# Patient Record
Sex: Male | Born: 1950
Health system: Southern US, Community
[De-identification: ages and names within clinical notes are randomized; demographics above are authoritative.]

## PROBLEM LIST (undated history)

## (undated) DIAGNOSIS — R7303 Prediabetes: Secondary | ICD-10-CM

## (undated) DIAGNOSIS — F411 Generalized anxiety disorder: Secondary | ICD-10-CM

## (undated) DIAGNOSIS — J449 Chronic obstructive pulmonary disease, unspecified: Secondary | ICD-10-CM

## (undated) DIAGNOSIS — I10 Essential (primary) hypertension: Secondary | ICD-10-CM

## (undated) DIAGNOSIS — K219 Gastro-esophageal reflux disease without esophagitis: Secondary | ICD-10-CM

## (undated) DIAGNOSIS — R7301 Impaired fasting glucose: Secondary | ICD-10-CM

## (undated) DIAGNOSIS — Z720 Tobacco use: Secondary | ICD-10-CM

## (undated) DIAGNOSIS — Z9981 Dependence on supplemental oxygen: Secondary | ICD-10-CM

## (undated) DIAGNOSIS — E785 Hyperlipidemia, unspecified: Secondary | ICD-10-CM

## (undated) HISTORY — PX: APPENDECTOMY: SHX54

## (undated) HISTORY — DX: Impaired fasting glucose: R73.01

## (undated) HISTORY — DX: Tobacco use: Z72.0

---

## 2003-04-27 ENCOUNTER — Emergency Department (HOSPITAL_COMMUNITY): Admission: EM | Admit: 2003-04-27 | Discharge: 2003-04-27 | Payer: Self-pay | Admitting: Emergency Medicine

## 2007-05-30 ENCOUNTER — Ambulatory Visit: Payer: Self-pay | Admitting: Cardiology

## 2011-04-02 DIAGNOSIS — R079 Chest pain, unspecified: Secondary | ICD-10-CM | POA: Insufficient documentation

## 2011-04-06 ENCOUNTER — Encounter: Payer: Self-pay | Admitting: *Deleted

## 2011-04-06 ENCOUNTER — Emergency Department (HOSPITAL_COMMUNITY)
Admission: EM | Admit: 2011-04-06 | Discharge: 2011-04-06 | Disposition: A | Discharge status: Home / Self Care | Payer: Self-pay | Attending: Emergency Medicine | Admitting: Emergency Medicine

## 2011-04-06 ENCOUNTER — Emergency Department (HOSPITAL_COMMUNITY): Payer: Self-pay

## 2011-04-06 DIAGNOSIS — J4489 Other specified chronic obstructive pulmonary disease: Secondary | ICD-10-CM | POA: Insufficient documentation

## 2011-04-06 DIAGNOSIS — J449 Chronic obstructive pulmonary disease, unspecified: Secondary | ICD-10-CM

## 2011-04-06 DIAGNOSIS — IMO0001 Reserved for inherently not codable concepts without codable children: Secondary | ICD-10-CM | POA: Insufficient documentation

## 2011-04-06 DIAGNOSIS — F17200 Nicotine dependence, unspecified, uncomplicated: Secondary | ICD-10-CM | POA: Insufficient documentation

## 2011-04-06 DIAGNOSIS — R6889 Other general symptoms and signs: Secondary | ICD-10-CM | POA: Insufficient documentation

## 2011-04-06 DIAGNOSIS — R05 Cough: Secondary | ICD-10-CM

## 2011-04-06 DIAGNOSIS — R509 Fever, unspecified: Secondary | ICD-10-CM | POA: Insufficient documentation

## 2011-04-06 DIAGNOSIS — R059 Cough, unspecified: Secondary | ICD-10-CM | POA: Insufficient documentation

## 2011-04-06 DIAGNOSIS — J3489 Other specified disorders of nose and nasal sinuses: Secondary | ICD-10-CM | POA: Insufficient documentation

## 2011-04-06 DIAGNOSIS — R070 Pain in throat: Secondary | ICD-10-CM | POA: Insufficient documentation

## 2011-04-06 MED ORDER — GUAIFENESIN-CODEINE 100-10 MG/5ML PO SYRP: ORAL_SOLUTION | ORAL | Status: DC

## 2011-04-06 MED ORDER — HYDROCOD POLST-CHLORPHEN POLST 10-8 MG/5ML PO LQCR
5.0000 mL | Freq: Once | ORAL | Status: AC
  Administered 2011-04-06: 5 mL via ORAL
  Filled 2011-04-06: qty 5

## 2011-04-06 NOTE — ED Notes (Signed)
Per wife, pt c/o cough and nasal congestion x 2 weeks; has been using OTC cold medication for this with no relief; c/o onset of chills and body aches yesterday.

## 2011-04-06 NOTE — ED Notes (Signed)
Pt states cold like symptoms started yesterday.  Denies N/V, fever and chills. Pt has a dry croupy type cough present. Reports having yellow sputum infrequently. Pt's wife states pt has had congestion and cold like symptoms x 2 weeks and taking OTC products without success.  Pt states has generalized body aches, denies pain.

## 2011-04-06 NOTE — ED Provider Notes (Signed)
History     CSN: [id number] Arrival date & time: 04/06/2011  4:07 PM   First MD Initiated Contact with Patient 04/06/11 1608      Chief Complaint  Patient presents with  . Generalized Body Aches  . Chills  . Cough    (Consider location/radiation/quality/duration/timing/severity/associated sxs/prior treatment) HPI Comments: Pt states he's had URI sxs x 3 weeks but developed a NP cough yest..  Also, mild sore throat from "coughing so much and diffuse body aches".  Tmax 99.9.  Patient is a 60 y.o. male presenting with cough. The history is provided by the patient. No language interpreter was used.  Cough This is a new problem. The current episode started yesterday. The problem occurs every few minutes. The problem has not changed since onset.The cough is non-productive. There has been no fever. Associated symptoms include rhinorrhea, sore throat and myalgias. Pertinent negatives include no weight loss, no shortness of breath and no wheezing. He has tried nothing for the symptoms. He is a smoker. His past medical history is significant for COPD and emphysema. His past medical history does not include bronchitis, pneumonia, bronchiectasis or asthma.    History reviewed. No pertinent past medical history.  Past Surgical History  Procedure Date  . Appendectomy     No family history on file.  History  Substance Use Topics  . Smoking status: Current Everyday Smoker -- 0.5 packs/day    Types: Cigarettes  . Smokeless tobacco: Not on file  . Alcohol Use: No      Review of Systems  Constitutional: Positive for fever. Negative for weight loss.  HENT: Positive for sore throat, rhinorrhea and sneezing.   Respiratory: Positive for cough. Negative for shortness of breath and wheezing.   Musculoskeletal: Positive for myalgias.  All other systems reviewed and are negative.    Allergies  Review of patient's allergies indicates no known allergies.  Home Medications   Current  Outpatient Rx  Name Route Sig Dispense Refill  . ONE-A-DAY MENS PO TABS Oral Take 1 tablet by mouth daily.      Marland Kitchen PHENYLEPH-DOXYL-DM-ASPIRIN 7.8-6.25-10-500 MG PO TBEF Oral Take 1 tablet by mouth daily as needed. For cold symptoms     . NYQUIL PO Oral Take 15 mLs by mouth at bedtime as needed. Four cold and cough       BP 151/96  Pulse 93  Temp(Src) 98.9 F (37.2 C) (Oral)  Resp 20  Ht 5' 6.5" (1.689 m)  Wt 120 lb (54.432 kg)  BMI 19.08 kg/m2  SpO2 98%  Physical Exam  Nursing note and vitals reviewed. Constitutional: He is oriented to person, place, and time. He appears well-developed and well-nourished.  HENT:  Head: Normocephalic and atraumatic.  Right Ear: Hearing and tympanic membrane normal.  Left Ear: Hearing and tympanic membrane normal.  Mouth/Throat: Oropharynx is clear and moist and mucous membranes are normal. Abnormal dentition. No dental abscesses or uvula swelling. No oropharyngeal exudate, posterior oropharyngeal edema, posterior oropharyngeal erythema or tonsillar abscesses.  Eyes: EOM are normal.  Neck: Normal range of motion.  Cardiovascular: Normal rate, regular rhythm, normal heart sounds and intact distal pulses.   Pulmonary/Chest: Effort normal and breath sounds normal. No accessory muscle usage. Not tachypneic. No respiratory distress. He has no decreased breath sounds. He has no wheezes. He has no rhonchi. He has no rales. He exhibits no tenderness.  Abdominal: Soft. He exhibits no distension. There is no tenderness.  Musculoskeletal: Normal range of motion.  Neurological: He is alert  and oriented to person, place, and time.  Skin: Skin is warm and dry.  Psychiatric: He has a normal mood and affect. Judgment normal.    ED Course  Procedures (including critical care time)  Labs Reviewed - No data to display Dg Chest 2 View  04/06/2011  *RADIOLOGY REPORT*  Clinical Data: Cough, smoker.  CHEST - 2 VIEW  Comparison: [place] study 03/27/2010  Findings:  There is hyperinflation of the lungs compatible with COPD.  Heart and mediastinal contours are within normal limits.  No focal opacities or effusions.  No acute bony abnormality.  IMPRESSION: COPD.  No active disease.  Original Report Authenticated By: Cyndie Chime, M.D.     No diagnosis found.    MDM          Worthy Rancher, PA 04/06/11 [phone]

## 2011-04-07 NOTE — ED Provider Notes (Signed)
Medical screening examination/treatment/procedure(s) were performed by non-physician practitioner and as supervising physician I was immediately available for consultation/collaboration.   Renne Platts L Marquiz Sotelo, MD 04/07/11 0002 

## 2011-04-11 ENCOUNTER — Encounter (HOSPITAL_COMMUNITY): Payer: Self-pay | Admitting: *Deleted

## 2011-04-11 ENCOUNTER — Other Ambulatory Visit: Payer: Self-pay

## 2011-04-11 ENCOUNTER — Emergency Department (HOSPITAL_COMMUNITY)
Admission: EM | Admit: 2011-04-11 | Discharge: 2011-04-12 | Disposition: A | Discharge status: Home / Self Care | Payer: Self-pay | Attending: Emergency Medicine | Admitting: Emergency Medicine

## 2011-04-11 DIAGNOSIS — R531 Weakness: Secondary | ICD-10-CM

## 2011-04-11 DIAGNOSIS — I10 Essential (primary) hypertension: Secondary | ICD-10-CM | POA: Insufficient documentation

## 2011-04-11 DIAGNOSIS — R5381 Other malaise: Secondary | ICD-10-CM | POA: Insufficient documentation

## 2011-04-11 DIAGNOSIS — J4489 Other specified chronic obstructive pulmonary disease: Secondary | ICD-10-CM | POA: Insufficient documentation

## 2011-04-11 DIAGNOSIS — M549 Dorsalgia, unspecified: Secondary | ICD-10-CM | POA: Insufficient documentation

## 2011-04-11 DIAGNOSIS — R109 Unspecified abdominal pain: Secondary | ICD-10-CM | POA: Insufficient documentation

## 2011-04-11 DIAGNOSIS — J449 Chronic obstructive pulmonary disease, unspecified: Secondary | ICD-10-CM | POA: Insufficient documentation

## 2011-04-11 DIAGNOSIS — R05 Cough: Secondary | ICD-10-CM | POA: Insufficient documentation

## 2011-04-11 DIAGNOSIS — R059 Cough, unspecified: Secondary | ICD-10-CM | POA: Insufficient documentation

## 2011-04-11 DIAGNOSIS — F17200 Nicotine dependence, unspecified, uncomplicated: Secondary | ICD-10-CM | POA: Insufficient documentation

## 2011-04-11 LAB — DIFFERENTIAL
Basophils Absolute: 0.1 10*3/uL (ref 0.0–0.1)
Basophils Relative: 1 % (ref 0–1)
Eosinophils Absolute: 0.1 10*3/uL (ref 0.0–0.7)
Eosinophils Relative: 2 % (ref 0–5)
Lymphocytes Relative: 32 % (ref 12–46)
Lymphs Abs: 1.8 10*3/uL (ref 0.7–4.0)
Monocytes Absolute: 0.7 10*3/uL (ref 0.1–1.0)
Monocytes Relative: 12 % (ref 3–12)
Neutro Abs: 2.9 10*3/uL (ref 1.7–7.7)
Neutrophils Relative %: 53 % (ref 43–77)

## 2011-04-11 LAB — BASIC METABOLIC PANEL
BUN: 13 mg/dL (ref 6–23)
CO2: 28 mEq/L (ref 19–32)
Calcium: 9.9 mg/dL (ref 8.4–10.5)
Chloride: 100 mEq/L (ref 96–112)
Creatinine, Ser: 0.69 mg/dL (ref 0.50–1.35)
GFR calc Af Amer: >90 mL/min (ref >90)
GFR calc non Af Amer: >90 mL/min (ref >90)
Glucose, Bld: 119 mg/dL — ABNORMAL HIGH (ref 70–99)
Potassium: 4.1 mEq/L (ref 3.5–5.1)
Sodium: 137 mEq/L (ref 135–145)

## 2011-04-11 LAB — POCT I-STAT TROPONIN I: Troponin i, poc: 0 ng/mL (ref 0.00–0.08)

## 2011-04-11 LAB — CBC
HCT: 42.1 % (ref 39.0–52.0)
Hemoglobin: 14.2 g/dL (ref 13.0–17.0)
MCH: 28.9 pg (ref 26.0–34.0)
MCHC: 33.7 g/dL (ref 30.0–36.0)
MCV: 85.6 fL (ref 78.0–100.0)
Platelets: 210 10*3/uL (ref 150–400)
RBC: 4.92 MIL/uL (ref 4.22–5.81)
RDW: 14.1 % (ref 11.5–15.5)
WBC: 5.6 10*3/uL (ref 4.0–10.5)

## 2011-04-11 MED ORDER — ALBUTEROL SULFATE HFA 108 (90 BASE) MCG/ACT IN AERS
2.0000 | INHALATION_SPRAY | RESPIRATORY_TRACT | Status: DC | PRN
  Administered 2011-04-12: 2 via RESPIRATORY_TRACT
  Filled 2011-04-11: qty 6.7

## 2011-04-11 NOTE — ED Notes (Signed)
Nausea, dizzy, onset today.

## 2011-04-11 NOTE — ED Provider Notes (Signed)
History   This chart was scribed for Joya Gaskins, MD scribed by Magnus Sinning. The patient was seen in room APA19/APA19    CSN: [id number] Arrival date & time: 04/11/2011  9:10 PM   First MD Initiated Contact with Patient 04/11/11 2130      Chief Complaint  Patient presents with  . Nausea    Patient is a 60 y.o. male presenting with abdominal pain. The history is provided by the patient and the spouse. No language interpreter was used.  Abdominal Pain The primary symptoms of the illness include abdominal pain. The current episode started more than 2 days ago. The onset of the illness was gradual. The problem has been gradually worsening.  Additional symptoms associated with the illness include back pain (Experienced w/ coughing).  Jeffery [name] is a 60 y.o. male who presents to the Emergency Department complaining of intermittitent abdominal pain associated w/ cough, loss of appetite, subjective fever,and light-headed dizziness, but reports that he still been able to drive and maintain activity. Pt reports that onset was five days ago after being seen in ED for a dry croupy cough. Pt was dx'd at last visit with COPD exacerbation and was prescribed codeine cough syrup.  No active CP reported  History reviewed. No pertinent past medical history. COPD  Past Surgical History  Procedure Date  . Appendectomy     History reviewed. No pertinent family history. History  Substance Use Topics  . Smoking status: Current Everyday Smoker -- 0.5 packs/day    Types: Cigarettes  . Smokeless tobacco: Not on file  . Alcohol Use: No     Review of Systems  Gastrointestinal: Positive for abdominal pain.  Musculoskeletal: Positive for back pain (Experienced w/ coughing).  Neurological: Negative for headaches.  All other systems reviewed and are negative.    Allergies  Review of patient's allergies indicates no known allergies.  Home Medications   Current Outpatient Rx  Name  Route Sig Dispense Refill  . GUAIFENESIN-CODEINE 100-10 MG/5ML PO SYRP  5-10 ml po q 4-6 hrs prn cough 240 mL 0  . ONE-A-DAY MENS PO TABS Oral Take 1 tablet by mouth daily.        BP 136/102  Pulse 73  Temp(Src) 98.2 F (36.8 C) (Oral)  Resp 16  Ht 5' 6.5" (1.689 m)  Wt 120 lb (54.432 kg)  BMI 19.08 kg/m2  SpO2 99%  Physical Exam CONSTITUTIONAL: Well developed/well nourished HEAD AND FACE: Normocephalic/atraumatic EYES: EOMI/PERRL ENMT: Mucous membranes moist NECK: supple no meningeal signs CV: S1/S2 noted, no murmurs/rubs/gallops noted LUNGS: Lungs are clear to auscultation bilaterally, no apparent distress ABDOMEN: soft, nontender, no rebound or guarding GU:no cva tenderness NEURO: Pt is awake/alert, moves all extremitiesx4 No focal motor deficit is noted EXTREMITIES: pulses normal, full ROM SKIN: warm, color normal PSYCH: no abnormalities of mood noted ED Course  Procedures  Oxygen Saturation is 99% on room air, normal by my interpretation.    COORDINATION OF CARE:  12:00 AM Pt improved He denied CP/diaphoresis He reports he felt nausea after taking the cough syrup with codeine, and he continued to cough He did have abnormal EKG, though no old to compare.  I offered him admission for his generalized weakness and abnormal EKG but he would rather go home.  He would like an albuterol mdi.  I advised him to quit smoking and f/u for his HTN.  I discussed strict return precautions He is agreeable with this plan   MDM  Nursing notes reviewed  and considered in documentation All labs/vitals reviewed and considered Previous records reviewed and considered   Date: 04/11/2011  Rate: 71  Rhythm: normal sinus rhythm  QRS Axis: normal  Intervals: normal  ST/T Wave abnormalities: inverted T waves  Conduction Disutrbances:none  Narrative Interpretation:   Old EKG Reviewed: none available   I personally performed the services described in this documentation, which was  scribed in my presence. The recorded information has been reviewed and considered.           Joya Gaskins, MD 04/12/11 [phone]

## 2011-04-12 ENCOUNTER — Other Ambulatory Visit: Payer: Self-pay

## 2011-04-12 ENCOUNTER — Encounter (HOSPITAL_COMMUNITY): Payer: Self-pay

## 2011-04-12 ENCOUNTER — Inpatient Hospital Stay (HOSPITAL_COMMUNITY)
Admission: EM | Admit: 2011-04-12 | Discharge: 2011-04-13 | DRG: 313 | Disposition: A | Discharge status: Home / Self Care | Payer: 59 | Source: Ambulatory Visit | Attending: Cardiology | Admitting: Cardiology

## 2011-04-12 DIAGNOSIS — E876 Hypokalemia: Secondary | ICD-10-CM | POA: Diagnosis present

## 2011-04-12 DIAGNOSIS — R5383 Other fatigue: Secondary | ICD-10-CM

## 2011-04-12 DIAGNOSIS — I10 Essential (primary) hypertension: Secondary | ICD-10-CM | POA: Diagnosis present

## 2011-04-12 DIAGNOSIS — R0789 Other chest pain: Principal | ICD-10-CM | POA: Diagnosis present

## 2011-04-12 DIAGNOSIS — R7309 Other abnormal glucose: Secondary | ICD-10-CM | POA: Diagnosis present

## 2011-04-12 DIAGNOSIS — R110 Nausea: Secondary | ICD-10-CM | POA: Diagnosis present

## 2011-04-12 DIAGNOSIS — R05 Cough: Secondary | ICD-10-CM | POA: Diagnosis present

## 2011-04-12 DIAGNOSIS — R109 Unspecified abdominal pain: Secondary | ICD-10-CM | POA: Diagnosis present

## 2011-04-12 DIAGNOSIS — R059 Cough, unspecified: Secondary | ICD-10-CM | POA: Diagnosis present

## 2011-04-12 DIAGNOSIS — F17200 Nicotine dependence, unspecified, uncomplicated: Secondary | ICD-10-CM | POA: Diagnosis present

## 2011-04-12 DIAGNOSIS — J069 Acute upper respiratory infection, unspecified: Secondary | ICD-10-CM | POA: Diagnosis present

## 2011-04-12 DIAGNOSIS — R9431 Abnormal electrocardiogram [ECG] [EKG]: Secondary | ICD-10-CM

## 2011-04-12 HISTORY — DX: Essential (primary) hypertension: I10

## 2011-04-12 LAB — COMPREHENSIVE METABOLIC PANEL
ALT: 11 U/L (ref 0–53)
AST: 15 U/L (ref 0–37)
Albumin: 3.9 g/dL (ref 3.5–5.2)
Alkaline Phosphatase: 104 U/L (ref 39–117)
BUN: 14 mg/dL (ref 6–23)
CO2: 27 mEq/L (ref 19–32)
Calcium: 9.8 mg/dL (ref 8.4–10.5)
Chloride: 101 mEq/L (ref 96–112)
Creatinine, Ser: 0.79 mg/dL (ref 0.50–1.35)
GFR calc Af Amer: >90 mL/min (ref >90)
GFR calc non Af Amer: >90 mL/min (ref >90)
Glucose, Bld: 102 mg/dL — ABNORMAL HIGH (ref 70–99)
Potassium: 3.9 mEq/L (ref 3.5–5.1)
Sodium: 138 mEq/L (ref 135–145)
Total Bilirubin: 0.6 mg/dL (ref 0.3–1.2)
Total Protein: 7.4 g/dL (ref 6.0–8.3)

## 2011-04-12 LAB — URINALYSIS, ROUTINE W REFLEX MICROSCOPIC
Glucose, UA: NEGATIVE mg/dL
Hgb urine dipstick: NEGATIVE
Ketones, ur: 15 mg/dL — AB
Leukocytes, UA: NEGATIVE
Nitrite: NEGATIVE
Protein, ur: NEGATIVE mg/dL
Specific Gravity, Urine: >1.03 — ABNORMAL HIGH (ref 1.005–1.030)
Urobilinogen, UA: 0.2 mg/dL (ref 0.0–1.0)
pH: 6 (ref 5.0–8.0)

## 2011-04-12 LAB — CBC
HCT: 41 % (ref 39.0–52.0)
Hemoglobin: 13.4 g/dL (ref 13.0–17.0)
MCH: 28.1 pg (ref 26.0–34.0)
MCHC: 32.7 g/dL (ref 30.0–36.0)
MCV: 86 fL (ref 78.0–100.0)
Platelets: 197 10*3/uL (ref 150–400)
RBC: 4.77 MIL/uL (ref 4.22–5.81)
RDW: 13.9 % (ref 11.5–15.5)
WBC: 5.7 10*3/uL (ref 4.0–10.5)

## 2011-04-12 LAB — D-DIMER, QUANTITATIVE (NOT AT ARMC): D-Dimer, Quant: 0.44 ug/mL-FEU (ref 0.00–0.48)

## 2011-04-12 LAB — LIPASE, BLOOD: Lipase: 6 U/L — ABNORMAL LOW (ref 11–59)

## 2011-04-12 LAB — TROPONIN I
Troponin I: <0.3 ng/mL (ref <0.30)
Troponin I: <0.3 ng/mL (ref <0.30)

## 2011-04-12 MED ORDER — ASPIRIN 81 MG PO CHEW
324.0000 mg | CHEWABLE_TABLET | Freq: Once | ORAL | Status: AC
  Administered 2011-04-12: 324 mg via ORAL
  Filled 2011-04-12: qty 4

## 2011-04-12 NOTE — ED Notes (Signed)
Pt presents with abdominal pain and hypertension. Pt was seen here last night and d/c but is not improving.

## 2011-04-12 NOTE — ED Notes (Signed)
Called [facility]. Was advised by charge nurse he was going to speak w/ dr Preston Fleeting stating pt could not stay in [facility]. Awaiting a return call at this time.

## 2011-04-12 NOTE — ED Notes (Signed)
Dr Juleen China back in to speak with patient and family about plan of care.

## 2011-04-12 NOTE — ED Notes (Signed)
Patient c/o left rib pain exacerbated by coughing. Patient reports hypertension since starting codeine cough syrup on 12/5. Patient discharged home last night around midnight and told to return if not any better. Patient states he is not better today. NAD noted at present. Respirations even and unlabored, patient is able to ambulate with steady gait. No cough noted upon assessment.

## 2011-04-12 NOTE — ED Notes (Signed)
Patient's wife contact information: Jeffery [name]; phone number: [phone]

## 2011-04-12 NOTE — ED Notes (Signed)
Dr Juleen China at bedside for pt evaluation.

## 2011-04-12 NOTE — ED Provider Notes (Signed)
History   This chart was scribed for Raeford Razor, MD by Clarita Crane. The patient was seen in room APA07/APA07 and the patient's care was started at 3:22PM.   CSN: [id number] Arrival date & time: 04/12/2011  2:45 PM   First MD Initiated Contact with Patient 04/12/11 1517      Chief Complaint  Patient presents with  .   Marland Kitchen Abdominal Pain    (Consider location/radiation/quality/duration/timing/severity/associated sxs/prior treatment) HPI Jeffery [name] is a 60 y.o. male who presents to the Emergency Department complaining of constant moderate dysphagia onset 2 weeks ago and persistent since with associated decreased appetite, cough and chest soreness with coughing. Patient states he will attempt to swallow food but is unable to completely swallow the food bolus and, as a result, has been unable to consume a normal meal for the past several days. Additionally, patient notes experiencing mild to moderate epigastric abdominal pain for the past several days and a singular episode of a warm tingling sensation to left lower leg today which lasted several seconds before resolving on its own. Reports he has been evaluated in ED 2x within the past 2 weeks for similar abdominal pain, most recently last night and was d/c home and advised to return to ED should the symptoms persist. Patient denies pain with swallowing, SOB, nausea, vomiting. Patient is a former smoker and reports h/o appendectomy.  No PCP currently  History reviewed. No pertinent past medical history.  Past Surgical History  Procedure Date  . Appendectomy     No family history on file.  History  Substance Use Topics  . Smoking status: Current Everyday Smoker -- 0.5 packs/day    Types: Cigarettes  . Smokeless tobacco: Not on file  . Alcohol Use: No      Review of Systems 10 Systems reviewed and are negative for acute change except as noted in the HPI.  Allergies  Review of patient's allergies indicates no known  allergies.  Home Medications   Current Outpatient Rx  Name Route Sig Dispense Refill  . ALBUTEROL SULFATE HFA 108 (90 BASE) MCG/ACT IN AERS Inhalation Inhale 2 puffs into the lungs every 4 (four) hours as needed. For shortness of breath     . ONE-A-DAY MENS PO TABS Oral Take 1 tablet by mouth daily.        BP 145/106  Pulse 82  Temp(Src) 97.7 F (36.5 C) (Oral)  Resp 20  Ht 5' 6.5" (1.689 m)  Wt 120 lb (54.432 kg)  BMI 19.08 kg/m2  SpO2 100%  Physical Exam  Nursing note and vitals reviewed. Constitutional: He is oriented to person, place, and time. He appears well-developed and well-nourished. No distress.  HENT:  Head: Normocephalic and atraumatic.  Mouth/Throat: Oropharynx is clear and moist. No oropharyngeal exudate.       Poor dentition.   Eyes: EOM are normal. Pupils are equal, round, and reactive to light.  Neck: Neck supple. No tracheal deviation present. No thyromegaly present.  Cardiovascular: Normal rate and regular rhythm.  Exam reveals no gallop and no friction rub.   No murmur heard.      DP pulses intact.   Pulmonary/Chest: Effort normal. No respiratory distress. He has no wheezes.  Abdominal: Soft. He exhibits no distension. There is no tenderness. There is no rebound and no guarding.  Musculoskeletal: Normal range of motion. He exhibits no edema.  Lymphadenopathy:    He has no cervical adenopathy.  Neurological: He is alert and oriented to person, place, and time.  No cranial nerve deficit or sensory deficit.       Bilateral upper and lower extremity strength normal and equal. Distal sensation intact.   Skin: Skin is warm and dry.  Psychiatric: He has a normal mood and affect. His behavior is normal.    ED Course  Procedures (including critical care time)  DIAGNOSTIC STUDIES: Oxygen Saturation is 100% on room air, normal by my interpretation.    COORDINATION OF CARE: 5:27PM- Patient informed of changes observed between EKG taken today and yesterday and  recommendation for admission to hospital. Patient agrees with plan set forth at this time.  5:49PM- Patient's spouse informed of changes observed in EKG taken today as compared to that taken yesterday and recommend admission. 6:05PM- Consult complete with hospitalist. Patient case explained and discussed. Hospitalist recommends consult with [facility] at [facility] for [facility]. 6:16PM- Consult complete with cardiology at [facility]. Patient case explained and discussed. Cardiologist agrees to accept patient for further evaluation and treatment.  Results for orders placed during the hospital encounter of 04/12/11  COMPREHENSIVE METABOLIC PANEL      Component Value Range   Sodium 138  135 - 145 (mEq/L)   Potassium 3.9  3.5 - 5.1 (mEq/L)   Chloride 101  96 - 112 (mEq/L)   CO2 27  19 - 32 (mEq/L)   Glucose, Bld 102 (*) 70 - 99 (mg/dL)   BUN 14  6 - 23 (mg/dL)   Creatinine, Ser 4.09  0.50 - 1.35 (mg/dL)   Calcium 9.8  8.4 - 81.1 (mg/dL)   Total Protein 7.4  6.0 - 8.3 (g/dL)   Albumin 3.9  3.5 - 5.2 (g/dL)   AST 15  0 - 37 (U/L)   ALT 11  0 - 53 (U/L)   Alkaline Phosphatase 104  39 - 117 (U/L)   Total Bilirubin 0.6  0.3 - 1.2 (mg/dL)   GFR calc non Af Amer >90  >90 (mL/min)   GFR calc Af Amer >90  >90 (mL/min)  CBC      Component Value Range   WBC 5.7  4.0 - 10.5 (K/uL)   RBC 4.77  4.22 - 5.81 (MIL/uL)   Hemoglobin 13.4  13.0 - 17.0 (g/dL)   HCT 91.4  78.2 - 95.6 (%)   MCV 86.0  78.0 - 100.0 (fL)   MCH 28.1  26.0 - 34.0 (pg)   MCHC 32.7  30.0 - 36.0 (g/dL)   RDW 21.3  08.6 - 57.8 (%)   Platelets 197  150 - 400 (K/uL)  LIPASE, BLOOD      Component Value Range   Lipase 6 (*) 11 - 59 (U/L)  URINALYSIS, ROUTINE W REFLEX MICROSCOPIC      Component Value Range   Color, Urine YELLOW  YELLOW    APPearance CLEAR  CLEAR    Specific Gravity, Urine >1.030 (*) 1.005 - 1.030    pH 6.0  5.0 - 8.0    Glucose, UA NEGATIVE  NEGATIVE (mg/dL)   Hgb urine dipstick NEGATIVE  NEGATIVE      Bilirubin Urine SMALL (*) NEGATIVE    Ketones, ur 15 (*) NEGATIVE (mg/dL)   Protein, ur NEGATIVE  NEGATIVE (mg/dL)   Urobilinogen, UA 0.2  0.0 - 1.0 (mg/dL)   Nitrite NEGATIVE  NEGATIVE    Leukocytes, UA NEGATIVE  NEGATIVE   TROPONIN I      Component Value Range   Troponin I <0.30  <0.30 (ng/mL)    No results found.  Date: 04/12/2011  Rate:67  Rhythm: normal sinus rhythm  QRS Axis: normal  Intervals: normal  ST/T Wave abnormalities: t-wave invsersions v3-v6 and inferiorly. equivocal biphasic t-waves in v2. some of these changes are new. small, narrow qwaves inferiorly which were not present on EKG yesterday   Conduction Disutrbances:none  Narrative Interpretation:   Old EKG Reviewed: changes noted  6:22 PM Discussed with Dr Riley Kill cardiology. Will transfer to [facility] for further management and care.  8:40 PM Apparently no open beds at [facility]. [facility] until bed available. Discussed briefly with Dr Preston Fleeting, ED physician at [facility] about impending transfer.  1. Fatigue   2. Chest pain   3. EKG abnormalities       MDM  60yM with multiple vague complaints. CP does seem pleuritic in nature, but EKG changes are concerning. More pronounced t wave inversions laterally and also now in v3. qwaves inferiorly. These are small in amplitude and narrow, but still concerning given that they were not present yesterday. Altough these symptoms are atypical for ACS, they are concerning. Discussed with cardiology who accepted in transfer. D-dimer ordered at their request. I think this is reasonable given pleuritic nature of pain and brief pain pt had in [facility] earlier today.       I personally preformed the services scribed in my presence. The recorded information has been reviewed and considered. Raeford Razor, MD.    Raeford Razor, MD 04/12/11 [phone]

## 2011-04-13 ENCOUNTER — Encounter (HOSPITAL_COMMUNITY): Payer: Self-pay | Admitting: Cardiology

## 2011-04-13 ENCOUNTER — Other Ambulatory Visit: Payer: Self-pay

## 2011-04-13 DIAGNOSIS — R079 Chest pain, unspecified: Secondary | ICD-10-CM

## 2011-04-13 DIAGNOSIS — R109 Unspecified abdominal pain: Secondary | ICD-10-CM | POA: Diagnosis present

## 2011-04-13 LAB — DIFFERENTIAL
Basophils Relative: 1 % (ref 0–1)
Eosinophils Relative: 3 % (ref 0–5)
Lymphocytes Relative: 34 % (ref 12–46)
Monocytes Absolute: 0.8 10*3/uL (ref 0.1–1.0)
Monocytes Relative: 13 % — ABNORMAL HIGH (ref 3–12)
Neutrophils Relative %: 49 % (ref 43–77)

## 2011-04-13 LAB — COMPREHENSIVE METABOLIC PANEL
ALT: 10 U/L (ref 0–53)
AST: 16 U/L (ref 0–37)
Albumin: 3.6 g/dL (ref 3.5–5.2)
Alkaline Phosphatase: 97 U/L (ref 39–117)
BUN: 11 mg/dL (ref 6–23)
Chloride: 101 mEq/L (ref 96–112)
Potassium: 3.4 mEq/L — ABNORMAL LOW (ref 3.5–5.1)
Sodium: 138 mEq/L (ref 135–145)
Total Bilirubin: 0.8 mg/dL (ref 0.3–1.2)
Total Protein: 7.3 g/dL (ref 6.0–8.3)

## 2011-04-13 LAB — CARDIAC PANEL(CRET KIN+CKTOT+MB+TROPI)
CK, MB: 2.7 ng/mL (ref 0.3–4.0)
CK, MB: 2.3 ng/mL (ref 0.3–4.0)
Troponin I: <0.3 ng/mL (ref <0.30)
Troponin I: <0.3 ng/mL (ref <0.30)

## 2011-04-13 LAB — GLUCOSE, CAPILLARY
Glucose-Capillary: 122 mg/dL — ABNORMAL HIGH (ref 70–99)
Glucose-Capillary: 105 mg/dL — ABNORMAL HIGH (ref 70–99)
Glucose-Capillary: 116 mg/dL — ABNORMAL HIGH (ref 70–99)

## 2011-04-13 LAB — CBC
Hemoglobin: 13.4 g/dL (ref 13.0–17.0)
MCH: 28.6 pg (ref 26.0–34.0)
Platelets: 197 10*3/uL (ref 150–400)
RBC: 4.68 MIL/uL (ref 4.22–5.81)
WBC: 6.1 10*3/uL (ref 4.0–10.5)

## 2011-04-13 LAB — APTT: aPTT: 34 seconds (ref 24–37)

## 2011-04-13 LAB — PROTIME-INR
INR: 1.14 (ref 0.00–1.49)
Prothrombin Time: 14.8 seconds (ref 11.6–15.2)

## 2011-04-13 MED ORDER — ALBUTEROL SULFATE HFA 108 (90 BASE) MCG/ACT IN AERS
2.0000 | INHALATION_SPRAY | RESPIRATORY_TRACT | Status: DC | PRN
  Filled 2011-04-13: qty 6.7

## 2011-04-13 MED ORDER — ASPIRIN 81 MG PO CHEW: 324.0000 mg | CHEWABLE_TABLET | ORAL | Status: DC

## 2011-04-13 MED ORDER — ASPIRIN 300 MG RE SUPP
300.0000 mg | RECTAL | Status: DC
  Filled 2011-04-13: qty 1

## 2011-04-13 MED ORDER — METOPROLOL TARTRATE 12.5 MG HALF TABLET
12.5000 mg | ORAL_TABLET | Freq: Two times a day (BID) | ORAL | Status: DC
  Filled 2011-04-13 (×2): qty 1

## 2011-04-13 MED ORDER — SODIUM CHLORIDE 0.9 % IV SOLN
INTRAVENOUS | Status: AC
  Administered 2011-04-13: 10 mL via INTRAVENOUS

## 2011-04-13 MED ORDER — POTASSIUM CHLORIDE CRYS ER 20 MEQ PO TBCR
20.0000 meq | EXTENDED_RELEASE_TABLET | Freq: Once | ORAL | Status: AC
  Administered 2011-04-13: 20 meq via ORAL
  Filled 2011-04-13: qty 1

## 2011-04-13 MED ORDER — HEPARIN SODIUM (PORCINE) 5000 UNIT/ML IJ SOLN
5000.0000 [IU] | Freq: Three times a day (TID) | INTRAMUSCULAR | Status: DC
  Administered 2011-04-13 (×2): 5000 [IU] via SUBCUTANEOUS
  Filled 2011-04-13 (×4): qty 1

## 2011-04-13 MED ORDER — NITROGLYCERIN 0.4 MG SL SUBL: 0.4000 mg | SUBLINGUAL_TABLET | SUBLINGUAL | Status: DC | PRN

## 2011-04-13 MED ORDER — ACETAMINOPHEN 325 MG PO TABS: 650.0000 mg | ORAL_TABLET | ORAL | Status: DC | PRN

## 2011-04-13 MED ORDER — METOPROLOL SUCCINATE ER 25 MG PO TB24: 25.0000 mg | ORAL_TABLET | Freq: Every day | ORAL | Status: DC

## 2011-04-13 MED ORDER — ONDANSETRON HCL 4 MG/2ML IJ SOLN: 4.0000 mg | Freq: Four times a day (QID) | INTRAMUSCULAR | Status: DC | PRN

## 2011-04-13 MED ORDER — ONDANSETRON HCL 4 MG/2ML IJ SOLN: 4.0000 mg | Freq: Three times a day (TID) | INTRAMUSCULAR | Status: DC | PRN

## 2011-04-13 MED ORDER — ENSURE CLINICAL ST REVIGOR PO LIQD: 237.0000 mL | Freq: Two times a day (BID) | ORAL | Status: DC

## 2011-04-13 MED ORDER — METOPROLOL TARTRATE 12.5 MG HALF TABLET
12.5000 mg | ORAL_TABLET | Freq: Two times a day (BID) | ORAL | Status: DC
  Administered 2011-04-13: 12.5 mg via ORAL
  Filled 2011-04-13 (×2): qty 1

## 2011-04-13 MED ORDER — ASPIRIN EC 81 MG PO TBEC: 81.0000 mg | DELAYED_RELEASE_TABLET | Freq: Every day | ORAL | Status: DC

## 2011-04-13 MED ORDER — INFLUENZA VIRUS VACC SPLIT PF IM SUSP
0.5000 mL | INTRAMUSCULAR | Status: AC
  Administered 2011-04-13: 0.5 mL via INTRAMUSCULAR
  Filled 2011-04-13 (×2): qty 0.5

## 2011-04-13 MED ORDER — PNEUMOCOCCAL VAC POLYVALENT 25 MCG/0.5ML IJ INJ: 0.5000 mL | INJECTION | INTRAMUSCULAR | Status: DC

## 2011-04-13 NOTE — Discharge Summary (Signed)
   CARDIOLOGY DISCHARGE SUMMARY   Patient ID: Ismar Yabut MRN: [id number] DOB/AGE: 1950-08-14 60 y.o.  Admit date: 04/12/2011 Discharge date: 04/13/2011  Primary Discharge Diagnosis: chest pain Secondary Discharge Diagnosis:  Patient Active Problem List  Diagnoses   Tobacco use   Hyperglycemia (HgbA1C 6.0)   hypokalemia  . Abdominal  pain, other specified site    Significant Diagnostic Studies: 2D echocardiogram, 2-view CXR  Hospital Course: Mr Hehl is a 60 yo male with no Hx CAD. He had chest pain and came to the hospital where he was admitted for further evaluation and treatment.  His enzymes were negative for MI. An echocardiogram is pending. His CP resolved. It was atypical and medical therapy with PRN Rx was recommended. His potassium was supplemented.  On 04/13/2011, Mr Fults was evaluated by Dr Jens Som and considered stable for D/C if his echo shows a normal EF, to f/u in [place] with a stress-echo.  Labs:  Lab Results  Component Value Date   WBC 6.1 04/13/2011   HGB 13.4 04/13/2011   HCT 39.3 04/13/2011   MCV 84.0 04/13/2011   PLT 197 04/13/2011    Lab 04/13/11 0540  NA 138  K 3.4*  CL 101  CO2 25  BUN 11  CREATININE 0.64  CALCIUM 9.3  PROT 7.3  BILITOT 0.8  ALKPHOS 97  ALT 10  AST 16  GLUCOSE 95   Lab Results  Component Value Date   CKTOTAL 91 04/13/2011   CKMB 2.3 04/13/2011   TROPONINI <0.30 04/13/2011    No results found for this basename: CHOL   No results found for this basename: HDL   No results found for this basename: LDLCALC   No results found for this basename: TRIG   No results found for this basename: CHOLHDL   No results found for this basename: LDLDIRECT      Radiology: IMPRESSION: COPD. No active disease.  EKG:  Normal sinus rhythm Possible Left atrial enlargement T wave abnormality, consider anterior ischemia Abnormal ECG Vent. rate 70 BPM PR interval 160 ms QRS duration 92 ms QT/QTc 406/438 ms P-R-T  axes 78 70 69   FOLLOW UP PLANS AND APPOINTMENTS Discharge Orders    Future Appointments: Provider: Department: Dept Phone: Center:   04/19/2011 10:30 AM Ap-Cardiopul Echo Lab [facility]-Cardiopulmonary Svc  None     Current Discharge Medication List    START taking these medications   Details  metoprolol succinate (TOPROL XL) 25 MG 24 hr tablet Take 1 tablet (25 mg total) by mouth daily. Qty: 30 tablet, Refills: 11      CONTINUE these medications which have NOT CHANGED   Details  albuterol (VENTOLIN HFA) 108 (90 BASE) MCG/ACT inhaler Inhale 2 puffs into the lungs every 4 (four) hours as needed. For shortness of breath     multivitamin (ONE-A-DAY MEN'S) TABS Take 1 tablet by mouth daily.         BRING ALL MEDICATIONS WITH YOU TO FOLLOW UP APPOINTMENTS  Time spent with patient to include physician time:  Signed: Theodore Demark 04/13/2011, 1:53 PM Co-Sign MD  Addendum: Echo read and OK. D/C today and f/u in [place] as arranged.

## 2011-04-13 NOTE — ED Notes (Signed)
Spoke with [facility] stated that they are in route to transfer the patient.

## 2011-04-13 NOTE — Progress Notes (Signed)
INITIAL ADULT NUTRITION ASSESSMENT Date: 04/13/2011   Time: 8:23 AM  Reason for Assessment: MD consult  ASSESSMENT: Male 60 y.o.  Dx: abdominal pain with chest cough  Hx:  Past Medical History  Diagnosis Date  . Shortness of breath   . Hypertension    Related Meds:  Scheduled Meds:   . sodium chloride   Intravenous STAT  . aspirin  324 mg Oral Once  . aspirin  324 mg Oral NOW   Or  . aspirin  300 mg Rectal NOW  . aspirin EC  81 mg Oral Daily  . heparin  5,000 Units Subcutaneous Q8H  . influenza  inactive virus vaccine  0.5 mL Intramuscular Tomorrow-1000  . metoprolol tartrate  12.5 mg Oral BID  . pneumococcal 23 valent vaccine  0.5 mL Intramuscular Tomorrow-1000  . potassium chloride  20 mEq Oral Once  . DISCONTD: metoprolol tartrate  12.5 mg Oral BID   Continuous Infusions:  PRN Meds:.acetaminophen, albuterol, nitroGLYCERIN, ondansetron (ZOFRAN) IV, DISCONTD: ondansetron (ZOFRAN) IV  Ht: 5\' 6"  (167.6 cm)  Wt: 107 lb (48.535 kg)  Ideal Wt: 64.5 kg % Ideal Wt: 75%  Usual Wt: 125 lb (56.8 kg) % Usual Wt: 85%  Body mass index is 17.27 kg/(m^2). Pt is underweight.  Food/Nutrition Related Hx: poor PO intake when sick  Labs:  CMP     Component Value Date/Time   NA 138 04/13/2011 0540   K 3.4* 04/13/2011 0540   CL 101 04/13/2011 0540   CO2 25 04/13/2011 0540   GLUCOSE 95 04/13/2011 0540   BUN 11 04/13/2011 0540   CREATININE 0.64 04/13/2011 0540   CALCIUM 9.3 04/13/2011 0540   PROT 7.3 04/13/2011 0540   ALBUMIN 3.6 04/13/2011 0540   AST 16 04/13/2011 0540   ALT 10 04/13/2011 0540   ALKPHOS 97 04/13/2011 0540   BILITOT 0.8 04/13/2011 0540   GFRNONAA >90 04/13/2011 0540   GFRAA >90 04/13/2011 0540   Intake/Output: I/O last 3 completed shifts: In: -  Out: 50 [Urine:50]    Diet Order: CHO Modified Medium  Supplements/Tube Feeding: none  IVF:    Estimated Nutritional Needs:   Kcal:  1600 - 1800 Protein:  70 - 90 Fluid:  1.6 - 1.8 L/d  Pt  reports usual weight is 56.8kg, but unable to state when he last weighed this amount. This is a 15% wt loss. Meets criteria for moderate malnutrition in the context of chronic illness given wt loss, current underweight status, and report of poor PO intake when "sick." Pt agreeable to oral supplements. Appetite is good at this time. Ate >75% of breakfast.  NUTRITION DIAGNOSIS: -Predicted suboptimal energy intake (NI-1.6).  Status: Ongoing  RELATED TO: poor PO intake PTA when "sick"  AS EVIDENCE BY: pt report of declining weight, current underweight BMI.  MONITORING/EVALUATION(Goals): Goal: Pt to consume > 75% of meals and supplements. Monitor: PO intake, weights, labs  EDUCATION NEEDS: -Education needs addressed  INTERVENTION: 1. Discussed importance in maximizing protein and kcal intake to promote wellness and weight maintenance. 2. Ensure Clinical Strength PO BID 3. RD to follow nutrition care plan  Dietitian #: [phone]  DOCUMENTATION CODES Per approved criteria  -Underweight -Moderate malnutrition in the context of acute illness    Adair Laundry 04/13/2011, 8:23 AM

## 2011-04-13 NOTE — ED Notes (Signed)
Call made to bed control, was advised we are still awaiting bed assignment at thit time. Having to move pt & clean room before transfer.

## 2011-04-13 NOTE — Progress Notes (Signed)
Patient being discharged home with discharge and medication instructions.

## 2011-04-13 NOTE — H&P (Signed)
Admit date: 04/12/2011 Referring Physician Dr. Juleen China Primary Cardiologist  Dr. Patty Sermons Chief complaint/reason for admission:  Abdominal pain and chest pain with cough  HPI: This is a 60yo AA male with no prior cardiac history who developed cold symptoms with myalgias, fever and chills and then developed a cough.  He was treating himself with over the counter cold medications.  About 1 week into the cold he started having chest pain when he would cough on the right side of his chest.  He presented to the ER on 12/5 because of the cough and was given codeine cough syrup which made him nauseated.  He went home but then returned to the ER with complaints of dizziness and nausea and went back to the ER on 12/10.  He was given an inhaler and sent home.  Yesterday am he came back to the ER with tingling and numbness in his feet and also felt nauseated along with abdominal pain only with coughing.  He denied any SOB, palpitations, vomiting or diarrhea.  He thinks the codeine made him nauseated.     PMH:   History reviewed. No pertinent past medical history.  PSH:    Past Surgical History  Procedure Date  . Appendectomy   .      ALLERGIES:   Codeine  Prior to Admit Meds:   Prescriptions prior to admission  Medication Sig Dispense Refill  . albuterol (VENTOLIN HFA) 108 (90 BASE) MCG/ACT inhaler Inhale 2 puffs into the lungs every 4 (four) hours as needed. For shortness of breath       . multivitamin (ONE-A-DAY MEN'S) TABS Take 1 tablet by mouth daily.         Family HX:   Father deceased of unknown cause.  Mother died of emphysema.  No family history of CAD Social HX:    History   Social History  . Marital Status: Married    Spouse Name: N/A    Number of Children: N/A  . Years of Education: N/A   Occupational History  . Not on file.   Social History Main Topics  . Smoking status: Current Everyday Smoker -- 0.5 packs/day    Types: Cigarettes  . Smokeless tobacco: Not on file  . Alcohol  Use: No  . Drug Use: No  . Sexually Active:    Other Topics Concern  . Not on file   Social History Narrative  . No narrative on file     ROS:  All 11 ROS were addressed and are negative except what is stated in the HPI  PHYSICAL EXAM Filed Vitals:   04/13/11 0400  BP: 169/94  Pulse: 63  Temp:   Resp: 14   General: Well developed, well nourished, in no acute distress Head: Eyes PERRLA, No xanthomas.   Normal cephalic and atramatic  Lungs:   Clear bilaterally to auscultation and percussion. Heart:   HRRR S1 S2 Pulses are 2+ & equal.            No carotid bruit. No JVD.  No abdominal bruits. No femoral bruits. Abdomen: Bowel sounds are positive, abdomen firm but soft with no palpable masses Extremities:   No clubbing, cyanosis or edema.  DP +1 Neuro: Alert and oriented X 3. Psych:  Good affect, responds appropriately   Labs:   Lab Results  Component Value Date   WBC 5.7 04/12/2011   HGB 13.4 04/12/2011   HCT 41.0 04/12/2011   MCV 86.0 04/12/2011   PLT 197 04/12/2011  Lab 04/12/11 1553  NA 138  K 3.9  CL 101  CO2 27  BUN 14  CREATININE 0.79  CALCIUM 9.8  PROT 7.4  BILITOT 0.6  ALKPHOS 104  ALT 11  AST 15  GLUCOSE 102*   Lab Results  Component Value Date   TROPONINI <0.30 04/12/2011     Radiology:  *RADIOLOGY REPORT*  Clinical Data: Cough, smoker.  CHEST - 2 VIEW  Comparison: [place] study 03/27/2010  Findings: There is hyperinflation of the lungs compatible with  COPD. Heart and mediastinal contours are within normal limits. No  focal opacities or effusions. No acute bony abnormality.  IMPRESSION:  COPD. No active disease.  Original Report Authenticated By: Cyndie Chime, M.D.   EKG:  NST with nonspecific T wave inversions in inferior and lateral precordial leads  ASSESSMENT:  1.  Recent URI with residual cough 2.  Right sided musculoskeletal pain from coughing 3.  Abdominal pain with coughing 4.  Nausea due to codeine 5.  Nonspecific  diffuse T wave changes  PLAN:   1.  Admit to tele bed 2.  Cycle cardiac enzymes 3.  2D echo in am to assess LVF given T wave changes 4.  Symptoms of abdominal pain and right sided CP most c/w musculoskeletal etiology from coughing.  With normal enzymes will not anticoagulate at this time unless further enzymes become abnormal or he develops symptoms of USAP 5.  NPO with further w/u per  cards  Quintella Reichert, MD  04/13/2011  4:11 AM     Admit date: 04/12/2011

## 2011-04-13 NOTE — Progress Notes (Signed)
Addendum: Pt admitted overnight with CP. Initial ez neg, D-dimer neg. Spoke with pt, he is pain-free. Sx were atypical so will resume diet, cont to cycle ez, tx telemetry. Consider OP MV if ez remain neg.  Exam: Slender AAM, NAD. Lungs: few dry rales, otherwise clear. CV: RRR, no sig m/r/g Abd: soft, NT, +BS Extrem: no edema  Labs: Trop I neg x2, d-dimer neg. Other labs OK except glucose 122 and K+3.4 - will supp, Olga Millers See progress notes.

## 2011-04-13 NOTE — Progress Notes (Signed)
@   Subjective:  Denies CP or dyspnea   Objective:  Filed Vitals:   04/13/11 0400 04/13/11 0500 04/13/11 0600 04/13/11 0700  BP: 169/94 162/94 149/90 146/93  Pulse: 63 64 66 65  Temp: 98.4 F (36.9 C)     TempSrc: Oral     Resp: 14 15 16 13   Height: 5\' 6"  (1.676 m)     Weight: 107 lb (48.535 kg)     SpO2: 98% 96% 97% 96%    Intake/Output from previous day:  Intake/Output Summary (Last 24 hours) at 04/13/11 0725 Last data filed at 04/13/11 0600  Gross per 24 hour  Intake      0 ml  Output     50 ml  Net    -50 ml    Physical Exam: Physical exam: Well-developed well-nourished in no acute distress.  Skin is warm and dry.  HEENT is normal.  Neck is supple. No thyromegaly.  Chest is clear to auscultation with normal expansion.  Cardiovascular exam is regular rate and rhythm.  Abdominal exam nontender or distended. No masses palpated. Extremities show no edema. neuro grossly intact    Lab Results: Basic Metabolic Panel:  Basename 04/13/11 0540 04/12/11 1553  NA 138 138  K 3.4* 3.9  CL 101 101  CO2 25 27  GLUCOSE 95 102*  BUN 11 14  CREATININE 0.64 0.79  CALCIUM 9.3 9.8  MG -- --  PHOS -- --   Liver Function Tests:  Basename 04/13/11 0540 04/12/11 1553  AST 16 15  ALT 10 11  ALKPHOS 97 104  BILITOT 0.8 0.6  PROT 7.3 7.4  ALBUMIN 3.6 3.9    Basename 04/12/11 1553  LIPASE 6*  AMYLASE --   CBC:  Basename 04/13/11 0540 04/12/11 1553 04/11/11 2204  WBC 6.1 5.7 --  NEUTROABS PENDING -- 2.9  HGB 13.4 13.4 --  HCT 39.3 41.0 --  MCV 84.0 86.0 --  PLT 197 197 --   Cardiac Enzymes:  Basename 04/12/11 2053 04/12/11 1553  CKTOTAL -- --  CKMB -- --  CKMBINDEX -- --  TROPONINI <0.30 <0.30   D-Dimer:  Basename 04/12/11 1553  DDIMER 0.44      Assessment/Plan:  1) Chest pain - recent URI and only with cough. Most likely muskuloskeletal. If FU enzymes neg and echo with normal LV function, DC today and schedule outpatient stress echo. ECG with  anterior Twave changes. 2) tobacco abuse - counseled on dcing 3) Hypertension- treat with toprol 25 mg daily after dc and fu primary care. Olga Millers 04/13/2011, 7:25 AM

## 2011-04-13 NOTE — Discharge Summary (Signed)
See progress notes Jeffery [name]  

## 2011-04-13 NOTE — Progress Notes (Signed)
  Echocardiogram 2D Echocardiogram has been performed.  Cassidie Veiga Nira Retort 04/13/2011, 11:28 AM

## 2011-04-13 NOTE — ED Notes (Signed)
Pt transferred to [facility] via [facility] at this time.

## 2011-04-19 ENCOUNTER — Ambulatory Visit (HOSPITAL_COMMUNITY)
Admission: RE | Admit: 2011-04-19 | Discharge: 2011-04-19 | Disposition: A | Discharge status: Home / Self Care | Payer: Self-pay | Source: Ambulatory Visit | Attending: Internal Medicine | Admitting: Internal Medicine

## 2011-04-19 ENCOUNTER — Encounter (HOSPITAL_COMMUNITY): Payer: Self-pay | Admitting: Cardiology

## 2011-04-19 DIAGNOSIS — F17200 Nicotine dependence, unspecified, uncomplicated: Secondary | ICD-10-CM | POA: Insufficient documentation

## 2011-04-19 DIAGNOSIS — R079 Chest pain, unspecified: Secondary | ICD-10-CM | POA: Insufficient documentation

## 2011-04-19 NOTE — Progress Notes (Signed)
*  PRELIMINARY RESULTS* Echocardiogram Echocardiogram Stress Test has been performed.  Conrad Long [place] 04/19/2011, 10:33 AM

## 2011-04-19 NOTE — Progress Notes (Signed)
Stress Lab Nurses Notes - Adrien Shankar 04/19/2011  Reason for doing test: Chest Pain Type of test: Stress Echo Nurse performing test: Parke Poisson, RN Nuclear Medicine Tech: Not Applicable Echo Tech: Karrie Doffing MD performing test: R. Dietrich Pates Family MD: NPCP Test explained and consent signed: yes IV started: No IV started Symptoms: SOB & fatigue Treatment/Intervention: None Reason test stopped: fatigue After recovery IV was: NA Patient to return to Nuc. Med at :NA Patient discharged: Home Patient's Condition upon discharge was: stable Comments: During test peak BP 152/60 & HR 137. Recovery BP 140/70 & HR 82.  Symptoms resolved in recovery. Erskine Speed T

## 2011-05-16 ENCOUNTER — Encounter: Payer: Self-pay | Admitting: Cardiology

## 2011-05-16 ENCOUNTER — Ambulatory Visit (INDEPENDENT_AMBULATORY_CARE_PROVIDER_SITE_OTHER): Payer: Self-pay | Admitting: Cardiology

## 2011-05-16 ENCOUNTER — Encounter: Payer: Self-pay | Admitting: *Deleted

## 2011-05-16 VITALS — BP 137/91 | HR 86 | Ht 66.0 in | Wt 120.1 lb

## 2011-05-16 DIAGNOSIS — I2510 Atherosclerotic heart disease of native coronary artery without angina pectoris: Secondary | ICD-10-CM

## 2011-05-16 DIAGNOSIS — I10 Essential (primary) hypertension: Secondary | ICD-10-CM

## 2011-05-16 DIAGNOSIS — R079 Chest pain, unspecified: Secondary | ICD-10-CM

## 2011-05-16 DIAGNOSIS — Z720 Tobacco use: Secondary | ICD-10-CM | POA: Insufficient documentation

## 2011-05-16 DIAGNOSIS — R7301 Impaired fasting glucose: Secondary | ICD-10-CM | POA: Insufficient documentation

## 2011-05-16 DIAGNOSIS — F17200 Nicotine dependence, unspecified, uncomplicated: Secondary | ICD-10-CM

## 2011-05-16 LAB — LIPID PANEL
Cholesterol: 174 mg/dL (ref 0–200)
Total CHOL/HDL Ratio: 4 Ratio
Triglycerides: 155 mg/dL — ABNORMAL HIGH (ref <150)
VLDL: 31 mg/dL (ref 0–40)

## 2011-05-16 LAB — CBC
HCT: 40.9 % (ref 39.0–52.0)
Hemoglobin: 13.8 g/dL (ref 13.0–17.0)
MCH: 29.7 pg (ref 26.0–34.0)
MCHC: 33.7 g/dL (ref 30.0–36.0)
MCV: 88.1 fL (ref 78.0–100.0)

## 2011-05-16 LAB — COMPREHENSIVE METABOLIC PANEL
Alkaline Phosphatase: 109 U/L (ref 39–117)
BUN: 11 mg/dL (ref 6–23)
Glucose, Bld: 93 mg/dL (ref 70–99)
Total Bilirubin: 0.4 mg/dL (ref 0.3–1.2)

## 2011-05-16 MED ORDER — METOPROLOL SUCCINATE ER 50 MG PO TB24: 50.0000 mg | ORAL_TABLET | Freq: Every day | ORAL | Status: DC

## 2011-05-16 NOTE — Patient Instructions (Addendum)
Your physician recommends that you schedule a follow-up appointment in: 3 weeks  Your physician has requested that you have a cardiac catheterization. Cardiac catheterization is used to diagnose and/or treat various heart conditions. Doctors may recommend this procedure for a number of different reasons. The most common reason is to evaluate chest pain. Chest pain can be a symptom of coronary artery disease (CAD), and cardiac catheterization can show whether plaque is narrowing or blocking your heart's arteries. This procedure is also used to evaluate the valves, as well as measure the blood flow and oxygen levels in different parts of your heart. For further information please visit [email or web address] Please follow instruction sheet, as given.  Your physician recommends that you return for lab work in: NOW  Your physician has recommended you make the following change in your medication: INCREASE Metoprolol to 50 mg daily START Aspirin 81 mg daily

## 2011-05-16 NOTE — Assessment & Plan Note (Signed)
Symptoms were atypical at the time of hospital admission, but EKG abnormalities were impressive.  Stress echocardiogram suggests the presence of coronary disease with either infarction or severe ischemia in the inferolateral myocardium.  The pros and cons as well as the risks and benefits of cardiac catheterization were discussed.  Patient and wife agreed to proceed.  An outpatient test will be arranged.  Lipid profile will be obtained with a return office visit shortly after the cardiac catheterization has been performed.

## 2011-05-16 NOTE — Assessment & Plan Note (Signed)
Blood pressure control is suboptimal.  Metoprolol dosage will initially be increased to 50 mg per day, but a 2nd agent may well be required.  This will be addressed at the patient's next office visit.

## 2011-05-16 NOTE — Assessment & Plan Note (Signed)
Importance of discontinuing tobacco use discussed with patient and his wife.  His only previous period of abstinence occurred when he suffered a fractured mandible and required wiring of his teeth.  We will 1st attempt to stop without pharmacologic assistance.  The options for nicotine replacement and other drugs were discussed with him.

## 2011-05-16 NOTE — Progress Notes (Signed)
Patient ID: Jeffery [name], male   DOB: Jul 22, 1950, 61 y.o.   MRN: [id number] HPI: Initial office visit following initial evaluation in the [facility] emergency department and subsequently transferred to [facility] for assessment of chest discomfort.  The patient initially presented with a upper respiratory infection for which she was prescribed a codeine-containing cough preparation.  He subsequently developed abdominal/chest discomfort, thought possibly to be an adverse reaction to codeine; however, EKG was markedly abnormal prompting hospital to hospital transfer.  Patient subsequently did well, had negative cardiac markers and was scheduled for an outpatient stress echocardiogram, which revealed a persistent wall motion abnormality inferolaterally with preserved overall left ventricular systolic function.  Current Outpatient Prescriptions on File Prior to Visit  Medication Sig Dispense Refill  . albuterol (VENTOLIN HFA) 108 (90 BASE) MCG/ACT inhaler Inhale 2 puffs into the lungs every 4 (four) hours as needed. For shortness of breath       . metoprolol succinate (TOPROL XL) 25 MG 24 hr tablet Take 1 tablet (25 mg total) by mouth daily.  30 tablet  11  . multivitamin (ONE-A-DAY MEN'S) TABS Take 1 tablet by mouth daily.          Allergies  Allergen Reactions  . Codeine Nausea Only      Past Medical History  Diagnosis Date  . Fasting hyperglycemia     Normal hemoglobin A1c of 6.0  . Hypertension   . Tobacco abuse   . Chest pain 04/2011    Normal echocardiogram     Past Surgical History  Procedure Date  . Appendectomy   . Appendectomy      Family History  Problem Relation Age of Onset  . Emphysema Father      History   Social History  . Marital Status: Married    Spouse Name: N/A    Number of Children: N/A  . Years of Education: N/A   Occupational History  . Not on file.   Social History Main Topics  . Smoking status: Current Everyday Smoker -- 1.0  packs/day for 35 years    Types: Cigarettes  . Smokeless tobacco: Never Used  . Alcohol Use: No  . Drug Use: No  . Sexually Active: Not on file   Other Topics Concern  . Not on file   Social History Narrative  . No narrative on file     ROS:      All other systems reviewed and are negative.  PHYSICAL EXAM: BP 137/91  Pulse 86  Ht 5\' 6"  (1.676 m)  Wt 54.468 kg (120 lb 1.3 oz)  BMI 19.38 kg/m2   General-Well-developed; no acute distress Body Habitus-proportionate weight and height HEENT-[place]/AT; PERRL; EOM intact; conjunctiva and lids nl Neck-No JVD; no carotid bruits Endocrine-No thyromegaly Lungs-Clear lung fields; resonant percussion; normal I-to-E ratio Cardiovascular- normal PMI; normal S1 and S2 Abdomen-BS normal; soft and non-tender without masses or organomegaly Musculoskeletal-No deformities, cyanosis or clubbing Neurologic-Nl cranial nerves; symmetric strength and tone Skin- Warm, no significant lesions Extremities-Nl distal pulses; no edema  EKG:  Tracing performed 04/11/11 reviewed-normal sinus rhythm, left atrial abnormality, nondiagnostic inferior Q waves, T wave abnormality consistent with anterolateral ischemia.  No previous tracing for comparison.  ASSESSMENT AND PLAN:  Furman Bing, MD 05/16/2011 1:54 PM

## 2011-05-17 ENCOUNTER — Encounter: Payer: Self-pay | Admitting: Cardiology

## 2011-05-17 ENCOUNTER — Encounter (HOSPITAL_COMMUNITY)
Admission: RE | Disposition: A | Discharge status: Home / Self Care | Payer: Self-pay | Source: Ambulatory Visit | Attending: Cardiovascular Disease

## 2011-05-17 ENCOUNTER — Ambulatory Visit (HOSPITAL_COMMUNITY)
Admission: RE | Admit: 2011-05-17 | Discharge: 2011-05-17 | Disposition: A | Discharge status: Home / Self Care | Payer: Self-pay | Source: Ambulatory Visit | Attending: Cardiovascular Disease | Admitting: Cardiovascular Disease

## 2011-05-17 DIAGNOSIS — I10 Essential (primary) hypertension: Secondary | ICD-10-CM

## 2011-05-17 DIAGNOSIS — I2510 Atherosclerotic heart disease of native coronary artery without angina pectoris: Secondary | ICD-10-CM | POA: Insufficient documentation

## 2011-05-17 DIAGNOSIS — Z720 Tobacco use: Secondary | ICD-10-CM

## 2011-05-17 DIAGNOSIS — R079 Chest pain, unspecified: Secondary | ICD-10-CM

## 2011-05-17 HISTORY — PX: LEFT HEART CATHETERIZATION WITH CORONARY ANGIOGRAM: SHX5451

## 2011-05-17 LAB — PROTIME-INR: INR: 1.11 (ref 0.00–1.49)

## 2011-05-17 SURGERY — LEFT HEART CATHETERIZATION WITH CORONARY ANGIOGRAM
Anesthesia: LOCAL

## 2011-05-17 MED ORDER — SODIUM CHLORIDE 0.45 % IV SOLN
INTRAVENOUS | Status: DC
  Administered 2011-05-17: 13:00:00 via INTRAVENOUS

## 2011-05-17 MED ORDER — LIDOCAINE HCL (PF) 1 % IJ SOLN
INTRAMUSCULAR | Status: AC
  Filled 2011-05-17: qty 30

## 2011-05-17 MED ORDER — HEPARIN (PORCINE) IN NACL 2-0.9 UNIT/ML-% IJ SOLN
INTRAMUSCULAR | Status: AC
  Filled 2011-05-17: qty 2000

## 2011-05-17 MED ORDER — FENTANYL CITRATE 0.05 MG/ML IJ SOLN
INTRAMUSCULAR | Status: AC
  Filled 2011-05-17: qty 2

## 2011-05-17 MED ORDER — ACETAMINOPHEN 325 MG PO TABS: 650.0000 mg | ORAL_TABLET | ORAL | Status: DC | PRN

## 2011-05-17 MED ORDER — ASPIRIN 81 MG PO CHEW
324.0000 mg | CHEWABLE_TABLET | ORAL | Status: AC
  Administered 2011-05-17: 324 mg via ORAL

## 2011-05-17 MED ORDER — ASPIRIN 81 MG PO CHEW
CHEWABLE_TABLET | ORAL | Status: AC
  Filled 2011-05-17: qty 4

## 2011-05-17 MED ORDER — HEPARIN SODIUM (PORCINE) 1000 UNIT/ML IJ SOLN
INTRAMUSCULAR | Status: AC
  Filled 2011-05-17: qty 1

## 2011-05-17 MED ORDER — VERAPAMIL HCL 2.5 MG/ML IV SOLN
INTRAVENOUS | Status: AC
  Filled 2011-05-17: qty 2

## 2011-05-17 MED ORDER — ONDANSETRON HCL 4 MG/2ML IJ SOLN: 4.0000 mg | Freq: Four times a day (QID) | INTRAMUSCULAR | Status: DC | PRN

## 2011-05-17 MED ORDER — NITROGLYCERIN 0.2 MG/ML ON CALL CATH LAB
INTRAVENOUS | Status: AC
  Filled 2011-05-17: qty 1

## 2011-05-17 MED ORDER — MIDAZOLAM HCL 2 MG/2ML IJ SOLN
INTRAMUSCULAR | Status: AC
  Filled 2011-05-17: qty 2

## 2011-05-17 NOTE — H&P (Signed)
The patient is a 61 year old gentleman presenting for cardiac catheterization. I have independently reviewed the risks, indication, and alternatives to cardiac catheterization and possible PCI. I have reviewed Dr. Marvel Plan full H&P dated 05/16/2011. I agree with note further additions to make. The patient has a markedly abnormal EKG and inferolateral wall motion abnormality on echocardiogram persistent with stress echo. He is referred for cardiac cath to further evaluate.

## 2011-05-17 NOTE — Op Note (Signed)
Cardiac Catheterization Procedure Note  Name: Jeffery [name] MRN: [id number] DOB: 11/06/1950  Procedure: Left Heart Cath, Selective Coronary Angiography, LV angiography  Indication: Chest pain, abnormal EKG, abnormal stress echocardiogram   Procedural Details: The right wrist was prepped, draped, and anesthetized with 1% lidocaine. Using the modified Seldinger technique, a 5 French sheath was introduced into the right radial artery. 3 mg of verapamil was administered through the sheath, weight-based unfractionated heparin was administered intravenously. Standard Judkins catheters were used for selective coronary angiography and left ventriculography. Catheter exchanges were performed over an exchange length guidewire. There were no immediate procedural complications. A TR band was used for radial hemostasis at the completion of the procedure.  The patient was transferred to the post catheterization recovery area for further monitoring.  Procedural Findings: Hemodynamics: AO 141/82 LV 145/9  Coronary angiography: Coronary dominance: right  Left mainstem: Widely patent with no obstructive disease.  Left anterior descending (LAD): Widely patent to the left ventricular apex. There are minor irregularities but no significant stenoses throughout. The LAD wraps the LV apex.  Left circumflex (LCx): There is a moderate caliber intermediate branch without significant stenosis. The AV groove circumflex has minor irregularity in the mid vessel with no more than 30% stenosis present. The vessel gives off an obtuse marginal and a posterolateral branch. There is no high-grade stenosis throughout.  Right coronary artery (RCA): The RCA is widely patent throughout. The vessel is tortuous. There are no significant stenoses present. The vessel supplies a PDA branch and is thus a dominant RCA.  Left ventriculography: Left ventricular systolic function is normal, LVEF is estimated at 55-65%, there is no  significant mitral regurgitation   Final Conclusions:   1. Minor nonobstructive CAD as outlined above 2. Normal LV systolic function  Recommendations: Tobacco cessation, primary prevention measures. The patient does not appear to have significant coronary obstructive disease.  Maurizio Geno 05/17/2011, 4:13 PM

## 2011-05-18 ENCOUNTER — Telehealth: Payer: Self-pay | Admitting: Cardiovascular Disease

## 2011-05-18 NOTE — Telephone Encounter (Signed)
I spoke with the pt and his wife and made them aware that note was written and faxed.  Copy of letter mailed to the pt's home. The pt can return to work on 05/24/11 with no restrictions.

## 2011-05-18 NOTE — Telephone Encounter (Signed)
New msg Pt's wife called She said that he had cardiac cath yesterday and he didn't get a note to go to his job. He needs one faxed to [facility] at [phone] saying he will be out till next tues and showing his restrictions. Please call her back when done

## 2011-06-06 ENCOUNTER — Encounter: Payer: Self-pay | Admitting: Cardiology

## 2011-06-06 ENCOUNTER — Encounter: Payer: Self-pay | Admitting: *Deleted

## 2011-06-06 ENCOUNTER — Ambulatory Visit (INDEPENDENT_AMBULATORY_CARE_PROVIDER_SITE_OTHER): Payer: Self-pay | Admitting: Cardiology

## 2011-06-06 VITALS — BP 166/98 | HR 66 | Resp 18 | Ht 66.0 in | Wt 116.0 lb

## 2011-06-06 DIAGNOSIS — I10 Essential (primary) hypertension: Secondary | ICD-10-CM

## 2011-06-06 DIAGNOSIS — E785 Hyperlipidemia, unspecified: Secondary | ICD-10-CM

## 2011-06-06 DIAGNOSIS — F17200 Nicotine dependence, unspecified, uncomplicated: Secondary | ICD-10-CM

## 2011-06-06 DIAGNOSIS — Z720 Tobacco use: Secondary | ICD-10-CM

## 2011-06-06 DIAGNOSIS — R079 Chest pain, unspecified: Secondary | ICD-10-CM

## 2011-06-06 MED ORDER — CHLORTHALIDONE 25 MG PO TABS: ORAL_TABLET | ORAL | Status: DC

## 2011-06-06 NOTE — Patient Instructions (Signed)
Your physician recommends that you schedule a follow-up appointment in: As needed  Your physician has requested that you regularly monitor and record your blood pressure readings at home. Please use the same machine at the same time of day to check your readings and record them to take to your Primary Care Physician.  Your physician recommends that you return for lab work in: 1 Month - You will receive a letter  Stop Smoking (see instructions)

## 2011-06-06 NOTE — Progress Notes (Signed)
Patient ID: Jeffery [name], male   DOB: 1950/06/27, 61 y.o.   MRN: [id number] HPI: Return visit following cardiac catheterization.  Procedure was performed from the right radial artery and demonstrated normal left ventricular systolic function and insignificant coronary disease, the worst lesion being a 30% stenosis of the circumflex.  Patient has suffered no recurrence of chest discomfort.  Prior to Admission medications   Medication Sig Start Date End Date Taking? Authorizing Provider  albuterol (VENTOLIN HFA) 108 (90 BASE) MCG/ACT inhaler Inhale 2 puffs into the lungs every 4 (four) hours as needed. For shortness of breath    Yes Historical Provider, MD  aspirin 81 MG tablet Take 160 mg by mouth daily.   Yes Historical Provider, MD  metoprolol succinate (TOPROL-XL) 50 MG 24 hr tablet Take 1 tablet (50 mg total) by mouth daily. Take with or immediately following a meal. 05/16/11 05/15/12 Yes Gerrit Friends. Jemimah Cressy, MD  multivitamin (ONE-A-DAY MEN'S) TABS Take 1 tablet by mouth daily.     Yes Historical Provider, MD  chlorthalidone (HYGROTON) 25 MG tablet 1/2 tablet daily = 12.5 mg 06/06/11   Gerrit Friends. Dietrich Pates, MD    Allergies  Allergen Reactions  . Codeine Nausea Only  Past medical history, social history, and family history reviewed and updated.  ROS: See history of present illness  PHYSICAL EXAM: BP 166/98  Pulse 66  Resp 18  Ht 5\' 6"  (1.676 m)  Wt 52.617 kg (116 lb)  BMI 18.72 kg/m2 ; weight decreased 4 pounds over the past 2 weeks. General-Well developed; no acute distress; chief and suboptimal repair Body habitus-thin Neck-No JVD; no carotid bruits Lungs-clear lung fields; resonant to percussion Cardiovascular-normal PMI; normal S1 and S2; grade 2/6 systolic ejection murmur at the upper left sternal border Abdomen-normal bowel sounds; soft and non-tender without masses or organomegaly Musculoskeletal-No deformities, no cyanosis or clubbing Neurologic-Normal cranial nerves; symmetric  strength and tone Skin-Warm, no significant lesions Extremities-distal pulses intact; no edema; normal right radial pulse with healed puncture at the cath site and slight bruising along the length of the artery towards the antecubital fossa.  ASSESSMENT AND PLAN:  [place] Bing, MD 06/06/2011 4:30 PM

## 2011-06-06 NOTE — Assessment & Plan Note (Addendum)
With a near-normal cardiac catheterization, chest discomfort unlikely to reflect cardiac disease.  Unless symptoms recur, no further testing or treatment is warranted.  Due to the presence of minor coronary disease, aspirin 81 mg per day will be continued and lipids assessed with a low threshold for adding a statin to his medical regime.

## 2011-06-06 NOTE — Assessment & Plan Note (Addendum)
Patient provided teaching regarding the approach to discontinuing tobacco use.  A quit attempt without pharmacologic assistance was recommended.  If this fails, he will seek further treatment from his PCP.

## 2011-06-06 NOTE — Assessment & Plan Note (Signed)
Blood pressure control is inadequate.  Metoprolol will be increased to 50 mg twice a day and chlorthalidone 12.5 mg per day added to his medical regime.  He will monitor blood pressures at home and return that list to Dr. Olena Leatherwood, with whom he has an appointment to establish as a new patient.

## 2011-06-22 ENCOUNTER — Other Ambulatory Visit: Payer: Self-pay | Admitting: *Deleted

## 2011-06-22 DIAGNOSIS — E782 Mixed hyperlipidemia: Secondary | ICD-10-CM

## 2011-06-22 DIAGNOSIS — I10 Essential (primary) hypertension: Secondary | ICD-10-CM

## 2011-06-30 ENCOUNTER — Telehealth: Payer: Self-pay | Admitting: *Deleted

## 2011-06-30 DIAGNOSIS — I10 Essential (primary) hypertension: Secondary | ICD-10-CM

## 2011-06-30 DIAGNOSIS — E782 Mixed hyperlipidemia: Secondary | ICD-10-CM

## 2011-07-08 ENCOUNTER — Encounter: Payer: Self-pay | Admitting: *Deleted

## 2011-07-18 ENCOUNTER — Other Ambulatory Visit: Payer: Self-pay | Admitting: Cardiology

## 2011-07-19 LAB — COMPREHENSIVE METABOLIC PANEL
ALT: 18 U/L (ref 0–53)
AST: 19 U/L (ref 0–37)
Albumin: 4.3 g/dL (ref 3.5–5.2)
BUN: 13 mg/dL (ref 6–23)
CO2: 31 mEq/L (ref 19–32)
Calcium: 9.9 mg/dL (ref 8.4–10.5)
Chloride: 99 mEq/L (ref 96–112)
Creat: 0.89 mg/dL (ref 0.50–1.35)
Potassium: 3.6 mEq/L (ref 3.5–5.3)

## 2011-07-19 LAB — LIPID PANEL
Cholesterol: 177 mg/dL (ref 0–200)
HDL: 38 mg/dL — ABNORMAL LOW (ref >39)

## 2011-07-20 ENCOUNTER — Encounter: Payer: Self-pay | Admitting: *Deleted

## 2011-07-20 ENCOUNTER — Encounter: Payer: Self-pay | Admitting: Cardiology

## 2011-07-20 DIAGNOSIS — E785 Hyperlipidemia, unspecified: Secondary | ICD-10-CM | POA: Insufficient documentation

## 2012-01-25 ENCOUNTER — Other Ambulatory Visit: Payer: Self-pay | Admitting: Cardiology

## 2012-02-25 ENCOUNTER — Other Ambulatory Visit: Payer: Self-pay | Admitting: Cardiology

## 2012-05-13 ENCOUNTER — Emergency Department (HOSPITAL_COMMUNITY)
Admission: EM | Admit: 2012-05-13 | Discharge: 2012-05-13 | Disposition: A | Discharge status: Home / Self Care | Payer: BC Managed Care – PPO | Attending: Emergency Medicine | Admitting: Emergency Medicine

## 2012-05-13 ENCOUNTER — Encounter (HOSPITAL_COMMUNITY): Payer: Self-pay | Admitting: *Deleted

## 2012-05-13 DIAGNOSIS — Z79899 Other long term (current) drug therapy: Secondary | ICD-10-CM | POA: Insufficient documentation

## 2012-05-13 DIAGNOSIS — Z8679 Personal history of other diseases of the circulatory system: Secondary | ICD-10-CM | POA: Insufficient documentation

## 2012-05-13 DIAGNOSIS — R21 Rash and other nonspecific skin eruption: Secondary | ICD-10-CM | POA: Insufficient documentation

## 2012-05-13 DIAGNOSIS — Z7982 Long term (current) use of aspirin: Secondary | ICD-10-CM | POA: Insufficient documentation

## 2012-05-13 DIAGNOSIS — T7840XA Allergy, unspecified, initial encounter: Secondary | ICD-10-CM

## 2012-05-13 DIAGNOSIS — R7309 Other abnormal glucose: Secondary | ICD-10-CM | POA: Insufficient documentation

## 2012-05-13 DIAGNOSIS — F17200 Nicotine dependence, unspecified, uncomplicated: Secondary | ICD-10-CM | POA: Insufficient documentation

## 2012-05-13 DIAGNOSIS — I10 Essential (primary) hypertension: Secondary | ICD-10-CM | POA: Insufficient documentation

## 2012-05-13 MED ORDER — PREDNISONE 10 MG PO TABS: ORAL_TABLET | ORAL | Status: DC

## 2012-05-13 MED ORDER — PREDNISONE 50 MG PO TABS
60.0000 mg | ORAL_TABLET | Freq: Once | ORAL | Status: AC
  Administered 2012-05-13: 60 mg via ORAL
  Filled 2012-05-13: qty 1

## 2012-05-13 MED ORDER — DIPHENHYDRAMINE HCL 25 MG PO CAPS: 25.0000 mg | ORAL_CAPSULE | ORAL | Status: DC | PRN

## 2012-05-13 MED ORDER — DIPHENHYDRAMINE HCL 25 MG PO CAPS
25.0000 mg | ORAL_CAPSULE | Freq: Once | ORAL | Status: AC
  Administered 2012-05-13: 25 mg via ORAL
  Filled 2012-05-13: qty 1

## 2012-05-13 MED ORDER — FAMOTIDINE 20 MG PO TABS
20.0000 mg | ORAL_TABLET | Freq: Once | ORAL | Status: AC
  Administered 2012-05-13: 20 mg via ORAL
  Filled 2012-05-13: qty 1

## 2012-05-13 NOTE — ED Notes (Signed)
Pt c/o rash to groin, back, bilateral arms and legs for a week

## 2012-05-13 NOTE — ED Provider Notes (Signed)
History     CSN: [id number]  Arrival date & time 05/13/12  1446   First MD Initiated Contact with Patient 05/13/12 1622      Chief Complaint  Patient presents with  . Rash    (Consider location/radiation/quality/duration/timing/severity/associated sxs/prior treatment) HPI Comments: Patient complains a red itchy rash for one week. States the rash began on his lower back and spread to both arms and groin area. He denies pain, vesicles, swelling, or difficulty swallowing or breathing. He denies change in medication, new foods or exposure to chemicals.  He has not taken any medication or applied any over-the-counter creams  Patient is a 62 y.o. male presenting with rash. The history is provided by the patient.  Rash  This is a new problem. Episode onset: One week. The problem has not changed since onset.The problem is associated with an unknown factor. There has been no fever. The rash is present on the torso, groin, left arm and right arm. The patient is experiencing no pain. The pain has been constant since onset. Associated symptoms include itching. Pertinent negatives include no blisters, no pain and no weeping. He has tried nothing for the symptoms. The treatment provided no relief.    Past Medical History  Diagnosis Date  . Fasting hyperglycemia     Normal hemoglobin A1c of 6.0  . Hypertension   . Tobacco abuse   . Chest pain 04/2011    Normal echocardiogram    Past Surgical History  Procedure Date  . Appendectomy   . Appendectomy     Family History  Problem Relation Age of Onset  . Emphysema Father     History  Substance Use Topics  . Smoking status: Current Every Day Smoker -- 1.0 packs/day for 35 years    Types: Cigarettes  . Smokeless tobacco: Never Used  . Alcohol Use: No      Review of Systems  Constitutional: Negative for fever, chills, activity change and appetite change.  HENT: Negative for sore throat, facial swelling, trouble swallowing, neck pain  and neck stiffness.   Respiratory: Negative for chest tightness, shortness of breath and wheezing.   Gastrointestinal: Negative for nausea and vomiting.  Musculoskeletal: Negative for myalgias.  Skin: Positive for itching and rash. Negative for wound.  Neurological: Negative for dizziness, weakness, numbness and headaches.  All other systems reviewed and are negative.    Allergies  Codeine  Home Medications   Current Outpatient Rx  Name  Route  Sig  Dispense  Refill  . ASPIRIN 81 MG PO TABS   Oral   Take 81 mg by mouth daily.          . CHLORTHALIDONE 25 MG PO TABS      1/2 tablet daily = 12.5 mg   30 tablet   6   . METOPROLOL SUCCINATE ER 50 MG PO TB24   Oral   Take 50 mg by mouth daily. Take with or immediately following a meal.         . ONE-A-DAY MENS PO TABS   Oral   Take 1 tablet by mouth daily.             BP 131/86  Pulse 78  Temp 97.8 F (36.6 C)  Resp 16  SpO2 100%  Physical Exam  Nursing note and vitals reviewed. Constitutional: He is oriented to person, place, and time. He appears well-developed and well-nourished. No distress.  HENT:  Head: Normocephalic and atraumatic.  Mouth/Throat: Oropharynx is clear and moist.  Neck: Normal range of motion. Neck supple.  Cardiovascular: Normal rate, regular rhythm, normal heart sounds and intact distal pulses.   No murmur heard. Pulmonary/Chest: Effort normal and breath sounds normal. No respiratory distress. He exhibits no tenderness.  Musculoskeletal: Normal range of motion. He exhibits no edema and no tenderness.  Lymphadenopathy:    He has no cervical adenopathy.  Neurological: He is alert and oriented to person, place, and time. He exhibits normal muscle tone. Coordination normal.  Skin: Rash noted. There is erythema.       Erythematous maculopapular rash to the groin, lower back, and bilateral arms.  No edema, vesicles, petechiae, or or pustules. Few scattered areas of excoriation are also  present.    ED Course  Procedures (including critical care time)  Labs Reviewed - No data to display No results found.     MDM    Scattered maculopapular rash to the trunk, groin and bilateral arms.  No edema, airway patent.  Sx's appear c/w allergic rxn vs contact dermatitis.  No rash to the hands or with spaces of the fingers. Doubt scabies   I will treat with diphenhydramine and prednisone taper. Patient agrees to close followup with his primary care physician.  Lamekia Nolden L. [place], [place] 05/13/12 1809

## 2012-05-18 NOTE — ED Provider Notes (Signed)
Medical screening examination/treatment/procedure(s) were performed by non-physician practitioner and as supervising physician I was immediately available for consultation/collaboration.   Starsky Nanna M Aylah Yeary, MD 05/18/12 2156 

## 2012-11-11 ENCOUNTER — Emergency Department (HOSPITAL_COMMUNITY)
Admission: EM | Admit: 2012-11-11 | Discharge: 2012-11-11 | Disposition: A | Discharge status: Home / Self Care | Payer: BC Managed Care – PPO | Attending: Emergency Medicine | Admitting: Emergency Medicine

## 2012-11-11 ENCOUNTER — Encounter (HOSPITAL_COMMUNITY): Payer: Self-pay

## 2012-11-11 DIAGNOSIS — I10 Essential (primary) hypertension: Secondary | ICD-10-CM | POA: Insufficient documentation

## 2012-11-11 DIAGNOSIS — R232 Flushing: Secondary | ICD-10-CM

## 2012-11-11 DIAGNOSIS — Z7982 Long term (current) use of aspirin: Secondary | ICD-10-CM | POA: Insufficient documentation

## 2012-11-11 DIAGNOSIS — J3489 Other specified disorders of nose and nasal sinuses: Secondary | ICD-10-CM | POA: Insufficient documentation

## 2012-11-11 DIAGNOSIS — Z79899 Other long term (current) drug therapy: Secondary | ICD-10-CM | POA: Insufficient documentation

## 2012-11-11 DIAGNOSIS — J4489 Other specified chronic obstructive pulmonary disease: Secondary | ICD-10-CM | POA: Insufficient documentation

## 2012-11-11 DIAGNOSIS — F17200 Nicotine dependence, unspecified, uncomplicated: Secondary | ICD-10-CM | POA: Insufficient documentation

## 2012-11-11 DIAGNOSIS — R0981 Nasal congestion: Secondary | ICD-10-CM

## 2012-11-11 DIAGNOSIS — J449 Chronic obstructive pulmonary disease, unspecified: Secondary | ICD-10-CM | POA: Insufficient documentation

## 2012-11-11 DIAGNOSIS — E1169 Type 2 diabetes mellitus with other specified complication: Secondary | ICD-10-CM | POA: Insufficient documentation

## 2012-11-11 LAB — GLUCOSE, CAPILLARY: Glucose-Capillary: 100 mg/dL — ABNORMAL HIGH (ref 70–99)

## 2012-11-11 MED ORDER — OXYMETAZOLINE HCL 0.05 % NA SOLN
1.0000 | Freq: Once | NASAL | Status: AC
  Administered 2012-11-11: 1 via NASAL
  Filled 2012-11-11: qty 15

## 2012-11-11 NOTE — ED Notes (Signed)
I woke up several times with my legs and feet were hot. States that he can't hardly breathe because his nose is stopped up.

## 2012-11-11 NOTE — ED Provider Notes (Signed)
History    CSN: [id number] Arrival date & time 11/11/12  0545  First MD Initiated Contact with Patient 11/11/12 [phone]     Chief Complaint  Patient presents with  . Hot Flashes  . Nasal Congestion   (Consider location/radiation/quality/duration/timing/severity/associated sxs/prior Treatment) HPI HPI Comments: Jeffery [name] is a 62 y.o. male with a h/o COPD who continues to smoke, HTN, DM who presents to the Emergency Department complaining of having a hot flash that started in his feet and traveled up his body. He woke his wife to tell her about it. He then noticed his nose was stuffed up and he insisted he be brought to the ER.    PCP Dr. Olena Leatherwood Past Medical History  Diagnosis Date  . Fasting hyperglycemia     Normal hemoglobin A1c of 6.0  . Hypertension   . Tobacco abuse   . Chest pain 04/2011    Normal echocardiogram   Past Surgical History  Procedure Laterality Date  . Appendectomy    . Appendectomy     Family History  Problem Relation Age of Onset  . Emphysema Father    History  Substance Use Topics  . Smoking status: Current Every Day Smoker -- 1.00 packs/day for 35 years    Types: Cigarettes  . Smokeless tobacco: Never Used  . Alcohol Use: No    Review of Systems  Constitutional: Negative for fever.       10 Systems reviewed and are negative for acute change except as noted in the HPI.  HENT: Negative for congestion.        Nasal congestion  Eyes: Negative for discharge and redness.  Respiratory: Negative for cough and shortness of breath.   Cardiovascular: Negative for chest pain.  Gastrointestinal: Negative for vomiting and abdominal pain.  Musculoskeletal: Negative for back pain.  Skin: Negative for rash.  Neurological: Negative for syncope, numbness and headaches.  Psychiatric/Behavioral:       No behavior change.    Allergies  Codeine  Home Medications   Current Outpatient Rx  Name  Route  Sig  Dispense  Refill  . aspirin 81 MG  tablet   Oral   Take 81 mg by mouth daily.          . fexofenadine (ALLEGRA) 30 MG tablet   Oral   Take 30 mg by mouth 2 (two) times daily.         . metoprolol succinate (TOPROL-XL) 50 MG 24 hr tablet   Oral   Take 50 mg by mouth daily. Take with or immediately following a meal.         . multivitamin (ONE-A-DAY MEN'S) TABS   Oral   Take 1 tablet by mouth daily.           . chlorthalidone (HYGROTON) 25 MG tablet      1/2 tablet daily = 12.5 mg   30 tablet   6   . diphenhydrAMINE (BENADRYL) 25 mg capsule   Oral   Take 1 capsule (25 mg total) by mouth every 4 (four) hours as needed for itching.   20 capsule   0   . predniSONE (DELTASONE) 10 MG tablet      Take 6 tablets day one, 5 tablets day two, 4 tablets day three, 3 tablets day four, 2 tablets day five, then 1 tablet day six   21 tablet   0    BP 164/95  Pulse 74  Temp(Src) 97.9 F (36.6 C) (Oral)  Resp 16  Ht 5\' 6"  (1.676 m)  Wt 117 lb (53.071 kg)  BMI 18.89 kg/m2  SpO2 100% Physical Exam  Nursing note and vitals reviewed. Constitutional: He appears well-developed and well-nourished.  Awake, alert, nontoxic appearance.  HENT:  Head: Normocephalic and atraumatic.  Nasal congestion  Eyes: Pupils are equal, round, and reactive to light.  Neck: Neck supple.  Cardiovascular: Normal rate and intact distal pulses.   Pulmonary/Chest: Effort normal and breath sounds normal. He exhibits no tenderness.  Abdominal: Soft. Bowel sounds are normal. There is no tenderness. There is no rebound.  Musculoskeletal: He exhibits no tenderness.  Baseline ROM, no obvious new focal weakness.  Neurological:  Mental status and motor strength appears baseline for patient and situation.  Skin: No rash noted.  Psychiatric: He has a normal mood and affect.    ED Course  Procedures (including critical care time)   MDM  Patient presents having had a hot flash. Now with nasal congestion. Given afrin with relief. CBG  100. Pt stable in ED with no significant deterioration in condition.The patient appears reasonably screened and/or stabilized for discharge and I doubt any other medical condition or other [facility] requiring further screening, evaluation, or treatment in the ED at this time prior to discharge.  MDM Reviewed: nursing note and vitals Interpretation: labs     Nicoletta Dress. Colon Branch, MD 11/11/12 [phone]

## 2012-11-11 NOTE — ED Notes (Signed)
Patient with no complaints at this time. Respirations even and unlabored. Skin warm/dry. Discharge instructions reviewed with patient at this time. Patient given opportunity to voice concerns/ask questions. Patient discharged at this time and left Emergency Department with steady gait.   

## 2012-12-20 ENCOUNTER — Other Ambulatory Visit: Payer: Self-pay | Admitting: Cardiology

## 2012-12-25 ENCOUNTER — Encounter (HOSPITAL_COMMUNITY): Payer: Self-pay | Admitting: Emergency Medicine

## 2012-12-25 ENCOUNTER — Emergency Department (HOSPITAL_COMMUNITY)
Admission: EM | Admit: 2012-12-25 | Discharge: 2012-12-25 | Disposition: A | Discharge status: Home / Self Care | Payer: BC Managed Care – PPO | Attending: Emergency Medicine | Admitting: Emergency Medicine

## 2012-12-25 DIAGNOSIS — R55 Syncope and collapse: Secondary | ICD-10-CM | POA: Insufficient documentation

## 2012-12-25 DIAGNOSIS — Z7982 Long term (current) use of aspirin: Secondary | ICD-10-CM | POA: Insufficient documentation

## 2012-12-25 DIAGNOSIS — F17200 Nicotine dependence, unspecified, uncomplicated: Secondary | ICD-10-CM | POA: Insufficient documentation

## 2012-12-25 DIAGNOSIS — Z79899 Other long term (current) drug therapy: Secondary | ICD-10-CM | POA: Insufficient documentation

## 2012-12-25 DIAGNOSIS — IMO0002 Reserved for concepts with insufficient information to code with codable children: Secondary | ICD-10-CM | POA: Insufficient documentation

## 2012-12-25 DIAGNOSIS — I10 Essential (primary) hypertension: Secondary | ICD-10-CM | POA: Insufficient documentation

## 2012-12-25 LAB — CBC WITH DIFFERENTIAL/PLATELET
Eosinophils Relative: 10 % — ABNORMAL HIGH (ref 0–5)
HCT: 38.6 % — ABNORMAL LOW (ref 39.0–52.0)
Hemoglobin: 13.1 g/dL (ref 13.0–17.0)
Lymphocytes Relative: 31 % (ref 12–46)
Lymphs Abs: 2.8 10*3/uL (ref 0.7–4.0)
MCV: 86.2 fL (ref 78.0–100.0)
Monocytes Absolute: 0.7 10*3/uL (ref 0.1–1.0)
Monocytes Relative: 8 % (ref 3–12)
Neutro Abs: 4.7 10*3/uL (ref 1.7–7.7)
WBC: 9.2 10*3/uL (ref 4.0–10.5)

## 2012-12-25 LAB — COMPREHENSIVE METABOLIC PANEL
AST: 27 U/L (ref 0–37)
BUN: 12 mg/dL (ref 6–23)
CO2: 33 mEq/L — ABNORMAL HIGH (ref 19–32)
Chloride: 94 mEq/L — ABNORMAL LOW (ref 96–112)
Creatinine, Ser: 0.8 mg/dL (ref 0.50–1.35)
GFR calc Af Amer: >90 mL/min (ref >90)
GFR calc non Af Amer: >90 mL/min (ref >90)
Glucose, Bld: 128 mg/dL — ABNORMAL HIGH (ref 70–99)
Total Bilirubin: 0.2 mg/dL — ABNORMAL LOW (ref 0.3–1.2)

## 2012-12-25 LAB — TROPONIN I: Troponin I: <0.3 ng/mL (ref <0.30)

## 2012-12-25 LAB — LIPASE, BLOOD: Lipase: 9 U/L — ABNORMAL LOW (ref 11–59)

## 2012-12-25 NOTE — ED Provider Notes (Signed)
CSN: [id number]     Arrival date & time 12/25/12  0430 History   First MD Initiated Contact with Patient 12/25/12 0441     Chief Complaint  Patient presents with  . Excessive Sweating   (Consider location/radiation/quality/duration/timing/severity/associated sxs/prior Treatment) HPI Comments: Patient is a 62 year old male with past medical history significant for hypertension and anxiety. He presents to the emergency department with complaints of feeling hot. States that he was asleep and woke up feeling warm. He then went to the front porch to try to catch some air when he developed sweating and diaphoresis. He stated that he felt as though "someone poured a bucket of water on me". He denies any chest pain or shortness of breath. He denies any abdominal pain. His symptoms lasted for approximately 30 minutes and has since resolved. His wife called 911 during the episode and paramedics confirm he was quite sweaty when they arrived. He remained stable for transport. He did have a similar episode last month which he was seen here and no definite cause was found. Of note is that he was recently started on Celexa. He tells me his first dose was yesterday.  The history is provided by the patient.    Past Medical History  Diagnosis Date  . Fasting hyperglycemia     Normal hemoglobin A1c of 6.0  . Hypertension   . Tobacco abuse   . Chest pain 04/2011    Normal echocardiogram   Past Surgical History  Procedure Laterality Date  . Appendectomy    . Appendectomy     Family History  Problem Relation Age of Onset  . Emphysema Father    History  Substance Use Topics  . Smoking status: Current Every Day Smoker -- 1.00 packs/day for 35 years    Types: Cigarettes  . Smokeless tobacco: Never Used  . Alcohol Use: No    Review of Systems  All other systems reviewed and are negative.    Allergies  Codeine  Home Medications   Current Outpatient Rx  Name  Route  Sig  Dispense  Refill  .  citalopram (CELEXA) 20 MG tablet   Oral   Take 20 mg by mouth daily.         Marland Kitchen aspirin 81 MG tablet   Oral   Take 81 mg by mouth daily.          . chlorthalidone (HYGROTON) 25 MG tablet      1/2 tablet daily = 12.5 mg   30 tablet   6   . diphenhydrAMINE (BENADRYL) 25 mg capsule   Oral   Take 1 capsule (25 mg total) by mouth every 4 (four) hours as needed for itching.   20 capsule   0   . fexofenadine (ALLEGRA) 30 MG tablet   Oral   Take 30 mg by mouth 2 (two) times daily.         . metoprolol succinate (TOPROL-XL) 50 MG 24 hr tablet      TAKE ONE TABLET DAILY. TAKE WITH OR IMMEDIATELY FOLLOWING A MEAL.   30 tablet   3     .Marland KitchenMarland KitchenPatient needs to contact office to schedule  Ap ...   . multivitamin (ONE-A-DAY MEN'S) TABS   Oral   Take 1 tablet by mouth daily.           . predniSONE (DELTASONE) 10 MG tablet      Take 6 tablets day one, 5 tablets day two, 4 tablets day three, 3 tablets day  four, 2 tablets day five, then 1 tablet day six   21 tablet   0    BP 138/97  Pulse 64  Temp(Src) 97.5 F (36.4 C) (Oral)  Resp 18  Ht 5\' 6"  (1.676 m)  Wt 117 lb (53.071 kg)  BMI 18.89 kg/m2  SpO2 99% Physical Exam  Nursing note and vitals reviewed. Constitutional: He is oriented to person, place, and time. He appears well-developed and well-nourished. No distress.  HENT:  Head: Normocephalic and atraumatic.  Eyes: EOM are normal. Pupils are equal, round, and reactive to light.  Neck: Normal range of motion. Neck supple.  Cardiovascular: Normal rate, regular rhythm and normal heart sounds.   No murmur heard. Pulmonary/Chest: Effort normal and breath sounds normal. No respiratory distress. He has no wheezes.  Abdominal: Soft. Bowel sounds are normal. He exhibits no distension. There is no tenderness.  Musculoskeletal: Normal range of motion. He exhibits no edema.  Neurological: He is alert and oriented to person, place, and time.  Skin: Skin is warm and dry. He is not  diaphoretic.    ED Course  Procedures (including critical care time) Labs Review Labs Reviewed  CBC WITH DIFFERENTIAL  COMPREHENSIVE METABOLIC PANEL  LIPASE, BLOOD  TROPONIN I   Imaging Review No results found.   Date: 12/25/2012  Rate: 62  Rhythm: normal sinus rhythm  QRS Axis: normal  Intervals: normal  ST/T Wave abnormalities: nonspecific T wave changes  Conduction Disutrbances:none  Narrative Interpretation:   Old EKG Reviewed: unchanged    MDM  No diagnosis found. Patient presents here with complaints of a diaphoretic episode that occurred after he woke from sleep. It seems to resolve by the time he got here and workup urine is essentially unremarkable. His physical examination is normal, EKG is identical to previous tracings, and laboratory studies did not reveal any acute abnormalities. He appears well and is experienced no further symptoms. He was recently started on Celexa this seems to coincide with the onset of his symptoms. I will advise him to discuss this medication with his provider before taking any more as this may be the cause of his episode. He appears stable for discharge to home and has been advised to return should he experience worsening symptoms or new or concerning symptoms    Geoffery Lyons, MD 12/25/12 [phone]

## 2012-12-25 NOTE — ED Notes (Signed)
Patient states "I woke up hot tonight and went to the bathroom and while I was using the bathroom I started sweating really bad." Patient states he had diarrhea x 2 times before arrival to ED. Denies chest pain.

## 2012-12-28 ENCOUNTER — Encounter (HOSPITAL_COMMUNITY): Payer: Self-pay | Admitting: *Deleted

## 2012-12-28 ENCOUNTER — Emergency Department (HOSPITAL_COMMUNITY)
Admission: EM | Admit: 2012-12-28 | Discharge: 2012-12-28 | Disposition: A | Discharge status: Home / Self Care | Payer: BC Managed Care – PPO | Attending: Emergency Medicine | Admitting: Emergency Medicine

## 2012-12-28 DIAGNOSIS — Z7982 Long term (current) use of aspirin: Secondary | ICD-10-CM | POA: Insufficient documentation

## 2012-12-28 DIAGNOSIS — R238 Other skin changes: Secondary | ICD-10-CM | POA: Insufficient documentation

## 2012-12-28 DIAGNOSIS — R6883 Chills (without fever): Secondary | ICD-10-CM | POA: Insufficient documentation

## 2012-12-28 DIAGNOSIS — Z79899 Other long term (current) drug therapy: Secondary | ICD-10-CM | POA: Insufficient documentation

## 2012-12-28 DIAGNOSIS — R209 Unspecified disturbances of skin sensation: Secondary | ICD-10-CM | POA: Insufficient documentation

## 2012-12-28 DIAGNOSIS — R0602 Shortness of breath: Secondary | ICD-10-CM | POA: Insufficient documentation

## 2012-12-28 DIAGNOSIS — R002 Palpitations: Secondary | ICD-10-CM | POA: Insufficient documentation

## 2012-12-28 DIAGNOSIS — I10 Essential (primary) hypertension: Secondary | ICD-10-CM | POA: Insufficient documentation

## 2012-12-28 DIAGNOSIS — F17200 Nicotine dependence, unspecified, uncomplicated: Secondary | ICD-10-CM | POA: Insufficient documentation

## 2012-12-28 DIAGNOSIS — M79609 Pain in unspecified limb: Secondary | ICD-10-CM

## 2012-12-28 NOTE — ED Provider Notes (Signed)
CSN: [id number]     Arrival date & time 12/28/12  0755 History   First MD Initiated Contact with Patient 12/28/12 [phone]     Chief Complaint  Patient presents with  . Foot Pain  . burning to right side of body/    (Consider location/radiation/quality/duration/timing/severity/associated sxs/prior Treatment) Patient is a 62 y.o. male presenting with lower extremity pain. The history is provided by the patient.  Foot Pain Associated symptoms include chills. Pertinent negatives include no abdominal pain, chest pain, coughing, fever, headaches, nausea, neck pain, rash, sore throat or vomiting.  Jeffery [name] is a 62 y.o. male who presents to the ED with bilateral foot pain/burning. The symptoms have been off and on for about 2 months. He has been to the ED on two occasions for similar problems. Has had EKG and worked up and everything has been normal. Last night at work had a sudden "hot" spell that started in the feet and went up the left side and to chest and back. Did not have headache or feel hot on the head. Patient smokes and sometimes feel short of breath because of that but nothing different. Patient states that now he just feels a little warm on left side of chest but only minimal pain.   Past Medical History  Diagnosis Date  . Fasting hyperglycemia     Normal hemoglobin A1c of 6.0  . Hypertension   . Tobacco abuse   . Chest pain 04/2011    Normal echocardiogram   Past Surgical History  Procedure Laterality Date  . Appendectomy    . Appendectomy     Family History  Problem Relation Age of Onset  . Emphysema Father    History  Substance Use Topics  . Smoking status: Current Every Day Smoker -- 1.00 packs/day for 35 years    Types: Cigarettes  . Smokeless tobacco: Never Used  . Alcohol Use: No    Review of Systems  Constitutional: Positive for chills. Negative for fever.  HENT: Negative for ear pain, sore throat and neck pain.   Eyes: Negative for visual disturbance.   Respiratory: Positive for shortness of breath (with smoking). Negative for cough.   Cardiovascular: Positive for palpitations. Negative for chest pain and leg swelling.  Gastrointestinal: Negative for nausea, vomiting and abdominal pain.  Skin: Negative for rash.  Neurological: Negative for dizziness, syncope and headaches.  Psychiatric/Behavioral: Negative for confusion. The patient is not nervous/anxious.     Allergies  Codeine  Home Medications   Current Outpatient Rx  Name  Route  Sig  Dispense  Refill  . aspirin 81 MG tablet   Oral   Take 81 mg by mouth daily.          . chlorthalidone (HYGROTON) 25 MG tablet      1/2 tablet daily = 12.5 mg   30 tablet   6   . citalopram (CELEXA) 20 MG tablet   Oral   Take 20 mg by mouth daily.         . diphenhydrAMINE (BENADRYL) 25 mg capsule   Oral   Take 1 capsule (25 mg total) by mouth every 4 (four) hours as needed for itching.   20 capsule   0   . fexofenadine (ALLEGRA) 30 MG tablet   Oral   Take 30 mg by mouth 2 (two) times daily.         . metoprolol succinate (TOPROL-XL) 50 MG 24 hr tablet      TAKE ONE  TABLET DAILY. TAKE WITH OR IMMEDIATELY FOLLOWING A MEAL.   30 tablet   3     .Marland KitchenMarland KitchenPatient needs to contact office to schedule  Ap ...   . multivitamin (ONE-A-DAY MEN'S) TABS   Oral   Take 1 tablet by mouth daily.           . predniSONE (DELTASONE) 10 MG tablet      Take 6 tablets day one, 5 tablets day two, 4 tablets day three, 3 tablets day four, 2 tablets day five, then 1 tablet day six   21 tablet   0    BP 162/90  Pulse 72  Temp(Src) 98.1 F (36.7 C) (Oral)  Resp 17  Ht 5\' 6"  (1.676 m)  Wt 117 lb (53.071 kg)  BMI 18.89 kg/m2  SpO2 99% Physical Exam  Nursing note and vitals reviewed. Constitutional: He is oriented to person, place, and time. He appears well-developed and well-nourished. No distress.  HENT:  Head: Normocephalic and atraumatic.  Eyes: Conjunctivae and EOM are normal.  Pupils are equal, round, and reactive to light.  Neck: Normal range of motion. Neck supple.  Cardiovascular: Normal rate, regular rhythm and normal heart sounds.   Pulmonary/Chest: He has no wheezes. He has no rales.  Abdominal: Soft. Bowel sounds are normal. There is no tenderness.  Musculoskeletal: Normal range of motion. He exhibits no edema and no tenderness.  Pedal pulses present, adequate circulation, good touch sensation. Ambulatory without pain or difficulty.  Neurological: He is alert and oriented to person, place, and time. He has normal strength and normal reflexes. No cranial nerve deficit or sensory deficit. Gait normal.  Skin: Skin is warm and dry.  Psychiatric: He has a normal mood and affect. His behavior is normal.    ED Course  Procedures  Discussed with Dr. Freida Busman and then call to patient's PCP, they will see him in the office today at 11:30 am.  EKG: Normal sinus rhythm, unchanged from previous EKG MDM  62 y.o. male with burning sensation to feet and up left side of body that has been off and on x 2 months. Two previous ED visits for same. Paraesthesia Consider neuropathy. No history of Diabetes.  Patient's PCP will evaluate patient today in the office at 11:30 am.     Janne Napoleon, NP 12/28/12 [phone]

## 2012-12-28 NOTE — ED Notes (Signed)
Discharge instructions reviewed with pt, questions answered. Pt verbalized understanding.  

## 2012-12-28 NOTE — ED Notes (Signed)
Pt states ongoing burning to right side of body since last visit here on Tuesday.  Pain from right foot/leg and radiating into left shoulder, described as a burning sensation. States bilateral burning to feet since he began work at [employer] last night. Pt came here straight from work this morning.

## 2012-12-31 NOTE — ED Provider Notes (Signed)
Medical screening examination/treatment/procedure(s) were performed by non-physician practitioner and as supervising physician I was immediately available for consultation/collaboration.  Toy Baker, MD 12/31/12 2101

## 2013-04-24 ENCOUNTER — Emergency Department (HOSPITAL_COMMUNITY)
Admission: EM | Admit: 2013-04-24 | Discharge: 2013-04-24 | Disposition: A | Discharge status: Home / Self Care | Payer: BC Managed Care – PPO | Attending: Emergency Medicine | Admitting: Emergency Medicine

## 2013-04-24 ENCOUNTER — Emergency Department (HOSPITAL_COMMUNITY): Payer: BC Managed Care – PPO

## 2013-04-24 ENCOUNTER — Encounter (HOSPITAL_COMMUNITY): Payer: Self-pay | Admitting: Emergency Medicine

## 2013-04-24 DIAGNOSIS — Z7982 Long term (current) use of aspirin: Secondary | ICD-10-CM | POA: Insufficient documentation

## 2013-04-24 DIAGNOSIS — J40 Bronchitis, not specified as acute or chronic: Secondary | ICD-10-CM

## 2013-04-24 DIAGNOSIS — IMO0002 Reserved for concepts with insufficient information to code with codable children: Secondary | ICD-10-CM | POA: Insufficient documentation

## 2013-04-24 DIAGNOSIS — F17200 Nicotine dependence, unspecified, uncomplicated: Secondary | ICD-10-CM | POA: Insufficient documentation

## 2013-04-24 DIAGNOSIS — I10 Essential (primary) hypertension: Secondary | ICD-10-CM | POA: Insufficient documentation

## 2013-04-24 DIAGNOSIS — J209 Acute bronchitis, unspecified: Secondary | ICD-10-CM | POA: Insufficient documentation

## 2013-04-24 DIAGNOSIS — Z79899 Other long term (current) drug therapy: Secondary | ICD-10-CM | POA: Insufficient documentation

## 2013-04-24 MED ORDER — AZITHROMYCIN 250 MG PO TABS
500.0000 mg | ORAL_TABLET | Freq: Once | ORAL | Status: AC
  Administered 2013-04-24: 500 mg via ORAL
  Filled 2013-04-24: qty 2

## 2013-04-24 MED ORDER — ALBUTEROL SULFATE (5 MG/ML) 0.5% IN NEBU
5.0000 mg | INHALATION_SOLUTION | Freq: Once | RESPIRATORY_TRACT | Status: AC
  Administered 2013-04-24: 5 mg via RESPIRATORY_TRACT
  Filled 2013-04-24: qty 1

## 2013-04-24 MED ORDER — PREDNISONE 20 MG PO TABS: ORAL_TABLET | ORAL | Status: DC

## 2013-04-24 MED ORDER — AZITHROMYCIN 250 MG PO TABS: ORAL_TABLET | ORAL | Status: DC

## 2013-04-24 MED ORDER — IPRATROPIUM BROMIDE 0.02 % IN SOLN
0.5000 mg | Freq: Once | RESPIRATORY_TRACT | Status: AC
  Administered 2013-04-24: 0.5 mg via RESPIRATORY_TRACT
  Filled 2013-04-24: qty 2.5

## 2013-04-24 MED ORDER — PREDNISONE 50 MG PO TABS
60.0000 mg | ORAL_TABLET | Freq: Once | ORAL | Status: AC
  Administered 2013-04-24: 20:00:00 60 mg via ORAL
  Filled 2013-04-24 (×2): qty 1

## 2013-04-24 NOTE — ED Notes (Signed)
Pt with c/o SOB with wheezing today, states hx of COPD and uses inhaler for it, denies pain

## 2013-04-24 NOTE — ED Provider Notes (Signed)
CSN: [id number]     Arrival date & time 04/24/13  1849 History   First MD Initiated Contact with Patient 04/24/13 1859     Chief Complaint  Patient presents with  . Shortness of Breath   (Consider location/radiation/quality/duration/timing/severity/associated sxs/prior Treatment) HPI.... Cough for one week with associated wheezing. Patient is smoker and has COPD. No fever, sweats, chills, rusty sputum. Severity is mild to moderate. Nothing makes symptoms better or worse.  Past Medical History  Diagnosis Date  . Fasting hyperglycemia     Normal hemoglobin A1c of 6.0  . Hypertension   . Tobacco abuse   . Chest pain 04/2011    Normal echocardiogram   Past Surgical History  Procedure Laterality Date  . Appendectomy    . Appendectomy     Family History  Problem Relation Age of Onset  . Emphysema Father    History  Substance Use Topics  . Smoking status: Current Every Day Smoker -- 1.00 packs/day for 35 years    Types: Cigarettes  . Smokeless tobacco: Never Used  . Alcohol Use: No    Review of Systems  All other systems reviewed and are negative.    Allergies  Codeine  Home Medications   Current Outpatient Rx  Name  Route  Sig  Dispense  Refill  . aspirin 81 MG tablet   Oral   Take 81 mg by mouth daily.          . chlorthalidone (HYGROTON) 25 MG tablet   Oral   Take 12.5 mg by mouth daily.         . fluticasone (FLONASE) 50 MCG/ACT nasal spray   Each Nare   Place 1 spray into both nostrils daily as needed. For allergies/rhinitis         . gabapentin (NEURONTIN) 300 MG capsule   Oral   Take 300 mg by mouth 3 (three) times daily.         . metoprolol succinate (TOPROL-XL) 50 MG 24 hr tablet   Oral   Take 50 mg by mouth every morning. Take with or immediately following a meal.         . multivitamin (ONE-A-DAY MEN'S) TABS   Oral   Take 1 tablet by mouth daily.           Marland Kitchen PROAIR HFA 108 (90 BASE) MCG/ACT inhaler   Inhalation   Inhale 1  puff into the lungs every 6 (six) hours as needed. For shortness of breath//wheezing         . azithromycin (ZITHROMAX) 250 MG tablet      One po daily starting tomorrow evening   4 each   0   . predniSONE (DELTASONE) 20 MG tablet      3 tabs po day one, then 2 po daily x 4 days   11 tablet   0    BP 147/101  Pulse 67  Temp(Src) 97.3 F (36.3 C) (Oral)  Resp 18  Ht 5\' 6"  (1.676 m)  Wt 119 lb (53.978 kg)  BMI 19.22 kg/m2  SpO2 100% Physical Exam  Nursing note and vitals reviewed. Constitutional: He is oriented to person, place, and time. He appears well-developed and well-nourished.  HENT:  Head: Normocephalic and atraumatic.  Eyes: Conjunctivae and EOM are normal. Pupils are equal, round, and reactive to light.  Neck: Normal range of motion. Neck supple.  Cardiovascular: Normal rate, regular rhythm and normal heart sounds.   Pulmonary/Chest: Effort normal.  Bilateral expiratory wheeze  Abdominal: Soft. Bowel sounds are normal.  Musculoskeletal: Normal range of motion.  Neurological: He is alert and oriented to person, place, and time.  Skin: Skin is warm and dry.  Psychiatric: He has a normal mood and affect. His behavior is normal.    ED Course  Procedures (including critical care time) Labs Review Labs Reviewed - No data to display Imaging Review Dg Chest 2 View  04/24/2013   CLINICAL DATA:  Shortness of breath.  EXAM: CHEST  2 VIEW  COMPARISON:  PA and lateral chest 04/06/2011.  FINDINGS: The lungs are emphysematous but are clear. Heart size is normal. No pneumothorax or pleural fluid. No focal bony abnormality.  IMPRESSION: Emphysema without acute disease.   Electronically Signed   By: Drusilla Kanner M.D.   On: 04/24/2013 19:48    EKG Interpretation   None       MDM   1. Bronchitis    Patient is in no acute distress. He feels much better after breathing treatment. Chest x-ray negative. Rx Zithromax and prednisone.    Donnetta Hutching, MD 04/24/13  2056

## 2013-04-24 NOTE — ED Notes (Addendum)
Pt reporting SOB "about all week".  States that he does have occasional cough.  Denies cough being productive.  No distress noted.

## 2014-04-10 ENCOUNTER — Encounter (HOSPITAL_COMMUNITY): Payer: Self-pay | Admitting: Cardiovascular Disease

## 2014-04-19 ENCOUNTER — Emergency Department (HOSPITAL_COMMUNITY)
Admission: EM | Admit: 2014-04-19 | Discharge: 2014-04-20 | Disposition: A | Discharge status: Home / Self Care | Payer: BC Managed Care – PPO | Attending: Emergency Medicine | Admitting: Emergency Medicine

## 2014-04-19 ENCOUNTER — Emergency Department (HOSPITAL_COMMUNITY): Payer: BC Managed Care – PPO

## 2014-04-19 ENCOUNTER — Encounter (HOSPITAL_COMMUNITY): Payer: Self-pay | Admitting: *Deleted

## 2014-04-19 DIAGNOSIS — J441 Chronic obstructive pulmonary disease with (acute) exacerbation: Secondary | ICD-10-CM

## 2014-04-19 DIAGNOSIS — Z7952 Long term (current) use of systemic steroids: Secondary | ICD-10-CM | POA: Diagnosis not present

## 2014-04-19 DIAGNOSIS — Z7982 Long term (current) use of aspirin: Secondary | ICD-10-CM | POA: Insufficient documentation

## 2014-04-19 DIAGNOSIS — Z792 Long term (current) use of antibiotics: Secondary | ICD-10-CM | POA: Diagnosis not present

## 2014-04-19 DIAGNOSIS — Z79899 Other long term (current) drug therapy: Secondary | ICD-10-CM | POA: Insufficient documentation

## 2014-04-19 DIAGNOSIS — R0602 Shortness of breath: Secondary | ICD-10-CM | POA: Diagnosis present

## 2014-04-19 DIAGNOSIS — I10 Essential (primary) hypertension: Secondary | ICD-10-CM | POA: Insufficient documentation

## 2014-04-19 DIAGNOSIS — Z87891 Personal history of nicotine dependence: Secondary | ICD-10-CM | POA: Diagnosis not present

## 2014-04-19 DIAGNOSIS — J449 Chronic obstructive pulmonary disease, unspecified: Secondary | ICD-10-CM

## 2014-04-19 DIAGNOSIS — R0600 Dyspnea, unspecified: Secondary | ICD-10-CM

## 2014-04-19 HISTORY — DX: Chronic obstructive pulmonary disease, unspecified: J44.9

## 2014-04-19 LAB — CBC WITH DIFFERENTIAL/PLATELET
BASOS ABS: 0 10*3/uL (ref 0.0–0.1)
Basophils Relative: 0 % (ref 0–1)
EOS PCT: 10 % — AB (ref 0–5)
Eosinophils Absolute: 1 10*3/uL — ABNORMAL HIGH (ref 0.0–0.7)
HEMATOCRIT: 38.2 % — AB (ref 39.0–52.0)
Hemoglobin: 12.8 g/dL — ABNORMAL LOW (ref 13.0–17.0)
LYMPHS ABS: 2 10*3/uL (ref 0.7–4.0)
LYMPHS PCT: 20 % (ref 12–46)
MCH: 28.7 pg (ref 26.0–34.0)
MCHC: 33.5 g/dL (ref 30.0–36.0)
MCV: 85.7 fL (ref 78.0–100.0)
MONO ABS: 0.8 10*3/uL (ref 0.1–1.0)
Monocytes Relative: 9 % (ref 3–12)
NEUTROS ABS: 6.1 10*3/uL (ref 1.7–7.7)
Neutrophils Relative %: 61 % (ref 43–77)
Platelets: 350 10*3/uL (ref 150–400)
RBC: 4.46 MIL/uL (ref 4.22–5.81)
RDW: 14.4 % (ref 11.5–15.5)
WBC: 9.9 10*3/uL (ref 4.0–10.5)

## 2014-04-19 LAB — BASIC METABOLIC PANEL
ANION GAP: 13 (ref 5–15)
BUN: 15 mg/dL (ref 6–23)
CALCIUM: 9.7 mg/dL (ref 8.4–10.5)
CHLORIDE: 95 meq/L — AB (ref 96–112)
CO2: 29 meq/L (ref 19–32)
Creatinine, Ser: 1.16 mg/dL (ref 0.50–1.35)
GFR calc Af Amer: 77 mL/min — ABNORMAL LOW (ref >90)
GFR calc non Af Amer: 66 mL/min — ABNORMAL LOW (ref >90)
GLUCOSE: 116 mg/dL — AB (ref 70–99)
Potassium: 3.9 mEq/L (ref 3.7–5.3)
Sodium: 137 mEq/L (ref 137–147)

## 2014-04-19 LAB — TROPONIN I: Troponin I: <0.3 ng/mL (ref <0.30)

## 2014-04-19 MED ORDER — PREDNISONE 50 MG PO TABS
60.0000 mg | ORAL_TABLET | Freq: Once | ORAL | Status: AC
  Administered 2014-04-19: 60 mg via ORAL
  Filled 2014-04-19 (×2): qty 1

## 2014-04-19 MED ORDER — IPRATROPIUM-ALBUTEROL 0.5-2.5 (3) MG/3ML IN SOLN
3.0000 mL | Freq: Once | RESPIRATORY_TRACT | Status: AC
  Administered 2014-04-19: 3 mL via RESPIRATORY_TRACT
  Filled 2014-04-19: qty 3

## 2014-04-19 NOTE — ED Notes (Signed)
COPD exacerbation began today.  Had same last weekend, [facility]  Given nebs + steroids.

## 2014-04-19 NOTE — ED Notes (Signed)
Pt here for SOB, denies at present

## 2014-04-19 NOTE — ED Provider Notes (Signed)
TIME SEEN: 10:48 PM   CHIEF COMPLAINT: SOB  HPI: Patient is a 63 y.o. male with a history of COPD, hypertension who presents to the ED complaining of moderate SOB. Patient states that he had a COPD exacerbation episode that began earlier today. He states he had the same symptoms 1 week ago and [facility]. He states he finished prednisone yesterday. No fevers or cough. No lower extremity swelling or pain. Denies chest pain.Marland Kitchen. He tried using a breathing treatment at home prior to arrival with significant relief. States he is alert he feeling much better.   ROS: See HPI Constitutional: no fever  Eyes: no drainage  ENT: no runny nose   Cardiovascular:  no chest pain  Resp: SOB  GI: no vomiting GU: no dysuria Integumentary: no rash  Allergy: no hives  Musculoskeletal: no leg swelling  Neurological: no slurred speech ROS otherwise negative  PAST MEDICAL HISTORY/PAST SURGICAL HISTORY:  Past Medical History  Diagnosis Date  . Fasting hyperglycemia     Normal hemoglobin A1c of 6.0  . Hypertension   . Tobacco abuse   . Chest pain 04/2011    Normal echocardiogram  . COPD (chronic obstructive pulmonary disease)     MEDICATIONS:  Prior to Admission medications   Medication Sig Start Date End Date Taking? Authorizing Provider  albuterol (PROVENTIL HFA;VENTOLIN HFA) 108 (90 BASE) MCG/ACT inhaler Inhale 2 puffs into the lungs every 6 (six) hours as needed for wheezing or shortness of breath.   Yes Historical Provider, MD  albuterol (PROVENTIL) (2.5 MG/3ML) 0.083% nebulizer solution Take 2.5 mg by nebulization 2 (two) times daily.   Yes Historical Provider, MD  aspirin 81 MG tablet Take 81 mg by mouth daily.    Yes Historical Provider, MD  chlorthalidone (HYGROTON) 25 MG tablet Take 12.5 mg by mouth daily.   Yes Historical Provider, MD  EFFERVESCENT POT CHLORIDE, 25, 25 MEQ TBEF Take 25 mEq by mouth daily. 04/15/14  Yes Historical Provider, MD  gabapentin (NEURONTIN) 300 MG capsule  Take 300 mg by mouth 2 (two) times daily.  03/22/13  Yes Historical Provider, MD  isosorbide mononitrate (IMDUR) 30 MG 24 hr tablet Take 30 mg by mouth daily. 03/22/14  Yes Historical Provider, MD  metoprolol succinate (TOPROL-XL) 50 MG 24 hr tablet Take 50 mg by mouth every morning. Take with or immediately following a meal.   Yes Historical Provider, MD  multivitamin (ONE-A-DAY MEN'S) TABS Take 1 tablet by mouth daily.     Yes Historical Provider, MD  simvastatin (ZOCOR) 20 MG tablet Take 20 mg by mouth daily. 03/22/14  Yes Historical Provider, MD  azithromycin (ZITHROMAX) 250 MG tablet One po daily starting tomorrow evening Patient not taking: Reported on 04/19/2014 04/24/13   Donnetta HutchingBrian Cook, MD  predniSONE (DELTASONE) 20 MG tablet 3 tabs po day one, then 2 po daily x 4 days Patient not taking: Reported on 04/19/2014 04/24/13   Donnetta HutchingBrian Cook, MD    ALLERGIES:  Allergies  Allergen Reactions  . Codeine Nausea Only    SOCIAL HISTORY:  History  Substance Use Topics  . Smoking status: Former Smoker -- 1.00 packs/day for 35 years    Types: Cigarettes    Quit date: 05/09/2013  . Smokeless tobacco: Never Used  . Alcohol Use: No    FAMILY HISTORY: Family History  Problem Relation Age of Onset  . Emphysema Father     EXAM:  Triage Vitals: BP 163/96 mmHg  Pulse 83  Temp(Src) 97.9 F (36.6  C) (Oral)  Resp 20  Ht 5\' 6"  (1.676 m)  Wt 123 lb (55.792 kg)  BMI 19.86 kg/m2  SpO2 98%  CONSTITUTIONAL: Alert and oriented and responds appropriately to questions. Well-appearing; well-nourished HEAD: Normocephalic EYES: Conjunctivae clear, PERRL ENT: normal nose; no rhinorrhea; moist mucous membranes; pharynx without lesions noted NECK: Supple, no meningismus, no LAD  CARD: RRR; S1 and S2 appreciated; no murmurs, no clicks, no rubs, no gallops RESP: Normal chest excursion without splinting or tachypnea; breath sounds clear and equal bilaterally; he is slightly diminished breath sounds at his  bases bilaterally but no extra wheezing, rhonchi or rales, no hypoxia or respiratory distress, no increased work of breathing ABD/GI: Normal bowel sounds; non-distended; soft, non-tender, no rebound, no guarding BACK:  The back appears normal and is non-tender to palpation, there is no CVA tenderness EXT: Normal ROM in all joints; non-tender to palpation; no edema; normal capillary refill; no cyanosis; no calf tenderness or swelling  SKIN: Normal color for age and race; warm NEURO: Moves all extremities equally PSYCH: The patient's mood and manner are appropriate. Grooming and personal hygiene are appropriate.  MEDICAL DECISION MAKING: Patient here with what he feels is a mild COPD exacerbation. He states he used a nebulizer treatment home prior to arrival 130 feeling better. His EKG is nonischemic and shows no significant change compared to prior. No hypoxia or respiratory distress. No increased work of breathing. We'll give DuoNeb, prednisone. Will obtain chest x-ray, troponin. Anticipate if workup is negative that he will be discharged home. No prior history of PE or DVT.  ED PROGRESS: Patient's labs are unremarkable. Troponin negative. Chest x-ray clear. Patient reports feeling much better. Better aeration after DuoNeb. He has an albuterol inhaler, albuterol nebulizer treatments. We'll restart steroids for a longer more prolonged course. Have advised him to follow-up with his PCP. Discussed return precautions. He verbalizes understanding and is comfortable with plan.      EKG Interpretation  Date/Time:  Saturday April 19 2014 23:10:41 EST Ventricular Rate:  76 PR Interval:  199 QRS Duration: 96 QT Interval:  409 QTC Calculation: 460 R Axis:   76 Text Interpretation:  Sinus rhythm Nonspecific ST abnormality No significant change since last tracing Confirmed by Zyan Mirkin,  DO, Shalla Bulluck ([id number]) on 04/19/2014 11:15:03 PM        I personally performed the services described in this  documentation, which was scribed in my presence. The recorded information has been reviewed and is accurate.   Layla MawKristen N Flavius Repsher, DO 04/20/14 0002

## 2014-04-19 NOTE — ED Notes (Signed)
MD at bedside. 

## 2014-04-20 MED ORDER — PREDNISONE 20 MG PO TABS: 20.0000 mg | ORAL_TABLET | Freq: Every day | ORAL | Status: DC

## 2014-04-20 NOTE — Discharge Instructions (Signed)

## 2014-05-05 ENCOUNTER — Emergency Department (HOSPITAL_COMMUNITY)
Admission: EM | Admit: 2014-05-05 | Discharge: 2014-05-05 | Disposition: A | Discharge status: Home / Self Care | Payer: BLUE CROSS/BLUE SHIELD | Attending: Emergency Medicine | Admitting: Emergency Medicine

## 2014-05-05 ENCOUNTER — Encounter (HOSPITAL_COMMUNITY): Payer: Self-pay | Admitting: Emergency Medicine

## 2014-05-05 ENCOUNTER — Emergency Department (HOSPITAL_COMMUNITY): Payer: BLUE CROSS/BLUE SHIELD

## 2014-05-05 DIAGNOSIS — J449 Chronic obstructive pulmonary disease, unspecified: Secondary | ICD-10-CM | POA: Insufficient documentation

## 2014-05-05 DIAGNOSIS — R079 Chest pain, unspecified: Secondary | ICD-10-CM | POA: Diagnosis present

## 2014-05-05 DIAGNOSIS — I10 Essential (primary) hypertension: Secondary | ICD-10-CM | POA: Insufficient documentation

## 2014-05-05 DIAGNOSIS — Z7982 Long term (current) use of aspirin: Secondary | ICD-10-CM | POA: Diagnosis not present

## 2014-05-05 DIAGNOSIS — E785 Hyperlipidemia, unspecified: Secondary | ICD-10-CM | POA: Insufficient documentation

## 2014-05-05 DIAGNOSIS — Z792 Long term (current) use of antibiotics: Secondary | ICD-10-CM | POA: Insufficient documentation

## 2014-05-05 DIAGNOSIS — K219 Gastro-esophageal reflux disease without esophagitis: Secondary | ICD-10-CM | POA: Diagnosis not present

## 2014-05-05 DIAGNOSIS — Z87891 Personal history of nicotine dependence: Secondary | ICD-10-CM | POA: Insufficient documentation

## 2014-05-05 DIAGNOSIS — Z79899 Other long term (current) drug therapy: Secondary | ICD-10-CM | POA: Insufficient documentation

## 2014-05-05 HISTORY — DX: Hyperlipidemia, unspecified: E78.5

## 2014-05-05 LAB — TROPONIN I: Troponin I: <0.03 ng/mL (ref <0.031)

## 2014-05-05 LAB — COMPREHENSIVE METABOLIC PANEL
ALBUMIN: 3.8 g/dL (ref 3.5–5.2)
ALK PHOS: 71 U/L (ref 39–117)
ALT: 31 U/L (ref 0–53)
AST: 23 U/L (ref 0–37)
Anion gap: 5 (ref 5–15)
BUN: 12 mg/dL (ref 6–23)
CO2: 32 mmol/L (ref 19–32)
Calcium: 9.2 mg/dL (ref 8.4–10.5)
Chloride: 99 mEq/L (ref 96–112)
Creatinine, Ser: 0.81 mg/dL (ref 0.50–1.35)
GFR calc Af Amer: >90 mL/min (ref >90)
GFR calc non Af Amer: >90 mL/min (ref >90)
Glucose, Bld: 101 mg/dL — ABNORMAL HIGH (ref 70–99)
POTASSIUM: 4.5 mmol/L (ref 3.5–5.1)
SODIUM: 136 mmol/L (ref 135–145)
TOTAL PROTEIN: 6.5 g/dL (ref 6.0–8.3)
Total Bilirubin: 0.5 mg/dL (ref 0.3–1.2)

## 2014-05-05 LAB — CBC WITH DIFFERENTIAL/PLATELET
BASOS ABS: 0 10*3/uL (ref 0.0–0.1)
Basophils Relative: 0 % (ref 0–1)
EOS ABS: 0.5 10*3/uL (ref 0.0–0.7)
Eosinophils Relative: 6 % — ABNORMAL HIGH (ref 0–5)
HCT: 39.9 % (ref 39.0–52.0)
Hemoglobin: 13.1 g/dL (ref 13.0–17.0)
Lymphocytes Relative: 22 % (ref 12–46)
Lymphs Abs: 1.8 10*3/uL (ref 0.7–4.0)
MCH: 28.9 pg (ref 26.0–34.0)
MCHC: 32.8 g/dL (ref 30.0–36.0)
MCV: 87.9 fL (ref 78.0–100.0)
Monocytes Absolute: 1 10*3/uL (ref 0.1–1.0)
Monocytes Relative: 12 % (ref 3–12)
NEUTROS PCT: 60 % (ref 43–77)
Neutro Abs: 5.1 10*3/uL (ref 1.7–7.7)
PLATELETS: 264 10*3/uL (ref 150–400)
RBC: 4.54 MIL/uL (ref 4.22–5.81)
RDW: 15.9 % — AB (ref 11.5–15.5)
WBC: 8.4 10*3/uL (ref 4.0–10.5)

## 2014-05-05 MED ORDER — PANTOPRAZOLE SODIUM 20 MG PO TBEC: 20.0000 mg | DELAYED_RELEASE_TABLET | Freq: Every day | ORAL | Status: DC

## 2014-05-05 NOTE — ED Notes (Signed)
Pt alert & oriented x4, stable gait. Patient given discharge instructions, paperwork & prescription(s). Patient  instructed to stop at the registration desk to finish any additional paperwork. Patient verbalized understanding. Pt left department w/ no further questions. 

## 2014-05-05 NOTE — ED Provider Notes (Signed)
CSN: [id number]     Arrival date & time 05/05/14  1151 History  This chart was scribed for Benny Lennert, MD by Ronney Lion, ED Scribe. This patient was seen in room APA08/AP[facility]08 and the patient's care was started at 1:35 PM.    Chief Complaint  Patient presents with  . Chest Pain   Patient is a 64 y.o. male presenting with chest pain. The history is provided by the patient. No language interpreter was used.  Chest Pain Pain location:  Substernal area Pain quality: burning and pressure   Pain radiates to:  Does not radiate Pain radiates to the back: no   Timing:  Intermittent Relieved by: baking soda. Worsened by:  Nothing tried Ineffective treatments:  None tried Associated symptoms: no abdominal pain, no back pain, no cough, no fatigue and no headache     HPI Comments: Jeffery [name] is a 64 y.o. male who presents to the Emergency Department complaining of center chest pain that began when patient woke up this morning. He states his pain is intermittent, and denies pain presently. Patient licked baking soda, which alleviated his symptoms, but it soon returned. Dr. Olena Leatherwood is his PCP.    Past Medical History  Diagnosis Date  . Fasting hyperglycemia     Normal hemoglobin A1c of 6.0  . Hypertension   . Tobacco abuse   . Chest pain 04/2011    Normal echocardiogram  . COPD (chronic obstructive pulmonary disease)   . Hyperlipemia    Past Surgical History  Procedure Laterality Date  . Appendectomy    . Appendectomy    . Left heart catheterization with coronary angiogram N/A 05/17/2011    Procedure: LEFT HEART CATHETERIZATION WITH CORONARY ANGIOGRAM;  Surgeon: Tonny Bollman, MD;  Location: [facility] CATH LAB;  Service: Cardiovascular;  Laterality: N/A;   Family History  Problem Relation Age of Onset  . Emphysema Father    History  Substance Use Topics  . Smoking status: Former Smoker -- 1.00 packs/day for 35 years    Types: Cigarettes    Quit date: 05/09/2013  . Smokeless  tobacco: Never Used  . Alcohol Use: No    Review of Systems  Constitutional: Negative for appetite change and fatigue.  HENT: Negative for congestion, ear discharge and sinus pressure.   Eyes: Negative for discharge.  Respiratory: Negative for cough.   Cardiovascular: Negative for chest pain.  Gastrointestinal: Negative for abdominal pain and diarrhea.  Genitourinary: Negative for frequency and hematuria.  Musculoskeletal: Negative for back pain.  Skin: Negative for rash.  Neurological: Negative for seizures and headaches.  Psychiatric/Behavioral: Negative for hallucinations.      Allergies  Codeine  Home Medications   Prior to Admission medications   Medication Sig Start Date End Date Taking? Authorizing Provider  albuterol (PROVENTIL HFA;VENTOLIN HFA) 108 (90 BASE) MCG/ACT inhaler Inhale 2 puffs into the lungs every 6 (six) hours as needed for wheezing or shortness of breath.    Historical Provider, MD  albuterol (PROVENTIL) (2.5 MG/3ML) 0.083% nebulizer solution Take 2.5 mg by nebulization 2 (two) times daily.    Historical Provider, MD  aspirin 81 MG tablet Take 81 mg by mouth daily.     Historical Provider, MD  azithromycin (ZITHROMAX) 250 MG tablet One po daily starting tomorrow evening Patient not taking: Reported on 04/19/2014 04/24/13   Donnetta Hutching, MD  chlorthalidone (HYGROTON) 25 MG tablet Take 12.5 mg by mouth daily.    Historical Provider, MD  EFFERVESCENT POT CHLORIDE, 25, 25  MEQ TBEF Take 25 mEq by mouth daily. 04/15/14   Historical Provider, MD  gabapentin (NEURONTIN) 300 MG capsule Take 300 mg by mouth 2 (two) times daily.  03/22/13   Historical Provider, MD  isosorbide mononitrate (IMDUR) 30 MG 24 hr tablet Take 30 mg by mouth daily. 03/22/14   Historical Provider, MD  metoprolol succinate (TOPROL-XL) 50 MG 24 hr tablet Take 50 mg by mouth every morning. Take with or immediately following a meal.    Historical Provider, MD  multivitamin (ONE-A-DAY MEN'S) TABS  Take 1 tablet by mouth daily.      Historical Provider, MD  predniSONE (DELTASONE) 20 MG tablet Take 1 tablet (20 mg total) by mouth daily with breakfast. Take 3 tablets a day for four days then 2 tablets a day for three days then 1 tablet a day for three days then 1/2 tablet for four days then stop 04/20/14   Kristen N Ward, DO  simvastatin (ZOCOR) 20 MG tablet Take 20 mg by mouth daily. 03/22/14   Historical Provider, MD   BP 130/95 mmHg  Pulse 68  Temp(Src) 97.9 F (36.6 C) (Oral)  Resp 18  Ht  (1.676 m)  Wt 123 lb (55.792 kg)  BMI 19.86 kg/m2  SpO2 100% Physical Exam  Constitutional: He is oriented to person, place, and time. He appears well-developed.  HENT:  Head: Normocephalic.  Eyes: Conjunctivae and EOM are normal. No scleral icterus.  Neck: Neck supple. No thyromegaly present.  Cardiovascular: Normal rate and regular rhythm.  Exam reveals no gallop and no friction rub.   No murmur heard. Pulmonary/Chest: No stridor. He has no wheezes. He has no rales. He exhibits no tenderness.  Abdominal: He exhibits no distension. There is no tenderness. There is no rebound.  Musculoskeletal: Normal range of motion. He exhibits no edema.  Lymphadenopathy:    He has no cervical adenopathy.  Neurological: He is oriented to person, place, and time. He exhibits normal muscle tone. Coordination normal.  Skin: No rash noted. No erythema.  Psychiatric: He has a normal mood and affect. His behavior is normal.  Nursing note and vitals reviewed.   ED Course  Procedures (including critical care time) Labs Review Labs Reviewed  CBC WITH DIFFERENTIAL - Abnormal; Notable for the following:    RDW 15.9 (*)    Eosinophils Relative 6 (*)    All other components within normal limits  TROPONIN I  COMPREHENSIVE METABOLIC PANEL    Imaging Review Dg Chest 2 View  05/05/2014   CLINICAL DATA:  64 year old male with history of high blood pressure, tobacco use and hyperlipidemia presenting with  chest pain. Initial encounter.  EXAM: CHEST  2 VIEW  COMPARISON:  04/19/2014 and 04/20/2013  FINDINGS: Chronic lung changes without segmental infiltrate, pulmonary edema or pneumothorax.  Mildly tortuous aorta most notable involving ascending aspect without change.  Heart size within normal limits.  No plain film evidence of pulmonary malignancy.  Mild degenerative changes thoracic spine.  IMPRESSION: Chronic lung changes without segmental infiltrate, pulmonary edema or pneumothorax.  Mildly tortuous aorta most notable involving ascending aspect without change.   Electronically Signed   By: Bridgett Larsson M.D.   On: 05/05/2014 12:50     EKG Interpretation   Date/Time:  Monday May 05 2014 12:08:26 EST Ventricular Rate:  65 PR Interval:  158 QRS Duration: 86 QT Interval:  388 QTC Calculation: 403 R Axis:   79 Text Interpretation:  Normal sinus rhythm Possible Left atrial enlargement  Borderline  ECG Confirmed by Chattie Greeson  MD, Jomarie Longs [phone]) on 05/05/2014 1:25:07  PM      MDM   Final diagnoses:  Chest pain    Gerd.   protonix and follow up   The chart was scribed for me under my direct supervision.  I personally performed the history, physical, and medical decision making and all procedures in the evaluation of this patient.Benny Lennert, MD 05/05/14 [phone]

## 2014-05-05 NOTE — ED Notes (Signed)
PT c/o pressure and burning to the center of his chest this morning. PT states pain is intermittent and denies any pain at this time. PT states hx of acid reflux.

## 2014-05-05 NOTE — Discharge Instructions (Signed)
Follow up with your md in one week 

## 2014-05-28 ENCOUNTER — Encounter (HOSPITAL_COMMUNITY): Payer: Self-pay | Admitting: Cardiology

## 2014-05-28 ENCOUNTER — Emergency Department (HOSPITAL_COMMUNITY): Payer: BLUE CROSS/BLUE SHIELD

## 2014-05-28 ENCOUNTER — Emergency Department (HOSPITAL_COMMUNITY)
Admission: EM | Admit: 2014-05-28 | Discharge: 2014-05-28 | Disposition: A | Discharge status: Home / Self Care | Payer: BLUE CROSS/BLUE SHIELD | Attending: Emergency Medicine | Admitting: Emergency Medicine

## 2014-05-28 DIAGNOSIS — Z9861 Coronary angioplasty status: Secondary | ICD-10-CM | POA: Insufficient documentation

## 2014-05-28 DIAGNOSIS — Z87891 Personal history of nicotine dependence: Secondary | ICD-10-CM | POA: Diagnosis not present

## 2014-05-28 DIAGNOSIS — E785 Hyperlipidemia, unspecified: Secondary | ICD-10-CM | POA: Insufficient documentation

## 2014-05-28 DIAGNOSIS — J441 Chronic obstructive pulmonary disease with (acute) exacerbation: Secondary | ICD-10-CM | POA: Diagnosis not present

## 2014-05-28 DIAGNOSIS — Z7982 Long term (current) use of aspirin: Secondary | ICD-10-CM | POA: Insufficient documentation

## 2014-05-28 DIAGNOSIS — I10 Essential (primary) hypertension: Secondary | ICD-10-CM | POA: Insufficient documentation

## 2014-05-28 DIAGNOSIS — R0602 Shortness of breath: Secondary | ICD-10-CM | POA: Diagnosis present

## 2014-05-28 DIAGNOSIS — Z79899 Other long term (current) drug therapy: Secondary | ICD-10-CM | POA: Diagnosis not present

## 2014-05-28 MED ORDER — PREDNISONE 20 MG PO TABS: 60.0000 mg | ORAL_TABLET | Freq: Every day | ORAL | Status: DC

## 2014-05-28 MED ORDER — ALBUTEROL SULFATE (2.5 MG/3ML) 0.083% IN NEBU
2.5000 mg | INHALATION_SOLUTION | Freq: Once | RESPIRATORY_TRACT | Status: AC
  Administered 2014-05-28: 2.5 mg via RESPIRATORY_TRACT
  Filled 2014-05-28: qty 3

## 2014-05-28 MED ORDER — DOXYCYCLINE HYCLATE 100 MG PO CAPS: 100.0000 mg | ORAL_CAPSULE | Freq: Two times a day (BID) | ORAL | Status: DC

## 2014-05-28 MED ORDER — IPRATROPIUM-ALBUTEROL 0.5-2.5 (3) MG/3ML IN SOLN
3.0000 mL | Freq: Once | RESPIRATORY_TRACT | Status: AC
  Administered 2014-05-28: 3 mL via RESPIRATORY_TRACT
  Filled 2014-05-28: qty 3

## 2014-05-28 NOTE — ED Provider Notes (Signed)
CSN: [id number]     Arrival date & time 05/28/14  1153 History  This chart was scribed for Gilda Crease, by Milly Jakob, ED Scribe. The patient was seen in room APA09/APA09. Patient's care was started at 11:58 AM.     Chief Complaint  Patient presents with  . Shortness of Breath   The history is provided by the patient. No language interpreter was used.   HPI Comments: Jeffery [name] is a 64 y.o. male who with a history of COPD presents to the Emergency Department complaining of SOB and wheezing which began on Sunday and became acutely worse last night at work. He reports associated chest tightness. He states that he used his rescue inhalers 6-7 times at home with minimal relief. He denies cough.   Past Medical History  Diagnosis Date  . Fasting hyperglycemia     Normal hemoglobin A1c of 6.0  . Hypertension   . Tobacco abuse   . Chest pain 04/2011    Normal echocardiogram  . COPD (chronic obstructive pulmonary disease)   . Hyperlipemia    Past Surgical History  Procedure Laterality Date  . Appendectomy    . Appendectomy    . Left heart catheterization with coronary angiogram N/A 05/17/2011    Procedure: LEFT HEART CATHETERIZATION WITH CORONARY ANGIOGRAM;  Surgeon: Tonny Bollman, MD;  Location: [facility] CATH LAB;  Service: Cardiovascular;  Laterality: N/A;   Family History  Problem Relation Age of Onset  . Emphysema Father    History  Substance Use Topics  . Smoking status: Former Smoker -- 1.00 packs/day for 35 years    Types: Cigarettes    Quit date: 05/09/2013  . Smokeless tobacco: Never Used  . Alcohol Use: No    Review of Systems  Respiratory: Positive for shortness of breath and wheezing. Negative for cough.   All other systems reviewed and are negative.   Allergies  Codeine  Home Medications   Prior to Admission medications   Medication Sig Start Date End Date Taking? Authorizing Provider  albuterol (PROVENTIL HFA;VENTOLIN HFA) 108 (90 BASE)  MCG/ACT inhaler Inhale 2 puffs into the lungs every 6 (six) hours as needed for wheezing or shortness of breath.    Historical Provider, MD  albuterol (PROVENTIL) (2.5 MG/3ML) 0.083% nebulizer solution Take 2.5 mg by nebulization 2 (two) times daily.    Historical Provider, MD  aspirin EC 81 MG tablet Take 81 mg by mouth daily.    Historical Provider, MD  azithromycin (ZITHROMAX) 250 MG tablet One po daily starting tomorrow evening Patient not taking: Reported on 04/19/2014 04/24/13   Donnetta Hutching, MD  chlorthalidone (HYGROTON) 50 MG tablet Take 50 mg by mouth daily.    Historical Provider, MD  EFFERVESCENT POT CHLORIDE, 25, 25 MEQ TBEF Take 25 mEq by mouth daily. 04/15/14   Historical Provider, MD  gabapentin (NEURONTIN) 300 MG capsule Take 300 mg by mouth 2 (two) times daily.  03/22/13   Historical Provider, MD  isosorbide mononitrate (IMDUR) 30 MG 24 hr tablet Take 30 mg by mouth daily. 03/22/14   Historical Provider, MD  metoprolol succinate (TOPROL-XL) 50 MG 24 hr tablet Take 50 mg by mouth every morning. Take with or immediately following a meal.    Historical Provider, MD  multivitamin (ONE-A-DAY MEN'S) TABS Take 1 tablet by mouth daily.      Historical Provider, MD  pantoprazole (PROTONIX) 20 MG tablet Take 1 tablet (20 mg total) by mouth daily. 05/05/14   Benny Lennert,  MD  predniSONE (DELTASONE) 20 MG tablet Take 1 tablet (20 mg total) by mouth daily with breakfast. Take 3 tablets a day for four days then 2 tablets a day for three days then 1 tablet a day for three days then 1/2 tablet for four days then stop Patient not taking: Reported on 05/05/2014 04/20/14   Kristen N Ward, DO  simvastatin (ZOCOR) 20 MG tablet Take 20 mg by mouth daily. 03/22/14   Historical Provider, MD   Triage Vitals: BP 138/48 mmHg  Pulse 78  Temp(Src) 98 F (36.7 C) (Oral)  Resp 20  Ht 5\' 6"  (1.676 m)  Wt 128 lb (58.06 kg)  BMI 20.67 kg/m2  SpO2 97% Physical Exam  Constitutional: He is oriented to person,  place, and time. He appears well-developed and well-nourished. No distress.  HENT:  Head: Normocephalic and atraumatic.  Right Ear: Hearing normal.  Left Ear: Hearing normal.  Nose: Nose normal.  Mouth/Throat: Oropharynx is clear and moist and mucous membranes are normal.  Eyes: Conjunctivae and EOM are normal. Pupils are equal, round, and reactive to light.  Neck: Normal range of motion. Neck supple.  Cardiovascular: Regular rhythm, S1 normal and S2 normal.  Exam reveals no gallop and no friction rub.   No murmur heard. Pulmonary/Chest: Effort normal and breath sounds normal. No respiratory distress. He exhibits no tenderness.  Breath sounds very diminished bilaterally, with some wheezing at both bases  Abdominal: Soft. Normal appearance and bowel sounds are normal. There is no hepatosplenomegaly. There is no tenderness. There is no rebound, no guarding, no tenderness at McBurney's point and negative Murphy's sign. No hernia.  Musculoskeletal: Normal range of motion.  Neurological: He is alert and oriented to person, place, and time. He has normal strength. No cranial nerve deficit or sensory deficit. Coordination normal. GCS eye subscore is 4. GCS verbal subscore is 5. GCS motor subscore is 6.  Skin: Skin is warm, dry and intact. No rash noted. No cyanosis.  Psychiatric: He has a normal mood and affect. His speech is normal and behavior is normal. Thought content normal.  Nursing note and vitals reviewed.   ED Course  Procedures (including critical care time) DIAGNOSTIC STUDIES: Oxygen Saturation is 97% on room air, normal by my interpretation.    COORDINATION OF CARE: 12:01 PM-Discussed treatment plan which includes CXR and a breathing treatment with pt at bedside and pt agreed to plan.   Labs Review Labs Reviewed - No data to display  Imaging Review Dg Chest 2 View  05/28/2014   CLINICAL DATA:  64 year old male shortness of Breath. Personal history of former smoker. Initial  encounter.  EXAM: CHEST  2 VIEW  COMPARISON:  05/05/2014 and earlier.  FINDINGS: Stable hyperinflation. Normal cardiac size and mediastinal contours. Visualized tracheal air column is within normal limits. Stable mild diffuse increased interstitial markings with no pneumothorax, pulmonary edema, pleural effusion or confluent pulmonary opacity. Flowing osteophytes in the thoracic spine. No acute osseous abnormality identified.  IMPRESSION: Chronic lung disease.  No acute cardiopulmonary abnormality.   Electronically Signed   By: Augusto GambleLee  Hall M.D.   On: 05/28/2014 13:13     EKG Interpretation None      MDM   Final diagnoses:  Shortness of breath   patient with previous history of COPD presents to the ER for evaluation of shortness of breath for several days. Patient has been experiencing increased bronchospasm. Examination today reveals significantly diminished breath sounds bilaterally with wheezing. He is not, however, hypoxic. Chest  x-ray does not show any evidence of pneumonia. He is not expressing chest pain, no concern for cardiac etiology. Patient's air movement improved with nebulizer treatment. Will treat with doxycycline, prednisone in addition to dilator therapy. Follow-up with primary doctor, return to ER if symptoms worsen.  I personally performed the services described in this documentation, which was scribed in my presence. The recorded information has been reviewed and is accurate.     Gilda Crease, MD 05/28/14 [phone]

## 2014-05-28 NOTE — Discharge Instructions (Signed)

## 2014-05-28 NOTE — ED Notes (Signed)
Sob since last night at work.

## 2014-08-11 ENCOUNTER — Emergency Department (HOSPITAL_COMMUNITY)
Admission: EM | Admit: 2014-08-11 | Discharge: 2014-08-12 | Disposition: A | Discharge status: Home / Self Care | Payer: BLUE CROSS/BLUE SHIELD | Attending: Emergency Medicine | Admitting: Emergency Medicine

## 2014-08-11 ENCOUNTER — Encounter (HOSPITAL_COMMUNITY): Payer: Self-pay | Admitting: Emergency Medicine

## 2014-08-11 DIAGNOSIS — E785 Hyperlipidemia, unspecified: Secondary | ICD-10-CM | POA: Insufficient documentation

## 2014-08-11 DIAGNOSIS — Z7982 Long term (current) use of aspirin: Secondary | ICD-10-CM | POA: Insufficient documentation

## 2014-08-11 DIAGNOSIS — R0602 Shortness of breath: Secondary | ICD-10-CM | POA: Diagnosis present

## 2014-08-11 DIAGNOSIS — J441 Chronic obstructive pulmonary disease with (acute) exacerbation: Secondary | ICD-10-CM | POA: Diagnosis not present

## 2014-08-11 DIAGNOSIS — Z87891 Personal history of nicotine dependence: Secondary | ICD-10-CM | POA: Insufficient documentation

## 2014-08-11 DIAGNOSIS — I10 Essential (primary) hypertension: Secondary | ICD-10-CM | POA: Insufficient documentation

## 2014-08-11 DIAGNOSIS — Z79899 Other long term (current) drug therapy: Secondary | ICD-10-CM | POA: Diagnosis not present

## 2014-08-11 NOTE — ED Provider Notes (Signed)
CSN: [id number]     Arrival date & time 08/11/14  2207 History  This chart was scribed for Paula LibraJohn Cisco Kindt, MD by Murriel HopperAlec Bankhead, ED Scribe. This patient was seen in room APA08/APA08 and the patient's care was started at 11:07 PM.    Chief Complaint  Patient presents with  . Shortness of Breath      Patient is a 64 y.o. male presenting with shortness of breath. No language interpreter was used.  Shortness of Breath    HPI Comments: Jeffery [name] is a 64 y.o. male with COPD who presents to the Emergency Department complaining of SOB with associated cough that hs been present since yesterday. Pt reports that he feels his COPD is flaring up again. Pt reports that his SOB worsens with physical activity but denies orthopnea. Symptoms are moderate. Pt denies nausea, vomiting, diarrhea, chest pain or fever. He has not gotten adequate relief with his home inhalers.    Past Medical History  Diagnosis Date  . Fasting hyperglycemia     Normal hemoglobin A1c of 6.0  . Hypertension   . Tobacco abuse   . Chest pain 04/2011    Normal echocardiogram  . COPD (chronic obstructive pulmonary disease)   . Hyperlipemia    Past Surgical History  Procedure Laterality Date  . Appendectomy    . Appendectomy    . Left heart catheterization with coronary angiogram N/A 05/17/2011    Procedure: LEFT HEART CATHETERIZATION WITH CORONARY ANGIOGRAM;  Surgeon: Tonny BollmanMichael Cooper, MD;  [facility];  Service: Cardiovascular;  Laterality: N/A;   Family History  Problem Relation Age of Onset  . Emphysema Father    History  Substance Use Topics  . Smoking status: Former Smoker -- 1.00 packs/day for 35 years    Types: Cigarettes    Quit date: 05/09/2013  . Smokeless tobacco: Never Used  . Alcohol Use: No    Review of Systems  All other systems reviewed and are negative.   Allergies  Codeine  Home Medications   Prior to Admission medications   Medication Sig Start Date End Date Taking?  Authorizing Provider  albuterol (PROVENTIL HFA;VENTOLIN HFA) 108 (90 BASE) MCG/ACT inhaler Inhale 2 puffs into the lungs every 6 (six) hours as needed for wheezing or shortness of breath.   Yes Historical Provider, MD  aspirin EC 81 MG tablet Take 81 mg by mouth daily.   Yes Historical Provider, MD  budesonide-formoterol (SYMBICORT) 160-4.5 MCG/ACT inhaler Inhale 2 puffs into the lungs 2 (two) times daily.   Yes Historical Provider, MD  chlorthalidone (HYGROTON) 25 MG tablet Take 25 mg by mouth daily.    Yes Historical Provider, MD  gabapentin (NEURONTIN) 300 MG capsule Take 300 mg by mouth 2 (two) times daily.  03/22/13  Yes Historical Provider, MD  ipratropium-albuterol (DUONEB) 0.5-2.5 (3) MG/3ML SOLN Inhale 3 mLs into the lungs every 6 (six) hours as needed (for shortness of breath/wheezing).  07/17/14  Yes Historical Provider, MD  isosorbide mononitrate (IMDUR) 30 MG 24 hr tablet Take 30 mg by mouth daily. 03/22/14  Yes Historical Provider, MD  metoprolol succinate (TOPROL-XL) 50 MG 24 hr tablet Take 50 mg by mouth every morning. Take with or immediately following a meal.   Yes Historical Provider, MD  multivitamin (ONE-A-DAY MEN'S) TABS Take 1 tablet by mouth daily.     Yes Historical Provider, MD  polyethylene glycol powder (GLYCOLAX/MIRALAX) powder Take 17 g by mouth daily. 08/06/14  Yes Historical Provider, MD  potassium chloride  SA (K-DUR,KLOR-CON) 20 MEQ tablet Take 1 tablet by mouth daily. 05/12/14  Yes Historical Provider, MD  ranitidine (ZANTAC) 300 MG tablet Take 300 mg by mouth at bedtime.   Yes Historical Provider, MD  simvastatin (ZOCOR) 20 MG tablet Take 20 mg by mouth at bedtime.  03/22/14  Yes Historical Provider, MD  tiotropium (SPIRIVA) 18 MCG inhalation capsule Place 18 mcg into inhaler and inhale every morning.   Yes Historical Provider, MD  doxycycline (VIBRAMYCIN) 100 MG capsule Take 1 capsule (100 mg total) by mouth 2 (two) times daily. Patient not taking: Reported on  08/11/2014 05/28/14   Gilda Crease, MD  pantoprazole (PROTONIX) 20 MG tablet Take 1 tablet (20 mg total) by mouth daily. Patient not taking: Reported on 08/11/2014 05/05/14   Bethann Berkshire, MD  predniSONE (DELTASONE) 20 MG tablet Take 3 tablets (60 mg total) by mouth daily with breakfast. Patient not taking: Reported on 08/11/2014 05/28/14   Gilda Crease, MD   BP 139/91 mmHg  Pulse 94  Temp(Src) 98.2 F (36.8 C) (Oral)  Resp 20  Ht  (1.676 m)  Wt 129 lb (58.514 kg)  BMI 20.83 kg/m2  SpO2 98%   Physical Exam General: Well-developed, well-nourished male in no acute distress; appearance consistent with age of record HENT: normocephalic; atraumatic; multiple missing teeth and has multiple dental caries  Eyes: pupils equal, round and reactive to light; extraocular muscles intact Neck: supple Heart: regular rate and rhythm Lungs: clear to auscultation bilaterally but distant sounds Abdomen: soft; nondistended; nontender; no masses or hepatosplenomegaly; bowel sounds present Extremities: No deformity; full range of motion; pulses normal Neurologic: Awake, alert and oriented; motor function intact in all extremities and symmetric; no facial droop Skin: Warm and dry Psychiatric: Normal mood and affect   ED Course  Procedures (including critical care time)  DIAGNOSTIC STUDIES: Oxygen Saturation is 98% on room air, normal by my interpretation.    COORDINATION OF CARE: 11:11 PM Discussed treatment plan with pt at bedside and pt agreed to plan.    MDM  1:20 AM Feels better after albuterol and Atrovent neb treatment. He does not need refills of his inhalers.  I personally performed the services described in this documentation, which was scribed in my presence. The recorded information has been reviewed and is accurate.     Paula Libra, MD 08/12/14 [phone]

## 2014-08-11 NOTE — ED Notes (Signed)
Patient reports shortness of breath since yesterday. Patient states "I feel like my COPD is acting up." Patient reports has been using ventolin and symbicort inhalers and has had some relief.

## 2014-08-12 MED ORDER — IPRATROPIUM-ALBUTEROL 0.5-2.5 (3) MG/3ML IN SOLN
3.0000 mL | Freq: Once | RESPIRATORY_TRACT | Status: AC
  Administered 2014-08-12: 3 mL via RESPIRATORY_TRACT
  Filled 2014-08-12: qty 3

## 2014-08-12 NOTE — ED Notes (Signed)
Patient verbalizes understanding of discharge instructions, home care, and follow up care. Patient ambulatory out of department at this time with family. 

## 2014-11-11 ENCOUNTER — Emergency Department (HOSPITAL_COMMUNITY): Payer: BLUE CROSS/BLUE SHIELD

## 2014-11-11 ENCOUNTER — Emergency Department (HOSPITAL_COMMUNITY)
Admission: EM | Admit: 2014-11-11 | Discharge: 2014-11-11 | Disposition: A | Discharge status: Home / Self Care | Payer: BLUE CROSS/BLUE SHIELD | Attending: Emergency Medicine | Admitting: Emergency Medicine

## 2014-11-11 ENCOUNTER — Encounter (HOSPITAL_COMMUNITY): Payer: Self-pay | Admitting: Emergency Medicine

## 2014-11-11 DIAGNOSIS — Z79899 Other long term (current) drug therapy: Secondary | ICD-10-CM | POA: Insufficient documentation

## 2014-11-11 DIAGNOSIS — J441 Chronic obstructive pulmonary disease with (acute) exacerbation: Secondary | ICD-10-CM | POA: Diagnosis not present

## 2014-11-11 DIAGNOSIS — I10 Essential (primary) hypertension: Secondary | ICD-10-CM | POA: Diagnosis not present

## 2014-11-11 DIAGNOSIS — Z7982 Long term (current) use of aspirin: Secondary | ICD-10-CM | POA: Insufficient documentation

## 2014-11-11 DIAGNOSIS — Z7951 Long term (current) use of inhaled steroids: Secondary | ICD-10-CM | POA: Insufficient documentation

## 2014-11-11 DIAGNOSIS — Z87891 Personal history of nicotine dependence: Secondary | ICD-10-CM | POA: Insufficient documentation

## 2014-11-11 DIAGNOSIS — R0602 Shortness of breath: Secondary | ICD-10-CM | POA: Diagnosis present

## 2014-11-11 DIAGNOSIS — E785 Hyperlipidemia, unspecified: Secondary | ICD-10-CM | POA: Insufficient documentation

## 2014-11-11 LAB — CBC WITH DIFFERENTIAL/PLATELET
BASOS ABS: 0 10*3/uL (ref 0.0–0.1)
BASOS PCT: 0 % (ref 0–1)
EOS ABS: 2.1 10*3/uL — AB (ref 0.0–0.7)
EOS PCT: 17 % — AB (ref 0–5)
HEMATOCRIT: 40.8 % (ref 39.0–52.0)
Hemoglobin: 13.5 g/dL (ref 13.0–17.0)
LYMPHS PCT: 23 % (ref 12–46)
Lymphs Abs: 2.9 10*3/uL (ref 0.7–4.0)
MCH: 29 pg (ref 26.0–34.0)
MCHC: 33.1 g/dL (ref 30.0–36.0)
MCV: 87.6 fL (ref 78.0–100.0)
Monocytes Absolute: 0.9 10*3/uL (ref 0.1–1.0)
Monocytes Relative: 7 % (ref 3–12)
NEUTROS ABS: 6.5 10*3/uL (ref 1.7–7.7)
Neutrophils Relative %: 53 % (ref 43–77)
PLATELETS: 276 10*3/uL (ref 150–400)
RBC: 4.66 MIL/uL (ref 4.22–5.81)
RDW: 15 % (ref 11.5–15.5)
WBC: 12.4 10*3/uL — AB (ref 4.0–10.5)

## 2014-11-11 LAB — COMPREHENSIVE METABOLIC PANEL
ALBUMIN: 4.5 g/dL (ref 3.5–5.0)
ALK PHOS: 74 U/L (ref 38–126)
ALT: 16 U/L — AB (ref 17–63)
AST: 21 U/L (ref 15–41)
Anion gap: 13 (ref 5–15)
BILIRUBIN TOTAL: 0.9 mg/dL (ref 0.3–1.2)
BUN: 9 mg/dL (ref 6–20)
CALCIUM: 9.5 mg/dL (ref 8.9–10.3)
CHLORIDE: 99 mmol/L — AB (ref 101–111)
CO2: 28 mmol/L (ref 22–32)
CREATININE: 0.93 mg/dL (ref 0.61–1.24)
GFR calc Af Amer: >60 mL/min (ref >60)
GFR calc non Af Amer: >60 mL/min (ref >60)
Glucose, Bld: 124 mg/dL — ABNORMAL HIGH (ref 65–99)
POTASSIUM: 3.3 mmol/L — AB (ref 3.5–5.1)
Sodium: 140 mmol/L (ref 135–145)
Total Protein: 7.7 g/dL (ref 6.5–8.1)

## 2014-11-11 LAB — TROPONIN I: Troponin I: <0.03 ng/mL (ref <0.031)

## 2014-11-11 LAB — BRAIN NATRIURETIC PEPTIDE: B Natriuretic Peptide: 24 pg/mL (ref 0.0–100.0)

## 2014-11-11 MED ORDER — METHYLPREDNISOLONE SODIUM SUCC 125 MG IJ SOLR
125.0000 mg | Freq: Once | INTRAMUSCULAR | Status: AC
  Administered 2014-11-11: 125 mg via INTRAVENOUS
  Filled 2014-11-11: qty 2

## 2014-11-11 MED ORDER — ALBUTEROL SULFATE (2.5 MG/3ML) 0.083% IN NEBU
2.5000 mg | INHALATION_SOLUTION | Freq: Once | RESPIRATORY_TRACT | Status: AC
  Administered 2014-11-11: 2.5 mg via RESPIRATORY_TRACT
  Filled 2014-11-11: qty 3

## 2014-11-11 MED ORDER — IPRATROPIUM-ALBUTEROL 0.5-2.5 (3) MG/3ML IN SOLN
3.0000 mL | Freq: Once | RESPIRATORY_TRACT | Status: AC
  Administered 2014-11-11: 3 mL via RESPIRATORY_TRACT
  Filled 2014-11-11: qty 3

## 2014-11-11 MED ORDER — IPRATROPIUM-ALBUTEROL 0.5-2.5 (3) MG/3ML IN SOLN
RESPIRATORY_TRACT | Status: AC
  Administered 2014-11-11: 3 mL via RESPIRATORY_TRACT
  Filled 2014-11-11: qty 3

## 2014-11-11 MED ORDER — ALBUTEROL (5 MG/ML) CONTINUOUS INHALATION SOLN
INHALATION_SOLUTION | RESPIRATORY_TRACT | Status: AC
  Administered 2014-11-11: 2.5 mg
  Filled 2014-11-11: qty 20

## 2014-11-11 MED ORDER — IPRATROPIUM-ALBUTEROL 0.5-2.5 (3) MG/3ML IN SOLN
3.0000 mL | Freq: Once | RESPIRATORY_TRACT | Status: AC
  Administered 2014-11-11: 3 mL via RESPIRATORY_TRACT

## 2014-11-11 MED ORDER — ALBUTEROL SULFATE (2.5 MG/3ML) 0.083% IN NEBU: 2.5000 mg | INHALATION_SOLUTION | Freq: Once | RESPIRATORY_TRACT | Status: DC

## 2014-11-11 NOTE — ED Notes (Signed)
RT called

## 2014-11-11 NOTE — ED Notes (Signed)
Pt c/o increasing sob x 2 days. H/s COPD. Purse lipped breathing/audible wheezing. Pt appears anxious.

## 2014-11-11 NOTE — ED Provider Notes (Signed)
CSN: [id number]     Arrival date & time 11/11/14  0715 History   First MD Initiated Contact with Patient 11/11/14 0719     Chief Complaint  Patient presents with  . Shortness of Breath     (Consider location/radiation/quality/duration/timing/severity/associated sxs/prior Treatment) Patient is a 64 y.o. male presenting with shortness of breath. The history is provided by the patient (the pt states he is sob.  pt has copd and was started on doxy yesterday).  Shortness of Breath Severity:  Moderate Onset quality:  Sudden Timing:  Intermittent Progression:  Waxing and waning Chronicity:  Recurrent Context: activity   Associated symptoms: wheezing   Associated symptoms: no abdominal pain, no chest pain, no cough, no headaches and no rash     Past Medical History  Diagnosis Date  . Fasting hyperglycemia     Normal hemoglobin A1c of 6.0  . Hypertension   . Tobacco abuse   . Chest pain 04/2011    Normal echocardiogram  . COPD (chronic obstructive pulmonary disease)   . Hyperlipemia    Past Surgical History  Procedure Laterality Date  . Appendectomy    . Appendectomy    . Left heart catheterization with coronary angiogram N/A 05/17/2011    Procedure: LEFT HEART CATHETERIZATION WITH CORONARY ANGIOGRAM;  Surgeon: Tonny Bollman, MD;  Location: [facility] CATH LAB;  Service: Cardiovascular;  Laterality: N/A;   Family History  Problem Relation Age of Onset  . Emphysema Father    History  Substance Use Topics  . Smoking status: Former Smoker -- 1.00 packs/day for 35 years    Types: Cigarettes    Quit date: 05/09/2013  . Smokeless tobacco: Never Used  . Alcohol Use: No    Review of Systems  Constitutional: Negative for appetite change and fatigue.  HENT: Negative for congestion, ear discharge and sinus pressure.   Eyes: Negative for discharge.  Respiratory: Positive for shortness of breath and wheezing. Negative for cough.   Cardiovascular: Negative for chest pain.   Gastrointestinal: Negative for abdominal pain and diarrhea.  Genitourinary: Negative for frequency and hematuria.  Musculoskeletal: Negative for back pain.  Skin: Negative for rash.  Neurological: Negative for seizures and headaches.  Psychiatric/Behavioral: Negative for hallucinations.      Allergies  Codeine  Home Medications   Prior to Admission medications   Medication Sig Start Date End Date Taking? Authorizing Provider  albuterol (PROVENTIL HFA;VENTOLIN HFA) 108 (90 BASE) MCG/ACT inhaler Inhale 2 puffs into the lungs every 6 (six) hours as needed for wheezing or shortness of breath.   Yes Historical Provider, MD  aspirin EC 81 MG tablet Take 81 mg by mouth daily.   Yes Historical Provider, MD  budesonide-formoterol (SYMBICORT) 160-4.5 MCG/ACT inhaler Inhale 2 puffs into the lungs 2 (two) times daily.   Yes Historical Provider, MD  chlorthalidone (HYGROTON) 25 MG tablet Take 25 mg by mouth daily.    Yes Historical Provider, MD  doxycycline (VIBRA-TABS) 100 MG tablet Take 1 tablet by mouth 2 (two) times daily. Starting 11/10/2014 x 10 days. 11/10/14  Yes Historical Provider, MD  gabapentin (NEURONTIN) 300 MG capsule Take 300 mg by mouth 2 (two) times daily.  03/22/13  Yes Historical Provider, MD  ipratropium-albuterol (DUONEB) 0.5-2.5 (3) MG/3ML SOLN Inhale 3 mLs into the lungs every 6 (six) hours as needed (for shortness of breath/wheezing).  07/17/14  Yes Historical Provider, MD  isosorbide mononitrate (IMDUR) 30 MG 24 hr tablet Take 30 mg by mouth daily. 03/22/14  Yes Historical Provider, MD  lansoprazole (PREVACID) 30 MG capsule Take 1 capsule by mouth daily. 10/17/14  Yes Historical Provider, MD  metoprolol succinate (TOPROL-XL) 50 MG 24 hr tablet Take 50 mg by mouth every morning. Take with or immediately following a meal.   Yes Historical Provider, MD  multivitamin (ONE-A-DAY MEN'S) TABS Take 1 tablet by mouth daily.     Yes Historical Provider, MD  polyethylene glycol powder  (GLYCOLAX/MIRALAX) powder Take 17 g by mouth daily. 08/06/14  Yes Historical Provider, MD  potassium chloride SA (K-DUR,KLOR-CON) 20 MEQ tablet Take 1 tablet by mouth daily. 05/12/14  Yes Historical Provider, MD  predniSONE (DELTASONE) 5 MG tablet Take 1 tablet by mouth daily. 11/04/14  Yes Historical Provider, MD  ranitidine (ZANTAC) 300 MG tablet Take 300 mg by mouth at bedtime.   Yes Historical Provider, MD  simvastatin (ZOCOR) 20 MG tablet Take 20 mg by mouth at bedtime.  03/22/14  Yes Historical Provider, MD  tiotropium (SPIRIVA) 18 MCG inhalation capsule Place 18 mcg into inhaler and inhale every morning.   Yes Historical Provider, MD  pantoprazole (PROTONIX) 20 MG tablet Take 1 tablet (20 mg total) by mouth daily. Patient not taking: Reported on 08/11/2014 05/05/14   Bethann Berkshire, MD   BP 109/89 mmHg  Pulse 88  Temp(Src) 97.7 F (36.5 C)  Resp 15  Ht 5\' 6"  (1.676 m)  Wt 123 lb (55.792 kg)  BMI 19.86 kg/m2  SpO2 100% Physical Exam  Constitutional: He is oriented to person, place, and time. He appears well-developed.  HENT:  Head: Normocephalic.  Eyes: Conjunctivae and EOM are normal. No scleral icterus.  Neck: Neck supple. No thyromegaly present.  Cardiovascular: Normal rate and regular rhythm.  Exam reveals no gallop and no friction rub.   No murmur heard. Pulmonary/Chest: No stridor. He has wheezes. He has no rales. He exhibits no tenderness.  Abdominal: He exhibits no distension. There is no tenderness. There is no rebound.  Musculoskeletal: Normal range of motion. He exhibits no edema.  Lymphadenopathy:    He has no cervical adenopathy.  Neurological: He is oriented to person, place, and time. He exhibits normal muscle tone. Coordination normal.  Skin: No rash noted. No erythema.  Psychiatric: He has a normal mood and affect. His behavior is normal.    ED Course  Procedures (including critical care time) Labs Review Labs Reviewed  CBC WITH DIFFERENTIAL/PLATELET - Abnormal;  Notable for the following:    WBC 12.4 (*)    Eosinophils Relative 17 (*)    Eosinophils Absolute 2.1 (*)    All other components within normal limits  COMPREHENSIVE METABOLIC PANEL - Abnormal; Notable for the following:    Potassium 3.3 (*)    Chloride 99 (*)    Glucose, Bld 124 (*)    ALT 16 (*)    All other components within normal limits  TROPONIN I  BRAIN NATRIURETIC PEPTIDE    Imaging Review Dg Chest Portable 1 View  11/11/2014   CLINICAL DATA:  Three-day history of cough and shortness of breath. COPD. Hypertension.  EXAM: PORTABLE CHEST - 1 VIEW  COMPARISON:  October 22, 2014  FINDINGS: There is evidence of bullous disease in the upper lobes. There is no edema or consolidation. The heart size is normal. The pulmonary vascular is stable and reflects underlying emphysematous change. No adenopathy. No bone lesions.  IMPRESSION: Upper lobe bullous disease, stable. No edema or consolidation. No appreciable change compared to prior study.   Electronically Signed   By: Chrissie Noa  Margarita GrizzleWoodruff III M.D.   On: 11/11/2014 07:58     EKG Interpretation None      MDM   Final diagnoses:  COPD exacerbation    Pt with sob,  Exacerbation of copd,  Pt with nl troponin and bnp, doubt cad.   Pt improved with neb tx.  Pt on antibiotics from pcp yesterday.  Will increase prednisone and follow up with pcp in 2-3 days    Bethann BerkshireJoseph Francena Zender, MD 11/11/14 1135

## 2014-11-11 NOTE — Discharge Instructions (Signed)
Increase prednisone to 2 pills a day.  Follow up with your md Thursday or Friday.  Return if any problems

## 2015-02-21 ENCOUNTER — Emergency Department (HOSPITAL_COMMUNITY): Payer: BLUE CROSS/BLUE SHIELD

## 2015-02-21 ENCOUNTER — Encounter (HOSPITAL_COMMUNITY): Payer: Self-pay | Admitting: *Deleted

## 2015-02-21 ENCOUNTER — Emergency Department (HOSPITAL_COMMUNITY)
Admission: EM | Admit: 2015-02-21 | Discharge: 2015-02-22 | Disposition: A | Discharge status: Home / Self Care | Payer: BLUE CROSS/BLUE SHIELD | Attending: Emergency Medicine | Admitting: Emergency Medicine

## 2015-02-21 DIAGNOSIS — Z87891 Personal history of nicotine dependence: Secondary | ICD-10-CM | POA: Insufficient documentation

## 2015-02-21 DIAGNOSIS — Z7952 Long term (current) use of systemic steroids: Secondary | ICD-10-CM | POA: Diagnosis not present

## 2015-02-21 DIAGNOSIS — Z79899 Other long term (current) drug therapy: Secondary | ICD-10-CM | POA: Insufficient documentation

## 2015-02-21 DIAGNOSIS — J441 Chronic obstructive pulmonary disease with (acute) exacerbation: Secondary | ICD-10-CM | POA: Insufficient documentation

## 2015-02-21 DIAGNOSIS — I10 Essential (primary) hypertension: Secondary | ICD-10-CM | POA: Insufficient documentation

## 2015-02-21 DIAGNOSIS — Z7951 Long term (current) use of inhaled steroids: Secondary | ICD-10-CM | POA: Insufficient documentation

## 2015-02-21 DIAGNOSIS — Z7982 Long term (current) use of aspirin: Secondary | ICD-10-CM | POA: Diagnosis not present

## 2015-02-21 DIAGNOSIS — E785 Hyperlipidemia, unspecified: Secondary | ICD-10-CM | POA: Insufficient documentation

## 2015-02-21 DIAGNOSIS — R0602 Shortness of breath: Secondary | ICD-10-CM | POA: Diagnosis present

## 2015-02-21 MED ORDER — METHYLPREDNISOLONE SODIUM SUCC 125 MG IJ SOLR
125.0000 mg | Freq: Once | INTRAMUSCULAR | Status: AC
  Administered 2015-02-21: 125 mg via INTRAMUSCULAR
  Filled 2015-02-21: qty 2

## 2015-02-21 MED ORDER — IPRATROPIUM-ALBUTEROL 0.5-2.5 (3) MG/3ML IN SOLN
3.0000 mL | RESPIRATORY_TRACT | Status: AC
  Administered 2015-02-21 (×2): 3 mL via RESPIRATORY_TRACT
  Filled 2015-02-21 (×2): qty 3

## 2015-02-21 NOTE — ED Notes (Addendum)
Pt c/o increased sob over the last week that gets worse when he is up walking. Pt has taken 2 breathing treatments with no relief tonight. Pt aso c/o stomach pain and decreased appetite.

## 2015-02-21 NOTE — ED Provider Notes (Signed)
TIME SEEN: 11:12 PM   CHIEF COMPLAINT: Shortness of Breath  HPI:  Jeffery [name] is a 64 y.o. male with a hx of COPD, HTN, HLD who presents to the Emergency Department complaining of gradually worsening intermittent SOB onset last week. He reports associated mild non-productive cough and wheezing.  He denies any fever or CP.  He states that he has been using his nebulizer increasingly, especially in the morning before he is able to take his medication. He denies currently taking steroids. He denies any hx of PE/DVT or CAD, CHF. No lower extremity swelling. States this feels exactly like his COPD exacerbations. He denies being on oxygen at home.   ROS: See HPI Constitutional: no fever  Eyes: no drainage  ENT: no runny nose   Cardiovascular:  no chest pain  Resp: SOB  GI: no vomiting GU: no dysuria Integumentary: no rash  Allergy: no hives  Musculoskeletal: no leg swelling  Neurological: no slurred speech ROS otherwise negative  PAST MEDICAL HISTORY/PAST SURGICAL HISTORY:  Past Medical History  Diagnosis Date  . Fasting hyperglycemia     Normal hemoglobin A1c of 6.0  . Hypertension   . Tobacco abuse   . Chest pain 04/2011    Normal echocardiogram  . COPD (chronic obstructive pulmonary disease) (HCC)   . Hyperlipemia     MEDICATIONS:  Prior to Admission medications   Medication Sig Start Date End Date Taking? Authorizing Provider  albuterol (PROVENTIL HFA;VENTOLIN HFA) 108 (90 BASE) MCG/ACT inhaler Inhale 2 puffs into the lungs every 6 (six) hours as needed for wheezing or shortness of breath.    Historical Provider, MD  aspirin EC 81 MG tablet Take 81 mg by mouth daily.    Historical Provider, MD  budesonide-formoterol (SYMBICORT) 160-4.5 MCG/ACT inhaler Inhale 2 puffs into the lungs 2 (two) times daily.    Historical Provider, MD  chlorthalidone (HYGROTON) 25 MG tablet Take 25 mg by mouth daily.     Historical Provider, MD  doxycycline (VIBRA-TABS) 100 MG tablet Take 1  tablet by mouth 2 (two) times daily. Starting 11/10/2014 x 10 days. 11/10/14   Historical Provider, MD  gabapentin (NEURONTIN) 300 MG capsule Take 300 mg by mouth 2 (two) times daily.  03/22/13   Historical Provider, MD  ipratropium-albuterol (DUONEB) 0.5-2.5 (3) MG/3ML SOLN Inhale 3 mLs into the lungs every 6 (six) hours as needed (for shortness of breath/wheezing).  07/17/14   Historical Provider, MD  isosorbide mononitrate (IMDUR) 30 MG 24 hr tablet Take 30 mg by mouth daily. 03/22/14   Historical Provider, MD  lansoprazole (PREVACID) 30 MG capsule Take 1 capsule by mouth daily. 10/17/14   Historical Provider, MD  metoprolol succinate (TOPROL-XL) 50 MG 24 hr tablet Take 50 mg by mouth every morning. Take with or immediately following a meal.    Historical Provider, MD  multivitamin (ONE-A-DAY MEN'S) TABS Take 1 tablet by mouth daily.      Historical Provider, MD  pantoprazole (PROTONIX) 20 MG tablet Take 1 tablet (20 mg total) by mouth daily. Patient not taking: Reported on 08/11/2014 05/05/14   Bethann Berkshire, MD  polyethylene glycol powder (GLYCOLAX/MIRALAX) powder Take 17 g by mouth daily. 08/06/14   Historical Provider, MD  potassium chloride SA (K-DUR,KLOR-CON) 20 MEQ tablet Take 1 tablet by mouth daily. 05/12/14   Historical Provider, MD  predniSONE (DELTASONE) 5 MG tablet Take 1 tablet by mouth daily. 11/04/14   Historical Provider, MD  ranitidine (ZANTAC) 300 MG tablet Take 300 mg by mouth  at bedtime.    Historical Provider, MD  simvastatin (ZOCOR) 20 MG tablet Take 20 mg by mouth at bedtime.  03/22/14   Historical Provider, MD  tiotropium (SPIRIVA) 18 MCG inhalation capsule Place 18 mcg into inhaler and inhale every morning.    Historical Provider, MD    ALLERGIES:  Allergies  Allergen Reactions  . Codeine Nausea Only    SOCIAL HISTORY:  Social History  Substance Use Topics  . Smoking status: Former Smoker -- 1.00 packs/day for 35 years    Types: Cigarettes    Quit date: 05/09/2013  .  Smokeless tobacco: Never Used  . Alcohol Use: No    FAMILY HISTORY: Family History  Problem Relation Age of Onset  . Emphysema Father     EXAM: BP 142/100 mmHg  Pulse 91  Temp(Src) 97.5 F (36.4 C) (Oral)  Resp 24  Ht 5\' 6"  (1.676 m)  Wt 125 lb (56.7 kg)  BMI 20.19 kg/m2  SpO2 99% CONSTITUTIONAL: Alert and oriented and responds appropriately to questions. Well-appearing; well-nourished HEAD: Normocephalic EYES: Conjunctivae clear, PERRL ENT: normal nose; no rhinorrhea; moist mucous membranes; pharynx without lesions noted NECK: Supple, no meningismus, no LAD  CARD: RRR; S1 and S2 appreciated; no murmurs, no clicks, no rubs, no gallops RESP: Normal chest excursion without splinting or tachypnea; breath sounds equal bilaterally; mild expiratory wheezing diffusely and diminished at bilateral bases, no rhonchi, no rales, no hypoxia or respiratory distress, speaking full sentences ABD/GI: Normal bowel sounds; non-distended; soft, non-tender, no rebound, no guarding, no peritoneal signs BACK:  The back appears normal and is non-tender to palpation, there is no CVA tenderness EXT: Normal ROM in all joints; non-tender to palpation; no edema; normal capillary refill; no cyanosis, no calf tenderness or swelling    SKIN: Normal color for age and race; warm NEURO: Moves all extremities equally, sensation to light touch intact diffusely, cranial nerves II through XII intact, ambulatory without any difficulty or ataxic gait PSYCH: The patient's mood and manner are appropriate. Grooming and personal hygiene are appropriate.  MEDICAL DECISION MAKING: Patient here with likely COPD exacerbation. EKG shows no new ischemic changes. He has nonspecific ST flattening diffusely. We'll obtain labs, chest x-ray. We'll give duo nebs, solu-Medrol. He is in no respiratory distress. Anticipate patient will be discharged home.  ED PROGRESS: Labs are unremarkable including negative troponin. Chest x-ray clear.  Patient reports feeling much better after 2 duo nebs. He's been able to ambulate with oxygen saturations between 94-97%. Wheezing has now resolved. Better aeration on exam. Will discharge on prednisone burst. He states he has albuterol for his nebulizer machine and has inhalers. Have discussed return precautions. He verbalizes understanding and is comfortable with this plan.     EKG Interpretation  Date/Time:  Saturday February 21 2015 23:35:14 EDT Ventricular Rate:  82 PR Interval:  159 QRS Duration: 104 QT Interval:  391 QTC Calculation: 457 R Axis:   75 Text Interpretation:  Sinus rhythm Probable left atrial enlargement Borderline T wave abnormalities No significant change since last tracing Jan 2016 Confirmed by Elesa MassedWARD,  DO, Wyley Hack [id number]) on 02/21/2015 11:42:14 PM         By signing my name below, I, Emmanuella Mensah, attest that this documentation has been prepared under the direction and in the presence of Janitza Revuelta N Quinntin Malter, DO. Electronically Signed: Angelene GiovanniEmmanuella Mensah, ED Scribe. 02/21/2015. 11:22 PM.   Layla MawKristen N Domonic Kimball, DO 02/22/15 [id number]

## 2015-02-22 LAB — CBC WITH DIFFERENTIAL/PLATELET
BASOS ABS: 0 10*3/uL (ref 0.0–0.1)
BASOS PCT: 0 %
EOS ABS: 1 10*3/uL — AB (ref 0.0–0.7)
Eosinophils Relative: 14 %
HCT: 39 % (ref 39.0–52.0)
HEMOGLOBIN: 13 g/dL (ref 13.0–17.0)
LYMPHS ABS: 1.2 10*3/uL (ref 0.7–4.0)
Lymphocytes Relative: 16 %
MCH: 29.1 pg (ref 26.0–34.0)
MCHC: 33.3 g/dL (ref 30.0–36.0)
MCV: 87.4 fL (ref 78.0–100.0)
Monocytes Absolute: 0.6 10*3/uL (ref 0.1–1.0)
Monocytes Relative: 8 %
NEUTROS PCT: 62 %
Neutro Abs: 4.5 10*3/uL (ref 1.7–7.7)
Platelets: 301 10*3/uL (ref 150–400)
RBC: 4.46 MIL/uL (ref 4.22–5.81)
RDW: 13.4 % (ref 11.5–15.5)
WBC: 7.3 10*3/uL (ref 4.0–10.5)

## 2015-02-22 LAB — BASIC METABOLIC PANEL
ANION GAP: 13 (ref 5–15)
BUN: 7 mg/dL (ref 6–20)
CALCIUM: 9.5 mg/dL (ref 8.9–10.3)
CHLORIDE: 95 mmol/L — AB (ref 101–111)
CO2: 27 mmol/L (ref 22–32)
Creatinine, Ser: 0.8 mg/dL (ref 0.61–1.24)
Glucose, Bld: 126 mg/dL — ABNORMAL HIGH (ref 65–99)
POTASSIUM: 3.1 mmol/L — AB (ref 3.5–5.1)
SODIUM: 135 mmol/L (ref 135–145)

## 2015-02-22 LAB — TROPONIN I

## 2015-02-22 MED ORDER — POTASSIUM CHLORIDE CRYS ER 20 MEQ PO TBCR
40.0000 meq | EXTENDED_RELEASE_TABLET | Freq: Once | ORAL | Status: AC
  Administered 2015-02-22: 40 meq via ORAL
  Filled 2015-02-22: qty 2

## 2015-02-22 MED ORDER — PREDNISONE 20 MG PO TABS: 60.0000 mg | ORAL_TABLET | Freq: Every day | ORAL | Status: DC

## 2015-02-22 NOTE — Discharge Instructions (Signed)

## 2015-02-22 NOTE — ED Notes (Signed)
Patient ambulated to restroom. Once back in room ambulated patient around nursing station. O2 saturation readings between 94 to 97 percent on room air.

## 2015-03-07 ENCOUNTER — Emergency Department (HOSPITAL_COMMUNITY)
Admission: EM | Admit: 2015-03-07 | Discharge: 2015-03-08 | Disposition: A | Discharge status: Home / Self Care | Payer: BLUE CROSS/BLUE SHIELD | Attending: Emergency Medicine | Admitting: Emergency Medicine

## 2015-03-07 ENCOUNTER — Emergency Department (HOSPITAL_COMMUNITY): Payer: BLUE CROSS/BLUE SHIELD

## 2015-03-07 ENCOUNTER — Encounter (HOSPITAL_COMMUNITY): Payer: Self-pay

## 2015-03-07 DIAGNOSIS — Z7952 Long term (current) use of systemic steroids: Secondary | ICD-10-CM | POA: Insufficient documentation

## 2015-03-07 DIAGNOSIS — Z79899 Other long term (current) drug therapy: Secondary | ICD-10-CM | POA: Insufficient documentation

## 2015-03-07 DIAGNOSIS — J441 Chronic obstructive pulmonary disease with (acute) exacerbation: Secondary | ICD-10-CM

## 2015-03-07 DIAGNOSIS — Z7951 Long term (current) use of inhaled steroids: Secondary | ICD-10-CM | POA: Diagnosis not present

## 2015-03-07 DIAGNOSIS — E785 Hyperlipidemia, unspecified: Secondary | ICD-10-CM | POA: Insufficient documentation

## 2015-03-07 DIAGNOSIS — Z7982 Long term (current) use of aspirin: Secondary | ICD-10-CM | POA: Insufficient documentation

## 2015-03-07 DIAGNOSIS — I10 Essential (primary) hypertension: Secondary | ICD-10-CM | POA: Diagnosis not present

## 2015-03-07 DIAGNOSIS — Z87891 Personal history of nicotine dependence: Secondary | ICD-10-CM | POA: Diagnosis not present

## 2015-03-07 DIAGNOSIS — R0602 Shortness of breath: Secondary | ICD-10-CM | POA: Diagnosis present

## 2015-03-07 LAB — COMPREHENSIVE METABOLIC PANEL
ALBUMIN: 4.4 g/dL (ref 3.5–5.0)
ALK PHOS: 82 U/L (ref 38–126)
ALT: 15 U/L — ABNORMAL LOW (ref 17–63)
AST: 21 U/L (ref 15–41)
Anion gap: 10 (ref 5–15)
BILIRUBIN TOTAL: 0.5 mg/dL (ref 0.3–1.2)
BUN: 5 mg/dL — ABNORMAL LOW (ref 6–20)
CALCIUM: 9.6 mg/dL (ref 8.9–10.3)
CO2: 28 mmol/L (ref 22–32)
Chloride: 96 mmol/L — ABNORMAL LOW (ref 101–111)
Creatinine, Ser: 0.78 mg/dL (ref 0.61–1.24)
Glucose, Bld: 109 mg/dL — ABNORMAL HIGH (ref 65–99)
POTASSIUM: 3.3 mmol/L — AB (ref 3.5–5.1)
Sodium: 134 mmol/L — ABNORMAL LOW (ref 135–145)
TOTAL PROTEIN: 7.3 g/dL (ref 6.5–8.1)

## 2015-03-07 LAB — CBC WITH DIFFERENTIAL/PLATELET
BASOS ABS: 0 10*3/uL (ref 0.0–0.1)
Basophils Relative: 1 %
EOS ABS: 0.8 10*3/uL — AB (ref 0.0–0.7)
EOS PCT: 13 %
HEMATOCRIT: 38.7 % — AB (ref 39.0–52.0)
HEMOGLOBIN: 13.1 g/dL (ref 13.0–17.0)
Lymphocytes Relative: 18 %
Lymphs Abs: 1.1 10*3/uL (ref 0.7–4.0)
MCH: 29.3 pg (ref 26.0–34.0)
MCHC: 33.9 g/dL (ref 30.0–36.0)
MCV: 86.6 fL (ref 78.0–100.0)
MONO ABS: 0.7 10*3/uL (ref 0.1–1.0)
Monocytes Relative: 12 %
Neutro Abs: 3.4 10*3/uL (ref 1.7–7.7)
Neutrophils Relative %: 56 %
Platelets: 231 10*3/uL (ref 150–400)
RBC: 4.47 MIL/uL (ref 4.22–5.81)
RDW: 13.1 % (ref 11.5–15.5)
WBC: 6.1 10*3/uL (ref 4.0–10.5)

## 2015-03-07 MED ORDER — METHYLPREDNISOLONE SODIUM SUCC 125 MG IJ SOLR
125.0000 mg | Freq: Once | INTRAMUSCULAR | Status: AC
  Administered 2015-03-07: 125 mg via INTRAVENOUS
  Filled 2015-03-07: qty 2

## 2015-03-07 NOTE — ED Notes (Signed)
Iv attempted X2 without success, another RN to try,

## 2015-03-07 NOTE — ED Notes (Signed)
I took my medication and went to take a bath. Got jittery and was shaking real bad. Started huffing and puffing and could not breathe well per pt. I have been having some shortness of breath this past week. If I exert myself I get short of breath.

## 2015-03-07 NOTE — ED Notes (Signed)
Per patient he took 2 nebulatizer treatments PTA, with little improvement.

## 2015-03-07 NOTE — ED Provider Notes (Signed)
CSN: [id number]     Arrival date & time 03/07/15  2214 History   First MD Initiated Contact with Patient 03/07/15 2226     Chief Complaint  Patient presents with  . Shortness of Breath     (Consider location/radiation/quality/duration/timing/severity/associated sxs/prior Treatment) Patient is a 64 y.o. male presenting with shortness of breath. The history is provided by the patient. No language interpreter was used.  Shortness of Breath Severity:  Moderate Onset quality:  Gradual Duration:  1 day Timing:  Constant Progression:  Improving Chronicity:  Recurrent Context: URI   Relieved by:  Nothing Worsened by:  Nothing tried Ineffective treatments:  Inhaler Associated symptoms: no abdominal pain   Risk factors: no recent alcohol use   Pt was a smoker for 35 years.  Pt has COPD.  Pt had increased shortness of breath tonight unrelieved by nebulization.   Pt did a second treatment.  Pt reports he began feeling better as far as his breathing but felt very shakey.  Pt reports he took a shower and felt shot of breath and shakey  Past Medical History  Diagnosis Date  . Fasting hyperglycemia     Normal hemoglobin A1c of 6.0  . Hypertension   . Tobacco abuse   . Chest pain 04/2011    Normal echocardiogram  . COPD (chronic obstructive pulmonary disease) (HCC)   . Hyperlipemia    Past Surgical History  Procedure Laterality Date  . Appendectomy    . Appendectomy    . Left heart catheterization with coronary angiogram N/A 05/17/2011    Procedure: LEFT HEART CATHETERIZATION WITH CORONARY ANGIOGRAM;  Surgeon: Tonny Bollman, MD;  Location: [facility] CATH LAB;  Service: Cardiovascular;  Laterality: N/A;   Family History  Problem Relation Age of Onset  . Emphysema Father    Social History  Substance Use Topics  . Smoking status: Former Smoker -- 1.00 packs/day for 35 years    Types: Cigarettes    Quit date: 05/09/2013  . Smokeless tobacco: Never Used  . Alcohol Use: No    Review of  Systems  Respiratory: Positive for shortness of breath.   Gastrointestinal: Negative for abdominal pain.  All other systems reviewed and are negative.     Allergies  Codeine  Home Medications   Prior to Admission medications   Medication Sig Start Date End Date Taking? Authorizing Provider  albuterol (PROVENTIL HFA;VENTOLIN HFA) 108 (90 BASE) MCG/ACT inhaler Inhale 2 puffs into the lungs every 6 (six) hours as needed for wheezing or shortness of breath.   Yes Historical Provider, MD  aspirin EC 81 MG tablet Take 81 mg by mouth daily.   Yes Historical Provider, MD  budesonide-formoterol (SYMBICORT) 160-4.5 MCG/ACT inhaler Inhale 2 puffs into the lungs 2 (two) times daily.   Yes Historical Provider, MD  chlorthalidone (HYGROTON) 25 MG tablet Take 25 mg by mouth daily.    Yes Historical Provider, MD  gabapentin (NEURONTIN) 300 MG capsule Take 300 mg by mouth 2 (two) times daily.  03/22/13  Yes Historical Provider, MD  ibuprofen (ADVIL,MOTRIN) 600 MG tablet Take 600 mg by mouth 3 (three) times daily as needed. 03/04/15  Yes Historical Provider, MD  ipratropium-albuterol (DUONEB) 0.5-2.5 (3) MG/3ML SOLN Inhale 3 mLs into the lungs every 6 (six) hours as needed (for shortness of breath/wheezing).  07/17/14  Yes Historical Provider, MD  isosorbide mononitrate (IMDUR) 30 MG 24 hr tablet Take 30 mg by mouth daily. 03/22/14  Yes Historical Provider, MD  lansoprazole (PREVACID) 30 MG capsule  Take 30 mg by mouth daily.  10/17/14  Yes Historical Provider, MD  metoprolol succinate (TOPROL-XL) 50 MG 24 hr tablet Take 50 mg by mouth every morning. Take with or immediately following a meal.   Yes Historical Provider, MD  multivitamin (ONE-A-DAY MEN'S) TABS Take 1 tablet by mouth daily.     Yes Historical Provider, MD  penicillin v potassium (VEETID) 500 MG tablet Take 500 mg by mouth 4 (four) times daily. 03/04/15  Yes Historical Provider, MD  polyethylene glycol (MIRALAX / GLYCOLAX) packet Take 17 g by mouth  daily as needed for mild constipation.   Yes Historical Provider, MD  potassium chloride SA (K-DUR,KLOR-CON) 20 MEQ tablet Take 20 mEq by mouth daily.  05/12/14  Yes Historical Provider, MD  ranitidine (ZANTAC) 300 MG tablet Take 300 mg by mouth at bedtime.   Yes Historical Provider, MD  simvastatin (ZOCOR) 20 MG tablet Take 20 mg by mouth at bedtime.  03/22/14  Yes Historical Provider, MD  tiotropium (SPIRIVA) 18 MCG inhalation capsule Place 18 mcg into inhaler and inhale every morning.   Yes Historical Provider, MD  doxycycline (VIBRA-TABS) 100 MG tablet Take 1 tablet by mouth 2 (two) times daily. Starting 11/10/2014 x 10 days. 11/10/14   Historical Provider, MD  pantoprazole (PROTONIX) 20 MG tablet Take 1 tablet (20 mg total) by mouth daily. Patient not taking: Reported on 08/11/2014 05/05/14   Bethann BerkshireJoseph Zammit, MD  predniSONE (DELTASONE) 20 MG tablet Take 3 tablets (60 mg total) by mouth daily. 02/22/15   Kristen N Ward, DO   BP 149/88 mmHg  Pulse 81  Temp(Src) 98.2 F (36.8 C) (Oral)  Resp 20  Ht 5\' 6"  (1.676 m)  Wt 125 lb (56.7 kg)  BMI 20.19 kg/m2  SpO2 100% Physical Exam  Constitutional: He is oriented to person, place, and time. He appears well-developed and well-nourished.  HENT:  Head: Normocephalic and atraumatic.  Nose: Nose normal.  Eyes: EOM are normal. Pupils are equal, round, and reactive to light.  Neck: Normal range of motion.  Cardiovascular: Normal rate, regular rhythm and normal heart sounds.   Pulmonary/Chest: Effort normal.  Lungs clear no wheezing  Abdominal: Soft. He exhibits no distension.  Musculoskeletal: Normal range of motion.  Neurological: He is alert and oriented to person, place, and time.  Skin: Skin is warm.  Psychiatric: He has a normal mood and affect.  Nursing note and vitals reviewed.   ED Course  Procedures (including critical care time) Labs Review Labs Reviewed  CBC WITH DIFFERENTIAL/PLATELET - Abnormal; Notable for the following:    HCT  38.7 (*)    Eosinophils Absolute 0.8 (*)    All other components within normal limits  COMPREHENSIVE METABOLIC PANEL - Abnormal; Notable for the following:    Sodium 134 (*)    Potassium 3.3 (*)    Chloride 96 (*)    Glucose, Bld 109 (*)    BUN 5 (*)    ALT 15 (*)    All other components within normal limits    Imaging Review Dg Chest 2 View  03/07/2015  CLINICAL DATA:  Per Pt "I took my medication and went to take a bath. Got jittery and was shaking real bad. Started huffing and puffing and could not breathe well per pt. I have been having some shortness of breath this past week. History of smoking, hypertension and COPD. EXAM: CHEST  2 VIEW COMPARISON:  02/22/2015 FINDINGS: Lungs are hyperexpanded. There are mildly increased interstitial markings in the  lung bases. No lung consolidation or edema. No mass or suspicious nodule. No pleural effusion or pneumothorax. The heart, mediastinum and hila are unremarkable. The bony thorax is intact. IMPRESSION: 1. No acute cardiopulmonary disease.  Stable changes of COPD. Electronically Signed   By: Amie Portland M.D.   On: 03/07/2015 23:49   I have personally reviewed and evaluated these images and lab results as part of my medical decision-making.   EKG Interpretation None      MDM  I suspect pt was shakey due to albuterol neb two times in a row.   Pt feels better and is breathing better.  Pt given solumedrol IV.  I will treat with prednisone x 5 days.  Pt is advised to see his MD for recheck.   Final diagnoses:  Chronic obstructive pulmonary disease with acute exacerbation (HCC)    Meds ordered this encounter  Medications  . polyethylene glycol (MIRALAX / GLYCOLAX) packet    Sig: Take 17 g by mouth daily as needed for mild constipation.  . penicillin v potassium (VEETID) 500 MG tablet    Sig: Take 500 mg by mouth 4 (four) times daily.    Refill:  0  . ibuprofen (ADVIL,MOTRIN) 600 MG tablet    Sig: Take 600 mg by mouth 3 (three) times  daily as needed.    Refill:  0  . methylPREDNISolone sodium succinate (SOLU-MEDROL) 125 mg/2 mL injection 125 mg    Sig:   . predniSONE (DELTASONE) 20 MG tablet    Sig: Take 3 tablets (60 mg total) by mouth daily.    Dispense:  15 tablet    Refill:  0    Order Specific Question:  Supervising Provider    Answer:  Eber Hong [[id number]]     Lonia Skinner [place], PA-C 03/08/15 1610  Raeford Razor, MD 03/08/15 1630

## 2015-03-08 MED ORDER — PREDNISONE 20 MG PO TABS
60.0000 mg | ORAL_TABLET | Freq: Every day | ORAL | Status: DC
Start: 2015-03-08 — End: 2015-03-21

## 2015-03-08 NOTE — Discharge Instructions (Signed)
Chronic Obstructive Pulmonary Disease Chronic obstructive pulmonary disease (COPD) is a common lung condition in which airflow from the lungs is limited. COPD is a general term that can be used to describe many different lung problems that limit airflow, including both chronic bronchitis and emphysema. If you have COPD, your lung function will probably never return to normal, but there are measures you can take to improve lung function and make yourself feel better. CAUSES   Smoking (common).  Exposure to secondhand smoke.  Genetic problems.  Chronic inflammatory lung diseases or recurrent infections. SYMPTOMS  Shortness of breath, especially with physical activity.  Deep, persistent (chronic) cough with a large amount of thick mucus.  Wheezing.  Rapid breaths (tachypnea).  Gray or bluish discoloration (cyanosis) of the skin, especially in your fingers, toes, or lips.  Fatigue.  Weight loss.  Frequent infections or episodes when breathing symptoms become much worse (exacerbations).  Chest tightness. DIAGNOSIS Your health care provider will take a medical history and perform a physical examination to diagnose COPD. Additional tests for COPD may include:  Lung (pulmonary) function tests.  Chest X-ray.  CT scan.  Blood tests. TREATMENT  Treatment for COPD may include:  Inhaler and nebulizer medicines. These help manage the symptoms of COPD and make your breathing more comfortable.  Supplemental oxygen. Supplemental oxygen is only helpful if you have a low oxygen level in your blood.  Exercise and physical activity. These are beneficial for nearly all people with COPD.  Lung surgery or transplant.  Nutrition therapy to gain weight, if you are underweight.  Pulmonary rehabilitation. This may involve working with a team of health care providers and specialists, such as respiratory, occupational, and physical therapists. HOME CARE INSTRUCTIONS  Take all medicines  (inhaled or pills) as directed by your health care provider.  Avoid over-the-counter medicines or cough syrups that dry up your airway (such as antihistamines) and slow down the elimination of secretions unless instructed otherwise by your health care provider.  If you are a smoker, the most important thing that you can do is stop smoking. Continuing to smoke will cause further lung damage and breathing trouble. Ask your health care provider for help with quitting smoking. He or she can direct you to community resources or hospitals that provide support.  Avoid exposure to irritants such as smoke, chemicals, and fumes that aggravate your breathing.  Use oxygen therapy and pulmonary rehabilitation if directed by your health care provider. If you require home oxygen therapy, ask your health care provider whether you should purchase a pulse oximeter to measure your oxygen level at home.  Avoid contact with individuals who have a contagious illness.  Avoid extreme temperature and humidity changes.  Eat healthy foods. Eating smaller, more frequent meals and resting before meals may help you maintain your strength.  Stay active, but balance activity with periods of rest. Exercise and physical activity will help you maintain your ability to do things you want to do.  Preventing infection and hospitalization is very important when you have COPD. Make sure to receive all the vaccines your health care provider recommends, especially the pneumococcal and influenza vaccines. Ask your health care provider whether you need a pneumonia vaccine.  Learn and use relaxation techniques to manage stress.  Learn and use controlled breathing techniques as directed by your health care provider. Controlled breathing techniques include:  Pursed lip breathing. Start by breathing in (inhaling) through your nose for 1 second. Then, purse your lips as if you were   going to whistle and breathe out (exhale) through the  pursed lips for 2 seconds.  Diaphragmatic breathing. Start by putting one hand on your abdomen just above your waist. Inhale slowly through your nose. The hand on your abdomen should move out. Then purse your lips and exhale slowly. You should be able to feel the hand on your abdomen moving in as you exhale.  Learn and use controlled coughing to clear mucus from your lungs. Controlled coughing is a series of short, progressive coughs. The steps of controlled coughing are: 1. Lean your head slightly forward. 2. Breathe in deeply using diaphragmatic breathing. 3. Try to hold your breath for 3 seconds. 4. Keep your mouth slightly open while coughing twice. 5. Spit any mucus out into a tissue. 6. Rest and repeat the steps once or twice as needed. SEEK MEDICAL CARE IF:  You are coughing up more mucus than usual.  There is a change in the color or thickness of your mucus.  Your breathing is more labored than usual.  Your breathing is faster than usual. SEEK IMMEDIATE MEDICAL CARE IF:  You have shortness of breath while you are resting.  You have shortness of breath that prevents you from:  Being able to talk.  Performing your usual physical activities.  You have chest pain lasting longer than 5 minutes.  Your skin color is more cyanotic than usual.  You measure low oxygen saturations for longer than 5 minutes with a pulse oximeter. MAKE SURE YOU:  Understand these instructions.  Will watch your condition.  Will get help right away if you are not doing well or get worse.   This information is not intended to replace advice given to you by your health care provider. Make sure you discuss any questions you have with your health care provider.   Document Released: 01/26/2005 Document Revised: 05/09/2014 Document Reviewed: 12/13/2012 Elsevier Interactive Patient Education 2016 [employer]  

## 2015-03-21 ENCOUNTER — Emergency Department (HOSPITAL_COMMUNITY): Payer: BLUE CROSS/BLUE SHIELD

## 2015-03-21 ENCOUNTER — Encounter (HOSPITAL_COMMUNITY): Payer: Self-pay | Admitting: *Deleted

## 2015-03-21 ENCOUNTER — Emergency Department (HOSPITAL_COMMUNITY)
Admission: EM | Admit: 2015-03-21 | Discharge: 2015-03-21 | Disposition: A | Discharge status: Home / Self Care | Payer: BLUE CROSS/BLUE SHIELD | Attending: Emergency Medicine | Admitting: Emergency Medicine

## 2015-03-21 DIAGNOSIS — E785 Hyperlipidemia, unspecified: Secondary | ICD-10-CM | POA: Insufficient documentation

## 2015-03-21 DIAGNOSIS — I10 Essential (primary) hypertension: Secondary | ICD-10-CM | POA: Insufficient documentation

## 2015-03-21 DIAGNOSIS — Z79899 Other long term (current) drug therapy: Secondary | ICD-10-CM | POA: Diagnosis not present

## 2015-03-21 DIAGNOSIS — Z792 Long term (current) use of antibiotics: Secondary | ICD-10-CM | POA: Diagnosis not present

## 2015-03-21 DIAGNOSIS — J441 Chronic obstructive pulmonary disease with (acute) exacerbation: Secondary | ICD-10-CM

## 2015-03-21 DIAGNOSIS — Z87891 Personal history of nicotine dependence: Secondary | ICD-10-CM | POA: Diagnosis not present

## 2015-03-21 DIAGNOSIS — Z9889 Other specified postprocedural states: Secondary | ICD-10-CM | POA: Diagnosis not present

## 2015-03-21 DIAGNOSIS — R0602 Shortness of breath: Secondary | ICD-10-CM | POA: Diagnosis present

## 2015-03-21 DIAGNOSIS — Z7982 Long term (current) use of aspirin: Secondary | ICD-10-CM | POA: Diagnosis not present

## 2015-03-21 LAB — CBC WITH DIFFERENTIAL/PLATELET
Basophils Absolute: 0 10*3/uL (ref 0.0–0.1)
Basophils Relative: 1 %
EOS ABS: 0.7 10*3/uL (ref 0.0–0.7)
Eosinophils Relative: 12 %
HEMATOCRIT: 35.5 % — AB (ref 39.0–52.0)
HEMOGLOBIN: 11.8 g/dL — AB (ref 13.0–17.0)
LYMPHS ABS: 1.6 10*3/uL (ref 0.7–4.0)
LYMPHS PCT: 26 %
MCH: 28.6 pg (ref 26.0–34.0)
MCHC: 33.2 g/dL (ref 30.0–36.0)
MCV: 86 fL (ref 78.0–100.0)
MONOS PCT: 10 %
Monocytes Absolute: 0.6 10*3/uL (ref 0.1–1.0)
NEUTROS PCT: 53 %
Neutro Abs: 3.4 10*3/uL (ref 1.7–7.7)
Platelets: 312 10*3/uL (ref 150–400)
RBC: 4.13 MIL/uL — ABNORMAL LOW (ref 4.22–5.81)
RDW: 13.3 % (ref 11.5–15.5)
WBC: 6.3 10*3/uL (ref 4.0–10.5)

## 2015-03-21 LAB — COMPREHENSIVE METABOLIC PANEL
ALBUMIN: 4.1 g/dL (ref 3.5–5.0)
ALT: 13 U/L — ABNORMAL LOW (ref 17–63)
ANION GAP: 9 (ref 5–15)
AST: 18 U/L (ref 15–41)
Alkaline Phosphatase: 66 U/L (ref 38–126)
BILIRUBIN TOTAL: 0.5 mg/dL (ref 0.3–1.2)
BUN: 6 mg/dL (ref 6–20)
CHLORIDE: 98 mmol/L — AB (ref 101–111)
CO2: 27 mmol/L (ref 22–32)
Calcium: 9 mg/dL (ref 8.9–10.3)
Creatinine, Ser: 0.73 mg/dL (ref 0.61–1.24)
GFR calc Af Amer: >60 mL/min (ref >60)
GFR calc non Af Amer: >60 mL/min (ref >60)
GLUCOSE: 105 mg/dL — AB (ref 65–99)
POTASSIUM: 3.4 mmol/L — AB (ref 3.5–5.1)
SODIUM: 134 mmol/L — AB (ref 135–145)
Total Protein: 7 g/dL (ref 6.5–8.1)

## 2015-03-21 LAB — I-STAT TROPONIN, ED: Troponin i, poc: 0 ng/mL (ref 0.00–0.08)

## 2015-03-21 LAB — BRAIN NATRIURETIC PEPTIDE: B NATRIURETIC PEPTIDE 5: 22 pg/mL (ref 0.0–100.0)

## 2015-03-21 MED ORDER — LORAZEPAM 0.5 MG PO TABS: 1.0000 mg | ORAL_TABLET | ORAL | Status: DC | PRN

## 2015-03-21 MED ORDER — LORAZEPAM 2 MG/ML IJ SOLN
1.0000 mg | Freq: Once | INTRAMUSCULAR | Status: AC
  Administered 2015-03-21: 1 mg via INTRAVENOUS

## 2015-03-21 MED ORDER — PREDNISONE 10 MG PO TABS
20.0000 mg | ORAL_TABLET | Freq: Every day | ORAL | Status: DC
Start: 2015-03-21 — End: 2015-04-07

## 2015-03-21 MED ORDER — LORAZEPAM 2 MG/ML IJ SOLN
INTRAMUSCULAR | Status: AC
  Filled 2015-03-21: qty 1

## 2015-03-21 MED ORDER — LORAZEPAM 2 MG/ML IJ SOLN: 1.0000 mg | Freq: Once | INTRAMUSCULAR | Status: DC

## 2015-03-21 MED ORDER — LEVALBUTEROL HCL 1.25 MG/0.5ML IN NEBU
1.2500 mg | INHALATION_SOLUTION | Freq: Once | RESPIRATORY_TRACT | Status: AC
  Administered 2015-03-21: 1.25 mg via RESPIRATORY_TRACT
  Filled 2015-03-21: qty 0.5

## 2015-03-21 MED ORDER — METHYLPREDNISOLONE SODIUM SUCC 125 MG IJ SOLR: 125.0000 mg | Freq: Once | INTRAMUSCULAR | Status: DC

## 2015-03-21 NOTE — ED Notes (Signed)
Pt brought in by [facility] due to pt being sob. Pt received 5 mg albuterol, 125 mg solu-medrol en route. Pt has used MDI without relief.

## 2015-03-21 NOTE — ED Provider Notes (Signed)
CSN: [id number]     Arrival date & time 03/21/15  1954 History   First MD Initiated Contact with Patient 03/21/15 1959     Chief Complaint  Patient presents with  . Shortness of Breath     (Consider location/radiation/quality/duration/timing/severity/associated sxs/prior Treatment) Patient is a 64 y.o. male presenting with shortness of breath. The history is provided by the patient (Patient complains of shortness of breath. Patient has COPD and states he's been short of breath for couple days).  Shortness of Breath Severity:  Moderate Onset quality:  Sudden Timing:  Intermittent Progression:  Waxing and waning Chronicity:  Recurrent Context: not activity   Relieved by:  Inhaler Worsened by:  Nothing tried Associated symptoms: no abdominal pain, no chest pain, no cough, no headaches and no rash     Past Medical History  Diagnosis Date  . Fasting hyperglycemia     Normal hemoglobin A1c of 6.0  . Hypertension   . Tobacco abuse   . Chest pain 04/2011    Normal echocardiogram  . COPD (chronic obstructive pulmonary disease) (HCC)   . Hyperlipemia    Past Surgical History  Procedure Laterality Date  . Appendectomy    . Appendectomy    . Left heart catheterization with coronary angiogram N/A 05/17/2011    Procedure: LEFT HEART CATHETERIZATION WITH CORONARY ANGIOGRAM;  Surgeon: Tonny Bollman, MD;  Location: [facility] CATH LAB;  Service: Cardiovascular;  Laterality: N/A;   Family History  Problem Relation Age of Onset  . Emphysema Father    Social History  Substance Use Topics  . Smoking status: Former Smoker -- 1.00 packs/day for 35 years    Types: Cigarettes    Quit date: 05/09/2013  . Smokeless tobacco: Never Used  . Alcohol Use: No    Review of Systems  Constitutional: Negative for appetite change and fatigue.  HENT: Negative for congestion, ear discharge and sinus pressure.   Eyes: Negative for discharge.  Respiratory: Positive for shortness of breath. Negative for  cough.   Cardiovascular: Negative for chest pain.  Gastrointestinal: Negative for abdominal pain and diarrhea.  Genitourinary: Negative for frequency and hematuria.  Musculoskeletal: Negative for back pain.  Skin: Negative for rash.  Neurological: Negative for seizures and headaches.  Psychiatric/Behavioral: Negative for hallucinations.      Allergies  Codeine  Home Medications   Prior to Admission medications   Medication Sig Start Date End Date Taking? Authorizing Provider  albuterol (PROVENTIL HFA;VENTOLIN HFA) 108 (90 BASE) MCG/ACT inhaler Inhale 2 puffs into the lungs every 6 (six) hours as needed for wheezing or shortness of breath.   Yes Historical Provider, MD  aspirin EC 81 MG tablet Take 81 mg by mouth daily.   Yes Historical Provider, MD  budesonide-formoterol (SYMBICORT) 160-4.5 MCG/ACT inhaler Inhale 2 puffs into the lungs 2 (two) times daily.   Yes Historical Provider, MD  chlorthalidone (HYGROTON) 25 MG tablet Take 25 mg by mouth daily.    Yes Historical Provider, MD  gabapentin (NEURONTIN) 300 MG capsule Take 300 mg by mouth 2 (two) times daily.  03/22/13  Yes Historical Provider, MD  ibuprofen (ADVIL,MOTRIN) 600 MG tablet Take 600 mg by mouth 3 (three) times daily as needed. 03/04/15  Yes Historical Provider, MD  ipratropium-albuterol (DUONEB) 0.5-2.5 (3) MG/3ML SOLN Inhale 3 mLs into the lungs every 6 (six) hours as needed (for shortness of breath/wheezing).  07/17/14  Yes Historical Provider, MD  isosorbide mononitrate (IMDUR) 30 MG 24 hr tablet Take 30 mg by mouth daily. 03/22/14  Yes Historical Provider, MD  lansoprazole (PREVACID) 30 MG capsule Take 30 mg by mouth daily.  10/17/14  Yes Historical Provider, MD  metoprolol succinate (TOPROL-XL) 50 MG 24 hr tablet Take 50 mg by mouth every morning. Take with or immediately following a meal.   Yes Historical Provider, MD  multivitamin (ONE-A-DAY MEN'S) TABS Take 1 tablet by mouth daily.     Yes Historical Provider, MD   penicillin v potassium (VEETID) 500 MG tablet Take 500 mg by mouth 4 (four) times daily. 03/04/15  Yes Historical Provider, MD  polyethylene glycol (MIRALAX / GLYCOLAX) packet Take 17 g by mouth daily as needed for mild constipation.   Yes Historical Provider, MD  potassium chloride SA (K-DUR,KLOR-CON) 20 MEQ tablet Take 20 mEq by mouth daily.  05/12/14  Yes Historical Provider, MD  ranitidine (ZANTAC) 300 MG tablet Take 300 mg by mouth at bedtime.   Yes Historical Provider, MD  simvastatin (ZOCOR) 20 MG tablet Take 20 mg by mouth at bedtime.  03/22/14  Yes Historical Provider, MD  tiotropium (SPIRIVA) 18 MCG inhalation capsule Place 18 mcg into inhaler and inhale every morning.   Yes Historical Provider, MD  doxycycline (VIBRA-TABS) 100 MG tablet Take 1 tablet by mouth 2 (two) times daily. Starting 11/10/2014 x 10 days. 11/10/14   Historical Provider, MD  HYDROcodone-acetaminophen (NORCO/VICODIN) 5-325 MG tablet Take 1 tablet by mouth every 4 (four) hours as needed. 03/13/15   Historical Provider, MD  LORazepam (ATIVAN) 0.5 MG tablet Take 2 tablets (1 mg total) by mouth every 4 (four) hours as needed for anxiety. 03/21/15   Bethann Berkshire, MD  pantoprazole (PROTONIX) 20 MG tablet Take 1 tablet (20 mg total) by mouth daily. Patient not taking: Reported on 08/11/2014 05/05/14   Bethann Berkshire, MD  predniSONE (DELTASONE) 10 MG tablet Take 2 tablets (20 mg total) by mouth daily. 03/21/15   Bethann Berkshire, MD   BP 142/99 mmHg  Pulse 104  Temp(Src) 98.1 F (36.7 C) (Oral)  Resp 22  Ht  (1.727 m)  Wt 125 lb (56.7 kg)  BMI 19.01 kg/m2  SpO2 99% Physical Exam  Constitutional: He is oriented to person, place, and time. He appears well-developed.  HENT:  Head: Normocephalic.  Eyes: Conjunctivae and EOM are normal. No scleral icterus.  Neck: Neck supple. No thyromegaly present.  Cardiovascular: Normal rate and regular rhythm.  Exam reveals no gallop and no friction rub.   No murmur  heard. Pulmonary/Chest: No stridor. He has wheezes. He has no rales. He exhibits no tenderness.  Abdominal: He exhibits no distension. There is no tenderness. There is no rebound.  Musculoskeletal: Normal range of motion. He exhibits no edema.  Lymphadenopathy:    He has no cervical adenopathy.  Neurological: He is oriented to person, place, and time. He exhibits normal muscle tone. Coordination normal.  Skin: No rash noted. No erythema.  Psychiatric: He has a normal mood and affect. His behavior is normal.    ED Course  Procedures (including critical care time) Labs Review Labs Reviewed  CBC WITH DIFFERENTIAL/PLATELET - Abnormal; Notable for the following:    RBC 4.13 (*)    Hemoglobin 11.8 (*)    HCT 35.5 (*)    All other components within normal limits  COMPREHENSIVE METABOLIC PANEL - Abnormal; Notable for the following:    Sodium 134 (*)    Potassium 3.4 (*)    Chloride 98 (*)    Glucose, Bld 105 (*)    ALT 13 (*)  All other components within normal limits  BRAIN NATRIURETIC PEPTIDE  I-STAT TROPOININ, ED    Imaging Review Dg Chest Portable 1 View  03/21/2015  CLINICAL DATA:  SOB Since tonight, Denies cough or chest pain. History of COPD, HTN, heart cath. EXAM: PORTABLE CHEST 1 VIEW COMPARISON:  03/07/2015 FINDINGS: Hyperinflation. Midline trachea. Normal heart size. No pleural effusion or pneumothorax. Clear lungs. IMPRESSION: Hyperinflation, suggesting COPD. No acute superimposed process. Electronically Signed   By: Jeronimo GreavesKyle  Talbot M.D.   On: 03/21/2015 20:34   I have personally reviewed and evaluated these images and lab results as part of my medical decision-making.   EKG Interpretation   Date/Time:  Saturday March 21 2015 20:03:11 EST Ventricular Rate:  102 PR Interval:  194 QRS Duration: 100 QT Interval:  319 QTC Calculation: 415 R Axis:   85 Text Interpretation:  Sinus tachycardia Biatrial enlargement Borderline  right axis deviation Borderline  repolarization abnormality Confirmed by  Tamsin Nader  MD, Cristle Jared [phone]([id number]) on 03/21/2015 8:07:56 PM      MDM   Final diagnoses:  COPD exacerbation (HCC)    Labs unremarkable chest x-ray shows COPD. Patient improved with neb treatments and Ativan. He is sent home with prednisone and Ativan and told to use his inhaler every 4-6 hours as needed and follow-up with his PCP    Bethann BerkshireJoseph Kambrea Carrasco, MD 03/21/15 2134

## 2015-03-21 NOTE — Discharge Instructions (Signed)
Use your inhaler every 4-6 hours as needed.  Follow up with your md next week

## 2015-03-21 NOTE — ED Notes (Signed)
EKG done and given to Dr. Zammit 

## 2015-04-07 ENCOUNTER — Emergency Department (HOSPITAL_COMMUNITY)
Admission: EM | Admit: 2015-04-07 | Discharge: 2015-04-07 | Disposition: A | Discharge status: Home / Self Care | Payer: BLUE CROSS/BLUE SHIELD | Attending: Emergency Medicine | Admitting: Emergency Medicine

## 2015-04-07 ENCOUNTER — Encounter (HOSPITAL_COMMUNITY): Payer: Self-pay

## 2015-04-07 ENCOUNTER — Emergency Department (HOSPITAL_COMMUNITY): Payer: BLUE CROSS/BLUE SHIELD

## 2015-04-07 DIAGNOSIS — I10 Essential (primary) hypertension: Secondary | ICD-10-CM | POA: Diagnosis not present

## 2015-04-07 DIAGNOSIS — Z79899 Other long term (current) drug therapy: Secondary | ICD-10-CM | POA: Insufficient documentation

## 2015-04-07 DIAGNOSIS — Z7982 Long term (current) use of aspirin: Secondary | ICD-10-CM | POA: Insufficient documentation

## 2015-04-07 DIAGNOSIS — Z9889 Other specified postprocedural states: Secondary | ICD-10-CM | POA: Diagnosis not present

## 2015-04-07 DIAGNOSIS — J441 Chronic obstructive pulmonary disease with (acute) exacerbation: Secondary | ICD-10-CM | POA: Diagnosis not present

## 2015-04-07 DIAGNOSIS — R062 Wheezing: Secondary | ICD-10-CM | POA: Diagnosis present

## 2015-04-07 DIAGNOSIS — E785 Hyperlipidemia, unspecified: Secondary | ICD-10-CM | POA: Diagnosis not present

## 2015-04-07 DIAGNOSIS — Z87891 Personal history of nicotine dependence: Secondary | ICD-10-CM | POA: Diagnosis not present

## 2015-04-07 LAB — BASIC METABOLIC PANEL
ANION GAP: 13 (ref 5–15)
BUN: 8 mg/dL (ref 6–20)
CHLORIDE: 92 mmol/L — AB (ref 101–111)
CO2: 28 mmol/L (ref 22–32)
Calcium: 9.8 mg/dL (ref 8.9–10.3)
Creatinine, Ser: 0.76 mg/dL (ref 0.61–1.24)
GFR calc Af Amer: >60 mL/min (ref >60)
GLUCOSE: 101 mg/dL — AB (ref 65–99)
POTASSIUM: 4.1 mmol/L (ref 3.5–5.1)
SODIUM: 133 mmol/L — AB (ref 135–145)

## 2015-04-07 LAB — CBC
HEMATOCRIT: 38.5 % — AB (ref 39.0–52.0)
HEMOGLOBIN: 13 g/dL (ref 13.0–17.0)
MCH: 29.5 pg (ref 26.0–34.0)
MCHC: 33.8 g/dL (ref 30.0–36.0)
MCV: 87.5 fL (ref 78.0–100.0)
Platelets: 306 10*3/uL (ref 150–400)
RBC: 4.4 MIL/uL (ref 4.22–5.81)
RDW: 14.4 % (ref 11.5–15.5)
WBC: 8.3 10*3/uL (ref 4.0–10.5)

## 2015-04-07 MED ORDER — DEXAMETHASONE SODIUM PHOSPHATE 4 MG/ML IJ SOLN
8.0000 mg | Freq: Once | INTRAMUSCULAR | Status: AC
  Administered 2015-04-07: 8 mg via INTRAMUSCULAR
  Filled 2015-04-07: qty 2

## 2015-04-07 MED ORDER — ALBUTEROL SULFATE (2.5 MG/3ML) 0.083% IN NEBU
5.0000 mg | INHALATION_SOLUTION | Freq: Once | RESPIRATORY_TRACT | Status: AC
  Administered 2015-04-07: 5 mg via RESPIRATORY_TRACT
  Filled 2015-04-07: qty 6

## 2015-04-07 MED ORDER — PREDNISONE 10 MG (21) PO TBPK: 10.0000 mg | ORAL_TABLET | Freq: Every day | ORAL | Status: DC

## 2015-04-07 NOTE — ED Provider Notes (Signed)
CSN: [id number]     Arrival date & time 04/07/15  1849 History   First MD Initiated Contact with Patient 04/07/15 1925     Chief Complaint  Patient presents with  . COPD     (Consider location/radiation/quality/duration/timing/severity/associated sxs/prior Treatment) HPI.... Patient with known COPD presents with mild dyspnea and wheezing for 2 days. His uses nebulizer treatments at home along with an inhaler with moderate results. He no longer smokes. No fever, sweats, chills, rusty sputum. Severity symptoms mild.  Past Medical History  Diagnosis Date  . Fasting hyperglycemia     Normal hemoglobin A1c of 6.0  . Hypertension   . Tobacco abuse   . Chest pain 04/2011    Normal echocardiogram  . COPD (chronic obstructive pulmonary disease) (HCC)   . Hyperlipemia    Past Surgical History  Procedure Laterality Date  . Appendectomy    . Appendectomy    . Left heart catheterization with coronary angiogram N/A 05/17/2011    Procedure: LEFT HEART CATHETERIZATION WITH CORONARY ANGIOGRAM;  Surgeon: Tonny BollmanMichael Cooper, MD;  [facility];  Service: Cardiovascular;  Laterality: N/A;   Family History  Problem Relation Age of Onset  . Emphysema Father    Social History  Substance Use Topics  . Smoking status: Former Smoker -- 1.00 packs/day for 35 years    Types: Cigarettes    Quit date: 05/09/2013  . Smokeless tobacco: Never Used  . Alcohol Use: No    Review of Systems  All other systems reviewed and are negative.     Allergies  Codeine  Home Medications   Prior to Admission medications   Medication Sig Start Date End Date Taking? Authorizing Provider  albuterol (PROVENTIL HFA;VENTOLIN HFA) 108 (90 BASE) MCG/ACT inhaler Inhale 2 puffs into the lungs every 6 (six) hours as needed for wheezing or shortness of breath.    Historical Provider, MD  aspirin EC 81 MG tablet Take 81 mg by mouth daily.    Historical Provider, MD  budesonide-formoterol (SYMBICORT) 160-4.5 MCG/ACT  inhaler Inhale 2 puffs into the lungs 2 (two) times daily.    Historical Provider, MD  chlorthalidone (HYGROTON) 25 MG tablet Take 25 mg by mouth daily.     Historical Provider, MD  doxycycline (VIBRA-TABS) 100 MG tablet Take 1 tablet by mouth 2 (two) times daily. Starting 11/10/2014 x 10 days. 11/10/14   Historical Provider, MD  gabapentin (NEURONTIN) 300 MG capsule Take 300 mg by mouth 2 (two) times daily.  03/22/13   Historical Provider, MD  HYDROcodone-acetaminophen (NORCO/VICODIN) 5-325 MG tablet Take 1 tablet by mouth every 4 (four) hours as needed. 03/13/15   Historical Provider, MD  ibuprofen (ADVIL,MOTRIN) 600 MG tablet Take 600 mg by mouth 3 (three) times daily as needed. 03/04/15   Historical Provider, MD  ipratropium-albuterol (DUONEB) 0.5-2.5 (3) MG/3ML SOLN Inhale 3 mLs into the lungs every 6 (six) hours as needed (for shortness of breath/wheezing).  07/17/14   Historical Provider, MD  isosorbide mononitrate (IMDUR) 30 MG 24 hr tablet Take 30 mg by mouth daily. 03/22/14   Historical Provider, MD  lansoprazole (PREVACID) 30 MG capsule Take 30 mg by mouth daily.  10/17/14   Historical Provider, MD  LORazepam (ATIVAN) 0.5 MG tablet Take 2 tablets (1 mg total) by mouth every 4 (four) hours as needed for anxiety. 03/21/15   Bethann BerkshireJoseph Zammit, MD  metoprolol succinate (TOPROL-XL) 50 MG 24 hr tablet Take 50 mg by mouth every morning. Take with or immediately following a meal.  Historical Provider, MD  multivitamin (ONE-A-DAY MEN'S) TABS Take 1 tablet by mouth daily.      Historical Provider, MD  pantoprazole (PROTONIX) 20 MG tablet Take 1 tablet (20 mg total) by mouth daily. Patient not taking: Reported on 08/11/2014 05/05/14   Bethann Berkshire, MD  penicillin v potassium (VEETID) 500 MG tablet Take 500 mg by mouth 4 (four) times daily. 03/04/15   Historical Provider, MD  polyethylene glycol (MIRALAX / GLYCOLAX) packet Take 17 g by mouth daily as needed for mild constipation.    Historical Provider, MD   potassium chloride SA (K-DUR,KLOR-CON) 20 MEQ tablet Take 20 mEq by mouth daily.  05/12/14   Historical Provider, MD  predniSONE (STERAPRED UNI-PAK 21 TAB) 10 MG (21) TBPK tablet Take 1 tablet (10 mg total) by mouth daily. Take 6 tabs by mouth daily  for 2 days, then 5 tabs for 2 days, then 4 tabs for 2 days, then 3 tabs for 2 days, 2 tabs for 2 days, then 1 tab by mouth daily for 2 days 04/07/15   Donnetta Hutching, MD  ranitidine (ZANTAC) 300 MG tablet Take 300 mg by mouth at bedtime.    Historical Provider, MD  simvastatin (ZOCOR) 20 MG tablet Take 20 mg by mouth at bedtime.  03/22/14   Historical Provider, MD  tiotropium (SPIRIVA) 18 MCG inhalation capsule Place 18 mcg into inhaler and inhale every morning.    Historical Provider, MD   BP 113/80 mmHg  Pulse 76  Temp(Src) 98.2 F (36.8 C) (Oral)  Resp 17  Ht  (1.676 m)  Wt 125 lb (56.7 kg)  BMI 20.19 kg/m2  SpO2 98% Physical Exam  Constitutional: He is oriented to person, place, and time. He appears well-developed and well-nourished.  Nontoxic-appearing, no obvious respiratory distress  HENT:  Head: Normocephalic and atraumatic.  Eyes: Conjunctivae and EOM are normal. Pupils are equal, round, and reactive to light.  Neck: Normal range of motion. Neck supple.  Cardiovascular: Normal rate and regular rhythm.   Pulmonary/Chest: Effort normal.  Minimal expiratory wheeze bilaterally  Abdominal: Soft. Bowel sounds are normal.  Musculoskeletal: Normal range of motion.  Neurological: He is alert and oriented to person, place, and time.  Skin: Skin is warm and dry.  Psychiatric: He has a normal mood and affect. His behavior is normal.  Nursing note and vitals reviewed.   ED Course  Procedures (including critical care time) Labs Review Labs Reviewed  BASIC METABOLIC PANEL - Abnormal; Notable for the following:    Sodium 133 (*)    Chloride 92 (*)    Glucose, Bld 101 (*)    All other components within normal limits  CBC - Abnormal;  Notable for the following:    HCT 38.5 (*)    All other components within normal limits    Imaging Review Dg Chest Portable 1 View  04/07/2015  CLINICAL DATA:  Chronic obstructive pulmonary disease.  Chest pain. EXAM: PORTABLE CHEST 1 VIEW COMPARISON:  March 21, 2015. FINDINGS: The heart size and mediastinal contours are within normal limits. Both lungs are clear. Hyperexpansion of the lungs is noted. No pneumothorax or pleural effusion is noted. The visualized skeletal structures are unremarkable. IMPRESSION: Findings consistent with chronic obstructive pulmonary disease. No acute cardiopulmonary abnormality seen. Electronically Signed   By: Lupita Raider, M.D.   On: 04/07/2015 19:54   I have personally reviewed and evaluated these images and lab results as part of my medical decision-making.   EKG Interpretation  Date/Time:  Tuesday April 07 2015 19:37:20 EST Ventricular Rate:  76 PR Interval:  170 QRS Duration: 119 QT Interval:  413 QTC Calculation: 464 R Axis:   76 Text Interpretation:  Sinus rhythm Ventricular premature complex LAE,  consider biatrial enlargement Nonspecific intraventricular conduction  delay Borderline ST elevation, anterior leads Artifact in lead(s) II aVR  aVF V1 V2 V3 V4 V5 V6 Confirmed by Larene Ascencio  MD, Espen Bethel ([id number]) on 04/07/2015  7:41:10 PM      MDM   Final diagnoses:  COPD exacerbation (HCC)    Patient feels much better after nebulizer treatment.  Chest x-ray shows COPD but no pneumonia. IM Decadron. Will send home with oral prednisone.    Donnetta Hutching, MD 04/09/15 1355

## 2015-04-07 NOTE — ED Notes (Signed)
MD at bedside. 

## 2015-04-07 NOTE — ED Notes (Signed)
My COPD has been bothering me for the past couple of days. I have been using my inhaler and nebulizer treatments at home. Fell like I am huffing and puffing.

## 2015-04-07 NOTE — Discharge Instructions (Signed)
Chest xray ok.   Continue your breathing treatments.   Prescription for prednisone

## 2015-05-20 ENCOUNTER — Encounter (HOSPITAL_COMMUNITY): Payer: Self-pay | Admitting: *Deleted

## 2015-05-20 ENCOUNTER — Emergency Department (HOSPITAL_COMMUNITY)
Admission: EM | Admit: 2015-05-20 | Discharge: 2015-05-21 | Disposition: A | Discharge status: Home / Self Care | Payer: BLUE CROSS/BLUE SHIELD | Attending: Emergency Medicine | Admitting: Emergency Medicine

## 2015-05-20 DIAGNOSIS — Z792 Long term (current) use of antibiotics: Secondary | ICD-10-CM | POA: Insufficient documentation

## 2015-05-20 DIAGNOSIS — R079 Chest pain, unspecified: Secondary | ICD-10-CM | POA: Diagnosis present

## 2015-05-20 DIAGNOSIS — K08409 Partial loss of teeth, unspecified cause, unspecified class: Secondary | ICD-10-CM | POA: Diagnosis not present

## 2015-05-20 DIAGNOSIS — E785 Hyperlipidemia, unspecified: Secondary | ICD-10-CM | POA: Diagnosis not present

## 2015-05-20 DIAGNOSIS — Z7951 Long term (current) use of inhaled steroids: Secondary | ICD-10-CM | POA: Diagnosis not present

## 2015-05-20 DIAGNOSIS — J441 Chronic obstructive pulmonary disease with (acute) exacerbation: Secondary | ICD-10-CM | POA: Diagnosis not present

## 2015-05-20 DIAGNOSIS — I10 Essential (primary) hypertension: Secondary | ICD-10-CM | POA: Insufficient documentation

## 2015-05-20 DIAGNOSIS — Z7982 Long term (current) use of aspirin: Secondary | ICD-10-CM | POA: Diagnosis not present

## 2015-05-20 DIAGNOSIS — K002 Abnormalities of size and form of teeth: Secondary | ICD-10-CM | POA: Diagnosis not present

## 2015-05-20 DIAGNOSIS — Z9889 Other specified postprocedural states: Secondary | ICD-10-CM | POA: Diagnosis not present

## 2015-05-20 DIAGNOSIS — Z87891 Personal history of nicotine dependence: Secondary | ICD-10-CM | POA: Insufficient documentation

## 2015-05-20 DIAGNOSIS — Z79899 Other long term (current) drug therapy: Secondary | ICD-10-CM | POA: Diagnosis not present

## 2015-05-20 NOTE — ED Notes (Signed)
Pt c/o chest pain x 2-3 days

## 2015-05-20 NOTE — ED Provider Notes (Signed)
CSN: [id number]     Arrival date & time 05/20/15  2003 History  By signing my name below, I, Arianna Nassar, attest that this documentation has been prepared under the direction and in the presence of Shon Baton, MD. Electronically Signed: Octavia Heir, ED Scribe. 05/21/2015. 12:29 AM.    Chief Complaint  Patient presents with  . Chest Pain     The history is provided by the patient. No language interpreter was used.   HPI Comments: Jeffery [name] is a 65 y.o. male who has a hx of COPD, HTN, and hyperlipidemia presents to the Emergency Department complaining of intermittent, gradual worsening, sharp, chest pain with associated shortness of breath. He reports each episode last about 3 minutes. He states he can be sitting down and the pain with suddenly hit him. Denies pain at this time. Pt has not taken any medication to alleviate is pain. He reports not having this pain in the past. He reports cough.  He denies  fever, leg swelling and recent travel.   Past Medical History  Diagnosis Date  . Fasting hyperglycemia     Normal hemoglobin A1c of 6.0  . Hypertension   . Tobacco abuse   . Chest pain 04/2011    Normal echocardiogram  . COPD (chronic obstructive pulmonary disease) (HCC)   . Hyperlipemia    Past Surgical History  Procedure Laterality Date  . Appendectomy    . Appendectomy    . Left heart catheterization with coronary angiogram N/A 05/17/2011    Procedure: LEFT HEART CATHETERIZATION WITH CORONARY ANGIOGRAM;  Surgeon: Tonny Bollman, MD;  Location: [facility] CATH LAB;  Service: Cardiovascular;  Laterality: N/A;   Family History  Problem Relation Age of Onset  . Emphysema Father    Social History  Substance Use Topics  . Smoking status: Former Smoker -- 1.00 packs/day for 35 years    Types: Cigarettes    Quit date: 05/09/2013  . Smokeless tobacco: Never Used  . Alcohol Use: No    Review of Systems  Constitutional: Negative for fever.  Respiratory: Positive for  cough and shortness of breath.   Cardiovascular: Positive for chest pain. Negative for leg swelling.  All other systems reviewed and are negative.     Allergies  Codeine  Home Medications   Prior to Admission medications   Medication Sig Start Date End Date Taking? Authorizing Provider  albuterol (PROVENTIL HFA;VENTOLIN HFA) 108 (90 BASE) MCG/ACT inhaler Inhale 2 puffs into the lungs every 6 (six) hours as needed for wheezing or shortness of breath.    Historical Provider, MD  aspirin EC 81 MG tablet Take 81 mg by mouth daily.    Historical Provider, MD  budesonide-formoterol (SYMBICORT) 160-4.5 MCG/ACT inhaler Inhale 2 puffs into the lungs 2 (two) times daily.    Historical Provider, MD  chlorthalidone (HYGROTON) 25 MG tablet Take 25 mg by mouth daily.     Historical Provider, MD  doxycycline (VIBRAMYCIN) 100 MG capsule Take 1 capsule (100 mg total) by mouth 2 (two) times daily. 05/21/15   Shon Baton, MD  gabapentin (NEURONTIN) 300 MG capsule Take 300 mg by mouth 2 (two) times daily.  03/22/13   Historical Provider, MD  HYDROcodone-acetaminophen (NORCO/VICODIN) 5-325 MG tablet Take 1 tablet by mouth every 4 (four) hours as needed. 03/13/15   Historical Provider, MD  ibuprofen (ADVIL,MOTRIN) 600 MG tablet Take 600 mg by mouth 3 (three) times daily as needed. 03/04/15   Historical Provider, MD  ipratropium-albuterol (DUONEB) 0.5-2.5 (  3) MG/3ML SOLN Inhale 3 mLs into the lungs every 6 (six) hours as needed (for shortness of breath/wheezing).  07/17/14   Historical Provider, MD  isosorbide mononitrate (IMDUR) 30 MG 24 hr tablet Take 30 mg by mouth daily. 03/22/14   Historical Provider, MD  lansoprazole (PREVACID) 30 MG capsule Take 30 mg by mouth daily.  10/17/14   Historical Provider, MD  LORazepam (ATIVAN) 0.5 MG tablet Take 2 tablets (1 mg total) by mouth every 4 (four) hours as needed for anxiety. 03/21/15   Bethann Berkshire, MD  metoprolol succinate (TOPROL-XL) 50 MG 24 hr tablet Take 50  mg by mouth every morning. Take with or immediately following a meal.    Historical Provider, MD  multivitamin (ONE-A-DAY MEN'S) TABS Take 1 tablet by mouth daily.      Historical Provider, MD  pantoprazole (PROTONIX) 20 MG tablet Take 1 tablet (20 mg total) by mouth daily. Patient not taking: Reported on 08/11/2014 05/05/14   Bethann Berkshire, MD  penicillin v potassium (VEETID) 500 MG tablet Take 500 mg by mouth 4 (four) times daily. 03/04/15   Historical Provider, MD  polyethylene glycol (MIRALAX / GLYCOLAX) packet Take 17 g by mouth daily as needed for mild constipation.    Historical Provider, MD  potassium chloride SA (K-DUR,KLOR-CON) 20 MEQ tablet Take 20 mEq by mouth daily.  05/12/14   Historical Provider, MD  predniSONE (DELTASONE) 20 MG tablet Take 3 tablets (60 mg total) by mouth once. 05/21/15   Shon Baton, MD  ranitidine (ZANTAC) 300 MG tablet Take 300 mg by mouth at bedtime.    Historical Provider, MD  simvastatin (ZOCOR) 20 MG tablet Take 20 mg by mouth at bedtime.  03/22/14   Historical Provider, MD  tiotropium (SPIRIVA) 18 MCG inhalation capsule Place 18 mcg into inhaler and inhale every morning.    Historical Provider, MD   Triage vitals: BP 131/86 mmHg  Pulse 75  Temp(Src) 97.8 F (36.6 C) (Oral)  Resp 20  Ht  (1.676 m)  Wt 125 lb (56.7 kg)  BMI 20.19 kg/m2  SpO2 99% Physical Exam  Constitutional: He is oriented to person, place, and time. He appears well-developed and well-nourished.  HENT:  Head: Normocephalic and atraumatic.  Poor dentition with multiple missing teeth  Eyes: Pupils are equal, round, and reactive to light.  Cardiovascular: Normal rate, regular rhythm and normal heart sounds.   No murmur heard. Pulmonary/Chest: Effort normal. No respiratory distress. He has wheezes.  End expiratory wheezing in all lung fields,  Abdominal: Soft. Bowel sounds are normal. There is no tenderness. There is no rebound.  Musculoskeletal: He exhibits no edema.   Neurological: He is alert and oriented to person, place, and time.  Skin: Skin is warm and dry.  Psychiatric: He has a normal mood and affect.  Nursing note and vitals reviewed.   ED Course  Procedures  DIAGNOSTIC STUDIES: Oxygen Saturation is 99% on RA, normal by my interpretation.  COORDINATION OF CARE:  12:28 AM Discussed treatment plan with pt at bedside and pt agreed to plan.  Labs Review Labs Reviewed  CBC WITH DIFFERENTIAL/PLATELET - Abnormal; Notable for the following:    Hemoglobin 12.9 (*)    Monocytes Absolute 1.2 (*)    All other components within normal limits  BASIC METABOLIC PANEL - Abnormal; Notable for the following:    Chloride 97 (*)    Glucose, Bld 127 (*)    BUN 5 (*)    All other components within  normal limits  TROPONIN I    Imaging Review Dg Chest 2 View  05/21/2015  CLINICAL DATA:  Intermittent gradually worsening LEFT chest pain for 3-4 days, shortness of breath. History of hypertension, COPD. Former smoker. EXAM: CHEST  2 VIEW COMPARISON:  Chest radiograph April 07, 2015 FINDINGS: Cardiomediastinal silhouette is normal. Apical bullous changes with increased lung volumes consistent with COPD. No pleural effusion or focal consolidation. No pneumothorax. Soft tissue planes and included osseous structure nonsuspicious. IMPRESSION: COPD, no superimposed acute cardiopulmonary process. Electronically Signed   By: Awilda Metro M.D.   On: 05/21/2015 00:47   I have personally reviewed and evaluated these images and lab results as part of my medical decision-making.   EKG Interpretation   Date/Time:  Wednesday May 20 2015 20:23:25 EST Ventricular Rate:  81 PR Interval:  154 QRS Duration: 88 QT Interval:  362 QTC Calculation: 420 R Axis:   73 Text Interpretation:  Normal sinus rhythm Possible Left atrial enlargement  T wave abnormality, consider inferior ischemia Abnormal ECG Artifact No  significant change since last tracing Reconfirmed by  HORTON  MD, COURTNEY  ([id number]) on 05/21/2015 5:09:14 AM      MDM   Final diagnoses:  COPD exacerbation (HCC)    Patient presents with several days of intermittent chest pain. Sporadic and lasting minutes. Nonexertional. EKG is reassuring without evidence of acute ischemia and initial troponin is negative. He has expiratory wheezing and cough. Suspect he may have an element of a COPD exacerbation. Patient was given a duo neb and prednisone. Chest x-ray shows no evidence of pneumonia. On recheck, patient continues to be pain-free. He reports improvement after DuoNeb. Will discharge home with primary physician follow-up. Patient given prednisone and an inhaler.  After history, exam, and medical workup I feel the patient has been appropriately medically screened and is safe for discharge home. Pertinent diagnoses were discussed with the patient. Patient was given return precautions.  I personally performed the services described in this documentation, which was scribed in my presence. The recorded information has been reviewed and is accurate.   Shon Baton, MD 05/21/15 [phone]

## 2015-05-21 ENCOUNTER — Emergency Department (HOSPITAL_COMMUNITY): Payer: BLUE CROSS/BLUE SHIELD

## 2015-05-21 LAB — CBC WITH DIFFERENTIAL/PLATELET
BASOS ABS: 0 10*3/uL (ref 0.0–0.1)
Basophils Relative: 0 %
Eosinophils Absolute: 0.6 10*3/uL (ref 0.0–0.7)
Eosinophils Relative: 8 %
HEMATOCRIT: 39.1 % (ref 39.0–52.0)
HEMOGLOBIN: 12.9 g/dL — AB (ref 13.0–17.0)
LYMPHS PCT: 12 %
Lymphs Abs: 0.9 10*3/uL (ref 0.7–4.0)
MCH: 29.4 pg (ref 26.0–34.0)
MCHC: 33 g/dL (ref 30.0–36.0)
MCV: 89.1 fL (ref 78.0–100.0)
Monocytes Absolute: 1.2 10*3/uL — ABNORMAL HIGH (ref 0.1–1.0)
Monocytes Relative: 15 %
NEUTROS ABS: 5.2 10*3/uL (ref 1.7–7.7)
Neutrophils Relative %: 65 %
Platelets: 271 10*3/uL (ref 150–400)
RBC: 4.39 MIL/uL (ref 4.22–5.81)
RDW: 14.3 % (ref 11.5–15.5)
WBC: 7.9 10*3/uL (ref 4.0–10.5)

## 2015-05-21 LAB — BASIC METABOLIC PANEL
ANION GAP: 10 (ref 5–15)
BUN: 5 mg/dL — ABNORMAL LOW (ref 6–20)
CALCIUM: 9.5 mg/dL (ref 8.9–10.3)
CO2: 31 mmol/L (ref 22–32)
Chloride: 97 mmol/L — ABNORMAL LOW (ref 101–111)
Creatinine, Ser: 0.81 mg/dL (ref 0.61–1.24)
Glucose, Bld: 127 mg/dL — ABNORMAL HIGH (ref 65–99)
Potassium: 3.5 mmol/L (ref 3.5–5.1)
SODIUM: 138 mmol/L (ref 135–145)

## 2015-05-21 LAB — TROPONIN I

## 2015-05-21 MED ORDER — DOXYCYCLINE HYCLATE 100 MG PO CAPS: 100.0000 mg | ORAL_CAPSULE | Freq: Two times a day (BID) | ORAL | Status: DC

## 2015-05-21 MED ORDER — PREDNISONE 20 MG PO TABS: 60.0000 mg | ORAL_TABLET | Freq: Once | ORAL | Status: DC

## 2015-05-21 MED ORDER — PREDNISONE 50 MG PO TABS
60.0000 mg | ORAL_TABLET | Freq: Once | ORAL | Status: AC
  Administered 2015-05-21: 60 mg via ORAL

## 2015-05-21 MED ORDER — PREDNISONE 20 MG PO TABS
ORAL_TABLET | ORAL | Status: AC
  Filled 2015-05-21: qty 3

## 2015-05-21 MED ORDER — IPRATROPIUM-ALBUTEROL 0.5-2.5 (3) MG/3ML IN SOLN
3.0000 mL | Freq: Once | RESPIRATORY_TRACT | Status: AC
  Administered 2015-05-21: 3 mL via RESPIRATORY_TRACT
  Filled 2015-05-21: qty 3

## 2015-05-21 NOTE — Discharge Instructions (Signed)
Chronic Obstructive Pulmonary Disease Chronic obstructive pulmonary disease (COPD) is a common lung condition in which airflow from the lungs is limited. COPD is a general term that can be used to describe many different lung problems that limit airflow, including both chronic bronchitis and emphysema. If you have COPD, your lung function will probably never return to normal, but there are measures you can take to improve lung function and make yourself feel better. CAUSES   Smoking (common).  Exposure to secondhand smoke.  Genetic problems.  Chronic inflammatory lung diseases or recurrent infections. SYMPTOMS  Shortness of breath, especially with physical activity.  Deep, persistent (chronic) cough with a large amount of thick mucus.  Wheezing.  Rapid breaths (tachypnea).  Gray or bluish discoloration (cyanosis) of the skin, especially in your fingers, toes, or lips.  Fatigue.  Weight loss.  Frequent infections or episodes when breathing symptoms become much worse (exacerbations).  Chest tightness. DIAGNOSIS Your health care provider will take a medical history and perform a physical examination to diagnose COPD. Additional tests for COPD may include:  Lung (pulmonary) function tests.  Chest X-ray.  CT scan.  Blood tests. TREATMENT  Treatment for COPD may include:  Inhaler and nebulizer medicines. These help manage the symptoms of COPD and make your breathing more comfortable.  Supplemental oxygen. Supplemental oxygen is only helpful if you have a low oxygen level in your blood.  Exercise and physical activity. These are beneficial for nearly all people with COPD.  Lung surgery or transplant.  Nutrition therapy to gain weight, if you are underweight.  Pulmonary rehabilitation. This may involve working with a team of health care providers and specialists, such as respiratory, occupational, and physical therapists. HOME CARE INSTRUCTIONS  Take all medicines  (inhaled or pills) as directed by your health care provider.  Avoid over-the-counter medicines or cough syrups that dry up your airway (such as antihistamines) and slow down the elimination of secretions unless instructed otherwise by your health care provider.  If you are a smoker, the most important thing that you can do is stop smoking. Continuing to smoke will cause further lung damage and breathing trouble. Ask your health care provider for help with quitting smoking. He or she can direct you to community resources or hospitals that provide support.  Avoid exposure to irritants such as smoke, chemicals, and fumes that aggravate your breathing.  Use oxygen therapy and pulmonary rehabilitation if directed by your health care provider. If you require home oxygen therapy, ask your health care provider whether you should purchase a pulse oximeter to measure your oxygen level at home.  Avoid contact with individuals who have a contagious illness.  Avoid extreme temperature and humidity changes.  Eat healthy foods. Eating smaller, more frequent meals and resting before meals may help you maintain your strength.  Stay active, but balance activity with periods of rest. Exercise and physical activity will help you maintain your ability to do things you want to do.  Preventing infection and hospitalization is very important when you have COPD. Make sure to receive all the vaccines your health care provider recommends, especially the pneumococcal and influenza vaccines. Ask your health care provider whether you need a pneumonia vaccine.  Learn and use relaxation techniques to manage stress.  Learn and use controlled breathing techniques as directed by your health care provider. Controlled breathing techniques include:  Pursed lip breathing. Start by breathing in (inhaling) through your nose for 1 second. Then, purse your lips as if you were   going to whistle and breathe out (exhale) through the  pursed lips for 2 seconds.  Diaphragmatic breathing. Start by putting one hand on your abdomen just above your waist. Inhale slowly through your nose. The hand on your abdomen should move out. Then purse your lips and exhale slowly. You should be able to feel the hand on your abdomen moving in as you exhale.  Learn and use controlled coughing to clear mucus from your lungs. Controlled coughing is a series of short, progressive coughs. The steps of controlled coughing are: 1. Lean your head slightly forward. 2. Breathe in deeply using diaphragmatic breathing. 3. Try to hold your breath for 3 seconds. 4. Keep your mouth slightly open while coughing twice. 5. Spit any mucus out into a tissue. 6. Rest and repeat the steps once or twice as needed. SEEK MEDICAL CARE IF:  You are coughing up more mucus than usual.  There is a change in the color or thickness of your mucus.  Your breathing is more labored than usual.  Your breathing is faster than usual. SEEK IMMEDIATE MEDICAL CARE IF:  You have shortness of breath while you are resting.  You have shortness of breath that prevents you from:  Being able to talk.  Performing your usual physical activities.  You have chest pain lasting longer than 5 minutes.  Your skin color is more cyanotic than usual.  You measure low oxygen saturations for longer than 5 minutes with a pulse oximeter. MAKE SURE YOU:  Understand these instructions.  Will watch your condition.  Will get help right away if you are not doing well or get worse.   This information is not intended to replace advice given to you by your health care provider. Make sure you discuss any questions you have with your health care provider.   Document Released: 01/26/2005 Document Revised: 05/09/2014 Document Reviewed: 12/13/2012 Elsevier Interactive Patient Education 2016 [employer]  

## 2015-05-21 NOTE — ED Notes (Signed)
Patient give prednisone  x3 po

## 2015-06-02 ENCOUNTER — Emergency Department (HOSPITAL_COMMUNITY): Payer: BLUE CROSS/BLUE SHIELD

## 2015-06-02 ENCOUNTER — Encounter (HOSPITAL_COMMUNITY): Payer: Self-pay | Admitting: Emergency Medicine

## 2015-06-02 ENCOUNTER — Emergency Department (HOSPITAL_COMMUNITY)
Admission: EM | Admit: 2015-06-02 | Discharge: 2015-06-02 | Disposition: A | Discharge status: Home / Self Care | Payer: BLUE CROSS/BLUE SHIELD | Source: Home / Self Care | Attending: Emergency Medicine | Admitting: Emergency Medicine

## 2015-06-02 ENCOUNTER — Inpatient Hospital Stay (HOSPITAL_COMMUNITY)
Admission: EM | Admit: 2015-06-02 | Discharge: 2015-06-04 | DRG: 192 | Disposition: A | Discharge status: Home / Self Care | Payer: BLUE CROSS/BLUE SHIELD | Attending: Internal Medicine | Admitting: Internal Medicine

## 2015-06-02 ENCOUNTER — Encounter (HOSPITAL_COMMUNITY): Payer: Self-pay | Admitting: *Deleted

## 2015-06-02 DIAGNOSIS — Z825 Family history of asthma and other chronic lower respiratory diseases: Secondary | ICD-10-CM

## 2015-06-02 DIAGNOSIS — I10 Essential (primary) hypertension: Secondary | ICD-10-CM

## 2015-06-02 DIAGNOSIS — Z7982 Long term (current) use of aspirin: Secondary | ICD-10-CM | POA: Insufficient documentation

## 2015-06-02 DIAGNOSIS — R739 Hyperglycemia, unspecified: Secondary | ICD-10-CM | POA: Diagnosis present

## 2015-06-02 DIAGNOSIS — R7303 Prediabetes: Secondary | ICD-10-CM | POA: Diagnosis present

## 2015-06-02 DIAGNOSIS — E785 Hyperlipidemia, unspecified: Secondary | ICD-10-CM

## 2015-06-02 DIAGNOSIS — R0602 Shortness of breath: Secondary | ICD-10-CM | POA: Diagnosis not present

## 2015-06-02 DIAGNOSIS — T380X5A Adverse effect of glucocorticoids and synthetic analogues, initial encounter: Secondary | ICD-10-CM | POA: Diagnosis present

## 2015-06-02 DIAGNOSIS — J441 Chronic obstructive pulmonary disease with (acute) exacerbation: Secondary | ICD-10-CM | POA: Diagnosis not present

## 2015-06-02 DIAGNOSIS — Z792 Long term (current) use of antibiotics: Secondary | ICD-10-CM | POA: Insufficient documentation

## 2015-06-02 DIAGNOSIS — Z79899 Other long term (current) drug therapy: Secondary | ICD-10-CM

## 2015-06-02 DIAGNOSIS — T50905A Adverse effect of unspecified drugs, medicaments and biological substances, initial encounter: Secondary | ICD-10-CM

## 2015-06-02 DIAGNOSIS — Z7951 Long term (current) use of inhaled steroids: Secondary | ICD-10-CM

## 2015-06-02 DIAGNOSIS — Z87891 Personal history of nicotine dependence: Secondary | ICD-10-CM | POA: Insufficient documentation

## 2015-06-02 HISTORY — DX: Prediabetes: R73.03

## 2015-06-02 LAB — BLOOD GAS, ARTERIAL
ACID-BASE EXCESS: 1.8 mmol/L (ref 0.0–2.0)
Bicarbonate: 25.7 mEq/L — ABNORMAL HIGH (ref 20.0–24.0)
DRAWN BY: 22223
FIO2: 21
O2 Saturation: 93.4 %
PATIENT TEMPERATURE: 37
TCO2: 16.3 mmol/L (ref 0–100)
pCO2 arterial: 43.4 mmHg (ref 35.0–45.0)
pH, Arterial: 7.397 (ref 7.350–7.450)
pO2, Arterial: 72.4 mmHg — ABNORMAL LOW (ref 80.0–100.0)

## 2015-06-02 LAB — CBC
HEMATOCRIT: 37.7 % — AB (ref 39.0–52.0)
HEMOGLOBIN: 12.6 g/dL — AB (ref 13.0–17.0)
MCH: 29.4 pg (ref 26.0–34.0)
MCHC: 33.4 g/dL (ref 30.0–36.0)
MCV: 88.1 fL (ref 78.0–100.0)
Platelets: 236 10*3/uL (ref 150–400)
RBC: 4.28 MIL/uL (ref 4.22–5.81)
RDW: 14.4 % (ref 11.5–15.5)
WBC: 12.6 10*3/uL — ABNORMAL HIGH (ref 4.0–10.5)

## 2015-06-02 LAB — BASIC METABOLIC PANEL
ANION GAP: 11 (ref 5–15)
BUN: 8 mg/dL (ref 6–20)
CO2: 29 mmol/L (ref 22–32)
Calcium: 9.7 mg/dL (ref 8.9–10.3)
Chloride: 99 mmol/L — ABNORMAL LOW (ref 101–111)
Creatinine, Ser: 0.75 mg/dL (ref 0.61–1.24)
GFR calc non Af Amer: >60 mL/min (ref >60)
Glucose, Bld: 123 mg/dL — ABNORMAL HIGH (ref 65–99)
POTASSIUM: 3.7 mmol/L (ref 3.5–5.1)
Sodium: 139 mmol/L (ref 135–145)

## 2015-06-02 MED ORDER — POLYETHYLENE GLYCOL 3350 17 G PO PACK: 17.0000 g | PACK | Freq: Every day | ORAL | Status: DC | PRN

## 2015-06-02 MED ORDER — ONDANSETRON HCL 4 MG PO TABS: 4.0000 mg | ORAL_TABLET | Freq: Four times a day (QID) | ORAL | Status: DC | PRN

## 2015-06-02 MED ORDER — SIMVASTATIN 20 MG PO TABS
20.0000 mg | ORAL_TABLET | Freq: Every day | ORAL | Status: DC
  Administered 2015-06-03 (×2): 20 mg via ORAL
  Filled 2015-06-02 (×2): qty 1

## 2015-06-02 MED ORDER — PANTOPRAZOLE SODIUM 40 MG PO TBEC
40.0000 mg | DELAYED_RELEASE_TABLET | Freq: Every day | ORAL | Status: DC
  Administered 2015-06-03 – 2015-06-04 (×2): 40 mg via ORAL
  Filled 2015-06-02 (×3): qty 1

## 2015-06-02 MED ORDER — ONDANSETRON HCL 4 MG/2ML IJ SOLN: 4.0000 mg | Freq: Four times a day (QID) | INTRAMUSCULAR | Status: DC | PRN

## 2015-06-02 MED ORDER — ACETAMINOPHEN 325 MG PO TABS: 650.0000 mg | ORAL_TABLET | Freq: Four times a day (QID) | ORAL | Status: DC | PRN

## 2015-06-02 MED ORDER — POTASSIUM CHLORIDE CRYS ER 20 MEQ PO TBCR
20.0000 meq | EXTENDED_RELEASE_TABLET | Freq: Every day | ORAL | Status: DC
  Administered 2015-06-03 – 2015-06-04 (×2): 20 meq via ORAL
  Filled 2015-06-02 (×2): qty 1

## 2015-06-02 MED ORDER — IPRATROPIUM-ALBUTEROL 0.5-2.5 (3) MG/3ML IN SOLN: 3.0000 mL | RESPIRATORY_TRACT | Status: DC

## 2015-06-02 MED ORDER — ALUM & MAG HYDROXIDE-SIMETH 200-200-20 MG/5ML PO SUSP: 30.0000 mL | Freq: Four times a day (QID) | ORAL | Status: DC | PRN

## 2015-06-02 MED ORDER — ENOXAPARIN SODIUM 40 MG/0.4ML ~~LOC~~ SOLN
40.0000 mg | SUBCUTANEOUS | Status: DC
  Administered 2015-06-02 – 2015-06-03 (×2): 40 mg via SUBCUTANEOUS
  Filled 2015-06-02: qty 0.4

## 2015-06-02 MED ORDER — HYDROCODONE-ACETAMINOPHEN 5-325 MG PO TABS: 1.0000 | ORAL_TABLET | ORAL | Status: DC | PRN

## 2015-06-02 MED ORDER — METHYLPREDNISOLONE SODIUM SUCC 125 MG IJ SOLR
80.0000 mg | Freq: Once | INTRAMUSCULAR | Status: AC
  Administered 2015-06-03: 80 mg via INTRAVENOUS
  Filled 2015-06-02: qty 2

## 2015-06-02 MED ORDER — ACETAMINOPHEN 650 MG RE SUPP: 650.0000 mg | Freq: Four times a day (QID) | RECTAL | Status: DC | PRN

## 2015-06-02 MED ORDER — METOPROLOL SUCCINATE ER 50 MG PO TB24
50.0000 mg | ORAL_TABLET | Freq: Every morning | ORAL | Status: DC
  Administered 2015-06-03 – 2015-06-04 (×2): 50 mg via ORAL
  Filled 2015-06-02 (×2): qty 1

## 2015-06-02 MED ORDER — METHYLPREDNISOLONE SODIUM SUCC 125 MG IJ SOLR
80.0000 mg | Freq: Once | INTRAMUSCULAR | Status: AC
  Administered 2015-06-02: 80 mg via INTRAVENOUS
  Filled 2015-06-02: qty 2

## 2015-06-02 MED ORDER — ISOSORBIDE MONONITRATE ER 60 MG PO TB24
30.0000 mg | ORAL_TABLET | Freq: Every day | ORAL | Status: DC
  Administered 2015-06-03 – 2015-06-04 (×2): 30 mg via ORAL
  Filled 2015-06-02 (×2): qty 1

## 2015-06-02 MED ORDER — HYDROMORPHONE HCL 1 MG/ML IJ SOLN: 0.5000 mg | INTRAMUSCULAR | Status: DC | PRN

## 2015-06-02 MED ORDER — SODIUM CHLORIDE 0.9% FLUSH
3.0000 mL | Freq: Two times a day (BID) | INTRAVENOUS | Status: DC
  Administered 2015-06-02 – 2015-06-04 (×3): 3 mL via INTRAVENOUS

## 2015-06-02 MED ORDER — SODIUM CHLORIDE 0.9% FLUSH: 3.0000 mL | INTRAVENOUS | Status: DC | PRN

## 2015-06-02 MED ORDER — SODIUM CHLORIDE 0.9% FLUSH
3.0000 mL | Freq: Two times a day (BID) | INTRAVENOUS | Status: DC
  Administered 2015-06-03: 3 mL via INTRAVENOUS

## 2015-06-02 MED ORDER — CHLORTHALIDONE 25 MG PO TABS
25.0000 mg | ORAL_TABLET | Freq: Every day | ORAL | Status: DC
  Administered 2015-06-03 – 2015-06-04 (×2): 25 mg via ORAL
  Filled 2015-06-02 (×3): qty 1

## 2015-06-02 MED ORDER — ASPIRIN EC 81 MG PO TBEC
81.0000 mg | DELAYED_RELEASE_TABLET | Freq: Every day | ORAL | Status: DC
  Administered 2015-06-03 – 2015-06-04 (×2): 81 mg via ORAL
  Filled 2015-06-02 (×2): qty 1

## 2015-06-02 MED ORDER — ALBUTEROL (5 MG/ML) CONTINUOUS INHALATION SOLN
10.0000 mg/h | INHALATION_SOLUTION | RESPIRATORY_TRACT | Status: AC
  Administered 2015-06-02: 10 mg/h via RESPIRATORY_TRACT
  Filled 2015-06-02: qty 20

## 2015-06-02 MED ORDER — SODIUM CHLORIDE 0.9 % IV SOLN: 250.0000 mL | INTRAVENOUS | Status: DC | PRN

## 2015-06-02 MED ORDER — ALBUTEROL SULFATE (2.5 MG/3ML) 0.083% IN NEBU
5.0000 mg | INHALATION_SOLUTION | Freq: Once | RESPIRATORY_TRACT | Status: AC
  Administered 2015-06-02: 5 mg via RESPIRATORY_TRACT
  Filled 2015-06-02: qty 6

## 2015-06-02 MED ORDER — GABAPENTIN 300 MG PO CAPS
300.0000 mg | ORAL_CAPSULE | Freq: Two times a day (BID) | ORAL | Status: DC
  Administered 2015-06-02 – 2015-06-04 (×4): 300 mg via ORAL
  Filled 2015-06-02 (×3): qty 1
  Filled 2015-06-02: qty 3

## 2015-06-02 MED ORDER — ALBUTEROL SULFATE (2.5 MG/3ML) 0.083% IN NEBU: 2.5000 mg | INHALATION_SOLUTION | RESPIRATORY_TRACT | Status: DC

## 2015-06-02 MED ORDER — PREDNISONE 20 MG PO TABS
40.0000 mg | ORAL_TABLET | Freq: Once | ORAL | Status: AC
  Administered 2015-06-02: 40 mg via ORAL
  Filled 2015-06-02: qty 2

## 2015-06-02 MED ORDER — ONDANSETRON HCL 4 MG/2ML IJ SOLN: 4.0000 mg | Freq: Three times a day (TID) | INTRAMUSCULAR | Status: AC | PRN

## 2015-06-02 NOTE — ED Notes (Signed)
Pt c/o sob that started x 1 day ago; pt denies any cough but is having some central chest pain

## 2015-06-02 NOTE — ED Provider Notes (Signed)
CSN: [id number]     Arrival date & time 06/02/15  9147 History   First MD Initiated Contact with Patient 06/02/15 [phone]    Chief Complaint  Patient presents with  . Shortness of Breath     (Consider location/radiation/quality/duration/timing/severity/associated sxs/prior Treatment) HPI patient is a former smoker who has a history of COPD. He has a nebulizer at home. He states he started getting shortness of breath yesterday. The last time he used his nebulizer was at 5 PM. He states when he uses his nebulizer it helps however he does get short of breath later. He states he's having "mild" wheezing, he denies cough, fever, and he denies chest pain to me, no sore throat or rhinorrhea. He states if he exerts himself he gets short of breath. Patient was just seen on January 18 with some atypical chest pain and COPD exacerbation he was given oral steroids with a short course at that time.  PCP Dr. Olena Leatherwood in [place]  Past Medical History  Diagnosis Date  . Fasting hyperglycemia     Normal hemoglobin A1c of 6.0  . Hypertension   . Tobacco abuse   . Chest pain 04/2011    Normal echocardiogram  . COPD (chronic obstructive pulmonary disease) (HCC)   . Hyperlipemia    Past Surgical History  Procedure Laterality Date  . Appendectomy    . Appendectomy    . Left heart catheterization with coronary angiogram N/A 05/17/2011    Procedure: LEFT HEART CATHETERIZATION WITH CORONARY ANGIOGRAM;  Surgeon: Tonny Bollman, MD;  Location: [facility] CATH LAB;  Service: Cardiovascular;  Laterality: N/A;   Family History  Problem Relation Age of Onset  . Emphysema Father    Social History  Substance Use Topics  . Smoking status: Former Smoker -- 1.00 packs/day for 35 years    Types: Cigarettes    Quit date: 05/09/2013  . Smokeless tobacco: Never Used  . Alcohol Use: No  lives at home Lives with spouse retired   Review of Systems  All other systems reviewed and are negative.     Allergies   Codeine  Home Medications   Prior to Admission medications   Medication Sig Start Date End Date Taking? Authorizing Provider  albuterol (PROVENTIL HFA;VENTOLIN HFA) 108 (90 BASE) MCG/ACT inhaler Inhale 2 puffs into the lungs every 6 (six) hours as needed for wheezing or shortness of breath.    Historical Provider, MD  aspirin EC 81 MG tablet Take 81 mg by mouth daily.    Historical Provider, MD  budesonide-formoterol (SYMBICORT) 160-4.5 MCG/ACT inhaler Inhale 2 puffs into the lungs 2 (two) times daily.    Historical Provider, MD  chlorthalidone (HYGROTON) 25 MG tablet Take 25 mg by mouth daily.     Historical Provider, MD  doxycycline (VIBRAMYCIN) 100 MG capsule Take 1 capsule (100 mg total) by mouth 2 (two) times daily. 05/21/15   Shon Baton, MD  gabapentin (NEURONTIN) 300 MG capsule Take 300 mg by mouth 2 (two) times daily.  03/22/13   Historical Provider, MD  HYDROcodone-acetaminophen (NORCO/VICODIN) 5-325 MG tablet Take 1 tablet by mouth every 4 (four) hours as needed. 03/13/15   Historical Provider, MD  ibuprofen (ADVIL,MOTRIN) 600 MG tablet Take 600 mg by mouth 3 (three) times daily as needed. 03/04/15   Historical Provider, MD  ipratropium-albuterol (DUONEB) 0.5-2.5 (3) MG/3ML SOLN Inhale 3 mLs into the lungs every 6 (six) hours as needed (for shortness of breath/wheezing).  07/17/14   Historical Provider, MD  isosorbide mononitrate (IMDUR)  30 MG 24 hr tablet Take 30 mg by mouth daily. 03/22/14   Historical Provider, MD  lansoprazole (PREVACID) 30 MG capsule Take 30 mg by mouth daily.  10/17/14   Historical Provider, MD  LORazepam (ATIVAN) 0.5 MG tablet Take 2 tablets (1 mg total) by mouth every 4 (four) hours as needed for anxiety. 03/21/15   Bethann Berkshire, MD  metoprolol succinate (TOPROL-XL) 50 MG 24 hr tablet Take 50 mg by mouth every morning. Take with or immediately following a meal.    Historical Provider, MD  multivitamin (ONE-A-DAY MEN'S) TABS Take 1 tablet by mouth daily.       Historical Provider, MD  pantoprazole (PROTONIX) 20 MG tablet Take 1 tablet (20 mg total) by mouth daily. Patient not taking: Reported on 08/11/2014 05/05/14   Bethann Berkshire, MD  penicillin v potassium (VEETID) 500 MG tablet Take 500 mg by mouth 4 (four) times daily. 03/04/15   Historical Provider, MD  polyethylene glycol (MIRALAX / GLYCOLAX) packet Take 17 g by mouth daily as needed for mild constipation.    Historical Provider, MD  potassium chloride SA (K-DUR,KLOR-CON) 20 MEQ tablet Take 20 mEq by mouth daily.  05/12/14   Historical Provider, MD  predniSONE (DELTASONE) 20 MG tablet Take 3 tablets (60 mg total) by mouth once. 05/21/15   Shon Baton, MD  ranitidine (ZANTAC) 300 MG tablet Take 300 mg by mouth at bedtime.    Historical Provider, MD  simvastatin (ZOCOR) 20 MG tablet Take 20 mg by mouth at bedtime.  03/22/14   Historical Provider, MD  tiotropium (SPIRIVA) 18 MCG inhalation capsule Place 18 mcg into inhaler and inhale every morning.    Historical Provider, MD   BP 133/89 mmHg  Pulse 91  Temp(Src) 97.9 F (36.6 C) (Oral)  Resp 18  Ht  (1.676 m)  Wt 120 lb (54.432 kg)  BMI 19.38 kg/m2  SpO2 99%  Vital signs normal   Physical Exam  Constitutional: He is oriented to person, place, and time. He appears well-developed and well-nourished.  Non-toxic appearance. He does not appear ill. No distress.  HENT:  Head: Normocephalic and atraumatic.  Right Ear: External ear normal.  Left Ear: External ear normal.  Nose: Nose normal. No mucosal edema or rhinorrhea.  Mouth/Throat: Oropharynx is clear and moist and mucous membranes are normal. No dental abscesses or uvula swelling.  Eyes: Conjunctivae and EOM are normal. Pupils are equal, round, and reactive to light.  Neck: Normal range of motion and full passive range of motion without pain. Neck supple.  Cardiovascular: Normal rate, regular rhythm and normal heart sounds.  Exam reveals no gallop and no friction rub.   No murmur  heard. Pulmonary/Chest: Effort normal and breath sounds normal. No respiratory distress. He has no wheezes. He has no rhonchi. He has no rales. He exhibits no tenderness and no crepitus.  Talks in complete sentences without difficulty  Abdominal: Soft. Normal appearance and bowel sounds are normal. He exhibits no distension. There is no tenderness. There is no rebound and no guarding.  Musculoskeletal: Normal range of motion. He exhibits no edema or tenderness.  Moves all extremities well.   Neurological: He is alert and oriented to person, place, and time. He has normal strength. No cranial nerve deficit.  Skin: Skin is warm, dry and intact. No rash noted. No erythema. No pallor.  Psychiatric: He has a normal mood and affect. His speech is normal and behavior is normal. His mood appears not anxious.  Nursing note and vitals reviewed.   ED Course  Procedures (including critical care time)  Medications  albuterol (PROVENTIL) (2.5 MG/3ML) 0.083% nebulizer solution 5 mg (5 mg Nebulization Given 06/02/15 0641)    Because patient felt short of breath he was given an albuterol nebulizer.  Recheck at 7 AM patient has clear lungs. He states he's feeling better. He was ambulated by nursing staff and his pulse ox remained 95% with ambulation and it did not make him feel more short of breath. At this point he was felt ready for discharge. I have talked to patient and if he is feeling short of breath he can use his nebulizer at home.     EKG Interpretation  Date/Time:  Tuesday June 02 2015 06:04:44 EST Ventricular Rate:  97 PR Interval:  153 QRS Duration: 99 QT Interval:  327 QTC Calculation: 415 R Axis:   84 Text Interpretation:  Sinus rhythm Biatrial enlargement Borderline right axis deviation Borderline repolarization abnormality Baseline wander in lead(s) V3 No significant change since last tracing 20 May 2015 Confirmed by [facility]  MD-I, Kenyan Karnes ([id number]) on 06/02/2015 6:31:36 AM        MDM    patient has COPD and gets shortness of breath with exertion. He reports he gets relief if he uses his nebulizer at home however he had not used his nebulizer in over 12 hours when he came to the ED. He did not have any wheezing or overt signs of shortness of breath. He states he felt improved after a nebulizer and his pulse ox remained 95% with ambulation without worsening of his symptoms. Patient was encouraged to use his nebulizer at home to help control his symptoms.    Final diagnoses:  Shortness of breath    Plan discharge   [name], MD, Concha Pyo, MD 06/02/15 [phone]

## 2015-06-02 NOTE — ED Notes (Signed)
Pt ambulated on RA and stayed above 95%, steady gait, MD notified.

## 2015-06-02 NOTE — H&P (Signed)
[facility] Admission History and Physical       Jeffery [name] QMV:[id number] DOB: Sep 18, 1950 DOA: 06/02/2015  Referring physician: EDP PCP: Toma Deiters, MD  Specialists:   Chief Complaint: SOB and Wheezing  HPI: Jeffery [name] is a 65 y.o. male with a history of COPD, HTN, who presents to the ED with complaints of worsening SOB and wheezing x 2-3 days.   He denies and fever or chills.  He does report a nonproductive Cough.   He was seen in the ED in the AM on 06/02/2015 and treated with Nebulizer treatments and discharged to home but did not improve and returned.    He was administered IV Steroids, and Duonebs and referred for admission.     Review of Systems:  Constitutional: No Weight Loss, No Weight Gain, Night Sweats, Fevers, Chills, Dizziness, Light Headedness, Fatigue, or Generalized Weakness HEENT: No Headaches, Difficulty Swallowing,Tooth/Dental Problems,Sore Throat,  No Sneezing, Rhinitis, Ear Ache, Nasal Congestion, or Post Nasal Drip,  Cardio-vascular:  No Chest pain, Orthopnea, PND, Edema in Lower Extremities, Anasarca, Dizziness, Palpitations  Resp:  +Dyspnea, No DOE, No Productive Cough,  +Non-Productive Cough, No Hemoptysis, +Wheezing.    GI: No Heartburn, Indigestion, Abdominal Pain, Nausea, Vomiting, Diarrhea, Constipation, Hematemesis, Hematochezia, Melena, Change in Bowel Habits,  Loss of Appetite  GU: No Dysuria, No Change in Color of Urine, No Urgency or Urinary Frequency, No Flank pain.  Musculoskeletal: No Joint Pain or Swelling, No Decreased Range of Motion, No Back Pain.  Neurologic: No Syncope, No Seizures, Muscle Weakness, Paresthesia, Vision Disturbance or Loss, No Diplopia, No Vertigo, No Difficulty Walking,  Skin: No Rash or Lesions. Psych: No Change in Mood or Affect, No Depression or Anxiety, No Memory loss, No Confusion, or Hallucinations   Past Medical History  Diagnosis Date  . Fasting hyperglycemia     Normal hemoglobin A1c of  6.0  . Hypertension   . Tobacco abuse   . Chest pain 04/2011    Normal echocardiogram  . COPD (chronic obstructive pulmonary disease) (HCC)   . Hyperlipemia      Past Surgical History  Procedure Laterality Date  . Appendectomy    . Appendectomy    . Left heart catheterization with coronary angiogram N/A 05/17/2011    Procedure: LEFT HEART CATHETERIZATION WITH CORONARY ANGIOGRAM;  Surgeon: Tonny Bollman, MD;  Location: [facility] CATH LAB;  Service: Cardiovascular;  Laterality: N/A;      Prior to Admission medications   Medication Sig Start Date End Date Taking? Authorizing Provider  albuterol (PROVENTIL HFA;VENTOLIN HFA) 108 (90 BASE) MCG/ACT inhaler Inhale 2 puffs into the lungs every 6 (six) hours as needed for wheezing or shortness of breath.    Historical Provider, MD  aspirin EC 81 MG tablet Take 81 mg by mouth daily.    Historical Provider, MD  budesonide-formoterol (SYMBICORT) 160-4.5 MCG/ACT inhaler Inhale 2 puffs into the lungs 2 (two) times daily.    Historical Provider, MD  chlorthalidone (HYGROTON) 25 MG tablet Take 25 mg by mouth daily.     Historical Provider, MD  doxycycline (VIBRAMYCIN) 100 MG capsule Take 1 capsule (100 mg total) by mouth 2 (two) times daily. 05/21/15   Shon Baton, MD  gabapentin (NEURONTIN) 300 MG capsule Take 300 mg by mouth 2 (two) times daily.  03/22/13   Historical Provider, MD  HYDROcodone-acetaminophen (NORCO/VICODIN) 5-325 MG tablet Take 1 tablet by mouth every 4 (four) hours as needed. 03/13/15   Historical Provider, MD  ibuprofen (ADVIL,MOTRIN) 600  MG tablet Take 600 mg by mouth 3 (three) times daily as needed. 03/04/15   Historical Provider, MD  ipratropium-albuterol (DUONEB) 0.5-2.5 (3) MG/3ML SOLN Inhale 3 mLs into the lungs every 6 (six) hours as needed (for shortness of breath/wheezing).  07/17/14   Historical Provider, MD  isosorbide mononitrate (IMDUR) 30 MG 24 hr tablet Take 30 mg by mouth daily. 03/22/14   Historical Provider, MD   lansoprazole (PREVACID) 30 MG capsule Take 30 mg by mouth daily.  10/17/14   Historical Provider, MD  LORazepam (ATIVAN) 0.5 MG tablet Take 2 tablets (1 mg total) by mouth every 4 (four) hours as needed for anxiety. 03/21/15   Bethann Berkshire, MD  metoprolol succinate (TOPROL-XL) 50 MG 24 hr tablet Take 50 mg by mouth every morning. Take with or immediately following a meal.    Historical Provider, MD  multivitamin (ONE-A-DAY MEN'S) TABS Take 1 tablet by mouth daily.      Historical Provider, MD  penicillin v potassium (VEETID) 500 MG tablet Take 500 mg by mouth 4 (four) times daily. 03/04/15   Historical Provider, MD  polyethylene glycol (MIRALAX / GLYCOLAX) packet Take 17 g by mouth daily as needed for mild constipation.    Historical Provider, MD  potassium chloride SA (K-DUR,KLOR-CON) 20 MEQ tablet Take 20 mEq by mouth daily.  05/12/14   Historical Provider, MD  predniSONE (DELTASONE) 20 MG tablet Take 3 tablets (60 mg total) by mouth once. 05/21/15   Shon Baton, MD  ranitidine (ZANTAC) 300 MG tablet Take 300 mg by mouth at bedtime.    Historical Provider, MD  simvastatin (ZOCOR) 20 MG tablet Take 20 mg by mouth at bedtime.  03/22/14   Historical Provider, MD  tiotropium (SPIRIVA) 18 MCG inhalation capsule Place 18 mcg into inhaler and inhale every morning.    Historical Provider, MD     Allergies  Allergen Reactions  . Codeine Nausea Only    Social History:  reports that he quit smoking about 2 years ago. His smoking use included Cigarettes. He has a 35 pack-year smoking history. He has never used smokeless tobacco. He reports that he does not drink alcohol or use illicit drugs.    Family History  Problem Relation Age of Onset  . Emphysema Father        Physical Exam:  GEN:  Pleasant Thin  65 y.o. African American male examined and in no acute distress; cooperative with exam Filed Vitals:   06/02/15 1845 06/02/15 2043 06/02/15 2043  BP: 142/98  156/99  Pulse: 91  88  Temp:  98.3 F (36.8 C)    TempSrc: Oral    Resp: 24  22  Height:  (1.676 m)    Weight: 56.7 kg (125 lb)    SpO2: 100% 94% 100%   Blood pressure 156/99, pulse 88, temperature 98.3 F (36.8 C), temperature source Oral, resp. rate 22, height  (1.676 m), weight 56.7 kg (125 lb), SpO2 100 %. PSYCH: He is alert and oriented x4; does not appear anxious does not appear depressed; affect is normal HEENT: Normocephalic and Atraumatic, Mucous membranes pink; PERRLA; EOM intact; Fundi:  Benign;  No scleral icterus, Nares: Patent, Oropharynx: Clear,  Fair Dentition,    Neck:  FROM, No Cervical Lymphadenopathy nor Thyromegaly or Carotid Bruit; No JVD; Breasts:: Not examined CHEST WALL: No tenderness CHEST: Decreased Breath Sounds with Occasional Wheezes, No Rales, No Rhonchi HEART: Regular rate and rhythm; no murmurs rubs or gallops BACK: No kyphosis or  scoliosis; No CVA tenderness ABDOMEN: Positive Bowel Sounds, Soft Non-Tender, No Rebound or Guarding; No Masses, No Organomegaly,. Rectal Exam: Not done EXTREMITIES: No Cyanosis, Clubbing, or Edema; No Ulcerations. Genitalia: not examined PULSES: 2+ and symmetric SKIN: Normal hydration no rash or ulceration CNS:  Alert and Oriented x 4, No Focal Deficits Vascular: pulses palpable throughout    Labs on Admission:  Basic Metabolic Panel:  Recent Labs Lab 06/02/15 2029  NA 139  K 3.7  CL 99*  CO2 29  GLUCOSE 123*  BUN 8  CREATININE 0.75  CALCIUM 9.7   Liver Function Tests: No results for input(s): AST, ALT, ALKPHOS, BILITOT, PROT, ALBUMIN in the last 168 hours. No results for input(s): LIPASE, AMYLASE in the last 168 hours. No results for input(s): AMMONIA in the last 168 hours. CBC:  Recent Labs Lab 06/02/15 2029  WBC 12.6*  HGB 12.6*  HCT 37.7*  MCV 88.1  PLT 236   Cardiac Enzymes: No results for input(s): CKTOTAL, CKMB, CKMBINDEX, TROPONINI in the last 168 hours.  BNP (last 3 results)  Recent Labs  11/11/14 0720  03/21/15 2014  BNP 24.0 22.0    ProBNP (last 3 results) No results for input(s): PROBNP in the last 8760 hours.  CBG: No results for input(s): GLUCAP in the last 168 hours.  Radiological Exams on Admission: Dg Chest 2 View  06/02/2015  CLINICAL DATA:  Worsening shortness of breath. EXAM: CHEST  2 VIEW COMPARISON:  05/21/2015. FINDINGS: The cardiac silhouette, mediastinal and hilar contours are within normal limits and stable. The lungs demonstrate hyper inflation and attenuation of the pulmonary vasculature consistent with COPD. No acute pulmonary findings. No pleural effusion. The bony thorax is intact. IMPRESSION: Emphysematous changes but no acute overlying pulmonary process. Electronically Signed   By: Rudie Meyer M.D.   On: 06/02/2015 19:51     EKG: Independently reviewed.    Assessment/Plan:   65 y.o. male with  Active Problems:   COPD exacerbation (HCC)   Admitted to a Telemetry Bed, and placed on Duonebs and IV steroids to taper.  Monitor O2 sats, and NCO2 PRN    Medication reviewed and reconciled.   DVT prophylaxis with SQ Lovenox  Code Status:     FULL CODE        Family Communication:   Wife at Bedside     Disposition Plan:    Inpatient Status        Time spent:  55 Minutes      Ron Parker [facility] Pager [phone]   If 7AM -7PM Please Contact the Day Rounding Team MD for [facility]  If 7PM-7AM, Please Contact Night-Floor Coverage  [email or web address] Password [facility] 06/02/2015, 9:39 PM     ADDENDUM:   Patient was seen and examined on 06/02/2015

## 2015-06-02 NOTE — ED Notes (Signed)
PT states he was seen in the ED this am and SOB continues on exertion unrelieved with home nebs.

## 2015-06-02 NOTE — ED Notes (Signed)
Pt made aware to return if symptoms worsen or if any life threatening symptoms occur.   

## 2015-06-02 NOTE — Discharge Instructions (Signed)
You should nebulizer when you're having shortness of breath. You can use it every 4 hours if needed to control your breathing. Return to the ED if you get fever, start coughing up mucus, or you struggle to breathe despite using your nebulizer.

## 2015-06-02 NOTE — ED Provider Notes (Signed)
[id number]     Arrival date & time 06/02/15  1839 History  By signing my name below, I, Jeffery [name], attest that this documentation has been prepared under the direction and in the presence of Jeffery Hong, MD. Electronically Signed: Randell [name], ED Scribe. 06/02/2015. 9:38 PM.    Chief Complaint  [name] presents with  . Shortness of Breath   The history is provided by the [name]. No language interpreter was used.   HPI Comments: Jeffery [name] is a 65 y.o. male with an hx of COPD, HTN, and MI who presents to the Emergency Department complaining of constant, moderate, gradually worsening SOB and severe DOE onset yesterday. Per medical records, [name] has been frequently in the past for similar symptoms, most recently 15 hours ago in the [name] ED where he received breathing treatments which temporarily improved. He states that SOB has increased since arriving home. Per [name], he uses his rescue inhaler BID and his nebulizer TID. He denies any other symptoms currently.  Past Medical History  Diagnosis Date  . Fasting hyperglycemia     Normal hemoglobin A1c of 6.0  . Hypertension   . Tobacco abuse   . Chest pain 04/2011    Normal echocardiogram  . COPD (chronic obstructive pulmonary disease) (HCC)   . Hyperlipemia    Past Surgical History  Procedure Laterality Date  . Appendectomy    . Appendectomy    . Left heart catheterization with coronary angiogram N/A 05/17/2011    Procedure: LEFT HEART CATHETERIZATION WITH CORONARY ANGIOGRAM;  Surgeon: Jeffery Bollman, MD;  Location: [facility] CATH LAB;  Service: Cardiovascular;  Laterality: N/A;   Family History  Problem Relation Age of Onset  . Emphysema Father    Social History  Substance Use Topics  . Smoking status: Former Smoker -- 1.00 packs/day for 35 years    Types: Cigarettes    Quit date: 05/09/2013  . Smokeless tobacco: Never Used  . Alcohol Use: No    Review of Systems  Respiratory: Positive for  shortness of breath.        Positive for DOE  All other systems reviewed and are negative.     Allergies  Codeine  Home Medications   Prior to Admission medications   Medication Sig Start Date End Date Taking? Authorizing Provider  albuterol (PROVENTIL HFA;VENTOLIN HFA) 108 (90 BASE) MCG/ACT inhaler Inhale 2 puffs into the lungs every 6 (six) hours as needed for wheezing or shortness of breath.    Historical Provider, MD  aspirin EC 81 MG tablet Take 81 mg by mouth daily.    Historical Provider, MD  budesonide-formoterol (SYMBICORT) 160-4.5 MCG/ACT inhaler Inhale 2 puffs into the lungs 2 (two) times daily.    Historical Provider, MD  chlorthalidone (HYGROTON) 25 MG tablet Take 25 mg by mouth daily.     Historical Provider, MD  doxycycline (VIBRAMYCIN) 100 MG capsule Take 1 capsule (100 mg total) by mouth 2 (two) times daily. 05/21/15   Jeffery Baton, MD  gabapentin (NEURONTIN) 300 MG capsule Take 300 mg by mouth 2 (two) times daily.  03/22/13   Historical Provider, MD  HYDROcodone-acetaminophen (NORCO/VICODIN) 5-325 MG tablet Take 1 tablet by mouth every 4 (four) hours as needed. 03/13/15   Historical Provider, MD  ibuprofen (ADVIL,MOTRIN) 600 MG tablet Take 600 mg by mouth 3 (three) times daily as needed. 03/04/15   Historical Provider, MD  ipratropium-albuterol (DUONEB) 0.5-2.5 (3) MG/3ML SOLN Inhale 3 mLs into the lungs every 6 (six) hours as  needed (for shortness of breath/wheezing).  07/17/14   Historical Provider, MD  isosorbide mononitrate (IMDUR) 30 MG 24 hr tablet Take 30 mg by mouth daily. 03/22/14   Historical Provider, MD  lansoprazole (PREVACID) 30 MG capsule Take 30 mg by mouth daily.  10/17/14   Historical Provider, MD  LORazepam (ATIVAN) 0.5 MG tablet Take 2 tablets (1 mg total) by mouth every 4 (four) hours as needed for anxiety. 03/21/15   Jeffery Berkshire, MD  metoprolol succinate (TOPROL-XL) 50 MG 24 hr tablet Take 50 mg by mouth every morning. Take with or immediately  following a meal.    Historical Provider, MD  multivitamin (ONE-A-DAY MEN'S) TABS Take 1 tablet by mouth daily.      Historical Provider, MD  penicillin v potassium (VEETID) 500 MG tablet Take 500 mg by mouth 4 (four) times daily. 03/04/15   Historical Provider, MD  polyethylene glycol (MIRALAX / GLYCOLAX) packet Take 17 g by mouth daily as needed for mild constipation.    Historical Provider, MD  potassium chloride SA (K-DUR,KLOR-CON) 20 MEQ tablet Take 20 mEq by mouth daily.  05/12/14   Historical Provider, MD  predniSONE (DELTASONE) 20 MG tablet Take 3 tablets (60 mg total) by mouth once. 05/21/15   Jeffery Baton, MD  ranitidine (ZANTAC) 300 MG tablet Take 300 mg by mouth at bedtime.    Historical Provider, MD  simvastatin (ZOCOR) 20 MG tablet Take 20 mg by mouth at bedtime.  03/22/14   Historical Provider, MD  tiotropium (SPIRIVA) 18 MCG inhalation capsule Place 18 mcg into inhaler and inhale every morning.    Historical Provider, MD   BP 156/99 mmHg  Pulse 88  Temp(Src) 98.3 F (36.8 C) (Oral)  Resp 22  Ht  (1.676 m)  Wt 125 lb (56.7 kg)  BMI 20.19 kg/m2  SpO2 100% Physical Exam  Constitutional: He appears well-developed and well-nourished. No distress.  HENT:  Head: Normocephalic and atraumatic.  Mouth/Throat: Oropharynx is clear and moist. No oropharyngeal exudate.  Eyes: Conjunctivae and EOM are normal. Pupils are equal, round, and reactive to light. Right eye exhibits no discharge. Left eye exhibits no discharge. No scleral icterus.  Neck: Normal range of motion. Neck supple. No JVD present. No thyromegaly present.  Cardiovascular: Normal rate, regular rhythm, normal heart sounds and intact distal pulses.  Exam reveals no gallop and no friction rub.   No murmur heard. Pulmonary/Chest: Accessory muscle usage present. He is in respiratory distress. He has wheezes. He has no rales.  Pursed lip breathing. Decreased air movement. Mild expiratory wheezing. Accessory muslce use.   Abdominal: Soft. Bowel sounds are normal. He exhibits no distension and no mass. There is no tenderness.  Musculoskeletal: Normal range of motion. He exhibits no edema or tenderness.  Lymphadenopathy:    He has no cervical adenopathy.  Neurological: He is alert. Coordination normal.  Skin: Skin is warm and dry. No rash noted. No erythema.  Psychiatric: He has a normal mood and affect. His behavior is normal.  Nursing note and vitals reviewed.   ED Course  Procedures   DIAGNOSTIC STUDIES: Oxygen Saturation is 100% on RA, normal by my interpretation.    COORDINATION OF CARE: 8:14 PM Will order breathing treatment, prednisone, and labs. Discussed treatment plan with pt at bedside and pt agreed to plan.  Labs Review Labs Reviewed  BASIC METABOLIC PANEL - Abnormal; Notable for the following:    Chloride 99 (*)    Glucose, Bld 123 (*)  All other components within normal limits  CBC - Abnormal; Notable for the following:    WBC 12.6 (*)    Hemoglobin 12.6 (*)    HCT 37.7 (*)    All other components within normal limits  BLOOD GAS, ARTERIAL - Abnormal; Notable for the following:    pO2, Arterial 72.4 (*)    Bicarbonate 25.7 (*)    All other components within normal limits    Imaging Review Dg Chest 2 View  06/02/2015  CLINICAL DATA:  Worsening shortness of breath. EXAM: CHEST  2 VIEW COMPARISON:  05/21/2015. FINDINGS: The cardiac silhouette, mediastinal and hilar contours are within normal limits and stable. The lungs demonstrate hyper inflation and attenuation of the pulmonary vasculature consistent with COPD. No acute pulmonary findings. No pleural effusion. The bony thorax is intact. IMPRESSION: Emphysematous changes but no acute overlying pulmonary process. Electronically Signed   By: Rudie Meyer M.D.   On: 06/02/2015 19:51   I have personally reviewed and evaluated these images and lab results as part of my medical decision-making.    MDM   Final diagnoses:  COPD  exacerbation (HCC)    The [name] has received a couple of breathing treatments, he is still unable to lay down because of dyspnea, x-ray shows no infiltrates or pneumothorax, vital signs show that his oxygenation is adequate on some supplemental, his ABG does show mild hypoxia but no acidosis and no hypercapnia. Discussed the [name]'s care with the hospitalist who will admit overnight.  I personally performed the services described in this documentation, which was scribed in my presence. The recorded information has been reviewed and is accurate.     Medications  albuterol (PROVENTIL,VENTOLIN) solution continuous neb (10 mg/hr Nebulization New Bag/Given 06/02/15 2043)  predniSONE (DELTASONE) tablet 40 mg (40 mg Oral Given 06/02/15 2043)     Jeffery Hong, MD 06/02/15 2138

## 2015-06-03 DIAGNOSIS — T50905A Adverse effect of unspecified drugs, medicaments and biological substances, initial encounter: Secondary | ICD-10-CM

## 2015-06-03 DIAGNOSIS — I10 Essential (primary) hypertension: Secondary | ICD-10-CM

## 2015-06-03 DIAGNOSIS — R739 Hyperglycemia, unspecified: Secondary | ICD-10-CM | POA: Diagnosis not present

## 2015-06-03 DIAGNOSIS — J441 Chronic obstructive pulmonary disease with (acute) exacerbation: Principal | ICD-10-CM

## 2015-06-03 LAB — GLUCOSE, CAPILLARY
GLUCOSE-CAPILLARY: 142 mg/dL — AB (ref 65–99)
GLUCOSE-CAPILLARY: 142 mg/dL — AB (ref 65–99)

## 2015-06-03 LAB — BASIC METABOLIC PANEL
ANION GAP: 11 (ref 5–15)
BUN: 10 mg/dL (ref 6–20)
CO2: 28 mmol/L (ref 22–32)
Calcium: 9.5 mg/dL (ref 8.9–10.3)
Chloride: 100 mmol/L — ABNORMAL LOW (ref 101–111)
Creatinine, Ser: 0.78 mg/dL (ref 0.61–1.24)
GFR calc Af Amer: >60 mL/min (ref >60)
GFR calc non Af Amer: >60 mL/min (ref >60)
GLUCOSE: 187 mg/dL — AB (ref 65–99)
Potassium: 3.7 mmol/L (ref 3.5–5.1)
Sodium: 139 mmol/L (ref 135–145)

## 2015-06-03 LAB — CBC
HEMATOCRIT: 37.1 % — AB (ref 39.0–52.0)
HEMOGLOBIN: 12.2 g/dL — AB (ref 13.0–17.0)
MCH: 28.8 pg (ref 26.0–34.0)
MCHC: 32.9 g/dL (ref 30.0–36.0)
MCV: 87.5 fL (ref 78.0–100.0)
Platelets: 252 10*3/uL (ref 150–400)
RBC: 4.24 MIL/uL (ref 4.22–5.81)
RDW: 14.2 % (ref 11.5–15.5)
WBC: 8.4 10*3/uL (ref 4.0–10.5)

## 2015-06-03 MED ORDER — IPRATROPIUM-ALBUTEROL 0.5-2.5 (3) MG/3ML IN SOLN
3.0000 mL | Freq: Four times a day (QID) | RESPIRATORY_TRACT | Status: DC
  Administered 2015-06-03 (×3): 3 mL via RESPIRATORY_TRACT
  Filled 2015-06-03 (×3): qty 3

## 2015-06-03 MED ORDER — AZITHROMYCIN 250 MG PO TABS
500.0000 mg | ORAL_TABLET | Freq: Every day | ORAL | Status: AC
  Administered 2015-06-03: 500 mg via ORAL
  Filled 2015-06-03: qty 2

## 2015-06-03 MED ORDER — INSULIN ASPART 100 UNIT/ML ~~LOC~~ SOLN
0.0000 [IU] | Freq: Three times a day (TID) | SUBCUTANEOUS | Status: DC
  Administered 2015-06-03: 2 [IU] via SUBCUTANEOUS

## 2015-06-03 MED ORDER — PREDNISONE 20 MG PO TABS
50.0000 mg | ORAL_TABLET | Freq: Every day | ORAL | Status: DC
  Administered 2015-06-04: 50 mg via ORAL
  Filled 2015-06-03: qty 2

## 2015-06-03 MED ORDER — INSULIN ASPART 100 UNIT/ML ~~LOC~~ SOLN
0.0000 [IU] | Freq: Every day | SUBCUTANEOUS | Status: DC
Start: 2015-06-03 — End: 2015-06-04

## 2015-06-03 MED ORDER — AZITHROMYCIN 250 MG PO TABS
250.0000 mg | ORAL_TABLET | Freq: Every day | ORAL | Status: DC
  Administered 2015-06-04: 250 mg via ORAL
  Filled 2015-06-03: qty 1

## 2015-06-03 MED ORDER — ALBUTEROL SULFATE (2.5 MG/3ML) 0.083% IN NEBU
2.5000 mg | INHALATION_SOLUTION | RESPIRATORY_TRACT | Status: DC | PRN
Start: 2015-06-03 — End: 2015-06-04
  Administered 2015-06-03: 2.5 mg via RESPIRATORY_TRACT
  Filled 2015-06-03: qty 3

## 2015-06-03 NOTE — Progress Notes (Signed)
[facility] PROGRESS NOTE  LAKEN LOBATO WUJ:[id number] DOB: 08-10-1950 DOA: 06/02/2015 PCP: Toma Deiters, MD    Code Status: Full code Family Communication: Discussed with wife Disposition Plan: Discharge when clinically appropriate, possibly in the next 24 hours   Consultants:  None  Procedures:  None  Antibiotics:  Azithromycin, pending  HPI/Subjective: Patient says that he is wheezing less and is low short of breath. No significant cough.  Objective: Filed Vitals:   06/03/15 0700 06/03/15 1100  BP: 104/63 128/68  Pulse: 84 84  Temp: 98.6 F (37 C) 98.8 F (37.1 C)  Resp: 19 20   oxygen saturation 98% on room air.  Intake/Output Summary (Last 24 hours) at 06/03/15 1410 Last data filed at 06/03/15 1300  Gross per 24 hour  Intake    480 ml  Output   1525 ml  Net  -1045 ml   Filed Weights   06/02/15 1845 06/02/15 2304  Weight: 56.7 kg (125 lb) 52.799 kg (116 lb 6.4 oz)    Exam:   General:  Pleasant 65 year old man in no acute distress.  Cardiovascular: S1, S2, no murmurs rubs or gallops.  Respiratory: Rare wheeze, mostly clear; breathing nonlabored.  Abdomen: Positive bowel sounds, soft, nontender, nondistended.  Musculoskeletal/extremities: No pedal edema.   Data Reviewed: Basic Metabolic Panel:  Recent Labs Lab 06/02/15 2029 06/03/15 0647  NA 139 139  K 3.7 3.7  CL 99* 100*  CO2 29 28  GLUCOSE 123* 187*  BUN 8 10  CREATININE 0.75 0.78  CALCIUM 9.7 9.5   Liver Function Tests: No results for input(s): AST, ALT, ALKPHOS, BILITOT, PROT, ALBUMIN in the last 168 hours. No results for input(s): LIPASE, AMYLASE in the last 168 hours. No results for input(s): AMMONIA in the last 168 hours. CBC:  Recent Labs Lab 06/02/15 2029 06/03/15 0647  WBC 12.6* 8.4  HGB 12.6* 12.2*  HCT 37.7* 37.1*  MCV 88.1 87.5  PLT 236 252   Cardiac Enzymes: No results for input(s): CKTOTAL, CKMB, CKMBINDEX, TROPONINI in the last 168  hours. BNP (last 3 results)  Recent Labs  11/11/14 0720 03/21/15 2014  BNP 24.0 22.0    ProBNP (last 3 results) No results for input(s): PROBNP in the last 8760 hours.  CBG: No results for input(s): GLUCAP in the last 168 hours.  No results found for this or any previous visit (from the past 240 hour(s)).   Studies: Dg Chest 2 View  06/02/2015  CLINICAL DATA:  Worsening shortness of breath. EXAM: CHEST  2 VIEW COMPARISON:  05/21/2015. FINDINGS: The cardiac silhouette, mediastinal and hilar contours are within normal limits and stable. The lungs demonstrate hyper inflation and attenuation of the pulmonary vasculature consistent with COPD. No acute pulmonary findings. No pleural effusion. The bony thorax is intact. IMPRESSION: Emphysematous changes but no acute overlying pulmonary process. Electronically Signed   By: Rudie Meyer M.D.   On: 06/02/2015 19:51    Scheduled Meds: . aspirin EC  81 mg Oral Daily  . chlorthalidone  25 mg Oral Daily  . enoxaparin (LOVENOX) injection  40 mg Subcutaneous Q24H  . gabapentin  300 mg Oral BID  . ipratropium-albuterol  3 mL Nebulization Q6H  . isosorbide mononitrate  30 mg Oral Daily  . metoprolol succinate  50 mg Oral q morning - 10a  . pantoprazole  40 mg Oral Daily  . potassium chloride SA  20 mEq Oral Daily  . simvastatin  20 mg Oral QHS  . sodium chloride  flush  3 mL Intravenous Q12H  . sodium chloride flush  3 mL Intravenous Q12H   Continuous Infusions:  Assessment and plan: Principal Problem:   COPD exacerbation (HCC) Active Problems:   Hyperglycemia, drug-induced   Essential hypertension    1. COPD exacerbation. The patient was started on IV site Solu-Medrol and duo nebulizers. Will add azithromycin empirically. -He appears to be improving. Will decrease the Solu-Medrol. -Probable discharge tomorrow.  Hyperglycemia, thought to be secondary to Solu-Medrol. We'll add sliding scale NovoLog. Will check a hemoglobin  A1c.  Essential hypertension. Patient was restarted on his home medications metoprolol and chlorthalidone. His blood pressure has been stable.   Time spent: 25 minutes.    [facility]  [facility] Pager [phone]. If 7PM-7AM, please contact night-coverage at [email or web address], password [facility] 06/03/2015, 2:10 PM  LOS: 1 day

## 2015-06-03 NOTE — Care Management Note (Signed)
Case Management Note  Patient Details  Name: Jeffery [name] MRN: [id number] Date of Birth: 05-Jun-1950  Subjective/Objective:                Spoke with patient and spouse for discharge planning. Patient is from home and independent. Drives self and does not qualify for Home O2.     . No CM needs identified.   Action/Plan:  Home with self care. Expected Discharge Date:                  Expected Discharge Plan:  Home/Self Care  In-House Referral:     Discharge planning Services  CM Consult  Post Acute Care Choice:    Choice offered to:     DME Arranged:    DME Agency:     HH Arranged:    HH Agency:     Status of Service:  Completed, signed off  Medicare Important Message Given:    Date Medicare IM Given:    Medicare IM give by:    Date Additional Medicare IM Given:    Additional Medicare Important Message give by:     If discussed at Long Length of Stay Meetings, dates discussed:    Additional Comments:  Adonis Huguenin, RN 06/03/2015, 3:49 PM

## 2015-06-03 NOTE — Progress Notes (Signed)
SATURATION QUALIFICATIONS: (This note is used to comply with regulatory documentation for home oxygen)  Patient Saturations on Room Air at Rest = 97%  Patient Saturations on Room Air while Ambulating = 95-100%  Patient Saturations on N/A Liters of oxygen while Ambulating = N/A%  Please briefly explain why patient needs home oxygen:  Pt DOES NOT qualify for home oxygen.

## 2015-06-04 ENCOUNTER — Encounter (HOSPITAL_COMMUNITY): Payer: Self-pay | Admitting: Internal Medicine

## 2015-06-04 DIAGNOSIS — R7303 Prediabetes: Secondary | ICD-10-CM

## 2015-06-04 HISTORY — DX: Prediabetes: R73.03

## 2015-06-04 LAB — HEMOGLOBIN A1C
Hgb A1c MFr Bld: 6.6 % — ABNORMAL HIGH (ref 4.8–5.6)
Mean Plasma Glucose: 143 mg/dL

## 2015-06-04 LAB — GLUCOSE, CAPILLARY
GLUCOSE-CAPILLARY: 107 mg/dL — AB (ref 65–99)
GLUCOSE-CAPILLARY: 91 mg/dL (ref 65–99)

## 2015-06-04 MED ORDER — IPRATROPIUM-ALBUTEROL 0.5-2.5 (3) MG/3ML IN SOLN
3.0000 mL | Freq: Three times a day (TID) | RESPIRATORY_TRACT | Status: DC
  Administered 2015-06-04: 3 mL via RESPIRATORY_TRACT
  Filled 2015-06-04: qty 3

## 2015-06-04 MED ORDER — AZITHROMYCIN 250 MG PO TABS: ORAL_TABLET | ORAL | Status: DC

## 2015-06-04 MED ORDER — PREDNISONE 20 MG PO TABS: 50.0000 mg | ORAL_TABLET | Freq: Every day | ORAL | Status: DC

## 2015-06-04 MED ORDER — PREDNISONE 20 MG PO TABS: 20.0000 mg | ORAL_TABLET | Freq: Every day | ORAL | Status: DC

## 2015-06-04 NOTE — Progress Notes (Signed)
Pt discharged home today per Dr. Fisher.  Pt's IV site D/C'd and WDL.  Pt's VSS.  Pt provided with home medication list, discharge instructions and prescriptions.  Verbalized understanding.  Pt left floor via WC in stable condition accompanied by NT. 

## 2015-06-04 NOTE — Discharge Summary (Signed)
Physician Discharge Summary  ZAKKERY DORIAN WGN:[id number] DOB: 1950-08-14 DOA: 06/02/2015  PCP: Toma Deiters, MD  Admit date: 06/02/2015 Discharge date: 06/04/2015  Time spent: Greater than 30 minutes  Recommendations for Outpatient Follow-up:  1. Recommend follow-up of patient's capillary blood glucose and prediabetes in the outpatient setting. Consideration can be made for starting an oral agent.    Discharge Diagnoses:  1. Emphysema with COPD exacerbation. 2. Essential hypertension. 3. Prediabetes with steroid-induced hyperglycemia.   Discharge Condition: Improved.  Diet recommendation: Heart healthy.  Filed Weights   06/02/15 1845 06/02/15 2304  Weight: 56.7 kg (125 lb) 52.799 kg (116 lb 6.4 oz)    History of present illness:  Patient is a 65 year old man with COPD and hypertension, who presented to the ED on 06/02/2015 with a chief complaint of shortness of breath and wheezing. In the ED, he was oxygenating 99-100% on supplemental oxygen. He was afebrile and hemodynamically stable. His ABG on room air revealed a pH of 7.39, PCO2 of 43, and PO2 of 72.4. His chest x-ray revealed emphysematous changes, but with no acute overlying pulmonary process. He was admitted for further evaluation and management.  Hospital Course:  Patient was started on treatment with IV Solu-Medrol, DuoNeb nebulizers, and oxygen as needed. Azithromycin was added empirically. For hyperglycemia, thought to be secondary to Solu-Medrol, sliding scale NovoLog was started. He was maintained on metoprolol and chlorthalidone for treatment of his hypertension. The patient improved clinically and symptomatically. At the time of hospital discharge, he had no wheezes on exam. Of note, his hemoglobin A1c was 6.6, more indicative of prediabetes in the setting of history of and current by mouth and IV steroid treatment. At the time of discharge as the IV Solu-Medrol was tapered off, his CBG was only 91. Therefore, the  patient was instructed to follow a diet low in simple carbohydrates and sugar. Specifically, he was encouraged to avoid sugary beverages and to use sugar-free beverages. He voiced understanding. He was also instructed to discuss the diagnosis of "prediabetes" with his PCP, so it could be monitored in the outpatient setting. Further management will be deferred to his PCP.  Patient was discharged on several more days of prednisone and antibiotic therapy with azithromycin. He was instructed to restart his chronic inhalers including Spiriva and Symbicort and when necessary albuterol nebulizer.  Procedures:  None  Consultations:  None  Discharge Exam: Filed Vitals:   06/04/15 0208 06/04/15 0456  BP: 110/64 133/64  Pulse: 82 80  Temp: 98.3 F (36.8 C) 98.6 F (37 C)  Resp: 20 18   oxygen saturation 97% on room air.  General: Pleasant 65 year old man sitting up in bed, in no acute distress. Cardiovascular: S1, S2, no murmurs rubs or gallops. Respiratory: Decreased breath sounds at the bases, but lungs overall clear. Extremities: No pedal edema.  Discharge Instructions   Discharge Instructions    Diet - low sodium heart healthy    Complete by:  As directed      Increase activity slowly    Complete by:  As directed           Current Discharge Medication List    START taking these medications   Details  azithromycin (ZITHROMAX) 250 MG tablet Antibiotic to take daily for 3 more days starting Friday 06/05/15. Qty: 3 each, Refills: 0    predniSONE (DELTASONE) 20 MG tablet Take 1 tablet (20 mg total) by mouth daily with breakfast. Take daily until completed , starting Friday 06/05/15. Qty: 3  tablet, Refills: 0      CONTINUE these medications which have NOT CHANGED   Details  albuterol (PROVENTIL HFA;VENTOLIN HFA) 108 (90 BASE) MCG/ACT inhaler Inhale 2 puffs into the lungs every 6 (six) hours as needed for wheezing or shortness of breath.    aspirin EC 81 MG tablet Take 81 mg by  mouth daily.    budesonide-formoterol (SYMBICORT) 160-4.5 MCG/ACT inhaler Inhale 2 puffs into the lungs 2 (two) times daily.    chlorthalidone (HYGROTON) 25 MG tablet Take 25 mg by mouth daily.     gabapentin (NEURONTIN) 300 MG capsule Take 300 mg by mouth 2 (two) times daily.     ipratropium-albuterol (DUONEB) 0.5-2.5 (3) MG/3ML SOLN Inhale 3 mLs into the lungs every 6 (six) hours as needed (for shortness of breath/wheezing).  Refills: 0    isosorbide mononitrate (IMDUR) 30 MG 24 hr tablet Take 30 mg by mouth daily. Refills: 1    LORazepam (ATIVAN) 0.5 MG tablet Take 2 tablets (1 mg total) by mouth every 4 (four) hours as needed for anxiety. Qty: 20 tablet, Refills: 0    metoprolol succinate (TOPROL-XL) 50 MG 24 hr tablet Take 50 mg by mouth every morning. Take with or immediately following a meal.    multivitamin (ONE-A-DAY MEN'S) TABS Take 1 tablet by mouth daily.      omeprazole (PRILOSEC) 20 MG capsule Take 20 mg by mouth daily.  Refills: 1    polyethylene glycol (MIRALAX / GLYCOLAX) packet Take 17 g by mouth daily as needed for mild constipation.    potassium chloride SA (K-DUR,KLOR-CON) 20 MEQ tablet Take 20 mEq by mouth daily.  Refills: 1    ranitidine (ZANTAC) 300 MG tablet Take 300 mg by mouth at bedtime.    simvastatin (ZOCOR) 20 MG tablet Take 20 mg by mouth at bedtime.  Refills: 1    tiotropium (SPIRIVA) 18 MCG inhalation capsule Place 18 mcg into inhaler and inhale every morning.      STOP taking these medications     lansoprazole (PREVACID) 30 MG capsule        Allergies  Allergen Reactions  . Codeine Nausea Only   Follow-up Information    Follow up with Toma Deiters, MD On 06/12/2015.   Specialty:  Internal Medicine   Why:  at 3:15 pm   Contact information:   [address] [place] [address] [phone]        The results of significant diagnostics from this hospitalization (including imaging, microbiology, ancillary and laboratory)  are listed below for reference.    Significant Diagnostic Studies: Dg Chest 2 View  06/02/2015  CLINICAL DATA:  Worsening shortness of breath. EXAM: CHEST  2 VIEW COMPARISON:  05/21/2015. FINDINGS: The cardiac silhouette, mediastinal and hilar contours are within normal limits and stable. The lungs demonstrate hyper inflation and attenuation of the pulmonary vasculature consistent with COPD. No acute pulmonary findings. No pleural effusion. The bony thorax is intact. IMPRESSION: Emphysematous changes but no acute overlying pulmonary process. Electronically Signed   By: Rudie Meyer M.D.   On: 06/02/2015 19:51   Dg Chest 2 View  05/21/2015  CLINICAL DATA:  Intermittent gradually worsening LEFT chest pain for 3-4 days, shortness of breath. History of hypertension, COPD. Former smoker. EXAM: CHEST  2 VIEW COMPARISON:  Chest radiograph April 07, 2015 FINDINGS: Cardiomediastinal silhouette is normal. Apical bullous changes with increased lung volumes consistent with COPD. No pleural effusion or focal consolidation. No pneumothorax. Soft tissue planes and  included osseous structure nonsuspicious. IMPRESSION: COPD, no superimposed acute cardiopulmonary process. Electronically Signed   By: Awilda Metro M.D.   On: 05/21/2015 00:47    Microbiology: No results found for this or any previous visit (from the past 240 hour(s)).   Labs: Basic Metabolic Panel:  Recent Labs Lab 06/02/15 2029 06/03/15 0647  NA 139 139  K 3.7 3.7  CL 99* 100*  CO2 29 28  GLUCOSE 123* 187*  BUN 8 10  CREATININE 0.75 0.78  CALCIUM 9.7 9.5   Liver Function Tests: No results for input(s): AST, ALT, ALKPHOS, BILITOT, PROT, ALBUMIN in the last 168 hours. No results for input(s): LIPASE, AMYLASE in the last 168 hours. No results for input(s): AMMONIA in the last 168 hours. CBC:  Recent Labs Lab 06/02/15 2029 06/03/15 0647  WBC 12.6* 8.4  HGB 12.6* 12.2*  HCT 37.7* 37.1*  MCV 88.1 87.5  PLT 236 252    Cardiac Enzymes: No results for input(s): CKTOTAL, CKMB, CKMBINDEX, TROPONINI in the last 168 hours. BNP: BNP (last 3 results)  Recent Labs  11/11/14 0720 03/21/15 2014  BNP 24.0 22.0    ProBNP (last 3 results) No results for input(s): PROBNP in the last 8760 hours.  CBG:  Recent Labs Lab 06/03/15 1623 06/03/15 2053 06/04/15 0719  GLUCAP 142* 142* 107*       Signed:  Maanav Kassabian MD.  [facility] 06/04/2015, 10:04 AM

## 2015-06-09 ENCOUNTER — Encounter (HOSPITAL_COMMUNITY): Payer: Self-pay | Admitting: Emergency Medicine

## 2015-06-09 ENCOUNTER — Emergency Department (HOSPITAL_COMMUNITY)
Admission: EM | Admit: 2015-06-09 | Discharge: 2015-06-09 | Disposition: A | Discharge status: Home / Self Care | Payer: BLUE CROSS/BLUE SHIELD | Source: Home / Self Care | Attending: Emergency Medicine | Admitting: Emergency Medicine

## 2015-06-09 ENCOUNTER — Emergency Department (HOSPITAL_COMMUNITY): Payer: BLUE CROSS/BLUE SHIELD

## 2015-06-09 ENCOUNTER — Emergency Department (HOSPITAL_COMMUNITY)
Admission: EM | Admit: 2015-06-09 | Discharge: 2015-06-09 | Disposition: A | Discharge status: Home / Self Care | Payer: BLUE CROSS/BLUE SHIELD | Attending: Emergency Medicine | Admitting: Emergency Medicine

## 2015-06-09 ENCOUNTER — Encounter (HOSPITAL_COMMUNITY): Payer: Self-pay

## 2015-06-09 DIAGNOSIS — Z87891 Personal history of nicotine dependence: Secondary | ICD-10-CM | POA: Insufficient documentation

## 2015-06-09 DIAGNOSIS — Z7982 Long term (current) use of aspirin: Secondary | ICD-10-CM | POA: Insufficient documentation

## 2015-06-09 DIAGNOSIS — R0602 Shortness of breath: Secondary | ICD-10-CM | POA: Diagnosis present

## 2015-06-09 DIAGNOSIS — Z9889 Other specified postprocedural states: Secondary | ICD-10-CM

## 2015-06-09 DIAGNOSIS — I10 Essential (primary) hypertension: Secondary | ICD-10-CM

## 2015-06-09 DIAGNOSIS — Z7951 Long term (current) use of inhaled steroids: Secondary | ICD-10-CM | POA: Insufficient documentation

## 2015-06-09 DIAGNOSIS — J069 Acute upper respiratory infection, unspecified: Secondary | ICD-10-CM | POA: Insufficient documentation

## 2015-06-09 DIAGNOSIS — Z79899 Other long term (current) drug therapy: Secondary | ICD-10-CM | POA: Insufficient documentation

## 2015-06-09 DIAGNOSIS — J441 Chronic obstructive pulmonary disease with (acute) exacerbation: Secondary | ICD-10-CM | POA: Insufficient documentation

## 2015-06-09 DIAGNOSIS — F419 Anxiety disorder, unspecified: Secondary | ICD-10-CM | POA: Insufficient documentation

## 2015-06-09 DIAGNOSIS — E785 Hyperlipidemia, unspecified: Secondary | ICD-10-CM | POA: Insufficient documentation

## 2015-06-09 LAB — CBC WITH DIFFERENTIAL/PLATELET
BASOS PCT: 0 %
Basophils Absolute: 0 10*3/uL (ref 0.0–0.1)
EOS ABS: 0.8 10*3/uL — AB (ref 0.0–0.7)
EOS PCT: 9 %
HCT: 39.8 % (ref 39.0–52.0)
Hemoglobin: 13.3 g/dL (ref 13.0–17.0)
LYMPHS ABS: 1.3 10*3/uL (ref 0.7–4.0)
Lymphocytes Relative: 16 %
MCH: 29.5 pg (ref 26.0–34.0)
MCHC: 33.4 g/dL (ref 30.0–36.0)
MCV: 88.2 fL (ref 78.0–100.0)
Monocytes Absolute: 1 10*3/uL (ref 0.1–1.0)
Monocytes Relative: 13 %
NEUTROS PCT: 62 %
Neutro Abs: 5 10*3/uL (ref 1.7–7.7)
PLATELETS: 276 10*3/uL (ref 150–400)
RBC: 4.51 MIL/uL (ref 4.22–5.81)
RDW: 14.1 % (ref 11.5–15.5)
WBC: 8 10*3/uL (ref 4.0–10.5)

## 2015-06-09 LAB — BASIC METABOLIC PANEL
ANION GAP: 8 (ref 5–15)
BUN: 9 mg/dL (ref 6–20)
CALCIUM: 9.1 mg/dL (ref 8.9–10.3)
CO2: 33 mmol/L — ABNORMAL HIGH (ref 22–32)
Chloride: 97 mmol/L — ABNORMAL LOW (ref 101–111)
Creatinine, Ser: 0.91 mg/dL (ref 0.61–1.24)
GFR calc Af Amer: >60 mL/min (ref >60)
GLUCOSE: 91 mg/dL (ref 65–99)
Potassium: 3.4 mmol/L — ABNORMAL LOW (ref 3.5–5.1)
SODIUM: 138 mmol/L (ref 135–145)

## 2015-06-09 LAB — TROPONIN I

## 2015-06-09 MED ORDER — LORAZEPAM 2 MG/ML IJ SOLN
0.5000 mg | Freq: Once | INTRAMUSCULAR | Status: AC
Start: 2015-06-09 — End: 2015-06-09
  Administered 2015-06-09: 0.5 mg via INTRAVENOUS
  Filled 2015-06-09: qty 1

## 2015-06-09 MED ORDER — ALBUTEROL SULFATE (2.5 MG/3ML) 0.083% IN NEBU: 2.5000 mg | INHALATION_SOLUTION | Freq: Once | RESPIRATORY_TRACT | Status: DC

## 2015-06-09 MED ORDER — IPRATROPIUM-ALBUTEROL 0.5-2.5 (3) MG/3ML IN SOLN
3.0000 mL | Freq: Once | RESPIRATORY_TRACT | Status: AC
  Administered 2015-06-09: 3 mL via RESPIRATORY_TRACT
  Filled 2015-06-09: qty 3

## 2015-06-09 MED ORDER — IPRATROPIUM-ALBUTEROL 0.5-2.5 (3) MG/3ML IN SOLN
3.0000 mL | Freq: Once | RESPIRATORY_TRACT | Status: DC
  Filled 2015-06-09: qty 3

## 2015-06-09 MED ORDER — METHYLPREDNISOLONE SODIUM SUCC 125 MG IJ SOLR
125.0000 mg | Freq: Once | INTRAMUSCULAR | Status: AC
  Administered 2015-06-09: 125 mg via INTRAVENOUS
  Filled 2015-06-09: qty 2

## 2015-06-09 MED ORDER — ALBUTEROL SULFATE (2.5 MG/3ML) 0.083% IN NEBU
2.5000 mg | INHALATION_SOLUTION | Freq: Once | RESPIRATORY_TRACT | Status: AC
  Administered 2015-06-09: 2.5 mg via RESPIRATORY_TRACT
  Filled 2015-06-09: qty 3

## 2015-06-09 MED ORDER — IPRATROPIUM-ALBUTEROL 0.5-2.5 (3) MG/3ML IN SOLN
3.0000 mL | Freq: Once | RESPIRATORY_TRACT | Status: AC
  Administered 2015-06-09: 3 mL via RESPIRATORY_TRACT

## 2015-06-09 NOTE — ED Notes (Signed)
Pt d/c from [facility] for SOB earlier. Pt states he ambulated to car and became extremely SOB. States he was diagnosed with an URI and was not given medication. MD Pickering aware. VSS.

## 2015-06-09 NOTE — ED Notes (Signed)
Pt told registration staff he was feeling worse and light headed.  Pt labored respirations.  89% on room air.  Placed pt on 02 at 2liters.  Dr. Rubin Payor aware.  Prmary RN at bedside.

## 2015-06-09 NOTE — ED Notes (Signed)
Pt removed from nasal canula to check O2 sats on room air.

## 2015-06-09 NOTE — Discharge Instructions (Signed)
Continue taking your nebulized treatments at home follow-up with your primary care doctor in 2-3 days for recheck

## 2015-06-09 NOTE — ED Notes (Signed)
Pt alert & oriented x4, stable gait. Patient given discharge instructions, paperwork & prescription(s). Patient verbalized understanding. Pt left department in wheelchair escorted by staff. Pt left w/ no further questions. 

## 2015-06-09 NOTE — Discharge Instructions (Signed)
Upper Respiratory Infection, Adult Most upper respiratory infections (URIs) are a viral infection of the air passages leading to the lungs. A URI affects the nose, throat, and upper air passages. The most common type of URI is nasopharyngitis and is typically referred to as "the common cold." URIs run their course and usually go away on their own. Most of the time, a URI does not require medical attention, but sometimes a bacterial infection in the upper airways can follow a viral infection. This is called a secondary infection. Sinus and middle ear infections are common types of secondary upper respiratory infections. Bacterial pneumonia can also complicate a URI. A URI can worsen asthma and chronic obstructive pulmonary disease (COPD). Sometimes, these complications can require emergency medical care and may be life threatening.  CAUSES Almost all URIs are caused by viruses. A virus is a type of germ and can spread from one person to another.  RISKS FACTORS You may be at risk for a URI if:   You smoke.   You have chronic heart or lung disease.  You have a weakened defense (immune) system.   You are very young or very old.   You have nasal allergies or asthma.  You work in crowded or poorly ventilated areas.  You work in health care facilities or schools. SIGNS AND SYMPTOMS  Symptoms typically develop 2-3 days after you come in contact with a cold virus. Most viral URIs last 7-10 days. However, viral URIs from the influenza virus (flu virus) can last 14-18 days and are typically more severe. Symptoms may include:   Runny or stuffy (congested) nose.   Sneezing.   Cough.   Sore throat.   Headache.   Fatigue.   Fever.   Loss of appetite.   Pain in your forehead, behind your eyes, and over your cheekbones (sinus pain).  Muscle aches.  DIAGNOSIS  Your health care provider may diagnose a URI by:  Physical exam.  Tests to check that your symptoms are not due to  another condition such as:  Strep throat.  Sinusitis.  Pneumonia.  Asthma. TREATMENT  A URI goes away on its own with time. It cannot be cured with medicines, but medicines may be prescribed or recommended to relieve symptoms. Medicines may help:  Reduce your fever.  Reduce your cough.  Relieve nasal congestion. HOME CARE INSTRUCTIONS   Take medicines only as directed by your health care provider.   Gargle warm saltwater or take cough drops to comfort your throat as directed by your health care provider.  Use a warm mist humidifier or inhale steam from a shower to increase air moisture. This may make it easier to breathe.  Drink enough fluid to keep your urine clear or pale yellow.   Eat soups and other clear broths and maintain good nutrition.   Rest as needed.   Return to work when your temperature has returned to normal or as your health care provider advises. You may need to stay home longer to avoid infecting others. You can also use a face mask and careful hand washing to prevent spread of the virus.  Increase the usage of your inhaler if you have asthma.   Do not use any tobacco products, including cigarettes, chewing tobacco, or electronic cigarettes. If you need help quitting, ask your health care provider. PREVENTION  The best way to protect yourself from getting a cold is to practice good hygiene.   Avoid oral or hand contact with people with cold   symptoms.   Wash your hands often if contact occurs.  There is no clear evidence that vitamin C, vitamin E, echinacea, or exercise reduces the chance of developing a cold. However, it is always recommended to get plenty of rest, exercise, and practice good nutrition.  SEEK MEDICAL CARE IF:   You are getting worse rather than better.   Your symptoms are not controlled by medicine.   You have chills.  You have worsening shortness of breath.  You have brown or red mucus.  You have yellow or brown nasal  discharge.  You have pain in your face, especially when you bend forward.  You have a fever.  You have swollen neck glands.  You have pain while swallowing.  You have white areas in the back of your throat. SEEK IMMEDIATE MEDICAL CARE IF:   You have severe or persistent:  Headache.  Ear pain.  Sinus pain.  Chest pain.  You have chronic lung disease and any of the following:  Wheezing.  Prolonged cough.  Coughing up blood.  A change in your usual mucus.  You have a stiff neck.  You have changes in your:  Vision.  Hearing.  Thinking.  Mood. MAKE SURE YOU:   Understand these instructions.  Will watch your condition.  Will get help right away if you are not doing well or get worse.   This information is not intended to replace advice given to you by your health care provider. Make sure you discuss any questions you have with your health care provider.   Document Released: 10/12/2000 Document Revised: 09/02/2014 Document Reviewed: 07/24/2013 Elsevier Interactive Patient Education 2016 [employer]  

## 2015-06-09 NOTE — ED Notes (Signed)
Pt reports cough, productive at times, sob, and chest pain since yesterday.  Reports history of COPD.

## 2015-06-09 NOTE — ED Notes (Signed)
Breathing tx complete. Pt states feeling better.

## 2015-06-09 NOTE — ED Provider Notes (Signed)
CSN: [id number]     Arrival date & time 06/09/15  1304 History   First MD Initiated Contact with Patient 06/09/15 1325     Chief Complaint  Patient presents with  . Shortness of Breath     Patient is a 65 y.o. male presenting with shortness of breath. The history is provided by the patient.  Shortness of Breath Severity:  Moderate Associated symptoms: chest pain and wheezing   Associated symptoms: no abdominal pain   patient recently shortness of breath and cough. History. States he thinks that he has a cold. History ofCOPD and has had a recent admission. No sick contact. No fevers. No chills. Occasional dull chest pain. No swelling in his legs. No production with the cough.   Past Medical History  Diagnosis Date  . Fasting hyperglycemia     Normal hemoglobin A1c of 6.0  . Hypertension   . Tobacco abuse   . Chest pain 04/2011    Normal echocardiogram  . COPD (chronic obstructive pulmonary disease) (HCC)   . Hyperlipemia   . Prediabetes 06/04/2015   Past Surgical History  Procedure Laterality Date  . Appendectomy    . Appendectomy    . Left heart catheterization with coronary angiogram N/A 05/17/2011    Procedure: LEFT HEART CATHETERIZATION WITH CORONARY ANGIOGRAM;  Surgeon: Tonny Bollman, MD;  Location: [facility] CATH LAB;  Service: Cardiovascular;  Laterality: N/A;   Family History  Problem Relation Age of Onset  . Emphysema Father    Social History  Substance Use Topics  . Smoking status: Former Smoker -- 1.00 packs/day for 35 years    Types: Cigarettes    Quit date: 05/09/2013  . Smokeless tobacco: Never Used  . Alcohol Use: No    Review of Systems  Constitutional: Positive for fatigue. Negative for appetite change.  Respiratory: Positive for shortness of breath and wheezing.   Cardiovascular: Positive for chest pain.  Gastrointestinal: Negative for abdominal pain.  Genitourinary: Negative for flank pain.  Musculoskeletal: Negative for back pain.  Skin: Negative for  wound.  Neurological: Negative for numbness.      Allergies  Codeine  Home Medications   Prior to Admission medications   Medication Sig Start Date End Date Taking? Authorizing Provider  albuterol (PROVENTIL HFA;VENTOLIN HFA) 108 (90 BASE) MCG/ACT inhaler Inhale 2 puffs into the lungs every 6 (six) hours as needed for wheezing or shortness of breath.   Yes Historical Provider, MD  aspirin EC 81 MG tablet Take 81 mg by mouth daily.   Yes Historical Provider, MD  budesonide-formoterol (SYMBICORT) 160-4.5 MCG/ACT inhaler Inhale 2 puffs into the lungs 2 (two) times daily.   Yes Historical Provider, MD  chlorthalidone (HYGROTON) 25 MG tablet Take 25 mg by mouth daily.    Yes Historical Provider, MD  gabapentin (NEURONTIN) 300 MG capsule Take 300 mg by mouth 2 (two) times daily.  03/22/13  Yes Historical Provider, MD  ipratropium-albuterol (DUONEB) 0.5-2.5 (3) MG/3ML SOLN Inhale 3 mLs into the lungs every 6 (six) hours as needed (for shortness of breath/wheezing).  07/17/14  Yes Historical Provider, MD  isosorbide mononitrate (IMDUR) 30 MG 24 hr tablet Take 30 mg by mouth daily. 03/22/14  Yes Historical Provider, MD  LORazepam (ATIVAN) 0.5 MG tablet Take 2 tablets (1 mg total) by mouth every 4 (four) hours as needed for anxiety. 03/21/15  Yes Bethann Berkshire, MD  metoprolol succinate (TOPROL-XL) 50 MG 24 hr tablet Take 50 mg by mouth every morning. Take with or immediately following  a meal.   Yes Historical Provider, MD  multivitamin (ONE-A-DAY MEN'S) TABS Take 1 tablet by mouth daily.     Yes Historical Provider, MD  omeprazole (PRILOSEC) 20 MG capsule Take 20 mg by mouth daily.  05/26/15  Yes Historical Provider, MD  polyethylene glycol (MIRALAX / GLYCOLAX) packet Take 17 g by mouth daily as needed for mild constipation.   Yes Historical Provider, MD  potassium chloride SA (K-DUR,KLOR-CON) 20 MEQ tablet Take 20 mEq by mouth daily.  05/12/14  Yes Historical Provider, MD  ranitidine (ZANTAC) 300 MG  tablet Take 300 mg by mouth at bedtime.   Yes Historical Provider, MD  simvastatin (ZOCOR) 20 MG tablet Take 20 mg by mouth at bedtime.  03/22/14  Yes Historical Provider, MD  tiotropium (SPIRIVA) 18 MCG inhalation capsule Place 18 mcg into inhaler and inhale every morning.   Yes Historical Provider, MD  azithromycin (ZITHROMAX) 250 MG tablet Antibiotic to take daily for 3 more days starting Friday 06/05/15. Patient not taking: Reported on 06/09/2015 06/04/15   Elliot Cousin, MD  predniSONE (DELTASONE) 20 MG tablet Take 1 tablet (20 mg total) by mouth daily with breakfast. Take daily until completed , starting Friday 06/05/15. Patient not taking: Reported on 06/09/2015 06/04/15   Elliot Cousin, MD   BP 116/89 mmHg  Pulse 18  Temp(Src) 98 F (36.7 C)  Resp 15  Ht  (1.676 m)  Wt 123 lb (55.792 kg)  BMI 19.86 kg/m2  SpO2 95% Physical Exam  Constitutional: He appears well-developed.  HENT:  Head: Atraumatic.  Neck: Neck supple.  Pulmonary/Chest:  Mild wheezing without respiratory distress.   Abdominal: Soft. There is no tenderness.  Musculoskeletal: He exhibits no edema.  Neurological: He is alert.  Skin: Skin is warm. No rash noted.    ED Course  Procedures (including critical care time) Labs Review Labs Reviewed  CBC WITH DIFFERENTIAL/PLATELET - Abnormal; Notable for the following:    Eosinophils Absolute 0.8 (*)    All other components within normal limits  BASIC METABOLIC PANEL - Abnormal; Notable for the following:    Potassium 3.4 (*)    Chloride 97 (*)    CO2 33 (*)    All other components within normal limits  TROPONIN I    Imaging Review Dg Chest 2 View  06/09/2015  CLINICAL DATA:  Shortness of breath with chest pain and cough for 1 day EXAM: CHEST  2 VIEW COMPARISON:  June 02, 2015 FINDINGS: Lungs are again noted to be somewhat hyperexpanded. There is slight scarring in the left base. There is no edema or consolidation. Heart size is within normal limits. There is  relative attenuation of pulmonary vascularity, a stable finding likely due to underlying emphysematous change. No adenopathy. There is degenerative change in the thoracic spine. IMPRESSION: Evidence a degree of underlying emphysematous change, stable. Mild scarring left base. No edema or consolidation. Electronically Signed   By: Bretta Bang III M.D.   On: 06/09/2015 14:21   I have personally reviewed and evaluated these images and lab results as part of my medical decision-making.   EKG Interpretation None      MDM   Final diagnoses:  URI (upper respiratory infection)  SOB (shortness of breath)    Patient with shortness of breath. Has had URI symptoms. Has had some harsh breath sounds improved with treatment. Patient was able to ambulate with pulse ox is the mid 90s. X-ray did not show pneumonia. Will discharge home.    Harrold Donath  Rubin Payor, MD 06/09/15 1723

## 2015-06-09 NOTE — ED Notes (Signed)
Pt ambulated with 94-96% 02, Dr. Rubin Payor notified.

## 2015-06-09 NOTE — ED Notes (Signed)
Pt made aware to return if symptoms worsen or if any life threatening symptoms occur.   

## 2015-06-09 NOTE — ED Provider Notes (Signed)
CSN: [id number]     Arrival date & time 06/09/15  1704 History   First MD Initiated Contact with Patient 06/09/15 1829     Chief Complaint  Patient presents with  . Shortness of Breath     (Consider location/radiation/quality/duration/timing/severity/associated sxs/prior Treatment) Patient is a 65 y.o. male presenting with shortness of breath. The history is provided by medical records, a relative and the patient (Patient complains of shortness of breath and wheezing. He was seen here in emergency department diagnosed with URI and bronchospasm when out of his car and began wheezing again).  Shortness of Breath Severity:  Mild Onset quality:  Sudden Timing:  Intermittent Progression:  Waxing and waning Chronicity:  Recurrent Context: activity   Associated symptoms: wheezing   Associated symptoms: no abdominal pain, no chest pain, no cough, no headaches and no rash     Past Medical History  Diagnosis Date  . Fasting hyperglycemia     Normal hemoglobin A1c of 6.0  . Hypertension   . Tobacco abuse   . Chest pain 04/2011    Normal echocardiogram  . COPD (chronic obstructive pulmonary disease) (HCC)   . Hyperlipemia   . Prediabetes 06/04/2015   Past Surgical History  Procedure Laterality Date  . Appendectomy    . Appendectomy    . Left heart catheterization with coronary angiogram N/A 05/17/2011    Procedure: LEFT HEART CATHETERIZATION WITH CORONARY ANGIOGRAM;  Surgeon: Tonny Bollman, MD;  Location: [facility] C[facility] LAB;  Service: Cardiovascular;  Laterality: N/A;   Family History  Problem Relation Age of Onset  . Emphysema Father    Social History  Substance Use Topics  . Smoking status: Former Smoker -- 1.00 packs/day for 35 years    Types: Cigarettes    Quit date: 05/09/2013  . Smokeless tobacco: Never Used  . Alcohol Use: No    Review of Systems  Constitutional: Negative for appetite change and fatigue.  HENT: Negative for congestion, ear discharge and sinus pressure.    Eyes: Negative for discharge.  Respiratory: Positive for shortness of breath and wheezing. Negative for cough.   Cardiovascular: Negative for chest pain.  Gastrointestinal: Negative for abdominal pain and diarrhea.  Genitourinary: Negative for frequency and hematuria.  Musculoskeletal: Negative for back pain.  Skin: Negative for rash.  Neurological: Negative for seizures and headaches.  Psychiatric/Behavioral: Negative for hallucinations.      Allergies  Codeine  Home Medications   Prior to Admission medications   Medication Sig Start Date End Date Taking? Authorizing Provider  albuterol (PROVENTIL HFA;VENTOLIN HFA) 108 (90 BASE) MCG/ACT inhaler Inhale 2 puffs into the lungs every 6 (six) hours as needed for wheezing or shortness of breath.   Yes Historical Provider, MD  aspirin EC 81 MG tablet Take 81 mg by mouth daily.   Yes Historical Provider, MD  budesonide-formoterol (SYMBICORT) 160-4.5 MCG/ACT inhaler Inhale 2 puffs into the lungs 2 (two) times daily.   Yes Historical Provider, MD  chlorthalidone (HYGROTON) 25 MG tablet Take 25 mg by mouth daily.    Yes Historical Provider, MD  gabapentin (NEURONTIN) 300 MG capsule Take 300 mg by mouth 2 (two) times daily.  03/22/13  Yes Historical Provider, MD  ipratropium-albuterol (DUONEB) 0.5-2.5 (3) MG/3ML SOLN Inhale 3 mLs into the lungs every 6 (six) hours as needed (for shortness of breath/wheezing).  07/17/14  Yes Historical Provider, MD  isosorbide mononitrate (IMDUR) 30 MG 24 hr tablet Take 30 mg by mouth daily. 03/22/14  Yes Historical Provider, MD  LORazepam (  ATIVAN) 0.5 MG tablet Take 2 tablets (1 mg total) by mouth every 4 (four) hours as needed for anxiety. 03/21/15  Yes Bethann Berkshire, MD  metoprolol succinate (TOPROL-XL) 50 MG 24 hr tablet Take 50 mg by mouth every morning. Take with or immediately following a meal.   Yes Historical Provider, MD  multivitamin (ONE-A-DAY MEN'S) TABS Take 1 tablet by mouth daily.     Yes Historical  Provider, MD  omeprazole (PRILOSEC) 20 MG capsule Take 20 mg by mouth daily.  05/26/15  Yes Historical Provider, MD  polyethylene glycol (MIRALAX / GLYCOLAX) packet Take 17 g by mouth daily as needed for mild constipation.   Yes Historical Provider, MD  potassium chloride SA (K-DUR,KLOR-CON) 20 MEQ tablet Take 20 mEq by mouth daily.  05/12/14  Yes Historical Provider, MD  ranitidine (ZANTAC) 300 MG tablet Take 300 mg by mouth at bedtime.   Yes Historical Provider, MD  simvastatin (ZOCOR) 20 MG tablet Take 20 mg by mouth at bedtime.  03/22/14  Yes Historical Provider, MD  tiotropium (SPIRIVA) 18 MCG inhalation capsule Place 18 mcg into inhaler and inhale every morning.   Yes Historical Provider, MD  azithromycin (ZITHROMAX) 250 MG tablet Antibiotic to take daily for 3 more days starting Friday 06/05/15. Patient not taking: Reported on 06/09/2015 06/04/15   Elliot Cousin, MD  predniSONE (DELTASONE) 20 MG tablet Take 1 tablet (20 mg total) by mouth daily with breakfast. Take daily until completed , starting Friday 06/05/15. Patient not taking: Reported on 06/09/2015 06/04/15   Elliot Cousin, MD   BP 133/82 mmHg  Pulse 100  Temp(Src) 97.9 F (36.6 C) (Oral)  Resp 19  Ht  (1.676 m)  Wt 123 lb (55.792 kg)  BMI 19.86 kg/m2  SpO2 93% Physical Exam  Constitutional: He is oriented to person, place, and time. He appears well-developed.  HENT:  Head: Normocephalic.  Eyes: Conjunctivae and EOM are normal. No scleral icterus.  Neck: Neck supple. No thyromegaly present.  Cardiovascular: Normal rate and regular rhythm.  Exam reveals no gallop and no friction rub.   No murmur heard. Pulmonary/Chest: No stridor. He has wheezes. He has no rales. He exhibits no tenderness.  Patient had upper airway wheezing  Abdominal: He exhibits no distension. There is no tenderness. There is no rebound.  Musculoskeletal: Normal range of motion. He exhibits no edema.  Lymphadenopathy:    He has no cervical adenopathy.   Neurological: He is oriented to person, place, and time. He exhibits normal muscle tone. Coordination normal.  Skin: No rash noted. No erythema.  Psychiatric:  Moderate anxiety    ED Course  Procedures (including critical care time) Labs Review Labs Reviewed - No data to display  Imaging Review Dg Chest 2 View  06/09/2015  CLINICAL DATA:  Shortness of breath with chest pain and cough for 1 day EXAM: CHEST  2 VIEW COMPARISON:  June 02, 2015 FINDINGS: Lungs are again noted to be somewhat hyperexpanded. There is slight scarring in the left base. There is no edema or consolidation. Heart size is within normal limits. There is relative attenuation of pulmonary vascularity, a stable finding likely due to underlying emphysematous change. No adenopathy. There is degenerative change in the thoracic spine. IMPRESSION: Evidence a degree of underlying emphysematous change, stable. Mild scarring left base. No edema or consolidation. Electronically Signed   By: Bretta Bang III M.D.   On: 06/09/2015 14:21   I have personally reviewed and evaluated these images and lab results as  part of my medical decision-making.   EKG Interpretation None      MDM   Final diagnoses:  URI (upper respiratory infection)  COPD exacerbation (HCC)    Patient was given 1 neb treatment along with some steroids and Ativan IV. Patient's wheezing improved and anxiety improved his room air sats at discharge is 92% with no wheezing no shortness of breath. Patient will be discharged to continue his neb treatments and follow-up with his PCP    Bethann Berkshire, MD 06/09/15 2054

## 2015-06-27 ENCOUNTER — Encounter (HOSPITAL_COMMUNITY): Payer: Self-pay | Admitting: *Deleted

## 2015-06-27 ENCOUNTER — Emergency Department (HOSPITAL_COMMUNITY)
Admission: EM | Admit: 2015-06-27 | Discharge: 2015-06-27 | Disposition: A | Discharge status: Home / Self Care | Payer: BLUE CROSS/BLUE SHIELD | Attending: Emergency Medicine | Admitting: Emergency Medicine

## 2015-06-27 ENCOUNTER — Emergency Department (HOSPITAL_COMMUNITY): Payer: BLUE CROSS/BLUE SHIELD

## 2015-06-27 DIAGNOSIS — Z7982 Long term (current) use of aspirin: Secondary | ICD-10-CM | POA: Insufficient documentation

## 2015-06-27 DIAGNOSIS — J441 Chronic obstructive pulmonary disease with (acute) exacerbation: Secondary | ICD-10-CM | POA: Diagnosis not present

## 2015-06-27 DIAGNOSIS — R0602 Shortness of breath: Secondary | ICD-10-CM | POA: Diagnosis present

## 2015-06-27 DIAGNOSIS — E785 Hyperlipidemia, unspecified: Secondary | ICD-10-CM | POA: Insufficient documentation

## 2015-06-27 DIAGNOSIS — Z9049 Acquired absence of other specified parts of digestive tract: Secondary | ICD-10-CM | POA: Insufficient documentation

## 2015-06-27 DIAGNOSIS — Z79899 Other long term (current) drug therapy: Secondary | ICD-10-CM | POA: Insufficient documentation

## 2015-06-27 DIAGNOSIS — Z87891 Personal history of nicotine dependence: Secondary | ICD-10-CM | POA: Insufficient documentation

## 2015-06-27 DIAGNOSIS — I10 Essential (primary) hypertension: Secondary | ICD-10-CM | POA: Insufficient documentation

## 2015-06-27 LAB — BASIC METABOLIC PANEL
Anion gap: 8 (ref 5–15)
BUN: 6 mg/dL (ref 6–20)
CALCIUM: 9.5 mg/dL (ref 8.9–10.3)
CO2: 30 mmol/L (ref 22–32)
Chloride: 104 mmol/L (ref 101–111)
Creatinine, Ser: 0.82 mg/dL (ref 0.61–1.24)
GFR calc Af Amer: >60 mL/min (ref >60)
GLUCOSE: 117 mg/dL — AB (ref 65–99)
Potassium: 3.6 mmol/L (ref 3.5–5.1)
Sodium: 142 mmol/L (ref 135–145)

## 2015-06-27 LAB — CBC WITH DIFFERENTIAL/PLATELET
BASOS ABS: 0 10*3/uL (ref 0.0–0.1)
Basophils Relative: 0 %
EOS PCT: 21 %
Eosinophils Absolute: 1.5 10*3/uL — ABNORMAL HIGH (ref 0.0–0.7)
HEMATOCRIT: 38.5 % — AB (ref 39.0–52.0)
Hemoglobin: 12.6 g/dL — ABNORMAL LOW (ref 13.0–17.0)
LYMPHS ABS: 1.9 10*3/uL (ref 0.7–4.0)
LYMPHS PCT: 27 %
MCH: 29.1 pg (ref 26.0–34.0)
MCHC: 32.7 g/dL (ref 30.0–36.0)
MCV: 88.9 fL (ref 78.0–100.0)
MONO ABS: 0.6 10*3/uL (ref 0.1–1.0)
Monocytes Relative: 8 %
NEUTROS ABS: 3.2 10*3/uL (ref 1.7–7.7)
Neutrophils Relative %: 44 %
PLATELETS: 291 10*3/uL (ref 150–400)
RBC: 4.33 MIL/uL (ref 4.22–5.81)
RDW: 13.8 % (ref 11.5–15.5)
WBC: 7.2 10*3/uL (ref 4.0–10.5)

## 2015-06-27 MED ORDER — PREDNISONE 20 MG PO TABS: 40.0000 mg | ORAL_TABLET | Freq: Every day | ORAL | Status: DC

## 2015-06-27 MED ORDER — IPRATROPIUM BROMIDE 0.02 % IN SOLN
1.0000 mg | Freq: Once | RESPIRATORY_TRACT | Status: AC
  Administered 2015-06-27: 1 mg via RESPIRATORY_TRACT
  Filled 2015-06-27: qty 5

## 2015-06-27 MED ORDER — ALBUTEROL SULFATE (2.5 MG/3ML) 0.083% IN NEBU: 2.5000 mg | INHALATION_SOLUTION | RESPIRATORY_TRACT | Status: DC | PRN

## 2015-06-27 MED ORDER — ALBUTEROL (5 MG/ML) CONTINUOUS INHALATION SOLN
10.0000 mg/h | INHALATION_SOLUTION | Freq: Once | RESPIRATORY_TRACT | Status: AC
  Administered 2015-06-27: 10 mg/h via RESPIRATORY_TRACT
  Filled 2015-06-27: qty 20

## 2015-06-27 MED ORDER — PREDNISONE 50 MG PO TABS
60.0000 mg | ORAL_TABLET | Freq: Once | ORAL | Status: AC
  Administered 2015-06-27: 60 mg via ORAL
  Filled 2015-06-27: qty 1

## 2015-06-27 MED ORDER — DOXYCYCLINE HYCLATE 100 MG PO TABS: 100.0000 mg | ORAL_TABLET | Freq: Two times a day (BID) | ORAL | Status: DC

## 2015-06-27 MED ORDER — ALBUTEROL SULFATE HFA 108 (90 BASE) MCG/ACT IN AERS: 2.0000 | INHALATION_SPRAY | RESPIRATORY_TRACT | Status: DC | PRN

## 2015-06-27 NOTE — Discharge Instructions (Signed)
°Emergency Department Resource Guide °1) Find a Doctor and Pay Out of Pocket °Although you won't have to find out who is covered by your insurance plan, it is a good idea to ask around and get recommendations. You will then need to call the office and see if the doctor you have chosen will accept you as a new patient and what types of options they offer for patients who are self-pay. Some doctors offer discounts or will set up payment plans for their patients who do not have insurance, but you will need to ask so you aren't surprised when you get to your appointment. ° °2) Contact Your Local Health Department °Not all health departments have doctors that can see patients for sick visits, but many do, so it is worth a call to see if yours does. If you don't know where your local health department is, you can check in your phone book. The CDC also has a tool to help you locate your state's health department, and many state websites also have listings of all of their local health departments. ° °3) Find a Walk-in Clinic °If your illness is not likely to be very severe or complicated, you may want to try a walk in clinic. These are popping up all over the country in pharmacies, drugstores, and shopping centers. They're usually staffed by nurse practitioners or physician assistants that have been trained to treat common illnesses and complaints. They're usually fairly quick and inexpensive. However, if you have serious medical issues or chronic medical problems, these are probably not your best option. ° °No Primary Care Doctor: °- Call [facility] at  832-8000 - they can help you locate a primary care doctor that  accepts your insurance, provides certain services, etc. °- Physician Referral Service- 1-800-533-3463 ° °Chronic Pain Problems: °Organization         Address  Phone   Notes  °[facility]  (336) [phone] Patients need to be referred by their primary care doctor.  ° °Medication  Assistance: °Organization         Address  Phone   Notes  °[place] [facility] [address] °[place], [place] [address] (336) 641-8030 --Must be a resident of [place] °-- Must have NO insurance coverage whatsoever (no Medicaid/ Medicare, etc.) °-- The pt. MUST have a primary care doctor that directs their care regularly and follows them in the community °  °[facility]  (866) 331-1348   °[employer]  (888) 892-1162   ° °Agencies that provide inexpensive medical care: °Organization         Address  Phone   Notes  °[facility]  (336) 832-8035   ° Internal Medicine    (336) 832-7272   °[facility] [address] °[place], [place] [address] (336) 832-4777   °[facility] [address], °[place] (336) 271-4999   °[facility]    (336) 373-0678   °[facility]    (336) 272-1050   °[facility] ° [address], [place] Phone:  (336) 832-4444, Fax:  (336) 832-4440 Hours of Operation:  9 am - 6 pm, M-F.  Also accepts Medicaid/Medicare and self-pay.  °[facility] ° [address], [place] Phone: (336) [phone], Fax: (336) [phone]. Hours of Operation:  8:30 am - 5:30 pm, M-F.  Also accepts Medicaid and self-pay.  °[facility] [place] [address], [place] Phone: (336) 878-6027   °[facility] [address], [place], [place] (336)723-1848, Ext. 123 Mondays & Thursdays: 7-9 AM.  First 15 patients are seen on a first come, first serve basis. °  ° °Medicaid-accepting [place] Providers: ° °Organization         Address  Phone   Notes  °[facility] [address], [place] (336) 641-2100 Also accepts self-pay patients.  °[facility] [address], [place] ° (336) 856-9996   °[facility] [address], [place]  (336) 288-8857   °[facility] [address] [place] [address], [place] (336) 299-7000   °Veita Bland [address], [place]  ° (336) 373-1557 Only accepts [place] Access Medicaid patients after they have their name applied to their card.  ° °Self-Pay (no insurance) in [place]: ° °Organization         Address  Phone   Notes  °Sickle Cell Patients, [facility] [address], [place] (336) 832-1970   °[facility] [address], [place] (336) 832-4400   °[facility] ° [address], [place] (336) 992-4800   °[facility]/Dr. Osei-Bonsu ° [address] [place] [address], [place] or [address], [place] (336) [phone] Phone number for both [place] and [place] locations is the same.  °[facility] [address], [place] (336) 299-0000   °[facility] [address] [place] [address], [place] or [address] (336) 852-7530 °(336) 878-2260   °[facility] [address], [place] (336) 350-1642, phone; (336) 294-5005, fax Sees patients 1st and 3rd Saturday of every month.  Must not qualify for public or private insurance (i.e. Medicaid, Medicare, [place] Health Choice, Veterans' Benefits) • Household income should be no more than 200% of the poverty level •The clinic cannot treat you if you are pregnant or think you are pregnant • Sexually transmitted diseases are not treated at the clinic.  ° ° °Dental Care: °Organization         Address  Phone  Notes  °[place] [facility] [address],  (336) 641-6152 Accepts children up to age 21 who are enrolled in Medicaid or [place] Health Choice; pregnant women with a Medicaid card; and children who have applied for Medicaid or [place] Health Choice, but were declined, whose parents can pay a reduced fee at time of service.  °[place]  [facility] [place]  [address], [place] (336) 641-7733 Accepts children up to age 21 who are enrolled in Medicaid or [place] Health Choice; pregnant women with a Medicaid card; and children who have applied for Medicaid or [place] Health Choice, but were declined, whose parents can pay a reduced fee at time of service.  °[facility] ° [address],  (336) 641-4533 Patients are seen by appointment only. Walk-ins are not accepted. [facility] will see patients 18 years of age and older. °Monday - Tuesday (8am-5pm) °Most Wednesdays (8:30-5pm) °$30 per visit, cash only  °[facility] ° [address], [place] (336) 641-4533 Patients are seen by appointment only. Walk-ins are not accepted. [facility] will see patients 18 years of age and older. °One   Wednesday Evening (Monthly: Volunteer Based).  $30 per visit, cash only  °[facility]  (919) 537-3737 for adults; Children under age 4, call Graduate Pediatric Dentistry at ([phone]) [phone]. Children aged 4-14, please call ([phone]) [phone] to request a pediatric application. ° Dental services are provided in all areas of dental care including fillings, crowns and bridges, complete and partial dentures, implants, gum treatment, root canals, and extractions. Preventive care is also provided. Treatment is provided to both adults and children. °Patients are selected via a lottery and there is often a waiting list. °  °[facility] [address], °[place] ° (336) 763-8833 [email or web address] °  °[facility] [address], [place], [place] (336)723-1848, Ext. 123 Second and Fourth Thursday of each month, opens at 6:30 AM; Clinic ends at 9 AM.  Patients are seen on a first-come first-served basis, and a limited number are seen during each clinic.  ° °[facility] ° [address], [place], [place] (336) [phone]    Eligibility Requirements °You must have lived in [place], [place], or [place] counties for at least the last three months. °  You cannot be eligible for state or federal sponsored healthcare insurance, including Veterans Administration, Medicaid, or Medicare. °  You generally cannot be eligible for healthcare insurance through your employer.  °  How to apply: °Eligibility screenings are held every Tuesday and Wednesday afternoon from 1:00 pm until 4:00 pm. You do not need an appointment for the interview!  °[facility] [address], [place], [place] 336-631-2330   °[place] [facility]  336-342-8273   °[place]  336-703-3100   °[facility]  336-570-6415   ° °Behavioral Health Resources in the Community: °Intensive Outpatient Programs °Organization         Address  Phone  Notes  °[facility] [address], [place], [place] 336-878-6098   °[facility] [address], [place], [place] 336-832-9800   °[facility] [address], [place], [place] ° 336-882-2125   °[facility] [address],  °[place], [place] 1-800-853-5163 or 336-641-4981   °Substance Abuse Resources °Organization         Address  Phone  Notes  °[facility]  336-882-2125   °[facility]  336-784-9470   °[facility]  336-285-9073   °[facility]  336-845-3988   °Residential & Outpatient Substance Abuse Program  1-800-659-3381   °Psychological Services °Organization         Address  Phone  Notes  ° Health  336- 832-9600   °[facility]  336- 378-7881   °[facility] [address], [place] 1-800-853-5163 or 336-641-4981   ° °Mobile Crisis Teams °Organization         Address  Phone  Notes  °[facility], Mobile Crisis Care Unit  1-877-626-1772   °Assertive °[facility] ° [address]  [place], [place] 336-834-9664   °Sharon DeEsch [address] °[place] 336-554-5454   ° °Self-Help/Support Groups °Organization         Address  Phone             Notes  °[facility] - variety of support groups  336- 373-1402 Call for more information  °Narcotics Anonymous (NA), [facility] [address], °[place]  2 meetings at this location  ° °  Residential Treatment Programs °Organization         Address  Phone  Notes  °[facility] [address],    °[place]  1-866-801-8205   °[facility] ° [address], [place], [place] 704-293-8524   °[facility] [address], [place] 336-845-3988 Admissions: 8am-3pm M-F  °[facility] [address]    °[place], [place] 336-841-1104   °[facility] [address], [place], [place] 336-379-7146   °[facility] [address]  °[place], [place] 336-285-9073   °[facility] - Intensive Outpatient [address], [place], [place] 336-852-3033   °[facility] [address]  °[place], [place] 1-877-615-2722 or 336-784-9470   °[facility] [address] [place], [place] 336-227-7417 Accepts Medicaid  °[facility] [address]  °[place] 1-800-659-3381 Substance Abuse/Addiction Treatment  ° °[place] Behavioral Health Resources °Organization         Address  Phone  Notes  °[facility]  (888) 581-9988   °Julie Brannon, PhD [address] [place], [place]   (336) 349-5553 or (336) 951-0000   °[facility]   [address] °[place], [place] (336) 349-4454   °[facility] [address], [place], [place] (336) 342-8316 Insurance/Medicaid/sponsorship through [facility]  °[facility] [address]                                    [place], [place] (336) 342-8316 Therapy/tele-psych/case    °[facility] [address]  ° [place], [place] (336) 349-2233    °Dr. Arfeen  (336) 349-4544   °[facility] [place]  [employer] [place] [facility] 1) [address], [place] °2) [address], [place] °3)  [address], [place] (336) 349-3220 °(336) 342-7768 ° °(336) 342-8140   °[place] [facility] (336) 342-1394 or (336) 342-3537 (After Hours)    ° ° ° °Take the prescriptions as directed.  Use your albuterol inhaler (2 to 4 puffs) or your albuterol nebulizer (1 unit dose) every 4 hours for the next 7 days, then as needed for cough, wheezing, or shortness of breath.  Call your regular medical doctor Monday morning to schedule a follow up appointment within the next 3 days.  Return to the Emergency Department immediately sooner if worsening.  ° °

## 2015-06-27 NOTE — ED Provider Notes (Signed)
CSN: [id number]     Arrival date & time 06/27/15  1737 History   First MD Initiated Contact with Patient 06/27/15 1746     Chief Complaint  Patient presents with  . Shortness of Breath      HPI Pt was seen at 1750.  Per pt, c/o gradual onset and worsening of persistent cough, wheezing and SOB for the past 2 weeks.  Describes her symptoms as "my COPD is acting up."  Has been using home MDI and nebs without relief.  Denies CP/palpitations, no back pain, no abd pain, no N/V/D, no fevers, no rash. The symptoms have been associated with no other complaints. The patient has a significant history of similar symptoms previously, recently being evaluated for this complaint and multiple prior evals for same.      NO PMD Past Medical History  Diagnosis Date  . Fasting hyperglycemia     Normal hemoglobin A1c of 6.0  . Hypertension   . Tobacco abuse   . Chest pain 04/2011    Normal echocardiogram  . COPD (chronic obstructive pulmonary disease) (HCC)   . Hyperlipemia   . Prediabetes 06/04/2015   Past Surgical History  Procedure Laterality Date  . Appendectomy    . Appendectomy    . Left heart catheterization with coronary angiogram N/A 05/17/2011    Procedure: LEFT HEART CATHETERIZATION WITH CORONARY ANGIOGRAM;  Surgeon: Tonny Bollman, MD;  Location: [facility] CATH LAB;  Service: Cardiovascular;  Laterality: N/A;   Family History  Problem Relation Age of Onset  . Emphysema Father    Social History  Substance Use Topics  . Smoking status: Former Smoker -- 1.00 packs/day for 35 years    Types: Cigarettes    Quit date: 05/09/2013  . Smokeless tobacco: Never Used  . Alcohol Use: No    Review of Systems ROS: Statement: All systems negative except as marked or noted in the HPI; Constitutional: Negative for fever and chills. ; ; Eyes: Negative for eye pain, redness and discharge. ; ; ENMT: Negative for ear pain, hoarseness, nasal congestion, sinus pressure and sore throat. ; ; Cardiovascular:  Negative for chest pain, palpitations, diaphoresis, and peripheral edema. ; ; Respiratory: +SOB, cough, wheezing. Negative for stridor. ; ; Gastrointestinal: Negative for nausea, vomiting, diarrhea, abdominal pain, blood in stool, hematemesis, jaundice and rectal bleeding. . ; ; Genitourinary: Negative for dysuria, flank pain and hematuria. ; ; Musculoskeletal: Negative for back pain and neck pain. Negative for swelling and trauma.; ; Skin: Negative for pruritus, rash, abrasions, blisters, bruising and skin lesion.; ; Neuro: Negative for headache, lightheadedness and neck stiffness. Negative for weakness, altered level of consciousness , altered mental status, extremity weakness, paresthesias, involuntary movement, seizure and syncope.      Allergies  Codeine  Home Medications   Prior to Admission medications   Medication Sig Start Date End Date Taking? Authorizing Provider  albuterol (PROVENTIL HFA;VENTOLIN HFA) 108 (90 BASE) MCG/ACT inhaler Inhale 2 puffs into the lungs every 6 (six) hours as needed for wheezing or shortness of breath.   Yes Historical Provider, MD  aspirin EC 81 MG tablet Take 81 mg by mouth daily.   Yes Historical Provider, MD  budesonide-formoterol (SYMBICORT) 160-4.5 MCG/ACT inhaler Inhale 2 puffs into the lungs 2 (two) times daily.   Yes Historical Provider, MD  chlorthalidone (HYGROTON) 25 MG tablet Take 25 mg by mouth daily.    Yes Historical Provider, MD  gabapentin (NEURONTIN) 300 MG capsule Take 300 mg by mouth 2 (two) times  daily.  03/22/13  Yes Historical Provider, MD  ipratropium-albuterol (DUONEB) 0.5-2.5 (3) MG/3ML SOLN Inhale 3 mLs into the lungs every 6 (six) hours as needed (for shortness of breath/wheezing).  07/17/14  Yes Historical Provider, MD  isosorbide mononitrate (IMDUR) 30 MG 24 hr tablet Take 30 mg by mouth daily. 03/22/14  Yes Historical Provider, MD  LORazepam (ATIVAN) 0.5 MG tablet Take 2 tablets (1 mg total) by mouth every 4 (four) hours as needed  for anxiety. Patient taking differently: Take 1 mg by mouth at bedtime.  03/21/15  Yes Bethann Berkshire, MD  metoprolol succinate (TOPROL-XL) 50 MG 24 hr tablet Take 50 mg by mouth every morning. Take with or immediately following a meal.   Yes Historical Provider, MD  multivitamin (ONE-A-DAY MEN'S) TABS Take 1 tablet by mouth daily.     Yes Historical Provider, MD  omeprazole (PRILOSEC) 20 MG capsule Take 20 mg by mouth daily.  05/26/15  Yes Historical Provider, MD  potassium chloride SA (K-DUR,KLOR-CON) 20 MEQ tablet Take 20 mEq by mouth daily.  05/12/14  Yes Historical Provider, MD  ranitidine (ZANTAC) 300 MG tablet Take 300 mg by mouth at bedtime.   Yes Historical Provider, MD  simvastatin (ZOCOR) 20 MG tablet Take 20 mg by mouth at bedtime.  03/22/14  Yes Historical Provider, MD  tiotropium (SPIRIVA) 18 MCG inhalation capsule Place 18 mcg into inhaler and inhale every morning.   Yes Historical Provider, MD  azithromycin (ZITHROMAX) 250 MG tablet Antibiotic to take daily for 3 more days starting Friday 06/05/15. Patient not taking: Reported on 06/09/2015 06/04/15   Elliot Cousin, MD  polyethylene glycol [facility] / Ethelene Hal) packet Take 17 g by mouth daily as needed for mild constipation.    Historical Provider, MD  predniSONE (DELTASONE) 20 MG tablet Take 1 tablet (20 mg total) by mouth daily with breakfast. Take daily until completed , starting Friday 06/05/15. Patient not taking: Reported on 06/09/2015 06/04/15   Elliot Cousin, MD   BP 135/92 mmHg  Pulse 86  Temp(Src) 98.1 F (36.7 C) (Oral)  Resp 19  Ht 5\' 7"  (1.702 m)  Wt 121 lb (54.885 kg)  BMI 18.95 kg/m2  SpO2 94% Physical Exam  1755: Physical examination:  Nursing notes reviewed; Vital signs and O2 SAT reviewed;  Constitutional: Well developed, Well nourished, Well hydrated, Uncomfortable appearing.; Head:  Normocephalic, atraumatic; Eyes: EOMI, PERRL, No scleral icterus; ENMT: Mouth and pharynx normal, Mucous membranes moist; Neck: Supple, Full  range of motion, No lymphadenopathy; Cardiovascular: Regular rate and rhythm, No gallop; Respiratory: Breath sounds diminished & equal bilaterally, faint scattered wheezes. No audible wheezing. Speaking long phrases. Sitting upright, tachypneic.; Chest: Nontender, Movement normal; Abdomen: Soft, Nontender, Nondistended, Normal bowel sounds; Genitourinary: No CVA tenderness; Extremities: Pulses normal, No tenderness, No edema, No calf edema or asymmetry.; Neuro: AA&Ox3, Major CN grossly intact.  Speech clear. No gross focal motor or sensory deficits in extremities.; Skin: Color normal, Warm, Dry.   ED Course  Procedures (including critical care time) Labs Review  Imaging Review  I have personally reviewed and evaluated these images and lab results as part of my medical decision-making.   EKG Interpretation None      MDM  MDM Reviewed: previous chart, nursing note and vitals Reviewed previous: labs Interpretation: labs and x-ray      Results for orders placed or performed during the hospital encounter of 06/27/15  Basic metabolic panel  Result Value Ref Range   Sodium 142 135 - 145 mmol/L  Potassium 3.6 3.5 - 5.1 mmol/L   Chloride 104 101 - 111 mmol/L   CO2 30 22 - 32 mmol/L   Glucose, Bld 117 (H) 65 - 99 mg/dL   BUN 6 6 - 20 mg/dL   Creatinine, Ser 4.09 0.61 - 1.24 mg/dL   Calcium 9.5 8.9 - 81.1 mg/dL   GFR calc non Af Amer >60 >60 mL/min   GFR calc Af Amer >60 >60 mL/min   Anion gap 8 5 - 15  CBC with Differential  Result Value Ref Range   WBC 7.2 4.0 - 10.5 K/uL   RBC 4.33 4.22 - 5.81 MIL/uL   Hemoglobin 12.6 (L) 13.0 - 17.0 g/dL   HCT 91.4 (L) 78.2 - 95.6 %   MCV 88.9 78.0 - 100.0 fL   MCH 29.1 26.0 - 34.0 pg   MCHC 32.7 30.0 - 36.0 g/dL   RDW 21.3 08.6 - 57.8 %   Platelets 291 150 - 400 K/uL   Neutrophils Relative % 44 %   Neutro Abs 3.2 1.7 - 7.7 K/uL   Lymphocytes Relative 27 %   Lymphs Abs 1.9 0.7 - 4.0 K/uL   Monocytes Relative 8 %   Monocytes Absolute  0.6 0.1 - 1.0 K/uL   Eosinophils Relative 21 %   Eosinophils Absolute 1.5 (H) 0.0 - 0.7 K/uL   Basophils Relative 0 %   Basophils Absolute 0.0 0.0 - 0.1 K/uL   Dg Chest Port 1 View 06/27/2015  CLINICAL DATA:  Cough, wheezing, congestion EXAM: PORTABLE CHEST 1 VIEW COMPARISON:  06/09/2015 FINDINGS: Mild hyperinflation of the lungs. Heart and mediastinal contours are within normal limits. No focal opacities or effusions. No acute bony abnormality. IMPRESSION: Hyperinflation/COPD.  No active cardiopulmonary disease. Electronically Signed   By: Charlett Nose M.D.   On: 06/27/2015 18:19    2100:  Hour long neb and steroid given. Pt states he "feels better" after neb and steroid.  NAD, lungs diminished but CTA bilat, no wheezing, resps easy, speaking full sentences, Sats 93-95% R/A.  Pt ambulated around the ED with Sats remaining 98-100% R/A, resps easy, NAD.  Pt states he wants to go home now. Pt states he "doesn't have a doctor," community physician resources given. Tx COPD exacerbation symptomatically at this time. Dx and testing d/w pt and family.  Questions answered.  Verb understanding, agreeable to d/c home with outpt f/u.    Samuel Jester, DO 06/30/15 1243

## 2015-06-27 NOTE — ED Notes (Signed)
Pt ambulated around nurses station X2 without complaints of SOB, dizziness, or chest pain. Pt remained at 98-100% on RA. Pt remains at 93% on RA at rest.

## 2015-06-27 NOTE — ED Notes (Signed)
Pt reports cough, congestion x "almost 2 weeks".  Productive cough, yellow sputum. Hx of COPD, just had a neb treatment at home.

## 2015-08-02 ENCOUNTER — Encounter (HOSPITAL_COMMUNITY): Payer: Self-pay | Admitting: Emergency Medicine

## 2015-08-02 ENCOUNTER — Emergency Department (HOSPITAL_COMMUNITY): Payer: BLUE CROSS/BLUE SHIELD

## 2015-08-02 ENCOUNTER — Emergency Department (HOSPITAL_COMMUNITY)
Admission: EM | Admit: 2015-08-02 | Discharge: 2015-08-02 | Disposition: A | Discharge status: Home / Self Care | Payer: BLUE CROSS/BLUE SHIELD | Attending: Emergency Medicine | Admitting: Emergency Medicine

## 2015-08-02 DIAGNOSIS — Z79899 Other long term (current) drug therapy: Secondary | ICD-10-CM | POA: Insufficient documentation

## 2015-08-02 DIAGNOSIS — J441 Chronic obstructive pulmonary disease with (acute) exacerbation: Secondary | ICD-10-CM | POA: Insufficient documentation

## 2015-08-02 DIAGNOSIS — Z87891 Personal history of nicotine dependence: Secondary | ICD-10-CM | POA: Insufficient documentation

## 2015-08-02 DIAGNOSIS — E785 Hyperlipidemia, unspecified: Secondary | ICD-10-CM | POA: Insufficient documentation

## 2015-08-02 DIAGNOSIS — I10 Essential (primary) hypertension: Secondary | ICD-10-CM | POA: Insufficient documentation

## 2015-08-02 MED ORDER — IPRATROPIUM BROMIDE 0.02 % IN SOLN: 0.5000 mg | Freq: Once | RESPIRATORY_TRACT | Status: DC

## 2015-08-02 MED ORDER — PREDNISONE 50 MG PO TABS
50.0000 mg | ORAL_TABLET | Freq: Once | ORAL | Status: AC
  Administered 2015-08-02: 50 mg via ORAL
  Filled 2015-08-02: qty 1

## 2015-08-02 MED ORDER — IPRATROPIUM-ALBUTEROL 0.5-2.5 (3) MG/3ML IN SOLN
3.0000 mL | Freq: Once | RESPIRATORY_TRACT | Status: AC
  Administered 2015-08-02: 3 mL via RESPIRATORY_TRACT
  Filled 2015-08-02: qty 3

## 2015-08-02 MED ORDER — ALBUTEROL SULFATE (2.5 MG/3ML) 0.083% IN NEBU
5.0000 mg | INHALATION_SOLUTION | Freq: Once | RESPIRATORY_TRACT | Status: DC
Start: 2015-08-02 — End: 2015-08-02

## 2015-08-02 MED ORDER — ALBUTEROL SULFATE (2.5 MG/3ML) 0.083% IN NEBU
2.5000 mg | INHALATION_SOLUTION | Freq: Once | RESPIRATORY_TRACT | Status: AC
  Administered 2015-08-02: 2.5 mg via RESPIRATORY_TRACT
  Filled 2015-08-02: qty 3

## 2015-08-02 MED ORDER — PREDNISONE 20 MG PO TABS: 40.0000 mg | ORAL_TABLET | Freq: Every day | ORAL | Status: DC

## 2015-08-02 NOTE — Discharge Instructions (Signed)

## 2015-08-02 NOTE — ED Provider Notes (Signed)
CSN: [id number]     Arrival date & time 08/02/15  4696 History   First MD Initiated Contact with Patient 08/02/15 0703     Chief Complaint  Patient presents with  . Shortness of Breath      HPI Patient has had shortness of breath over the last week. History of COPD. States he has had an occasional cough that's really unchanged. States the breathing is worse with lying down. No chest pain. No nausea vomiting or diarrhea. Feels like previous COPD exacerbations. His inhalers O make him feel a little better at times.   Past Medical History  Diagnosis Date  . Fasting hyperglycemia     Normal hemoglobin A1c of 6.0  . Hypertension   . Tobacco abuse   . Chest pain 04/2011    Normal echocardiogram  . COPD (chronic obstructive pulmonary disease) (HCC)   . Hyperlipemia   . Prediabetes 06/04/2015   Past Surgical History  Procedure Laterality Date  . Appendectomy    . Appendectomy    . Left heart catheterization with coronary angiogram N/A 05/17/2011    Procedure: LEFT HEART CATHETERIZATION WITH CORONARY ANGIOGRAM;  Surgeon: Tonny Bollman, MD;  Location: [facility] CATH LAB;  Service: Cardiovascular;  Laterality: N/A;   Family History  Problem Relation Age of Onset  . Emphysema Father    Social History  Substance Use Topics  . Smoking status: Former Smoker -- 1.00 packs/day for 35 years    Types: Cigarettes    Quit date: 05/09/2013  . Smokeless tobacco: Never Used  . Alcohol Use: No    Review of Systems  Constitutional: Negative for activity change and appetite change.  Eyes: Negative for pain.  Respiratory: Positive for cough, shortness of breath and wheezing. Negative for chest tightness.   Cardiovascular: Negative for chest pain and leg swelling.  Gastrointestinal: Negative for nausea, vomiting, abdominal pain and diarrhea.  Genitourinary: Negative for flank pain.  Musculoskeletal: Negative for back pain and neck stiffness.  Skin: Negative for rash.  Neurological: Negative for  weakness, numbness and headaches.  Psychiatric/Behavioral: Negative for behavioral problems.      Allergies  Codeine  Home Medications   Prior to Admission medications   Medication Sig Start Date End Date Taking? Authorizing Provider  albuterol (PROVENTIL HFA;VENTOLIN HFA) 108 (90 BASE) MCG/ACT inhaler Inhale 2 puffs into the lungs every 6 (six) hours as needed for wheezing or shortness of breath.    Historical Provider, MD  albuterol (PROVENTIL HFA;VENTOLIN HFA) 108 (90 Base) MCG/ACT inhaler Inhale 2 puffs into the lungs every 4 (four) hours as needed for wheezing or shortness of breath. 06/27/15   Samuel Jester, DO  albuterol (PROVENTIL) (2.5 MG/3ML) 0.083% nebulizer solution Take 3 mLs (2.5 mg total) by nebulization every 4 (four) hours as needed for wheezing or shortness of breath. 06/27/15   Samuel Jester, DO  aspirin EC 81 MG tablet Take 81 mg by mouth daily.    Historical Provider, MD  azithromycin (ZITHROMAX) 250 MG tablet Antibiotic to take daily for 3 more days starting Friday 06/05/15. Patient not taking: Reported on 06/09/2015 06/04/15   Elliot Cousin, MD  budesonide-formoterol [facility]) 160-4.5 MCG/ACT inhaler Inhale 2 puffs into the lungs 2 (two) times daily.    Historical Provider, MD  chlorthalidone (HYGROTON) 25 MG tablet Take 25 mg by mouth daily.     Historical Provider, MD  doxycycline (VIBRA-TABS) 100 MG tablet Take 1 tablet (100 mg total) by mouth 2 (two) times daily. 06/27/15   Samuel Jester, DO  gabapentin (NEURONTIN) 300 MG capsule Take 300 mg by mouth 2 (two) times daily.  03/22/13   Historical Provider, MD  ipratropium-albuterol (DUONEB) 0.5-2.5 (3) MG/3ML SOLN Inhale 3 mLs into the lungs every 6 (six) hours as needed (for shortness of breath/wheezing).  07/17/14   Historical Provider, MD  isosorbide mononitrate (IMDUR) 30 MG 24 hr tablet Take 30 mg by mouth daily. 03/22/14   Historical Provider, MD  LORazepam (ATIVAN) 0.5 MG tablet Take 2 tablets (1 mg total) by  mouth every 4 (four) hours as needed for anxiety. Patient taking differently: Take 1 mg by mouth at bedtime.  03/21/15   Bethann Berkshire, MD  metoprolol succinate (TOPROL-XL) 50 MG 24 hr tablet Take 50 mg by mouth every morning. Take with or immediately following a meal.    Historical Provider, MD  multivitamin (ONE-A-DAY MEN'S) TABS Take 1 tablet by mouth daily.      Historical Provider, MD  omeprazole (PRILOSEC) 20 MG capsule Take 20 mg by mouth daily.  05/26/15   Historical Provider, MD  polyethylene glycol (MIRALAX / GLYCOLAX) packet Take 17 g by mouth daily as needed for mild constipation.    Historical Provider, MD  potassium chloride SA (K-DUR,KLOR-CON) 20 MEQ tablet Take 20 mEq by mouth daily.  05/12/14   Historical Provider, MD  predniSONE (DELTASONE) 20 MG tablet Take 2 tablets (40 mg total) by mouth daily. 08/02/15   Benjiman Core, MD  ranitidine (ZANTAC) 300 MG tablet Take 300 mg by mouth at bedtime.    Historical Provider, MD  simvastatin (ZOCOR) 20 MG tablet Take 20 mg by mouth at bedtime.  03/22/14   Historical Provider, MD  tiotropium (SPIRIVA) 18 MCG inhalation capsule Place 18 mcg into inhaler and inhale every morning.    Historical Provider, MD   BP 128/95 mmHg  Pulse 88  Temp(Src) 98.2 F (36.8 C) (Oral)  Resp 18  Ht  (1.702 m)  Wt 123 lb (55.792 kg)  BMI 19.26 kg/m2  SpO2 94% Physical Exam  Constitutional: He is oriented to person, place, and time. He appears well-developed and well-nourished.  HENT:  Head: Normocephalic and atraumatic.  Eyes: Pupils are equal, round, and reactive to light.  Neck: Normal range of motion. No JVD present.  Cardiovascular: Normal rate and regular rhythm.   Pulmonary/Chest: No respiratory distress. He has wheezes.  Diffuse wheezes and mildly prolonged expirations.  Abdominal: Soft. Bowel sounds are normal. He exhibits no distension. There is no tenderness.  Musculoskeletal: He exhibits no edema.  Neurological: He is alert and  oriented to person, place, and time. No cranial nerve deficit.  Skin: Skin is warm and dry.  Psychiatric: He has a normal mood and affect.  Nursing note and vitals reviewed.   ED Course  Procedures (including critical care time) Labs Review Labs Reviewed - No data to display  Imaging Review Dg Chest 2 View  08/02/2015  CLINICAL DATA:  Shortness of breath, chest pain. EXAM: CHEST  2 VIEW COMPARISON:  June 27, 2015. FINDINGS: The heart size and mediastinal contours are within normal limits. Both lungs are clear. No pneumothorax or pleural effusion is noted. Hyperexpansion of the lungs is noted. The visualized skeletal structures are unremarkable. IMPRESSION: No active cardiopulmonary disease. Electronically Signed   By: Lupita Raider, M.D.   On: 08/02/2015 08:26   I have personally reviewed and evaluated these images and lab results as part of my medical decision-making.   EKG Interpretation   Date/Time:  Sunday August 02 2015 07:27:47 EDT Ventricular Rate:  82 PR Interval:  159 QRS Duration: 97 QT Interval:  397 QTC Calculation: 464 R Axis:   81 Text Interpretation:  Sinus rhythm Borderline right axis deviation  Confirmed by Rubin PayorPICKERING  MD, Batu Cassin [phone] on 08/02/2015 7:41:28 AM      MDM   Final diagnoses:  COPD exacerbation (HCC)    Patient with COPD. History of same. Feels better after treatment x-ray reassuring. Will discharge home.    Benjiman CoreNathan Peggye Poon, MD 08/02/15 [phone]

## 2015-08-02 NOTE — ED Notes (Signed)
Pt complains of weakness x 1 week. Pt reports wheezing at night and feeling short of breath with some chest pain.

## 2015-08-20 ENCOUNTER — Emergency Department (HOSPITAL_COMMUNITY): Payer: BC Managed Care – PPO

## 2015-08-20 ENCOUNTER — Encounter (HOSPITAL_COMMUNITY): Payer: Self-pay | Admitting: Cardiology

## 2015-08-20 ENCOUNTER — Emergency Department (HOSPITAL_COMMUNITY)
Admission: EM | Admit: 2015-08-20 | Discharge: 2015-08-20 | Disposition: A | Discharge status: Home / Self Care | Payer: BC Managed Care – PPO | Attending: Emergency Medicine | Admitting: Emergency Medicine

## 2015-08-20 DIAGNOSIS — Z87891 Personal history of nicotine dependence: Secondary | ICD-10-CM | POA: Insufficient documentation

## 2015-08-20 DIAGNOSIS — Z79899 Other long term (current) drug therapy: Secondary | ICD-10-CM | POA: Insufficient documentation

## 2015-08-20 DIAGNOSIS — J441 Chronic obstructive pulmonary disease with (acute) exacerbation: Secondary | ICD-10-CM | POA: Insufficient documentation

## 2015-08-20 DIAGNOSIS — E785 Hyperlipidemia, unspecified: Secondary | ICD-10-CM | POA: Insufficient documentation

## 2015-08-20 DIAGNOSIS — I10 Essential (primary) hypertension: Secondary | ICD-10-CM | POA: Insufficient documentation

## 2015-08-20 DIAGNOSIS — Z7982 Long term (current) use of aspirin: Secondary | ICD-10-CM | POA: Insufficient documentation

## 2015-08-20 MED ORDER — AZITHROMYCIN 250 MG PO TABS: 250.0000 mg | ORAL_TABLET | Freq: Every day | ORAL | Status: DC

## 2015-08-20 MED ORDER — METHYLPREDNISOLONE SODIUM SUCC 125 MG IJ SOLR
125.0000 mg | Freq: Once | INTRAMUSCULAR | Status: AC
  Administered 2015-08-20: 125 mg via INTRAVENOUS
  Filled 2015-08-20: qty 2

## 2015-08-20 MED ORDER — IPRATROPIUM-ALBUTEROL 0.5-2.5 (3) MG/3ML IN SOLN
3.0000 mL | Freq: Once | RESPIRATORY_TRACT | Status: AC
  Administered 2015-08-20: 3 mL via RESPIRATORY_TRACT
  Filled 2015-08-20: qty 3

## 2015-08-20 MED ORDER — PREDNISONE 10 MG (21) PO TBPK: ORAL_TABLET | ORAL | Status: DC

## 2015-08-20 MED ORDER — ALBUTEROL SULFATE HFA 108 (90 BASE) MCG/ACT IN AERS
2.0000 | INHALATION_SPRAY | RESPIRATORY_TRACT | Status: DC
  Administered 2015-08-20: 2 via RESPIRATORY_TRACT
  Filled 2015-08-20: qty 6.7

## 2015-08-20 MED ORDER — ALBUTEROL SULFATE HFA 108 (90 BASE) MCG/ACT IN AERS: 1.0000 | INHALATION_SPRAY | Freq: Four times a day (QID) | RESPIRATORY_TRACT | Status: DC | PRN

## 2015-08-20 NOTE — ED Provider Notes (Signed)
CSN: [id number]     Arrival date & time 08/20/15  4132 History   First MD Initiated Contact with Patient 08/20/15 [phone]     Chief Complaint  Patient presents with  . Shortness of Breath     (Consider location/radiation/quality/duration/timing/severity/associated sxs/prior Treatment) Patient is a 65 y.o. male presenting with shortness of breath. The history is provided by the patient. No language interpreter was used.  Shortness of Breath Severity:  Moderate Onset quality:  Gradual Timing:  Constant Progression:  Worsening Chronicity:  New Context: not URI   Relieved by:  Nothing Worsened by:  Nothing tried Ineffective treatments:  None tried Associated symptoms: cough   Associated symptoms: no abdominal pain   Risk factors: no recent alcohol use     Past Medical History  Diagnosis Date  . Fasting hyperglycemia     Normal hemoglobin A1c of 6.0  . Hypertension   . Tobacco abuse   . Chest pain 04/2011    Normal echocardiogram  . COPD (chronic obstructive pulmonary disease) (HCC)   . Hyperlipemia   . Prediabetes 06/04/2015   Past Surgical History  Procedure Laterality Date  . Appendectomy    . Appendectomy    . Left heart catheterization with coronary angiogram N/A 05/17/2011    Procedure: LEFT HEART CATHETERIZATION WITH CORONARY ANGIOGRAM;  Surgeon: Tonny Bollman, MD;  Location: [facility] CATH LAB;  Service: Cardiovascular;  Laterality: N/A;   Family History  Problem Relation Age of Onset  . Emphysema Father    Social History  Substance Use Topics  . Smoking status: Former Smoker -- 1.00 packs/day for 35 years    Types: Cigarettes    Quit date: 05/09/2013  . Smokeless tobacco: Never Used  . Alcohol Use: No    Review of Systems  Respiratory: Positive for cough and shortness of breath.   Gastrointestinal: Negative for abdominal pain.  All other systems reviewed and are negative.     Allergies  Codeine  Home Medications   Prior to Admission medications    Medication Sig Start Date End Date Taking? Authorizing Provider  albuterol (PROVENTIL HFA;VENTOLIN HFA) 108 (90 BASE) MCG/ACT inhaler Inhale 2 puffs into the lungs every 6 (six) hours as needed for wheezing or shortness of breath.   Yes Historical Provider, MD  albuterol (PROVENTIL) (2.5 MG/3ML) 0.083% nebulizer solution Take 3 mLs (2.5 mg total) by nebulization every 4 (four) hours as needed for wheezing or shortness of breath. 06/27/15  Yes Samuel Jester, DO  aspirin EC 81 MG tablet Take 81 mg by mouth daily.   Yes Historical Provider, MD  budesonide-formoterol (SYMBICORT) 160-4.5 MCG/ACT inhaler Inhale 2 puffs into the lungs 2 (two) times daily.   Yes Historical Provider, MD  chlorthalidone (HYGROTON) 25 MG tablet Take 25 mg by mouth daily.    Yes Historical Provider, MD  gabapentin (NEURONTIN) 300 MG capsule Take 300 mg by mouth 2 (two) times daily.  03/22/13  Yes Historical Provider, MD  isosorbide mononitrate (IMDUR) 30 MG 24 hr tablet Take 30 mg by mouth daily. 03/22/14  Yes Historical Provider, MD  LORazepam (ATIVAN) 0.5 MG tablet Take 2 tablets (1 mg total) by mouth every 4 (four) hours as needed for anxiety. Patient taking differently: Take 1 mg by mouth at bedtime.  03/21/15  Yes Bethann Berkshire, MD  metoprolol succinate (TOPROL-XL) 50 MG 24 hr tablet Take 50 mg by mouth every morning. Take with or immediately following a meal.   Yes Historical Provider, MD  multivitamin (ONE-A-DAY MEN'S) TABS Take  1 tablet by mouth daily.     Yes Historical Provider, MD  omeprazole (PRILOSEC) 20 MG capsule Take 20 mg by mouth daily.  05/26/15  Yes Historical Provider, MD  polyethylene glycol (MIRALAX / GLYCOLAX) packet Take 17 g by mouth daily as needed for mild constipation.   Yes Historical Provider, MD  potassium chloride SA (K-DUR,KLOR-CON) 20 MEQ tablet Take 20 mEq by mouth daily.  05/12/14  Yes Historical Provider, MD  ranitidine (ZANTAC) 300 MG tablet Take 300 mg by mouth at bedtime.   Yes  Historical Provider, MD  simvastatin (ZOCOR) 20 MG tablet Take 20 mg by mouth at bedtime.  03/22/14  Yes Historical Provider, MD  tiotropium (SPIRIVA) 18 MCG inhalation capsule Place 18 mcg into inhaler and inhale every morning.   Yes Historical Provider, MD  azithromycin (ZITHROMAX) 250 MG tablet Antibiotic to take daily for 3 more days starting Friday 06/05/15. Patient not taking: Reported on 06/09/2015 06/04/15   Elliot Cousinenise Fisher, MD  doxycycline (VIBRA-TABS) 100 MG tablet Take 1 tablet (100 mg total) by mouth 2 (two) times daily. Patient not taking: Reported on 08/20/2015 06/27/15   Samuel JesterKathleen McManus, DO  ipratropium-albuterol (DUONEB) 0.5-2.5 (3) MG/3ML SOLN Inhale 3 mLs into the lungs every 6 (six) hours as needed (for shortness of breath/wheezing).  07/17/14   Historical Provider, MD  predniSONE (DELTASONE) 20 MG tablet Take 2 tablets (40 mg total) by mouth daily. Patient not taking: Reported on 08/20/2015 08/02/15   Benjiman CoreNathan Pickering, MD   BP 141/100 mmHg  Pulse 83  Temp(Src) 98 F (36.7 C) (Oral)  Resp 20  Ht 5\' 7"  (1.702 m)  Wt 55.792 kg  BMI 19.26 kg/m2  SpO2 100% Physical Exam  Constitutional: He is oriented to person, place, and time. He appears well-developed and well-nourished.  HENT:  Head: Normocephalic and atraumatic.  Right Ear: External ear normal.  Left Ear: External ear normal.  Nose: Nose normal.  Mouth/Throat: Oropharynx is clear and moist.  Eyes: Conjunctivae and EOM are normal. Pupils are equal, round, and reactive to light.  Neck: Normal range of motion.  Cardiovascular: Normal rate and regular rhythm.   Pulmonary/Chest: Effort normal.  Abdominal: Soft. He exhibits no distension.  Musculoskeletal: Normal range of motion.  Neurological: He is alert and oriented to person, place, and time.  Skin: Skin is warm.  Psychiatric: He has a normal mood and affect.  Nursing note and vitals reviewed.   ED Course  Procedures (including critical care time) Labs Review Labs  Reviewed - No data to display  Imaging Review Dg Chest 2 View  08/20/2015  CLINICAL DATA:  Wheezing, shortness of breath. EXAM: CHEST  2 VIEW COMPARISON:  August 02, 2015 FINDINGS: The heart size and mediastinal contours are within normal limits. Both lungs are clear. No pneumothorax or pleural effusion is noted. Hyperexpansion of the lungs is again noted. The visualized skeletal structures are unremarkable. IMPRESSION: No active cardiopulmonary disease. Electronically Signed   By: Lupita RaiderJames  Green Jr, M.D.   On: 08/20/2015 10:10   I have personally reviewed and evaluated these images and lab results as part of my medical decision-making.   EKG Interpretation None      MDM Pt given solumedrol and duoneb.   Pt feels better.  Chest xray  No sign of pneumonia.  Pt is currently without a primary care MD.     Final diagnoses:  COPD with acute exacerbation (HCC)    Pt given an albuterol inhaler here.  Prednisone dose pack.  Meds ordered this encounter  Medications  . ipratropium-albuterol (DUONEB) 0.5-2.5 (3) MG/3ML nebulizer solution 3 mL    Sig:   . methylPREDNISolone sodium succinate (SOLU-MEDROL) 125 mg/2 mL injection 125 mg    Sig:   . ipratropium-albuterol (DUONEB) 0.5-2.5 (3) MG/3ML nebulizer solution 3 mL    Sig:   . albuterol (PROVENTIL HFA;VENTOLIN HFA) 108 (90 Base) MCG/ACT inhaler 2 puff    Sig:   . predniSONE (STERAPRED UNI-PAK 21 TAB) 10 MG (21) TBPK tablet    Sig: 6,5,4,3,2,1    Dispense:  21 tablet    Refill:  0    Order Specific Question:  Supervising Provider    Answer:  MILLER, BRIAN [[id number]]  . azithromycin (ZITHROMAX) 250 MG tablet    Sig: Take 1 tablet (250 mg total) by mouth daily. Take first 2 tablets together, then 1 every day until finished.    Dispense:  6 tablet    Refill:  0    Order Specific Question:  Supervising Provider    Answer:  MILLER, BRIAN [[id number]]  . albuterol (PROVENTIL HFA;VENTOLIN HFA) 108 (90 Base) MCG/ACT inhaler    Sig: Inhale 1-2 puffs into  the lungs every 6 (six) hours as needed for wheezing or shortness of breath.    Dispense:  1 Inhaler    Refill:  0    Order Specific Question:  Supervising Provider    Answer:  Eber Hong [[id number]]  An After Visit Summary was printed and given to the patient.   Lonia Skinner [place], PA-C 08/20/15 1041  Bethann Berkshire, MD 08/21/15 [phone]

## 2015-08-20 NOTE — ED Notes (Signed)
Sob and wheezing times 1 week.

## 2015-08-20 NOTE — Discharge Instructions (Signed)
Chronic Obstructive Pulmonary Disease Chronic obstructive pulmonary disease (COPD) is a common lung condition in which airflow from the lungs is limited. COPD is a general term that can be used to describe many different lung problems that limit airflow, including both chronic bronchitis and emphysema. If you have COPD, your lung function will probably never return to normal, but there are measures you can take to improve lung function and make yourself feel better. CAUSES   Smoking (common).  Exposure to secondhand smoke.  Genetic problems.  Chronic inflammatory lung diseases or recurrent infections. SYMPTOMS  Shortness of breath, especially with physical activity.  Deep, persistent (chronic) cough with a large amount of thick mucus.  Wheezing.  Rapid breaths (tachypnea).  Gray or bluish discoloration (cyanosis) of the skin, especially in your fingers, toes, or lips.  Fatigue.  Weight loss.  Frequent infections or episodes when breathing symptoms become much worse (exacerbations).  Chest tightness. DIAGNOSIS Your health care provider will take a medical history and perform a physical examination to diagnose COPD. Additional tests for COPD may include:  Lung (pulmonary) function tests.  Chest X-ray.  CT scan.  Blood tests. TREATMENT  Treatment for COPD may include:  Inhaler and nebulizer medicines. These help manage the symptoms of COPD and make your breathing more comfortable.  Supplemental oxygen. Supplemental oxygen is only helpful if you have a low oxygen level in your blood.  Exercise and physical activity. These are beneficial for nearly all people with COPD.  Lung surgery or transplant.  Nutrition therapy to gain weight, if you are underweight.  Pulmonary rehabilitation. This may involve working with a team of health care providers and specialists, such as respiratory, occupational, and physical therapists. HOME CARE INSTRUCTIONS  Take all medicines  (inhaled or pills) as directed by your health care provider.  Avoid over-the-counter medicines or cough syrups that dry up your airway (such as antihistamines) and slow down the elimination of secretions unless instructed otherwise by your health care provider.  If you are a smoker, the most important thing that you can do is stop smoking. Continuing to smoke will cause further lung damage and breathing trouble. Ask your health care provider for help with quitting smoking. He or she can direct you to community resources or hospitals that provide support.  Avoid exposure to irritants such as smoke, chemicals, and fumes that aggravate your breathing.  Use oxygen therapy and pulmonary rehabilitation if directed by your health care provider. If you require home oxygen therapy, ask your health care provider whether you should purchase a pulse oximeter to measure your oxygen level at home.  Avoid contact with individuals who have a contagious illness.  Avoid extreme temperature and humidity changes.  Eat healthy foods. Eating smaller, more frequent meals and resting before meals may help you maintain your strength.  Stay active, but balance activity with periods of rest. Exercise and physical activity will help you maintain your ability to do things you want to do.  Preventing infection and hospitalization is very important when you have COPD. Make sure to receive all the vaccines your health care provider recommends, especially the pneumococcal and influenza vaccines. Ask your health care provider whether you need a pneumonia vaccine.  Learn and use relaxation techniques to manage stress.  Learn and use controlled breathing techniques as directed by your health care provider. Controlled breathing techniques include:  Pursed lip breathing. Start by breathing in (inhaling) through your nose for 1 second. Then, purse your lips as if you were   going to whistle and breathe out (exhale) through the  pursed lips for 2 seconds.  Diaphragmatic breathing. Start by putting one hand on your abdomen just above your waist. Inhale slowly through your nose. The hand on your abdomen should move out. Then purse your lips and exhale slowly. You should be able to feel the hand on your abdomen moving in as you exhale.  Learn and use controlled coughing to clear mucus from your lungs. Controlled coughing is a series of short, progressive coughs. The steps of controlled coughing are: 1. Lean your head slightly forward. 2. Breathe in deeply using diaphragmatic breathing. 3. Try to hold your breath for 3 seconds. 4. Keep your mouth slightly open while coughing twice. 5. Spit any mucus out into a tissue. 6. Rest and repeat the steps once or twice as needed. SEEK MEDICAL CARE IF:  You are coughing up more mucus than usual.  There is a change in the color or thickness of your mucus.  Your breathing is more labored than usual.  Your breathing is faster than usual. SEEK IMMEDIATE MEDICAL CARE IF:  You have shortness of breath while you are resting.  You have shortness of breath that prevents you from:  Being able to talk.  Performing your usual physical activities.  You have chest pain lasting longer than 5 minutes.  Your skin color is more cyanotic than usual.  You measure low oxygen saturations for longer than 5 minutes with a pulse oximeter. MAKE SURE YOU:  Understand these instructions.  Will watch your condition.  Will get help right away if you are not doing well or get worse.   This information is not intended to replace advice given to you by your health care provider. Make sure you discuss any questions you have with your health care provider.   Document Released: 01/26/2005 Document Revised: 05/09/2014 Document Reviewed: 12/13/2012 Elsevier Interactive Patient Education 2016 [employer]  

## 2016-02-13 ENCOUNTER — Emergency Department (HOSPITAL_COMMUNITY): Payer: Medicaid Other

## 2016-02-13 ENCOUNTER — Emergency Department (HOSPITAL_COMMUNITY)
Admission: EM | Admit: 2016-02-13 | Discharge: 2016-02-14 | Disposition: A | Discharge status: Home / Self Care | Payer: Medicaid Other | Attending: Emergency Medicine | Admitting: Emergency Medicine

## 2016-02-13 ENCOUNTER — Encounter (HOSPITAL_COMMUNITY): Payer: Self-pay | Admitting: *Deleted

## 2016-02-13 DIAGNOSIS — Z792 Long term (current) use of antibiotics: Secondary | ICD-10-CM | POA: Diagnosis not present

## 2016-02-13 DIAGNOSIS — Z7982 Long term (current) use of aspirin: Secondary | ICD-10-CM | POA: Diagnosis not present

## 2016-02-13 DIAGNOSIS — J441 Chronic obstructive pulmonary disease with (acute) exacerbation: Secondary | ICD-10-CM | POA: Diagnosis not present

## 2016-02-13 DIAGNOSIS — Z87891 Personal history of nicotine dependence: Secondary | ICD-10-CM | POA: Diagnosis not present

## 2016-02-13 DIAGNOSIS — Z79899 Other long term (current) drug therapy: Secondary | ICD-10-CM | POA: Insufficient documentation

## 2016-02-13 DIAGNOSIS — I10 Essential (primary) hypertension: Secondary | ICD-10-CM | POA: Diagnosis not present

## 2016-02-13 DIAGNOSIS — R0602 Shortness of breath: Secondary | ICD-10-CM | POA: Diagnosis present

## 2016-02-13 MED ORDER — ALBUTEROL (5 MG/ML) CONTINUOUS INHALATION SOLN
10.0000 mg/h | INHALATION_SOLUTION | Freq: Once | RESPIRATORY_TRACT | Status: AC
  Administered 2016-02-13: 10 mg/h via RESPIRATORY_TRACT
  Filled 2016-02-13: qty 20

## 2016-02-13 MED ORDER — IPRATROPIUM BROMIDE 0.02 % IN SOLN
0.5000 mg | Freq: Once | RESPIRATORY_TRACT | Status: AC
  Administered 2016-02-13: 0.5 mg via RESPIRATORY_TRACT
  Filled 2016-02-13: qty 2.5

## 2016-02-13 MED ORDER — METHYLPREDNISOLONE SODIUM SUCC 125 MG IJ SOLR
125.0000 mg | Freq: Once | INTRAMUSCULAR | Status: AC
  Administered 2016-02-13: 125 mg via INTRAVENOUS
  Filled 2016-02-13: qty 2

## 2016-02-13 NOTE — ED Provider Notes (Addendum)
[facility]-EMERGENCY DEPT Provider Note   CSN: [id number] Arrival date & time: 02/13/16  2057  By signing my name below, I, Jeffery [name], attest that this documentation has been prepared under the direction and in the presence of Jeffery Albe, MD . Electronically Signed: Christy [name], Scribe. 02/13/2016. 11:19 PM.  Time seen 23:10 PM  History   Chief Complaint Chief Complaint  Patient presents with  . Shortness of Breath   The history is provided by the patient and medical records. No language interpreter was used.    HPI Comments:  Jeffery [name] is a 65 y.o. male who presents to the Emergency Department complaining of SOB x 1 week and worse tonight.  He reports his symptoms are worse with exertion.  Tonight his symptoms became especially bad after he walked to the living room.  He also notes associated wheezing and "huffing and puffing."  Pt is on an inhaler and has a nebulizer.  Pt reports that his inhaler and breathing treatments offer temporary relief at home.  Pt is on 2L O2 at home.  He denies smoking and drinking and does not see a lung specialist.  He also denies fever, cough or chest tightness.    PCP Dr Jeffery [name] in [place]  Past Medical History:  Diagnosis Date  . Chest pain 04/2011   Normal echocardiogram  . COPD (chronic obstructive pulmonary disease) (HCC)   . Fasting hyperglycemia    Normal hemoglobin A1c of 6.0  . Hyperlipemia   . Hypertension   . Prediabetes 06/04/2015  . Tobacco abuse     Patient Active Problem List   Diagnosis Date Noted  . Prediabetes 06/04/2015  . Hyperglycemia, drug-induced 06/03/2015  . Essential hypertension 06/03/2015  . COPD exacerbation (HCC) 06/02/2015  . Hyperlipidemia 07/20/2011  . Fasting hyperglycemia   . Hypertension   . Tobacco abuse   . Chest pain 04/02/2011    Past Surgical History:  Procedure Laterality Date  . APPENDECTOMY    . APPENDECTOMY    . LEFT HEART CATHETERIZATION WITH CORONARY ANGIOGRAM N/A  05/17/2011   Procedure: LEFT HEART CATHETERIZATION WITH CORONARY ANGIOGRAM;  Surgeon: Tonny Bollman, MD;  Location: [facility] CATH LAB;  Service: Cardiovascular;  Laterality: N/A;       Home Medications    Prior to Admission medications   Medication Sig Start Date End Date Taking? Authorizing Provider  albuterol (PROVENTIL HFA;VENTOLIN HFA) 108 (90 Base) MCG/ACT inhaler Inhale 1-2 puffs into the lungs every 6 (six) hours as needed for wheezing or shortness of breath. 08/20/15   Elson Areas, PA-C  aspirin EC 81 MG tablet Take 81 mg by mouth daily.    Historical Provider, MD  azithromycin (ZITHROMAX) 250 MG tablet Take 1 tablet (250 mg total) by mouth daily. Take first 2 tablets together, then 1 every day until finished. 08/20/15   Elson Areas, PA-C  budesonide-formoterol [facility]) 160-4.5 MCG/ACT inhaler Inhale 2 puffs into the lungs 2 (two) times daily.    Historical Provider, MD  chlorthalidone (HYGROTON) 25 MG tablet Take 25 mg by mouth daily.     Historical Provider, MD  doxycycline (VIBRA-TABS) 100 MG tablet Take 1 tablet (100 mg total) by mouth 2 (two) times daily. Patient not taking: Reported on 08/20/2015 06/27/15   Jeffery Jester, DO  gabapentin (NEURONTIN) 300 MG capsule Take 300 mg by mouth 2 (two) times daily.  03/22/13   Historical Provider, MD  ipratropium-albuterol (DUONEB) 0.5-2.5 (3) MG/3ML SOLN Inhale 3 mLs into the lungs every 6 (six)  hours as needed (for shortness of breath/wheezing).  07/17/14   Historical Provider, MD  isosorbide mononitrate (IMDUR) 30 MG 24 hr tablet Take 30 mg by mouth daily. 03/22/14   Historical Provider, MD  LORazepam (ATIVAN) 0.5 MG tablet Take 2 tablets (1 mg total) by mouth every 4 (four) hours as needed for anxiety. Patient taking differently: Take 1 mg by mouth at bedtime.  03/21/15   Jeffery BerkshireJoseph Zammit, MD  metoprolol succinate (TOPROL-XL) 50 MG 24 hr tablet Take 50 mg by mouth every morning. Take with or immediately following a meal.    Historical  Provider, MD  multivitamin (ONE-A-DAY MEN'S) TABS Take 1 tablet by mouth daily.      Historical Provider, MD  omeprazole (PRILOSEC) 20 MG capsule Take 20 mg by mouth daily.  05/26/15   Historical Provider, MD  polyethylene glycol (MIRALAX / GLYCOLAX) packet Take 17 g by mouth daily as needed for mild constipation.    Historical Provider, MD  potassium chloride SA (K-DUR,KLOR-CON) 20 MEQ tablet Take 20 mEq by mouth daily.  05/12/14   Historical Provider, MD  predniSONE (DELTASONE) 20 MG tablet Take 3 po QD x 3d , then 2 po QD x 3d then 1 po QD x 3d 02/14/16   Jeffery AlbeIva Dejon Jungman, MD  ranitidine (ZANTAC) 300 MG tablet Take 300 mg by mouth at bedtime.    Historical Provider, MD  simvastatin (ZOCOR) 20 MG tablet Take 20 mg by mouth at bedtime.  03/22/14   Historical Provider, MD  tiotropium (SPIRIVA) 18 MCG inhalation capsule Place 18 mcg into inhaler and inhale every morning.    Historical Provider, MD    Family History Family History  Problem Relation Age of Onset  . Emphysema Father     Social History Social History  Substance Use Topics  . Smoking status: Former Smoker    Packs/day: 1.00    Years: 35.00    Types: Cigarettes    Quit date: 05/09/2013  . Smokeless tobacco: Never Used  . Alcohol use No  lives at home Lives with spouse Oxygen 2 lpm [place]   Allergies   Codeine   Review of Systems Review of Systems  Constitutional: Negative for fever.  Respiratory: Positive for shortness of breath. Negative for cough.   All other systems reviewed and are negative.    Physical Exam Updated Vital Signs BP 137/99 (BP Location: Left Arm)   Pulse 92   Temp 98.1 F (36.7 C) (Oral)   Resp (!) 28   Ht 5\' 7"  (1.702 m)   Wt 116 lb (52.6 kg)   SpO2 95%   BMI 18.17 kg/m   Physical Exam  Constitutional: He is oriented to person, place, and time.  Non-toxic appearance. He does not appear ill. No distress.  Thin elderly male  HENT:  Head: Normocephalic and atraumatic.  Right Ear: External ear  normal.  Left Ear: External ear normal.  Nose: Nose normal. No mucosal edema or rhinorrhea.  Mouth/Throat: Oropharynx is clear and moist and mucous membranes are normal. No dental abscesses or uvula swelling.  Eyes: Conjunctivae and EOM are normal. Pupils are equal, round, and reactive to light.  Neck: Normal range of motion and full passive range of motion without pain. Neck supple.  Cardiovascular: Normal rate, regular rhythm and normal heart sounds.  Exam reveals no gallop and no friction rub.   No murmur heard. Pulmonary/Chest: Tachypnea noted. He is in respiratory distress. He has decreased breath sounds. He has wheezes. He has no rhonchi. He  has no rales. He exhibits no tenderness and no crepitus.  Diminished breath sounds and tight end respiratory wheezing.  Short of breath with talking.    Abdominal: Soft. Normal appearance and bowel sounds are normal. He exhibits no distension. There is no tenderness. There is no rebound and no guarding.  Musculoskeletal: Normal range of motion. He exhibits no edema or tenderness.  Moves all extremities well.   Neurological: He is alert and oriented to person, place, and time. He has normal strength. No cranial nerve deficit.  Skin: Skin is warm, dry and intact. No rash noted. No erythema. No pallor.  Psychiatric: He has a normal mood and affect. His speech is normal and behavior is normal. His mood appears not anxious.  Nursing note and vitals reviewed.    ED Treatments / Results   DIAGNOSTIC STUDIES:  Oxygen Saturation is 95% on adequate, NML by my interpretation.      Labs Results for orders placed or performed during the hospital encounter of 02/13/16  Basic metabolic panel  Result Value Ref Range   Sodium 137 135 - 145 mmol/L   Potassium 3.4 (L) 3.5 - 5.1 mmol/L   Chloride 98 (L) 101 - 111 mmol/L   CO2 29 22 - 32 mmol/L   Glucose, Bld 109 (H) 65 - 99 mg/dL   BUN 6 6 - 20 mg/dL   Creatinine, Ser 4.09 0.61 - 1.24 mg/dL   Calcium 9.6  8.9 - 81.1 mg/dL   GFR calc non Af Amer >60 >60 mL/min   GFR calc Af Amer >60 >60 mL/min   Anion gap 10 5 - 15  CBC with Differential  Result Value Ref Range   WBC 7.6 4.0 - 10.5 K/uL   RBC 4.49 4.22 - 5.81 MIL/uL   Hemoglobin 13.4 13.0 - 17.0 g/dL   HCT 91.4 78.2 - 95.6 %   MCV 90.0 78.0 - 100.0 fL   MCH 29.8 26.0 - 34.0 pg   MCHC 33.2 30.0 - 36.0 g/dL   RDW 21.3 08.6 - 57.8 %   Platelets 384 150 - 400 K/uL   Neutrophils Relative % 57 %   Neutro Abs 4.3 1.7 - 7.7 K/uL   Lymphocytes Relative 17 %   Lymphs Abs 1.3 0.7 - 4.0 K/uL   Monocytes Relative 9 %   Monocytes Absolute 0.7 0.1 - 1.0 K/uL   Eosinophils Relative 16 %   Eosinophils Absolute 1.2 (H) 0.0 - 0.7 K/uL   Basophils Relative 1 %   Basophils Absolute 0.0 0.0 - 0.1 K/uL    Laboratory interpretation all normal except mild hypokalemia (albuterol nebulizers)    Radiology Dg Chest Port 1 View  Result Date: 02/13/2016 CLINICAL DATA:  Shortness of breath for 5 days.  History of COPD. EXAM: PORTABLE CHEST 1 VIEW COMPARISON:  01/07/2016 and prior radiographs FINDINGS: Upper limits normal heart size and COPD/ emphysema changes again noted. There is no evidence of focal airspace disease, pulmonary edema, suspicious pulmonary nodule/mass, pleural effusion, or pneumothorax. No acute bony abnormalities are identified. IMPRESSION: No evidence of acute cardiopulmonary disease. Upper limits normal heart size and COPD/ emphysema. Electronically Signed   By: Harmon Pier M.D.   On: 02/13/2016 23:53    EKG Interpretation  Date/Time:  Saturday February 13 2016 22:38:11 EDT Ventricular Rate:  83 PR Interval:    QRS Duration: 112 QT Interval:  428 QTC Calculation: 503 R Axis:   84 Text Interpretation:  Sinus rhythm Borderline intraventricular conduction delay Prolonged QT interval  No significant change since last tracing 20 Aug 2015 Confirmed by [facility]  MD-I, Martice Doty ([id number]) on 02/13/2016 11:50:08 PM        Procedures Procedures  (including critical care time)  Medications Ordered in ED Medications  albuterol (PROVENTIL,VENTOLIN) solution continuous neb (10 mg/hr Nebulization Given 02/13/16 2328)  ipratropium (ATROVENT) nebulizer solution 0.5 mg (0.5 mg Nebulization Given 02/13/16 2328)  methylPREDNISolone sodium succinate (SOLU-MEDROL) 125 mg/2 mL injection 125 mg (125 mg Intravenous Given 02/13/16 2325)     Initial Impression / Assessment and Plan / ED Course  I have reviewed the triage vital signs and the nursing notes.  Pertinent labs & imaging results that were available during my care of the patient were reviewed by me and considered in my medical decision making (see chart for details).  Clinical Course    COORDINATION OF CARE:  11:18 PM Discussed treatment plan with pt at bedside and pt agreed to plan. Patient was started on IV steroids in a continuous nebulizer.  Recheck at 1:15 AM patient states he feels much improved. On lung exam he still has diminished breath sounds however it is improved. He still has very late and expiratory wheezing however he is not aware of though wheezing. I am going have nurses emulate patient and see how he does with his portable oxygen in place.  1:50 AM nurses report patient ambulated with his oxygen at 2 L/m nasal cannula. His pulse ox was 98-99% and the patient felt fine. I'm going to discharge patient home with oral steroids.  Final Clinical Impressions(s) / ED Diagnoses   Final diagnoses:  COPD exacerbation (HCC)    New Prescriptions New Prescriptions   PREDNISONE (DELTASONE) 20 MG TABLET    Take 3 po QD x 3d , then 2 po QD x 3d then 1 po QD x 3d    Plan discharge  Jeffery Albe, MD, FACEP   I personally performed the services described in this documentation, which was scribed in my presence. The recorded information has been reviewed and considered.  Jeffery Albe, MD, Concha Pyo, MD 02/14/16 6045    Jeffery Albe, MD 02/14/16 0230

## 2016-02-13 NOTE — ED Triage Notes (Signed)
Pt states he began feeling sob on Monday. Pt states it is worse with exertion.

## 2016-02-14 LAB — CBC WITH DIFFERENTIAL/PLATELET
Basophils Absolute: 0 10*3/uL (ref 0.0–0.1)
Basophils Relative: 1 %
Eosinophils Absolute: 1.2 10*3/uL — ABNORMAL HIGH (ref 0.0–0.7)
Eosinophils Relative: 16 %
HCT: 40.4 % (ref 39.0–52.0)
Hemoglobin: 13.4 g/dL (ref 13.0–17.0)
Lymphocytes Relative: 17 %
Lymphs Abs: 1.3 10*3/uL (ref 0.7–4.0)
MCH: 29.8 pg (ref 26.0–34.0)
MCHC: 33.2 g/dL (ref 30.0–36.0)
MCV: 90 fL (ref 78.0–100.0)
Monocytes Absolute: 0.7 10*3/uL (ref 0.1–1.0)
Monocytes Relative: 9 %
Neutro Abs: 4.3 10*3/uL (ref 1.7–7.7)
Neutrophils Relative %: 57 %
Platelets: 384 10*3/uL (ref 150–400)
RBC: 4.49 MIL/uL (ref 4.22–5.81)
RDW: 13.8 % (ref 11.5–15.5)
WBC: 7.6 10*3/uL (ref 4.0–10.5)

## 2016-02-14 LAB — BASIC METABOLIC PANEL
Anion gap: 10 (ref 5–15)
BUN: 6 mg/dL (ref 6–20)
CO2: 29 mmol/L (ref 22–32)
Calcium: 9.6 mg/dL (ref 8.9–10.3)
Chloride: 98 mmol/L — ABNORMAL LOW (ref 101–111)
Creatinine, Ser: 0.68 mg/dL (ref 0.61–1.24)
GFR calc Af Amer: >60 mL/min (ref >60)
GFR calc non Af Amer: >60 mL/min (ref >60)
Glucose, Bld: 109 mg/dL — ABNORMAL HIGH (ref 65–99)
Potassium: 3.4 mmol/L — ABNORMAL LOW (ref 3.5–5.1)
Sodium: 137 mmol/L (ref 135–145)

## 2016-02-14 MED ORDER — PREDNISONE 20 MG PO TABS: ORAL_TABLET | ORAL | 0 refills | Status: DC

## 2016-02-14 NOTE — ED Notes (Signed)
Pt alert & oriented x4, stable gait. Patient given discharge instructions, paperwork & prescription(s). Patient  instructed to stop at the registration desk to finish any additional paperwork. Patient verbalized understanding. Pt left department w/ no further questions. 

## 2016-02-14 NOTE — Discharge Instructions (Signed)
Continue your nebulizers and inhalers at home. Take the prednisone as directed until gone. Return to the emergency department if you get worse such as a fever, worsening cough, struggling to breathe despite using her inhaler or your nebulizer.

## 2016-02-14 NOTE — ED Notes (Signed)
Pt ambulated around the ED. Pt O2 sats remained 99 - 100% on 2L O2. Pt says he feels like normal.

## 2016-03-04 ENCOUNTER — Encounter (HOSPITAL_COMMUNITY): Payer: Self-pay | Admitting: Emergency Medicine

## 2016-03-04 ENCOUNTER — Emergency Department (HOSPITAL_COMMUNITY)
Admission: EM | Admit: 2016-03-04 | Discharge: 2016-03-04 | Disposition: A | Discharge status: Home / Self Care | Payer: Medicaid Other | Attending: Emergency Medicine | Admitting: Emergency Medicine

## 2016-03-04 ENCOUNTER — Emergency Department (HOSPITAL_COMMUNITY): Payer: Medicaid Other

## 2016-03-04 DIAGNOSIS — Z79899 Other long term (current) drug therapy: Secondary | ICD-10-CM | POA: Insufficient documentation

## 2016-03-04 DIAGNOSIS — J441 Chronic obstructive pulmonary disease with (acute) exacerbation: Secondary | ICD-10-CM | POA: Diagnosis not present

## 2016-03-04 DIAGNOSIS — Z7982 Long term (current) use of aspirin: Secondary | ICD-10-CM | POA: Diagnosis not present

## 2016-03-04 DIAGNOSIS — I10 Essential (primary) hypertension: Secondary | ICD-10-CM | POA: Insufficient documentation

## 2016-03-04 DIAGNOSIS — Z87891 Personal history of nicotine dependence: Secondary | ICD-10-CM | POA: Diagnosis not present

## 2016-03-04 DIAGNOSIS — R0602 Shortness of breath: Secondary | ICD-10-CM | POA: Diagnosis present

## 2016-03-04 DIAGNOSIS — Z7984 Long term (current) use of oral hypoglycemic drugs: Secondary | ICD-10-CM | POA: Insufficient documentation

## 2016-03-04 LAB — CBC WITH DIFFERENTIAL/PLATELET
BASOS ABS: 0 10*3/uL (ref 0.0–0.1)
BASOS PCT: 1 %
EOS ABS: 0.8 10*3/uL — AB (ref 0.0–0.7)
Eosinophils Relative: 12 %
HCT: 39 % (ref 39.0–52.0)
Hemoglobin: 12.7 g/dL — ABNORMAL LOW (ref 13.0–17.0)
Lymphocytes Relative: 20 %
Lymphs Abs: 1.3 10*3/uL (ref 0.7–4.0)
MCH: 29.5 pg (ref 26.0–34.0)
MCHC: 32.6 g/dL (ref 30.0–36.0)
MCV: 90.5 fL (ref 78.0–100.0)
MONO ABS: 0.7 10*3/uL (ref 0.1–1.0)
MONOS PCT: 11 %
Neutro Abs: 3.8 10*3/uL (ref 1.7–7.7)
Neutrophils Relative %: 56 %
PLATELETS: 243 10*3/uL (ref 150–400)
RBC: 4.31 MIL/uL (ref 4.22–5.81)
RDW: 13.8 % (ref 11.5–15.5)
WBC: 6.7 10*3/uL (ref 4.0–10.5)

## 2016-03-04 LAB — COMPREHENSIVE METABOLIC PANEL
ALBUMIN: 3.9 g/dL (ref 3.5–5.0)
ALT: 19 U/L (ref 17–63)
ANION GAP: 7 (ref 5–15)
AST: 21 U/L (ref 15–41)
Alkaline Phosphatase: 90 U/L (ref 38–126)
BUN: 8 mg/dL (ref 6–20)
CHLORIDE: 97 mmol/L — AB (ref 101–111)
CO2: 31 mmol/L (ref 22–32)
Calcium: 9.5 mg/dL (ref 8.9–10.3)
Creatinine, Ser: 0.78 mg/dL (ref 0.61–1.24)
GFR calc Af Amer: >60 mL/min (ref >60)
GFR calc non Af Amer: >60 mL/min (ref >60)
GLUCOSE: 102 mg/dL — AB (ref 65–99)
POTASSIUM: 3.8 mmol/L (ref 3.5–5.1)
SODIUM: 135 mmol/L (ref 135–145)
TOTAL PROTEIN: 6.8 g/dL (ref 6.5–8.1)
Total Bilirubin: 0.4 mg/dL (ref 0.3–1.2)

## 2016-03-04 MED ORDER — PREDNISONE 20 MG PO TABS: ORAL_TABLET | ORAL | 0 refills | Status: DC

## 2016-03-04 MED ORDER — METHYLPREDNISOLONE SODIUM SUCC 125 MG IJ SOLR
125.0000 mg | Freq: Once | INTRAMUSCULAR | Status: AC
  Administered 2016-03-04: 125 mg via INTRAMUSCULAR
  Filled 2016-03-04: qty 2

## 2016-03-04 MED ORDER — ALBUTEROL SULFATE (2.5 MG/3ML) 0.083% IN NEBU
2.5000 mg | INHALATION_SOLUTION | Freq: Once | RESPIRATORY_TRACT | Status: AC
  Administered 2016-03-04: 2.5 mg via RESPIRATORY_TRACT
  Filled 2016-03-04: qty 3

## 2016-03-04 MED ORDER — IPRATROPIUM-ALBUTEROL 0.5-2.5 (3) MG/3ML IN SOLN
3.0000 mL | Freq: Once | RESPIRATORY_TRACT | Status: AC
  Administered 2016-03-04: 3 mL via RESPIRATORY_TRACT
  Filled 2016-03-04: qty 3

## 2016-03-04 NOTE — ED Provider Notes (Signed)
[facility]Y DEPT Provider Note   CSN: [id number] Arrival date & time: 03/04/16  1715     History   Chief Complaint Chief Complaint  Patient presents with  . Shortness of Breath    HPI Jeffery [name] is a 65 y.o. male.  Patient complains of mild shortness of breath. Patient has a history of COPD   The history is provided by the patient. No language interpreter was used.  Shortness of Breath  This is a recurrent problem. The problem occurs intermittently.The current episode started 12 to 24 hours ago. The problem has not changed since onset.Pertinent negatives include no fever, no headaches, no cough, no chest pain, no abdominal pain and no rash.    Past Medical History:  Diagnosis Date  . Chest pain 04/2011   Normal echocardiogram  . COPD (chronic obstructive pulmonary disease) (HCC)   . Fasting hyperglycemia    Normal hemoglobin A1c of 6.0  . Hyperlipemia   . Hypertension   . Prediabetes 06/04/2015  . Tobacco abuse     Patient Active Problem List   Diagnosis Date Noted  . Prediabetes 06/04/2015  . Hyperglycemia, drug-induced 06/03/2015  . Essential hypertension 06/03/2015  . COPD exacerbation (HCC) 06/02/2015  . Hyperlipidemia 07/20/2011  . Fasting hyperglycemia   . Hypertension   . Tobacco abuse   . Chest pain 04/02/2011    Past Surgical History:  Procedure Laterality Date  . APPENDECTOMY    . APPENDECTOMY    . LEFT HEART CATHETERIZATION WITH CORONARY ANGIOGRAM N/A 05/17/2011   Procedure: LEFT HEART CATHETERIZATION WITH CORONARY ANGIOGRAM;  Surgeon: Tonny BollmanMichael Cooper, MD;  [facility];  Service: Cardiovascular;  Laterality: N/A;       Home Medications    Prior to Admission medications   Medication Sig Start Date End Date Taking? Authorizing Provider  albuterol (PROVENTIL HFA;VENTOLIN HFA) 108 (90 Base) MCG/ACT inhaler Inhale 1-2 puffs into the lungs every 6 (six) hours as needed for wheezing or shortness of breath. 08/20/15  Yes Lonia SkinnerLeslie K  Sofia, PA-C  ALPRAZolam Prudy Feeler(XANAX) 0.5 MG tablet Take 0.5 mg by mouth at bedtime.   Yes Historical Provider, MD  aspirin EC 81 MG tablet Take 81 mg by mouth daily.   Yes Historical Provider, MD  budesonide-formoterol (SYMBICORT) 160-4.5 MCG/ACT inhaler Inhale 2 puffs into the lungs 2 (two) times daily.   Yes Historical Provider, MD  chlorthalidone (HYGROTON) 25 MG tablet Take 12.5 mg by mouth daily.    Yes Historical Provider, MD  gabapentin (NEURONTIN) 300 MG capsule Take 300 mg by mouth 2 (two) times daily.  03/22/13  Yes Historical Provider, MD  ipratropium-albuterol (DUONEB) 0.5-2.5 (3) MG/3ML SOLN Inhale 3 mLs into the lungs every 6 (six) hours as needed (for shortness of breath/wheezing).  07/17/14  Yes Historical Provider, MD  isosorbide mononitrate (IMDUR) 30 MG 24 hr tablet Take 30 mg by mouth daily. 03/22/14  Yes Historical Provider, MD  metFORMIN (GLUCOPHAGE) 500 MG tablet Take 500 mg by mouth daily.   Yes Historical Provider, MD  metoprolol succinate (TOPROL-XL) 50 MG 24 hr tablet Take 50 mg by mouth every morning. Take with or immediately following a meal.   Yes Historical Provider, MD  multivitamin (ONE-A-DAY MEN'S) TABS Take 1 tablet by mouth daily.     Yes Historical Provider, MD  omeprazole (PRILOSEC) 20 MG capsule Take 20 mg by mouth daily.  05/26/15  Yes Historical Provider, MD  OXYGEN Inhale 2 L into the lungs continuous.   Yes Historical Provider, MD  polyethylene glycol (MIRALAX / GLYCOLAX) packet Take 17 g by mouth daily as needed for mild constipation.   Yes Historical Provider, MD  potassium chloride SA (K-DUR,KLOR-CON) 20 MEQ tablet Take 20 mEq by mouth daily.  05/12/14  Yes Historical Provider, MD  ranitidine (ZANTAC) 300 MG tablet Take 300 mg by mouth at bedtime.   Yes Historical Provider, MD  simvastatin (ZOCOR) 20 MG tablet Take 20 mg by mouth at bedtime.  03/22/14  Yes Historical Provider, MD  tiotropium (SPIRIVA HANDIHALER) 18 MCG inhalation capsule Place 18 mcg into  inhaler and inhale daily.   Yes Historical Provider, MD  predniSONE (DELTASONE) 20 MG tablet 2 tabs po daily x 3 days 03/04/16   Bethann BerkshireJoseph Arlynn Mcdermid, MD    Family History Family History  Problem Relation Age of Onset  . Emphysema Father     Social History Social History  Substance Use Topics  . Smoking status: Former Smoker    Packs/day: 1.00    Years: 35.00    Types: Cigarettes    Quit date: 05/09/2013  . Smokeless tobacco: Never Used  . Alcohol use No     Allergies   Codeine   Review of Systems Review of Systems  Constitutional: Negative for appetite change, fatigue and fever.  HENT: Negative for congestion, ear discharge and sinus pressure.   Eyes: Negative for discharge.  Respiratory: Positive for shortness of breath. Negative for cough.   Cardiovascular: Negative for chest pain.  Gastrointestinal: Negative for abdominal pain and diarrhea.  Genitourinary: Negative for frequency and hematuria.  Musculoskeletal: Negative for back pain.  Skin: Negative for rash.  Neurological: Negative for seizures and headaches.  Psychiatric/Behavioral: Negative for hallucinations.     Physical Exam Updated Vital Signs BP 126/81 (BP Location: Left Arm)   Pulse 89   Temp 97.5 F (36.4 C) (Oral)   Resp 15   Ht 5\' 7"  (1.702 m)   Wt 116 lb (52.6 kg)   SpO2 100%   BMI 18.17 kg/m   Physical Exam  Constitutional: He is oriented to person, place, and time. He appears well-developed.  HENT:  Head: Normocephalic.  Eyes: Conjunctivae and EOM are normal. No scleral icterus.  Neck: Neck supple. No thyromegaly present.  Cardiovascular: Normal rate and regular rhythm.  Exam reveals no gallop and no friction rub.   No murmur heard. Pulmonary/Chest: No stridor. He has wheezes. He has no rales. He exhibits no tenderness.  Abdominal: He exhibits no distension. There is no tenderness. There is no rebound.  Musculoskeletal: Normal range of motion. He exhibits no edema.  Lymphadenopathy:    He  has no cervical adenopathy.  Neurological: He is oriented to person, place, and time. He exhibits normal muscle tone. Coordination normal.  Skin: No rash noted. No erythema.  Psychiatric: He has a normal mood and affect. His behavior is normal.     ED Treatments / Results  Labs (all labs ordered are listed, but only abnormal results are displayed) Labs Reviewed  CBC WITH DIFFERENTIAL/PLATELET - Abnormal; Notable for the following:       Result Value   Hemoglobin 12.7 (*)    Eosinophils Absolute 0.8 (*)    All other components within normal limits  COMPREHENSIVE METABOLIC PANEL - Abnormal; Notable for the following:    Chloride 97 (*)    Glucose, Bld 102 (*)    All other components within normal limits    EKG  EKG Interpretation None       Radiology Dg Chest 2  View  Result Date: 03/04/2016 CLINICAL DATA:  Increased shortness of Breath EXAM: CHEST  2 VIEW COMPARISON:  02/13/2016 FINDINGS: The heart size and mediastinal contours are within normal limits. Both lungs are hyperinflated. The visualized skeletal structures are unremarkable. IMPRESSION: COPD without acute abnormality. Electronically Signed   By: Alcide Clever M.D.   On: 03/04/2016 19:01    Procedures Procedures (including critical care time)  Medications Ordered in ED Medications  methylPREDNISolone sodium succinate (SOLU-MEDROL) 125 mg/2 mL injection 125 mg (125 mg Intramuscular Given 03/04/16 1806)  ipratropium-albuterol (DUONEB) 0.5-2.5 (3) MG/3ML nebulizer solution 3 mL (3 mLs Nebulization Given 03/04/16 1812)  albuterol (PROVENTIL) (2.5 MG/3ML) 0.083% nebulizer solution 2.5 mg (2.5 mg Nebulization Given 03/04/16 1812)     Initial Impression / Assessment and Plan / ED Course  I have reviewed the triage vital signs and the nursing notes.  Pertinent labs & imaging results that were available during my care of the patient were reviewed by me and considered in my medical decision making (see chart for  details).  Clinical Course    Patient with exacerbation of COPD. Patient improved with neb treatment and was given some steroids. Patient will be sent home with a short course of steroids and will follow-up with his PCP as needed  Final Clinical Impressions(s) / ED Diagnoses   Final diagnoses:  COPD exacerbation (HCC)    New Prescriptions New Prescriptions   PREDNISONE (DELTASONE) 20 MG TABLET    2 tabs po daily x 3 days     Bethann Berkshire, MD 03/04/16 2053

## 2016-03-04 NOTE — Discharge Instructions (Signed)
Follow up with your md next week. °

## 2016-03-04 NOTE — ED Triage Notes (Signed)
Pt walks in on oxygen, complaining of SOB, vitals are stable

## 2016-03-04 NOTE — ED Triage Notes (Addendum)
Wrong chart

## 2016-03-10 ENCOUNTER — Emergency Department (HOSPITAL_COMMUNITY): Payer: Medicaid Other

## 2016-03-10 ENCOUNTER — Encounter (HOSPITAL_COMMUNITY): Payer: Self-pay

## 2016-03-10 ENCOUNTER — Emergency Department (HOSPITAL_COMMUNITY)
Admission: EM | Admit: 2016-03-10 | Discharge: 2016-03-10 | Disposition: A | Discharge status: Home / Self Care | Payer: Medicaid Other | Attending: Emergency Medicine | Admitting: Emergency Medicine

## 2016-03-10 DIAGNOSIS — J441 Chronic obstructive pulmonary disease with (acute) exacerbation: Secondary | ICD-10-CM | POA: Insufficient documentation

## 2016-03-10 DIAGNOSIS — Z7984 Long term (current) use of oral hypoglycemic drugs: Secondary | ICD-10-CM | POA: Insufficient documentation

## 2016-03-10 DIAGNOSIS — R0602 Shortness of breath: Secondary | ICD-10-CM | POA: Diagnosis present

## 2016-03-10 DIAGNOSIS — Z87891 Personal history of nicotine dependence: Secondary | ICD-10-CM | POA: Diagnosis not present

## 2016-03-10 DIAGNOSIS — Z7982 Long term (current) use of aspirin: Secondary | ICD-10-CM | POA: Diagnosis not present

## 2016-03-10 DIAGNOSIS — Z79899 Other long term (current) drug therapy: Secondary | ICD-10-CM | POA: Diagnosis not present

## 2016-03-10 DIAGNOSIS — I10 Essential (primary) hypertension: Secondary | ICD-10-CM | POA: Insufficient documentation

## 2016-03-10 HISTORY — DX: Gastro-esophageal reflux disease without esophagitis: K21.9

## 2016-03-10 HISTORY — DX: Dependence on supplemental oxygen: Z99.81

## 2016-03-10 LAB — BASIC METABOLIC PANEL
ANION GAP: 7 (ref 5–15)
BUN: 5 mg/dL — AB (ref 6–20)
CO2: 34 mmol/L — ABNORMAL HIGH (ref 22–32)
Calcium: 9.4 mg/dL (ref 8.9–10.3)
Chloride: 94 mmol/L — ABNORMAL LOW (ref 101–111)
Creatinine, Ser: 0.75 mg/dL (ref 0.61–1.24)
GFR calc Af Amer: >60 mL/min (ref >60)
Glucose, Bld: 109 mg/dL — ABNORMAL HIGH (ref 65–99)
POTASSIUM: 3.8 mmol/L (ref 3.5–5.1)
SODIUM: 135 mmol/L (ref 135–145)

## 2016-03-10 LAB — CBC WITH DIFFERENTIAL/PLATELET
BASOS ABS: 0 10*3/uL (ref 0.0–0.1)
BASOS PCT: 0 %
Eosinophils Absolute: 1.4 10*3/uL — ABNORMAL HIGH (ref 0.0–0.7)
Eosinophils Relative: 18 %
HEMATOCRIT: 41.9 % (ref 39.0–52.0)
Hemoglobin: 13.8 g/dL (ref 13.0–17.0)
Lymphocytes Relative: 22 %
Lymphs Abs: 1.8 10*3/uL (ref 0.7–4.0)
MCH: 29.6 pg (ref 26.0–34.0)
MCHC: 32.9 g/dL (ref 30.0–36.0)
MCV: 89.7 fL (ref 78.0–100.0)
MONO ABS: 0.5 10*3/uL (ref 0.1–1.0)
Monocytes Relative: 6 %
NEUTROS ABS: 4.4 10*3/uL (ref 1.7–7.7)
Neutrophils Relative %: 54 %
PLATELETS: 298 10*3/uL (ref 150–400)
RBC: 4.67 MIL/uL (ref 4.22–5.81)
RDW: 13.8 % (ref 11.5–15.5)
WBC: 8.1 10*3/uL (ref 4.0–10.5)

## 2016-03-10 LAB — TROPONIN I

## 2016-03-10 MED ORDER — ALBUTEROL SULFATE (2.5 MG/3ML) 0.083% IN NEBU: 2.5000 mg | INHALATION_SOLUTION | RESPIRATORY_TRACT | 0 refills | Status: DC | PRN

## 2016-03-10 MED ORDER — ALBUTEROL SULFATE HFA 108 (90 BASE) MCG/ACT IN AERS: 2.0000 | INHALATION_SPRAY | RESPIRATORY_TRACT | 0 refills | Status: DC | PRN

## 2016-03-10 MED ORDER — IPRATROPIUM BROMIDE 0.02 % IN SOLN
1.0000 mg | Freq: Once | RESPIRATORY_TRACT | Status: AC
  Administered 2016-03-10: 1 mg via RESPIRATORY_TRACT
  Filled 2016-03-10: qty 5

## 2016-03-10 MED ORDER — PREDNISONE 20 MG PO TABS: 40.0000 mg | ORAL_TABLET | Freq: Every day | ORAL | 0 refills | Status: DC

## 2016-03-10 MED ORDER — DOXYCYCLINE HYCLATE 100 MG PO TABS: 100.0000 mg | ORAL_TABLET | Freq: Two times a day (BID) | ORAL | 0 refills | Status: DC

## 2016-03-10 MED ORDER — ALBUTEROL (5 MG/ML) CONTINUOUS INHALATION SOLN
10.0000 mg/h | INHALATION_SOLUTION | Freq: Once | RESPIRATORY_TRACT | Status: AC
  Administered 2016-03-10: 10 mg/h via RESPIRATORY_TRACT
  Filled 2016-03-10: qty 20

## 2016-03-10 MED ORDER — PREDNISONE 50 MG PO TABS
60.0000 mg | ORAL_TABLET | Freq: Once | ORAL | Status: AC
  Administered 2016-03-10: 60 mg via ORAL
  Filled 2016-03-10: qty 1

## 2016-03-10 NOTE — ED Notes (Signed)
Checked on pt he is rec a breathing treatment

## 2016-03-10 NOTE — ED Triage Notes (Signed)
Pt reports SOB since [place].  Reports was here 2 weeks ago for same.  Pt says is having " a little bit of abd pain."  Pt alert, oriented, pleasant in triage.

## 2016-03-10 NOTE — Discharge Instructions (Signed)
Take the prescriptions as directed.  Use your albuterol inhaler (2 to 4 puffs) or your albuterol nebulizer (1 unit dose) every 4 hours for the next 7 days, then as needed for cough, wheezing, or shortness of breath.  Call your regular medical doctor tomorrow morning to schedule a follow up appointment within the next 2 days.  Return to the Emergency Department immediately sooner if worsening.  ° °

## 2016-03-10 NOTE — ED Provider Notes (Signed)
[facility] DE[facility] Provider Note   CSN: [id number] Arrival date & time: 03/10/16  1729     History   Chief Complaint Chief Complaint  Patient presents with  . Shortness of Breath    HPI Jeffery [name] is a 65 y.o. male.   Shortness of Breath   Pt was seen at 1925.  Per pt, c/o gradual onset and worsening of persistent cough, wheezing and SOB for the past 2 days.  Describes his symptoms as "my COPD is acting up."  Has been using home O2, MDI and nebs with transient relief.  Denies CP/palpitations, no back pain, no abd pain, no N/V/D, no fevers, no rash. The symptoms have been associated with no other complaints. The patient has a significant history of similar symptoms previously, recently being evaluated for this complaint and multiple prior evals for same.  Pt states he has not been f/u with his PMD in between his ED visits.     Past Medical History:  Diagnosis Date  . Chest pain 04/2011   Normal echocardiogram  . COPD (chronic obstructive pulmonary disease) (HCC)   . Fasting hyperglycemia    Normal hemoglobin A1c of 6.0  . GERD (gastroesophageal reflux disease)   . Hyperlipemia   . Hypertension   . On home O2    2L N/C  . Prediabetes 06/04/2015  . Tobacco abuse     Patient Active Problem List   Diagnosis Date Noted  . Prediabetes 06/04/2015  . Hyperglycemia, drug-induced 06/03/2015  . Essential hypertension 06/03/2015  . COPD exacerbation (HCC) 06/02/2015  . Hyperlipidemia 07/20/2011  . Fasting hyperglycemia   . Hypertension   . Tobacco abuse   . Chest pain 04/02/2011    Past Surgical History:  Procedure Laterality Date  . APPENDECTOMY    . APPENDECTOMY    . LEFT HEART CATHETERIZATION WITH CORONARY ANGIOGRAM N/A 05/17/2011   Procedure: LEFT HEART CATHETERIZATION WITH CORONARY ANGIOGRAM;  Surgeon: Tonny Bollman, MD;  Location: [facility] CATH LAB;  Service: Cardiovascular;  Laterality: N/A;       Home Medications    Prior to Admission medications     Medication Sig Start Date End Date Taking? Authorizing Provider  albuterol (PROVENTIL HFA;VENTOLIN HFA) 108 (90 Base) MCG/ACT inhaler Inhale 1-2 puffs into the lungs every 6 (six) hours as needed for wheezing or shortness of breath. 08/20/15  Yes Lonia Skinner Sofia, PA-C  ALPRAZolam Prudy Feeler) 0.5 MG tablet Take 0.5 mg by mouth at bedtime.   Yes Historical Provider, MD  aspirin EC 81 MG tablet Take 81 mg by mouth daily.   Yes Historical Provider, MD  budesonide-formoterol (SYMBICORT) 160-4.5 MCG/ACT inhaler Inhale 2 puffs into the lungs 2 (two) times daily.   Yes Historical Provider, MD  chlorthalidone (HYGROTON) 25 MG tablet Take 12.5 mg by mouth daily.    Yes Historical Provider, MD  gabapentin (NEURONTIN) 300 MG capsule Take 300 mg by mouth 2 (two) times daily.  03/22/13  Yes Historical Provider, MD  ipratropium-albuterol (DUONEB) 0.5-2.5 (3) MG/3ML SOLN Inhale 3 mLs into the lungs every 6 (six) hours as needed (for shortness of breath/wheezing).  07/17/14  Yes Historical Provider, MD  isosorbide mononitrate (IMDUR) 30 MG 24 hr tablet Take 30 mg by mouth daily. 03/22/14  Yes Historical Provider, MD  metFORMIN (GLUCOPHAGE) 500 MG tablet Take 500 mg by mouth daily.   Yes Historical Provider, MD  metoprolol succinate (TOPROL-XL) 50 MG 24 hr tablet Take 50 mg by mouth every morning. Take with or immediately following a  meal.   Yes Historical Provider, MD  multivitamin (ONE-A-DAY MEN'S) TABS Take 1 tablet by mouth daily.     Yes Historical Provider, MD  omeprazole (PRILOSEC) 20 MG capsule Take 20 mg by mouth daily.  05/26/15  Yes Historical Provider, MD  OXYGEN Inhale 2 L into the lungs continuous.   Yes Historical Provider, MD  polyethylene glycol (MIRALAX / GLYCOLAX) packet Take 17 g by mouth daily as needed for mild constipation.   Yes Historical Provider, MD  potassium chloride SA (K-DUR,KLOR-CON) 20 MEQ tablet Take 20 mEq by mouth daily.  05/12/14  Yes Historical Provider, MD  ranitidine (ZANTAC) 300 MG  tablet Take 300 mg by mouth at bedtime.   Yes Historical Provider, MD  simvastatin (ZOCOR) 20 MG tablet Take 20 mg by mouth at bedtime.  03/22/14  Yes Historical Provider, MD  predniSONE (DELTASONE) 20 MG tablet 2 tabs po daily x 3 days Patient not taking: Reported on 03/10/2016 03/04/16   Bethann Berkshire, MD  tiotropium (SPIRIVA HANDIHALER) 18 MCG inhalation capsule Place 18 mcg into inhaler and inhale daily.    Historical Provider, MD    Family History Family History  Problem Relation Age of Onset  . Emphysema Father     Social History Social History  Substance Use Topics  . Smoking status: Former Smoker    Packs/day: 1.00    Years: 35.00    Types: Cigarettes    Quit date: 05/09/2013  . Smokeless tobacco: Never Used  . Alcohol use No     Allergies   Codeine   Review of Systems Review of Systems  Respiratory: Positive for shortness of breath.   ROS: Statement: All systems negative except as marked or noted in the HPI; Constitutional: Negative for fever and chills. ; ; Eyes: Negative for eye pain, redness and discharge. ; ; ENMT: Negative for ear pain, hoarseness, nasal congestion, sinus pressure and sore throat. ; ; Cardiovascular: Negative for chest pain, palpitations, diaphoresis, and peripheral edema. ; ; Respiratory: +cough, wheezing. SOB. Negative for stridor. ; ; Gastrointestinal: Negative for nausea, vomiting, diarrhea, abdominal pain, blood in stool, hematemesis, jaundice and rectal bleeding. . ; ; Genitourinary: Negative for dysuria, flank pain and hematuria. ; ; Musculoskeletal: Negative for back pain and neck pain. Negative for swelling and trauma.; ; Skin: Negative for pruritus, rash, abrasions, blisters, bruising and skin lesion.; ; Neuro: Negative for headache, lightheadedness and neck stiffness. Negative for weakness, altered level of consciousness, altered mental status, extremity weakness, paresthesias, involuntary movement, seizure and syncope.        Physical  Exam Updated Vital Signs BP 122/83   Pulse 92   Temp 97.7 F (36.5 C) (Temporal)   Resp 18   Ht 5\' 7"  (1.702 m)   Wt 119 lb (54 kg)   SpO2 97%   BMI 18.64 kg/m   Physical Exam 1930: Physical examination:  Nursing notes reviewed; Vital signs and O2 SAT reviewed;  Constitutional: Well developed, Well nourished, Well hydrated, In no acute distress; Head:  Normocephalic, atraumatic; Eyes: EOMI, PERRL, No scleral icterus; ENMT: Mouth and pharynx normal, Mucous membranes moist; Neck: Supple, Full range of motion, No lymphadenopathy; Cardiovascular: Regular rate and rhythm, No gallop; Respiratory: Breath sounds diminished & equal bilaterally, scatted wheezes. No audible wheezing. Speaking full sentences with ease, Normal respiratory effort/excursion; Chest: Nontender, Movement normal; Abdomen: Soft, Nontender, Nondistended, Normal bowel sounds; Genitourinary: No CVA tenderness; Extremities: Pulses normal, No tenderness, No edema, No calf edema or asymmetry.; Neuro: AA&Ox3, Major CN grossly  intact.  Speech clear. No gross focal motor or sensory deficits in extremities.; Skin: Color normal, Warm, Dry.   ED Treatments / Results  Labs (all labs ordered are listed, but only abnormal results are displayed)   EKG  EKG Interpretation  Date/Time:  Thursday March 10 2016 17:52:29 EST Ventricular Rate:  81 PR Interval:  148 QRS Duration: 88 QT Interval:  344 QTC Calculation: 399 R Axis:   80 Text Interpretation:  Normal sinus rhythm Biatrial enlargement T wave abnormality, consider lateral ischemia When compared with ECG of 04/11/2011 No significant change was found Confirmed by [facility]  MD, Nicholos JohnsKATHLEEN [phone]([id number]) on 03/10/2016 7:32:30 PM       Radiology   Procedures Procedures (including critical care time)  Medications Ordered in ED Medications  predniSONE (DELTASONE) tablet 60 mg (60 mg Oral Given 03/10/16 2001)  albuterol (PROVENTIL,VENTOLIN) solution continuous neb (10 mg/hr Nebulization  Given 03/10/16 1946)  ipratropium (ATROVENT) nebulizer solution 1 mg (1 mg Nebulization Given 03/10/16 1946)     Initial Impression / Assessment and Plan / ED Course  I have reviewed the triage vital signs and the nursing notes.  Pertinent labs & imaging results that were available during my care of the patient were reviewed by me and considered in my medical decision making (see chart for details).  MDM Reviewed: previous chart, nursing note and vitals Reviewed previous: labs and ECG Interpretation: labs, ECG and x-ray   Results for orders placed or performed during the hospital encounter of 03/10/16  Basic metabolic panel  Result Value Ref Range   Sodium 135 135 - 145 mmol/L   Potassium 3.8 3.5 - 5.1 mmol/L   Chloride 94 (L) 101 - 111 mmol/L   CO2 34 (H) 22 - 32 mmol/L   Glucose, Bld 109 (H) 65 - 99 mg/dL   BUN 5 (L) 6 - 20 mg/dL   Creatinine, Ser 1.910.75 0.61 - 1.24 mg/dL   Calcium 9.4 8.9 - 47.810.3 mg/dL   GFR calc non Af Amer >60 >60 mL/min   GFR calc Af Amer >60 >60 mL/min   Anion gap 7 5 - 15  Troponin I  Result Value Ref Range   Troponin I <0.03 <0.03 ng/mL  CBC with Differential  Result Value Ref Range   WBC 8.1 4.0 - 10.5 K/uL   RBC 4.67 4.22 - 5.81 MIL/uL   Hemoglobin 13.8 13.0 - 17.0 g/dL   HCT 29.541.9 62.139.0 - 30.852.0 %   MCV 89.7 78.0 - 100.0 fL   MCH 29.6 26.0 - 34.0 pg   MCHC 32.9 30.0 - 36.0 g/dL   RDW 65.713.8 84.611.5 - 96.215.5 %   Platelets 298 150 - 400 K/uL   Neutrophils Relative % 54 %   Neutro Abs 4.4 1.7 - 7.7 K/uL   Lymphocytes Relative 22 %   Lymphs Abs 1.8 0.7 - 4.0 K/uL   Monocytes Relative 6 %   Monocytes Absolute 0.5 0.1 - 1.0 K/uL   Eosinophils Relative 18 %   Eosinophils Absolute 1.4 (H) 0.0 - 0.7 K/uL   Basophils Relative 0 %   Basophils Absolute 0.0 0.0 - 0.1 K/uL   Dg Chest Port 1 View Result Date: 03/10/2016 CLINICAL DATA:  Pt reports SOB since Tuesday. Reports was here 2 weeks ago for same. Pt also c/o mild generalized ABD pain. Hx HTN, GERD, COPD,  former smoker EXAM: PORTABLE CHEST 1 VIEW COMPARISON:  03/04/2016 FINDINGS: Heart size is unremarkable. The lungs are free of focal consolidations and pleural effusions.  No pulmonary edema. Artifact overlies the left lung apex. IMPRESSION: No active disease. Electronically Signed   By: Norva PavlovElizabeth  Brown M.D.   On: 03/10/2016 20:18    2145:  Pt states he "feels better" after neb and steroid.  NAD, lungs CTA bilat, no wheezing, resps easy, speaking full sentences, Sats 97% R/A.  Pt ambulated around the ED with Sats remaining 92-93 % R/A, resps easy, NAD.  Denies SOB and wants to go home now. Pt has O2 N/C, nebs and MDI at home.  Pt strongly encouraged to f/u with his PMD for good continuity of care and control of his chronic medical condition. Pt verb understanding. Dx and testing d/w pt.  Questions answered.  Verb understanding, agreeable to d/c home with outpt f/u.    Final Clinical Impressions(s) / ED Diagnoses   Final diagnoses:  None    New Prescriptions New Prescriptions   No medications on file      Samuel JesterKathleen Shadai Mcclane, DO 03/13/16 1629

## 2016-03-10 NOTE — ED Notes (Signed)
Ambulated patient with cont pulse ox maintaining 92-93% room air.  Pt denies increased sob while walking

## 2016-04-23 ENCOUNTER — Encounter (HOSPITAL_COMMUNITY): Payer: Self-pay | Admitting: Emergency Medicine

## 2016-04-23 ENCOUNTER — Emergency Department (HOSPITAL_COMMUNITY)
Admission: EM | Admit: 2016-04-23 | Discharge: 2016-04-23 | Disposition: A | Discharge status: Home / Self Care | Payer: Medicaid Other | Attending: Emergency Medicine | Admitting: Emergency Medicine

## 2016-04-23 ENCOUNTER — Emergency Department (HOSPITAL_COMMUNITY): Payer: Medicaid Other

## 2016-04-23 DIAGNOSIS — Z79899 Other long term (current) drug therapy: Secondary | ICD-10-CM | POA: Insufficient documentation

## 2016-04-23 DIAGNOSIS — Z7984 Long term (current) use of oral hypoglycemic drugs: Secondary | ICD-10-CM | POA: Insufficient documentation

## 2016-04-23 DIAGNOSIS — Z87891 Personal history of nicotine dependence: Secondary | ICD-10-CM | POA: Diagnosis not present

## 2016-04-23 DIAGNOSIS — I10 Essential (primary) hypertension: Secondary | ICD-10-CM | POA: Insufficient documentation

## 2016-04-23 DIAGNOSIS — J449 Chronic obstructive pulmonary disease, unspecified: Secondary | ICD-10-CM | POA: Diagnosis not present

## 2016-04-23 DIAGNOSIS — J441 Chronic obstructive pulmonary disease with (acute) exacerbation: Secondary | ICD-10-CM

## 2016-04-23 DIAGNOSIS — Z7982 Long term (current) use of aspirin: Secondary | ICD-10-CM | POA: Diagnosis not present

## 2016-04-23 DIAGNOSIS — R0602 Shortness of breath: Secondary | ICD-10-CM | POA: Diagnosis present

## 2016-04-23 LAB — CBC
HCT: 39.6 % (ref 39.0–52.0)
Hemoglobin: 12.9 g/dL — ABNORMAL LOW (ref 13.0–17.0)
MCH: 29.3 pg (ref 26.0–34.0)
MCHC: 32.6 g/dL (ref 30.0–36.0)
MCV: 90 fL (ref 78.0–100.0)
PLATELETS: 263 10*3/uL (ref 150–400)
RBC: 4.4 MIL/uL (ref 4.22–5.81)
RDW: 14.2 % (ref 11.5–15.5)
WBC: 7.2 10*3/uL (ref 4.0–10.5)

## 2016-04-23 LAB — BASIC METABOLIC PANEL
Anion gap: 8 (ref 5–15)
CALCIUM: 9.6 mg/dL (ref 8.9–10.3)
CO2: 34 mmol/L — ABNORMAL HIGH (ref 22–32)
CREATININE: 0.73 mg/dL (ref 0.61–1.24)
Chloride: 97 mmol/L — ABNORMAL LOW (ref 101–111)
GFR calc Af Amer: >60 mL/min (ref >60)
Glucose, Bld: 102 mg/dL — ABNORMAL HIGH (ref 65–99)
Potassium: 3.3 mmol/L — ABNORMAL LOW (ref 3.5–5.1)
SODIUM: 139 mmol/L (ref 135–145)

## 2016-04-23 MED ORDER — PREDNISONE 20 MG PO TABS
40.0000 mg | ORAL_TABLET | Freq: Once | ORAL | Status: AC
  Administered 2016-04-23: 40 mg via ORAL
  Filled 2016-04-23: qty 2

## 2016-04-23 MED ORDER — ALBUTEROL SULFATE (2.5 MG/3ML) 0.083% IN NEBU
5.0000 mg | INHALATION_SOLUTION | Freq: Once | RESPIRATORY_TRACT | Status: AC
  Administered 2016-04-23: 5 mg via RESPIRATORY_TRACT
  Filled 2016-04-23: qty 6

## 2016-04-23 MED ORDER — PREDNISONE 20 MG PO TABS: 40.0000 mg | ORAL_TABLET | Freq: Every day | ORAL | 0 refills | Status: AC

## 2016-04-23 MED ORDER — ALBUTEROL SULFATE HFA 108 (90 BASE) MCG/ACT IN AERS: 1.0000 | INHALATION_SPRAY | Freq: Four times a day (QID) | RESPIRATORY_TRACT | 0 refills | Status: DC | PRN

## 2016-04-23 MED ORDER — AZITHROMYCIN 250 MG PO TABS: 250.0000 mg | ORAL_TABLET | Freq: Every day | ORAL | 0 refills | Status: DC

## 2016-04-23 NOTE — Discharge Instructions (Signed)
We believe that your symptoms are caused today by an exacerbation of your COPD, and possibly bronchitis.  Please take the prescribed medications and any medications that you have at home for your COPD.  Follow up with your doctor as recommended.  If you develop any new or worsening symptoms, including but not limited to fever, persistent vomiting, worsening shortness of breath, or other symptoms that concern you, please return to the Emergency Department immediately. ° ° °Chronic Obstructive Pulmonary Disease °Chronic obstructive pulmonary disease (COPD) is a common lung condition in which airflow from the lungs is limited. COPD is a general term that can be used to describe many different lung problems that limit airflow, including both chronic bronchitis and emphysema.  If you have COPD, your lung function will probably never return to normal, but there are measures you can take to improve lung function and make yourself feel better.  °CAUSES  °Smoking (common).   °Exposure to secondhand smoke.   °Genetic problems. °Chronic inflammatory lung diseases or recurrent infections. °SYMPTOMS  °Shortness of breath, especially with physical activity.   °Deep, persistent (chronic) cough with a large amount of thick mucus.   °Wheezing.   °Rapid breaths (tachypnea).   °Gray or bluish discoloration (cyanosis) of the skin, especially in fingers, toes, or lips.   °Fatigue.   °Weight loss.   °Frequent infections or episodes when breathing symptoms become much worse (exacerbations).   °Chest tightness. °DIAGNOSIS  °Your health care provider will take a medical history and perform a physical examination to make the initial diagnosis.  Additional tests for COPD may include:  °Lung (pulmonary) function tests. °Chest X-ray. °CT scan. °Blood tests. °TREATMENT  °Treatment available to help you feel better when you have COPD includes:  °Inhaler and nebulizer medicines. These help manage the symptoms of COPD and make your breathing more  comfortable. °Supplemental oxygen. Supplemental oxygen is only helpful if you have a low oxygen level in your blood.   °Exercise and physical activity. These are beneficial for nearly all people with COPD. Some people may also benefit from a pulmonary rehabilitation program. °HOME CARE INSTRUCTIONS  °Take all medicines (inhaled or pills) as directed by your health care provider. °Avoid over-the-counter medicines or cough syrups that dry up your airway (such as antihistamines) and slow down the elimination of secretions unless instructed otherwise by your health care provider.   °If you are a smoker, the most important thing that you can do is stop smoking. Continuing to smoke will cause further lung damage and breathing trouble. Ask your health care provider for help with quitting smoking. He or she can direct you to community resources or hospitals that provide support. °Avoid exposure to irritants such as smoke, chemicals, and fumes that aggravate your breathing. °Use oxygen therapy and pulmonary rehabilitation if directed by your health care provider. If you require home oxygen therapy, ask your health care provider whether you should purchase a pulse oximeter to measure your oxygen level at home.   °Avoid contact with individuals who have a contagious illness. °Avoid extreme temperature and humidity changes. °Eat healthy foods. Eating smaller, more frequent meals and resting before meals may help you maintain your strength. °Stay active, but balance activity with periods of rest. Exercise and physical activity will help you maintain your ability to do things you want to do. °Preventing infection and hospitalization is very important when you have COPD. Make sure to receive all the vaccines your health care provider recommends, especially the pneumococcal and influenza   vaccines. Ask your health care provider whether you need a pneumonia vaccine. °Learn and use relaxation techniques to manage stress. °Learn and  use controlled breathing techniques as directed by your health care provider. Controlled breathing techniques include:   °Pursed lip breathing. Start by breathing in (inhaling) through your nose for 1 second. Then, purse your lips as if you were going to whistle and breathe out (exhale) through the pursed lips for 2 seconds.   °Diaphragmatic breathing. Start by putting one hand on your abdomen just above your waist. Inhale slowly through your nose. The hand on your abdomen should move out. Then purse your lips and exhale slowly. You should be able to feel the hand on your abdomen moving in as you exhale.   °Learn and use controlled coughing to clear mucus from your lungs. Controlled coughing is a series of short, progressive coughs. The steps of controlled coughing are:   °Lean your head slightly forward.   °Breathe in deeply using diaphragmatic breathing.   °Try to hold your breath for 3 seconds.   °Keep your mouth slightly open while coughing twice.   °Spit any mucus out into a tissue.   °Rest and repeat the steps once or twice as needed. °SEEK MEDICAL CARE IF:  °You are coughing up more mucus than usual.   °There is a change in the color or thickness of your mucus.   °Your breathing is more labored than usual.   °Your breathing is faster than usual.   °SEEK IMMEDIATE MEDICAL CARE IF:  °You have shortness of breath while you are resting.   °You have shortness of breath that prevents you from: °Being able to talk.   °Performing your usual physical activities.   °You have chest pain lasting longer than 5 minutes.   °Your skin color is more cyanotic than usual. °You measure low oxygen saturations for longer than 5 minutes with a pulse oximeter. °MAKE SURE YOU:  °Understand these instructions. °Will watch your condition. °Will get help right away if you are not doing well or get worse. °Document Released: 01/26/2005 Document Revised: 09/02/2013 Document Reviewed: 12/13/2012 °[employer]® Patient Information ©2015  [employer]. This information is not intended to replace advice given to you by your health care provider. Make sure you discuss any questions you have with your health care provider. ° °

## 2016-04-23 NOTE — ED Triage Notes (Signed)
Pt c/o increasing SOB X 5-6 days. Denies CP, N/V/D, fever. Pt is wearing 2L [place].

## 2016-04-23 NOTE — ED Provider Notes (Signed)
Emergency Department Provider Note   I have reviewed the triage vital signs and the nursing notes.   HISTORY  Chief Complaint Shortness of Breath   HPI Jeffery [name] is a 65 y.o. male with PMH of COPD, HLD, HTN, and home O2 (2L) presents to the emergency department for evaluation of progressively worsening difficulty breathing. Patient states it feels like a COPD exacerbation. He's had no associated fever or chills. No chest pain. Patient continues to be compliant with his home oxygen. He is been using his COPD medications daily and has been requiring his albuterol rescue inhaler more frequently. He states that he has been following with his primary care physician as well. No sick contacts. No unilateral leg swelling or other PE risk factors.    Past Medical History:  Diagnosis Date  . Chest pain 04/2011   Normal echocardiogram  . COPD (chronic obstructive pulmonary disease) (HCC)   . Fasting hyperglycemia    Normal hemoglobin A1c of 6.0  . GERD (gastroesophageal reflux disease)   . Hyperlipemia   . Hypertension   . On home O2    2L [place]  . Prediabetes 06/04/2015  . Tobacco abuse     Patient Active Problem List   Diagnosis Date Noted  . Prediabetes 06/04/2015  . Hyperglycemia, drug-induced 06/03/2015  . Essential hypertension 06/03/2015  . COPD exacerbation (HCC) 06/02/2015  . Hyperlipidemia 07/20/2011  . Fasting hyperglycemia   . Hypertension   . Tobacco abuse   . Chest pain 04/02/2011    Past Surgical History:  Procedure Laterality Date  . APPENDECTOMY    . APPENDECTOMY    . LEFT HEART CATHETERIZATION WITH CORONARY ANGIOGRAM N/A 05/17/2011   Procedure: LEFT HEART CATHETERIZATION WITH CORONARY ANGIOGRAM;  Surgeon: Tonny Bollman, MD;  Location: [facility] CATH LAB;  Service: Cardiovascular;  Laterality: N/A;    Current Outpatient Rx  . Order #: [id number] Class: Print  . Order #: [id number] Class: Print  . Order #: [id number] Class: Historical Med  . Order #:  [id number] Class: Historical Med  . Order #: [id number] Class: Historical Med  . Order #: [id number] Class: Historical Med  . Order #: [id number] Class: Historical Med  . Order #: [id number] Class: Historical Med  . Order #: [id number] Class: Historical Med  . Order #: [id number] Class: Historical Med  . Order #: [id number] Class: Historical Med  . Order #: [id number] Class: Historical Med  . Order #: [id number] Class: Historical Med  . Order #: [id number] Class: Historical Med  . Order #: [id number] Class: Historical Med  . Order #: [id number] Class: Historical Med  . Order #: [id number] Class: Historical Med  . Order #: [id number] Class: Historical Med  . Order #: [id number] Class: Historical Med  . Order #: [id number] Class: Print  . Order #: [id number] Class: Print  . Order #: [id number] Class: Print  . Order #: [id number] Class: Print  . [START ON 04/24/2016] Order #: [id number] Class: Print    Allergies Codeine  Family History  Problem Relation Age of Onset  . Emphysema Father     Social History Social History  Substance Use Topics  . Smoking status: Former Smoker    Packs/day: 1.00    Years: 35.00    Types: Cigarettes    Quit date: 05/09/2013  . Smokeless tobacco: Never Used  . Alcohol use No    Review of Systems  Constitutional: No fever/chills Eyes: No visual changes. ENT: No sore throat. Cardiovascular: Denies chest pain. Respiratory: Positive shortness of breath and cough.  Gastrointestinal: No abdominal pain.  No nausea, no vomiting.  No diarrhea.  No constipation. Genitourinary: Negative  for dysuria. Musculoskeletal: Negative for back pain. Skin: Negative for rash. Neurological: Negative for headaches, focal weakness or numbness.  10-point ROS otherwise negative.  ____________________________________________   PHYSICAL EXAM:  VITAL SIGNS: ED Triage Vitals  Enc Vitals Group     BP 04/23/16 1452 129/77     Pulse Rate 04/23/16 1452 98     Resp 04/23/16 1452 22     Temp 04/23/16 1452  97.9 F (36.6 C)     Temp Source 04/23/16 1452 Oral     SpO2 04/23/16 1452 98 %     Weight 04/23/16 1452 119 lb (54 kg)     Height 04/23/16 1452 5\' 7"  (1.702 m)     Pain Score 04/23/16 1450 0   Constitutional: Alert and oriented. Well appearing and in no acute distress. Eyes: Conjunctivae are normal. Head: Atraumatic. Nose: No congestion/rhinnorhea. Mouth/Throat: Mucous membranes are moist.  Oropharynx non-erythematous. Neck: No stridor.  Cardiovascular: Normal rate, regular rhythm. Good peripheral circulation. Grossly normal heart sounds.   Respiratory: Normal respiratory effort.  No retractions. Lungs with diminished air entry. No wheezing.  Gastrointestinal: Soft and nontender. No distention.  Musculoskeletal: No lower extremity tenderness nor edema. No gross deformities of extremities. Neurologic:  Normal speech and language. No gross focal neurologic deficits are appreciated.  Skin:  Skin is warm, dry and intact. No rash noted.  ____________________________________________   LABS (all labs ordered are listed, but only abnormal results are displayed)  Labs Reviewed  BASIC METABOLIC PANEL - Abnormal; Notable for the following:       Result Value   Potassium 3.3 (*)    Chloride 97 (*)    CO2 34 (*)    Glucose, Bld 102 (*)    BUN <5 (*)    All other components within normal limits  CBC - Abnormal; Notable for the following:    Hemoglobin 12.9 (*)    All other components within normal limits   ____________________________________________  EKG   EKG Interpretation  Date/Time:  Saturday April 23 2016 14:52:42 EST Ventricular Rate:  82 PR Interval:  158 QRS Duration: 86 QT Interval:  388 QTC Calculation: 453 R Axis:   77 Text Interpretation:  Normal sinus rhythm Possible Left atrial enlargement Nonspecific T wave abnormality Abnormal ECG No STEMI. Similar to prior.  Confirmed by Rahm Minix MD, Kaylea Mounsey [phone]([id number]) on 04/23/2016 3:01:22 PM        ____________________________________________  RADIOLOGY  Dg Chest 2 View  Result Date: 04/23/2016 CLINICAL DATA:  Shortness of breath increasing for 5 to 6 days, history hypertension, COPD EXAM: CHEST  2 VIEW COMPARISON:  04/11/2016 FINDINGS: Normal heart size, mediastinal contours, and pulmonary vascularity. Lungs emphysematous but clear. No pulmonary infiltrate, pleural effusion, or pneumothorax. Bones diffusely demineralized. IMPRESSION: Emphysematous changes without infiltrate. Electronically Signed   By: Ulyses SouthwardMark  Boles M.D.   On: 04/23/2016 16:20    ____________________________________________   PROCEDURES  Procedure(s) performed:   Procedures  None ____________________________________________   INITIAL IMPRESSION / ASSESSMENT AND PLAN / ED COURSE  Pertinent labs & imaging results that were available during my care of the patient were reviewed by me and considered in my medical decision making (see chart for details).  Patient resents to the emergency room in for evaluation dyspnea and cough. He has a history of COPD and states that this feels like a mild to moderate exacerbation. He complained his medications. His exam is significant for slightly diminished air entry with no appreciable wheezing. The patient is comfortable and satting well  on home oxygen. Plan for urine and, steroid, chest x-ray, basic labs. Will reassess him with plan to likely discharge with continued supportive care and primary care physician follow-up.   4:44 PM Patient with improved breathing symptoms after Duoneb and steroids. No PNA or other acute process on CXR. Plan for steroid burst and PCP follow up. Discussed plan with the patient who is pleased at discharge.   At this time, I do not feel there is any life-threatening condition present. I have reviewed and discussed all results (EKG, imaging, lab, urine as appropriate), exam findings with patient. I have reviewed nursing notes and appropriate  previous records.  I feel the patient is safe to be discharged home without further emergent workup. Discussed usual and customary return precautions. Patient and family (if present) verbalize understanding and are comfortable with this plan.  Patient will follow-up with their primary care provider. If they do not have a primary care provider, information for follow-up has been provided to them. All questions have been answered.  ____________________________________________  FINAL CLINICAL IMPRESSION(S) / ED DIAGNOSES  Final diagnoses:  COPD exacerbation (HCC)     MEDICATIONS GIVEN DURING THIS VISIT:  Medications  albuterol (PROVENTIL) (2.5 MG/3ML) 0.083% nebulizer solution 5 mg (5 mg Nebulization Given 04/23/16 1511)  predniSONE (DELTASONE) tablet 40 mg (40 mg Oral Given 04/23/16 1517)     NEW OUTPATIENT MEDICATIONS STARTED DURING THIS VISIT:  New Prescriptions   ALBUTEROL (PROVENTIL HFA;VENTOLIN HFA) 108 (90 BASE) MCG/ACT INHALER    Inhale 1-2 puffs into the lungs every 6 (six) hours as needed for wheezing or shortness of breath.   AZITHROMYCIN (ZITHROMAX) 250 MG TABLET    Take 1 tablet (250 mg total) by mouth daily. Take first 2 tablets together, then 1 every day until finished.   PREDNISONE (DELTASONE) 20 MG TABLET    Take 2 tablets (40 mg total) by mouth daily.      Note:  This document was prepared using Dragon voice recognition software and may include unintentional dictation errors.  Alona BeneJoshua Cheron Coryell, MD Emergency Medicine   Maia PlanJoshua G Sumiko Ceasar, MD 04/23/16 [phone]

## 2016-08-26 ENCOUNTER — Encounter (HOSPITAL_COMMUNITY): Payer: Self-pay

## 2016-08-26 ENCOUNTER — Emergency Department (HOSPITAL_COMMUNITY): Payer: Self-pay

## 2016-08-26 ENCOUNTER — Emergency Department (HOSPITAL_COMMUNITY)
Admission: EM | Admit: 2016-08-26 | Discharge: 2016-08-26 | Disposition: A | Discharge status: Home / Self Care | Payer: Self-pay | Attending: Emergency Medicine | Admitting: Emergency Medicine

## 2016-08-26 DIAGNOSIS — Z79899 Other long term (current) drug therapy: Secondary | ICD-10-CM | POA: Insufficient documentation

## 2016-08-26 DIAGNOSIS — Z87891 Personal history of nicotine dependence: Secondary | ICD-10-CM | POA: Insufficient documentation

## 2016-08-26 DIAGNOSIS — I10 Essential (primary) hypertension: Secondary | ICD-10-CM | POA: Insufficient documentation

## 2016-08-26 DIAGNOSIS — Z7984 Long term (current) use of oral hypoglycemic drugs: Secondary | ICD-10-CM | POA: Insufficient documentation

## 2016-08-26 DIAGNOSIS — Z7982 Long term (current) use of aspirin: Secondary | ICD-10-CM | POA: Insufficient documentation

## 2016-08-26 DIAGNOSIS — J441 Chronic obstructive pulmonary disease with (acute) exacerbation: Secondary | ICD-10-CM | POA: Insufficient documentation

## 2016-08-26 LAB — CBC WITH DIFFERENTIAL/PLATELET
Basophils Absolute: 0 10*3/uL (ref 0.0–0.1)
Basophils Relative: 0 %
EOS PCT: 10 %
Eosinophils Absolute: 0.8 10*3/uL — ABNORMAL HIGH (ref 0.0–0.7)
HEMATOCRIT: 39.2 % (ref 39.0–52.0)
HEMOGLOBIN: 13 g/dL (ref 13.0–17.0)
LYMPHS ABS: 0.8 10*3/uL (ref 0.7–4.0)
LYMPHS PCT: 11 %
MCH: 29.5 pg (ref 26.0–34.0)
MCHC: 33.2 g/dL (ref 30.0–36.0)
MCV: 89.1 fL (ref 78.0–100.0)
MONOS PCT: 7 %
Monocytes Absolute: 0.6 10*3/uL (ref 0.1–1.0)
Neutro Abs: 5.8 10*3/uL (ref 1.7–7.7)
Neutrophils Relative %: 72 %
PLATELETS: 318 10*3/uL (ref 150–400)
RBC: 4.4 MIL/uL (ref 4.22–5.81)
RDW: 13.1 % (ref 11.5–15.5)
WBC: 8 10*3/uL (ref 4.0–10.5)

## 2016-08-26 LAB — BASIC METABOLIC PANEL
ANION GAP: 9 (ref 5–15)
CHLORIDE: 92 mmol/L — AB (ref 101–111)
CO2: 33 mmol/L — AB (ref 22–32)
Calcium: 9.4 mg/dL (ref 8.9–10.3)
Creatinine, Ser: 0.69 mg/dL (ref 0.61–1.24)
GFR calc Af Amer: >60 mL/min (ref >60)
GFR calc non Af Amer: >60 mL/min (ref >60)
Glucose, Bld: 110 mg/dL — ABNORMAL HIGH (ref 65–99)
POTASSIUM: 3.1 mmol/L — AB (ref 3.5–5.1)
Sodium: 134 mmol/L — ABNORMAL LOW (ref 135–145)

## 2016-08-26 MED ORDER — PREDNISONE 20 MG PO TABS: 20.0000 mg | ORAL_TABLET | Freq: Every day | ORAL | 0 refills | Status: DC

## 2016-08-26 MED ORDER — PREDNISONE 20 MG PO TABS
40.0000 mg | ORAL_TABLET | Freq: Every day | ORAL | Status: DC
  Administered 2016-08-26: 40 mg via ORAL
  Filled 2016-08-26: qty 2

## 2016-08-26 MED ORDER — IPRATROPIUM-ALBUTEROL 0.5-2.5 (3) MG/3ML IN SOLN
3.0000 mL | Freq: Once | RESPIRATORY_TRACT | Status: AC
  Administered 2016-08-26: 3 mL via RESPIRATORY_TRACT
  Filled 2016-08-26: qty 3

## 2016-08-26 NOTE — Discharge Instructions (Signed)
Chest xray was good.  Rx for prednisone.  Follow up your dr

## 2016-08-26 NOTE — ED Notes (Signed)
RT aware of breathing tx, advised will go to xray first.

## 2016-08-26 NOTE — ED Provider Notes (Signed)
[facility]-EMERGENCY DEPT Provider Note   CSN: [id number] Arrival date & time: 08/26/16  1141     History   Chief Complaint Chief Complaint  Patient presents with  . Shortness of Breath  . Back Pain    HPI Jeffery [name] is a 66 y.o. male.  Patient with known history of COPD presents with increased coughing and wheezing. He has been using his inhaler and nebulizer machine at home.  No fever, sweats, chills, rusty sputum.        Past Medical History:  Diagnosis Date  . Chest pain 04/2011   Normal echocardiogram  . COPD (chronic obstructive pulmonary disease) (HCC)   . Fasting hyperglycemia    Normal hemoglobin A1c of 6.0  . GERD (gastroesophageal reflux disease)   . Hyperlipemia   . Hypertension   . On home O2    2L [place]  . Prediabetes 06/04/2015  . Tobacco abuse     Patient Active Problem List   Diagnosis Date Noted  . Prediabetes 06/04/2015  . Hyperglycemia, drug-induced 06/03/2015  . Essential hypertension 06/03/2015  . COPD exacerbation (HCC) 06/02/2015  . Hyperlipidemia 07/20/2011  . Fasting hyperglycemia   . Hypertension   . Tobacco abuse   . Chest pain 04/02/2011    Past Surgical History:  Procedure Laterality Date  . APPENDECTOMY    . APPENDECTOMY    . LEFT HEART CATHETERIZATION WITH CORONARY ANGIOGRAM N/A 05/17/2011   Procedure: LEFT HEART CATHETERIZATION WITH CORONARY ANGIOGRAM;  Surgeon: Tonny Bollman, MD;  Location: [facility] CATH LAB;  Service: Cardiovascular;  Laterality: N/A;       Home Medications    Prior to Admission medications   Medication Sig Start Date End Date Taking? Authorizing Provider  albuterol (PROVENTIL HFA;VENTOLIN HFA) 108 (90 Base) MCG/ACT inhaler Inhale 2 puffs into the lungs every 4 (four) hours as needed for wheezing or shortness of breath. 03/10/16  Yes Samuel Jester, DO  albuterol (PROVENTIL) (2.5 MG/3ML) 0.083% nebulizer solution Take 3 mLs (2.5 mg total) by nebulization every 4 (four) hours as needed for wheezing or  shortness of breath. 03/10/16  Yes Samuel Jester, DO  ALPRAZolam Prudy Feeler) 0.5 MG tablet Take 0.5 mg by mouth at bedtime.   Yes Historical Provider, MD  aspirin EC 81 MG tablet Take 81 mg by mouth daily.   Yes Historical Provider, MD  budesonide-formoterol (SYMBICORT) 160-4.5 MCG/ACT inhaler Inhale 2 puffs into the lungs 2 (two) times daily.   Yes Historical Provider, MD  chlorthalidone (HYGROTON) 25 MG tablet Take 12.5 mg by mouth daily.    Yes Historical Provider, MD  gabapentin (NEURONTIN) 300 MG capsule Take 300 mg by mouth 2 (two) times daily.  03/22/13  Yes Historical Provider, MD  ipratropium-albuterol (DUONEB) 0.5-2.5 (3) MG/3ML SOLN Inhale 3 mLs into the lungs every 6 (six) hours as needed (for shortness of breath/wheezing).  07/17/14  Yes Historical Provider, MD  isosorbide mononitrate (IMDUR) 30 MG 24 hr tablet Take 30 mg by mouth daily. 03/22/14  Yes Historical Provider, MD  metFORMIN (GLUCOPHAGE) 500 MG tablet Take 500 mg by mouth daily.   Yes Historical Provider, MD  metoprolol succinate (TOPROL-XL) 50 MG 24 hr tablet Take 50 mg by mouth every morning. Take with or immediately following a meal.   Yes Historical Provider, MD  multivitamin (ONE-A-DAY MEN'S) TABS Take 1 tablet by mouth daily.     Yes Historical Provider, MD  omeprazole (PRILOSEC) 20 MG capsule Take 20 mg by mouth daily.  05/26/15  Yes Historical Provider, MD  OXYGEN Inhale 2 L into the lungs daily as needed.    Yes Historical Provider, MD  polyethylene glycol (MIRALAX / GLYCOLAX) packet Take 17 g by mouth daily as needed for mild constipation.   Yes Historical Provider, MD  potassium chloride SA (K-DUR,KLOR-CON) 20 MEQ tablet Take 20 mEq by mouth daily.  05/12/14  Yes Historical Provider, MD  ranitidine (ZANTAC) 300 MG tablet Take 300 mg by mouth at bedtime.   Yes Historical Provider, MD  simvastatin (ZOCOR) 20 MG tablet Take 20 mg by mouth at bedtime.  03/22/14  Yes Historical Provider, MD  albuterol (PROVENTIL HFA;VENTOLIN  HFA) 108 (90 Base) MCG/ACT inhaler Inhale 1-2 puffs into the lungs every 6 (six) hours as needed for wheezing or shortness of breath. Patient not taking: Reported on 08/26/2016 04/23/16   Maia Plan, MD  azithromycin (ZITHROMAX) 250 MG tablet Take 1 tablet (250 mg total) by mouth daily. Take first 2 tablets together, then 1 every day until finished. Patient not taking: Reported on 08/26/2016 04/23/16   Maia Plan, MD  predniSONE (DELTASONE) 20 MG tablet Take 1 tablet (20 mg total) by mouth daily with breakfast. 08/26/16   Donnetta Hutching, MD    Family History Family History  Problem Relation Age of Onset  . Emphysema Father     Social History Social History  Substance Use Topics  . Smoking status: Former Smoker    Packs/day: 1.00    Years: 35.00    Types: Cigarettes    Quit date: 05/09/2013  . Smokeless tobacco: Never Used  . Alcohol use No     Allergies   Codeine   Review of Systems Review of Systems  All other systems reviewed and are negative.    Physical Exam Updated Vital Signs BP (!) 126/91   Pulse 82   Temp 97.8 F (36.6 C) (Oral)   Resp 11   Wt 119 lb (54 kg)   SpO2 100%   BMI 18.64 kg/m   Physical Exam  Constitutional: He is oriented to person, place, and time.  NAD  HENT:  Head: Normocephalic and atraumatic.  Eyes: Conjunctivae are normal.  Neck: Neck supple.  Cardiovascular: Normal rate and regular rhythm.   Pulmonary/Chest:  bilat wheezing  Abdominal: Soft. Bowel sounds are normal.  Musculoskeletal: Normal range of motion.  Neurological: He is alert and oriented to person, place, and time.  Skin: Skin is warm and dry.  Psychiatric: He has a normal mood and affect. His behavior is normal.  Nursing note and vitals reviewed.    ED Treatments / Results  Labs (all labs ordered are listed, but only abnormal results are displayed) Labs Reviewed  CBC WITH DIFFERENTIAL/PLATELET - Abnormal; Notable for the following:       Result Value    Eosinophils Absolute 0.8 (*)    All other components within normal limits  BASIC METABOLIC PANEL - Abnormal; Notable for the following:    Sodium 134 (*)    Potassium 3.1 (*)    Chloride 92 (*)    CO2 33 (*)    Glucose, Bld 110 (*)    BUN <5 (*)    All other components within normal limits    EKG  EKG Interpretation None       Radiology Dg Chest 2 View  Result Date: 08/26/2016 CLINICAL DATA:  COPD.  Shortness of breath EXAM: CHEST  2 VIEW COMPARISON:  05/17/2016 FINDINGS: There is hyperinflation of the lungs compatible with COPD. Heart and mediastinal contours  are within normal limits. No focal opacities or effusions. No acute bony abnormality. IMPRESSION: COPD.  No active disease. Electronically Signed   By: Charlett Nose M.D.   On: 08/26/2016 13:45    Procedures Procedures (including critical care time)  Medications Ordered in ED Medications  predniSONE (DELTASONE) tablet 40 mg (40 mg Oral Given 08/26/16 1323)  ipratropium-albuterol (DUONEB) 0.5-2.5 (3) MG/3ML nebulizer solution 3 mL (3 mLs Nebulization Given 08/26/16 1404)     Initial Impression / Assessment and Plan / ED Course  I have reviewed the triage vital signs and the nursing notes.  Pertinent labs & imaging results that were available during my care of the patient were reviewed by me and considered in my medical decision making (see chart for details).     Pt has known hx of COPD.  He feels better after albuterol/atrovent breathing Rx.  Discharge meds prednisone 20 mg.  Final Clinical Impressions(s) / ED Diagnoses   Final diagnoses:  COPD exacerbation (HCC)    New Prescriptions New Prescriptions   PREDNISONE (DELTASONE) 20 MG TABLET    Take 1 tablet (20 mg total) by mouth daily with breakfast.     Donnetta Hutching, MD 08/26/16 1528

## 2016-08-26 NOTE — ED Triage Notes (Signed)
Pt reports that he feels as if breathing has become more labored over the last week. Denies coughing. Chronic O2 at 2 L/min. Pt also reports right lower back pain from bending over to fill up gas tank to lawn mower

## 2016-08-26 NOTE — ED Triage Notes (Signed)
Pt has a hx of COPD, reports that he is breathing worse today  PCP Dr Polly Cobia

## 2016-08-26 NOTE — ED Notes (Signed)
Pt taken to xray 

## 2016-11-06 ENCOUNTER — Emergency Department (HOSPITAL_COMMUNITY): Payer: Self-pay

## 2016-11-06 ENCOUNTER — Inpatient Hospital Stay (HOSPITAL_COMMUNITY)
Admission: EM | Admit: 2016-11-06 | Discharge: 2016-11-08 | DRG: 193 | Disposition: A | Discharge status: Home / Self Care | Payer: Self-pay | Attending: Internal Medicine | Admitting: Internal Medicine

## 2016-11-06 ENCOUNTER — Encounter (HOSPITAL_COMMUNITY): Payer: Self-pay | Admitting: *Deleted

## 2016-11-06 DIAGNOSIS — Z7984 Long term (current) use of oral hypoglycemic drugs: Secondary | ICD-10-CM

## 2016-11-06 DIAGNOSIS — Z87891 Personal history of nicotine dependence: Secondary | ICD-10-CM

## 2016-11-06 DIAGNOSIS — E785 Hyperlipidemia, unspecified: Secondary | ICD-10-CM | POA: Diagnosis present

## 2016-11-06 DIAGNOSIS — Z7952 Long term (current) use of systemic steroids: Secondary | ICD-10-CM

## 2016-11-06 DIAGNOSIS — I10 Essential (primary) hypertension: Secondary | ICD-10-CM | POA: Diagnosis present

## 2016-11-06 DIAGNOSIS — E876 Hypokalemia: Secondary | ICD-10-CM | POA: Diagnosis present

## 2016-11-06 DIAGNOSIS — J440 Chronic obstructive pulmonary disease with acute lower respiratory infection: Secondary | ICD-10-CM | POA: Diagnosis present

## 2016-11-06 DIAGNOSIS — J441 Chronic obstructive pulmonary disease with (acute) exacerbation: Secondary | ICD-10-CM | POA: Diagnosis present

## 2016-11-06 DIAGNOSIS — Z7982 Long term (current) use of aspirin: Secondary | ICD-10-CM

## 2016-11-06 DIAGNOSIS — J9621 Acute and chronic respiratory failure with hypoxia: Secondary | ICD-10-CM | POA: Diagnosis present

## 2016-11-06 DIAGNOSIS — Z9981 Dependence on supplemental oxygen: Secondary | ICD-10-CM

## 2016-11-06 DIAGNOSIS — J189 Pneumonia, unspecified organism: Principal | ICD-10-CM | POA: Diagnosis present

## 2016-11-06 DIAGNOSIS — J181 Lobar pneumonia, unspecified organism: Secondary | ICD-10-CM

## 2016-11-06 DIAGNOSIS — Z79899 Other long term (current) drug therapy: Secondary | ICD-10-CM

## 2016-11-06 DIAGNOSIS — K219 Gastro-esophageal reflux disease without esophagitis: Secondary | ICD-10-CM | POA: Diagnosis present

## 2016-11-06 DIAGNOSIS — E119 Type 2 diabetes mellitus without complications: Secondary | ICD-10-CM | POA: Diagnosis present

## 2016-11-06 LAB — CBC WITH DIFFERENTIAL/PLATELET
BASOS PCT: 0 %
Basophils Absolute: 0 10*3/uL (ref 0.0–0.1)
EOS ABS: 0.3 10*3/uL (ref 0.0–0.7)
Eosinophils Relative: 2 %
HCT: 37.6 % — ABNORMAL LOW (ref 39.0–52.0)
HEMOGLOBIN: 12.8 g/dL — AB (ref 13.0–17.0)
Lymphocytes Relative: 8 %
Lymphs Abs: 1.4 10*3/uL (ref 0.7–4.0)
MCH: 28.8 pg (ref 26.0–34.0)
MCHC: 34 g/dL (ref 30.0–36.0)
MCV: 84.5 fL (ref 78.0–100.0)
Monocytes Absolute: 1.9 10*3/uL — ABNORMAL HIGH (ref 0.1–1.0)
Monocytes Relative: 11 %
NEUTROS PCT: 79 %
Neutro Abs: 13.7 10*3/uL — ABNORMAL HIGH (ref 1.7–7.7)
Platelets: 400 10*3/uL (ref 150–400)
RBC: 4.45 MIL/uL (ref 4.22–5.81)
RDW: 13.8 % (ref 11.5–15.5)
WBC: 17.3 10*3/uL — ABNORMAL HIGH (ref 4.0–10.5)

## 2016-11-06 MED ORDER — IPRATROPIUM BROMIDE 0.02 % IN SOLN
0.5000 mg | Freq: Once | RESPIRATORY_TRACT | Status: AC
  Administered 2016-11-06: 0.5 mg via RESPIRATORY_TRACT
  Filled 2016-11-06: qty 2.5

## 2016-11-06 MED ORDER — ALBUTEROL (5 MG/ML) CONTINUOUS INHALATION SOLN
15.0000 mg/h | INHALATION_SOLUTION | RESPIRATORY_TRACT | Status: DC
  Administered 2016-11-06: 15 mg/h via RESPIRATORY_TRACT
  Filled 2016-11-06: qty 20

## 2016-11-06 MED ORDER — METHYLPREDNISOLONE SODIUM SUCC 125 MG IJ SOLR
125.0000 mg | Freq: Once | INTRAMUSCULAR | Status: AC
  Administered 2016-11-06: 125 mg via INTRAVENOUS
  Filled 2016-11-06: qty 2

## 2016-11-06 NOTE — ED Provider Notes (Signed)
[facility] DEPT Provider Note   CSN: [id number] Arrival date & time: 11/06/16  2305     History   Chief Complaint Chief Complaint  Patient presents with  . Shortness of Breath    HPI Jeffery [name] is a 66 y.o. male.  Patient with history of COPD presenting with difficulty breathing since "church this morning". He feels this is an exacerbation of his COPD. Has been using his home nebulizer with Symbicort and albuterol every 6 hours without relief. He also has home oxygen of 2 L which she has not had increase. Also has had constant right-sided chest pain since this morning it is worse with palpation and breathing. Cough productive of clear mucus. Denies fever, chills, nausea or vomiting. No abdominal pain. No leg swelling. No cardiac history or CHF history. He is no longer smoking.   The history is provided by the patient and the spouse. The history is limited by the condition of the patient.  Shortness of Breath  Associated symptoms include cough and chest pain. Pertinent negatives include no fever, no headaches, no vomiting and no abdominal pain.    Past Medical History:  Diagnosis Date  . Chest pain 04/2011   Normal echocardiogram  . COPD (chronic obstructive pulmonary disease) (HCC)   . Fasting hyperglycemia    Normal hemoglobin A1c of 6.0  . GERD (gastroesophageal reflux disease)   . Hyperlipemia   . Hypertension   . On home O2    2L N/C  . Prediabetes 06/04/2015  . Tobacco abuse     Patient Active Problem List   Diagnosis Date Noted  . Prediabetes 06/04/2015  . Hyperglycemia, drug-induced 06/03/2015  . Essential hypertension 06/03/2015  . COPD exacerbation (HCC) 06/02/2015  . Hyperlipidemia 07/20/2011  . Fasting hyperglycemia   . Hypertension   . Tobacco abuse   . Chest pain 04/02/2011    Past Surgical History:  Procedure Laterality Date  . APPENDECTOMY    . APPENDECTOMY    . LEFT HEART CATHETERIZATION WITH CORONARY ANGIOGRAM N/A 05/17/2011   Procedure: LEFT HEART CATHETERIZATION WITH CORONARY ANGIOGRAM;  Surgeon: Tonny Bollman, MD;  Location: [facility] CATH LAB;  Service: Cardiovascular;  Laterality: N/A;       Home Medications    Prior to Admission medications   Medication Sig Start Date End Date Taking? Authorizing Provider  albuterol (PROVENTIL HFA;VENTOLIN HFA) 108 (90 Base) MCG/ACT inhaler Inhale 2 puffs into the lungs every 4 (four) hours as needed for wheezing or shortness of breath. 03/10/16   Samuel Jester, DO  albuterol (PROVENTIL HFA;VENTOLIN HFA) 108 (90 Base) MCG/ACT inhaler Inhale 1-2 puffs into the lungs every 6 (six) hours as needed for wheezing or shortness of breath. Patient not taking: Reported on 08/26/2016 04/23/16   Long, Arlyss Repress, MD  albuterol (PROVENTIL) (2.5 MG/3ML) 0.083% nebulizer solution Take 3 mLs (2.5 mg total) by nebulization every 4 (four) hours as needed for wheezing or shortness of breath. 03/10/16   Samuel Jester, DO  ALPRAZolam Prudy Feeler) 0.5 MG tablet Take 0.5 mg by mouth at bedtime.    [provider]  aspirin EC 81 MG tablet Take 81 mg by mouth daily.    [provider]  azithromycin (ZITHROMAX) 250 MG tablet Take 1 tablet (250 mg total) by mouth daily. Take first 2 tablets together, then 1 every day until finished. Patient not taking: Reported on 08/26/2016 04/23/16   Maia Plan, MD  budesonide-formoterol [facility]) 160-4.5 MCG/ACT inhaler Inhale 2 puffs into the lungs 2 (two) times  daily.    [provider]  chlorthalidone (HYGROTON) 25 MG tablet Take 12.5 mg by mouth daily.     [provider]  gabapentin (NEURONTIN) 300 MG capsule Take 300 mg by mouth 2 (two) times daily.  03/22/13   [provider]  ipratropium-albuterol (DUONEB) 0.5-2.5 (3) MG/3ML SOLN Inhale 3 mLs into the lungs every 6 (six) hours as needed (for shortness of breath/wheezing).  07/17/14   [provider]  isosorbide mononitrate (IMDUR) 30 MG 24 hr tablet Take 30 mg  by mouth daily. 03/22/14   [provider]  metFORMIN (GLUCOPHAGE) 500 MG tablet Take 500 mg by mouth daily.    [provider]  metoprolol succinate (TOPROL-XL) 50 MG 24 hr tablet Take 50 mg by mouth every morning. Take with or immediately following a meal.    [provider]  multivitamin (ONE-A-DAY MEN'S) TABS Take 1 tablet by mouth daily.      [provider]  omeprazole (PRILOSEC) 20 MG capsule Take 20 mg by mouth daily.  05/26/15   [provider]  OXYGEN Inhale 2 L into the lungs daily as needed.     [provider]  polyethylene glycol (MIRALAX / GLYCOLAX) packet Take 17 g by mouth daily as needed for mild constipation.    [provider]  potassium chloride SA (K-DUR,KLOR-CON) 20 MEQ tablet Take 20 mEq by mouth daily.  05/12/14   [provider]  predniSONE (DELTASONE) 20 MG tablet Take 1 tablet (20 mg total) by mouth daily with breakfast. 08/26/16   Donnetta Hutchingook, Brian, MD  ranitidine (ZANTAC) 300 MG tablet Take 300 mg by mouth at bedtime.    [provider]  simvastatin (ZOCOR) 20 MG tablet Take 20 mg by mouth at bedtime.  03/22/14   [provider]    Family History Family History  Problem Relation Age of Onset  . Emphysema Father     Social History Social History  Substance Use Topics  . Smoking status: Former Smoker    Packs/day: 1.00    Years: 35.00    Types: Cigarettes    Quit date: 05/09/2013  . Smokeless tobacco: Never Used  . Alcohol use No     Allergies   Codeine   Review of Systems Review of Systems  Constitutional: Negative for activity change, appetite change and fever.  HENT: Positive for congestion.   Respiratory: Positive for cough and shortness of breath.   Cardiovascular: Positive for chest pain.  Gastrointestinal: Negative for abdominal pain, nausea and vomiting.  Genitourinary: Negative for dysuria and hematuria.  Musculoskeletal: Negative for arthralgias and  myalgias.  Neurological: Negative for dizziness, weakness and headaches.    all other systems are negative except as noted in the HPI and PMH.    Physical Exam Updated Vital Signs BP (!) 147/98   Pulse (!) 112   Temp 98.8 F (37.1 C) (Oral)   Resp (!) 34   Ht 5\' 7"  (1.702 m)   Wt 54.4 kg (120 lb)   SpO2 99%   BMI 18.79 kg/m   Physical Exam  Constitutional: He is oriented to person, place, and time. He appears well-developed and well-nourished. He appears distressed.  Increased work of breathing, speaking in short phrases  HENT:  Head: Normocephalic and atraumatic.  Mouth/Throat: Oropharynx is clear and moist. No oropharyngeal exudate.  Eyes: Conjunctivae and EOM are normal. Pupils are equal, round, and reactive to light.  Neck: Normal range of motion. Neck supple.  No meningismus.  Cardiovascular: Normal rate, regular rhythm, normal heart sounds and intact distal pulses.   No murmur heard. Pulmonary/Chest: He is in respiratory distress. He has wheezes. He exhibits tenderness.  Diminished breath sounds, poor air exchange, scattered expiratory wheezing R sided chest wall tenderness  Abdominal: Soft. There is no tenderness. There is no rebound and no guarding.  Musculoskeletal: Normal range of motion. He exhibits no edema or tenderness.  Neurological: He is alert and oriented to person, place, and time. No cranial nerve deficit. He exhibits normal muscle tone. Coordination normal.  No ataxia on finger to nose bilaterally. No pronator drift. 5/5 strength throughout. CN 2-12 intact.Equal grip strength. Sensation intact.   Skin: Skin is warm.  Psychiatric: He has a normal mood and affect. His behavior is normal.  Nursing note and vitals reviewed.    ED Treatments / Results  Labs (all labs ordered are listed, but only abnormal results are displayed) Labs Reviewed  CBC WITH DIFFERENTIAL/PLATELET - Abnormal; Notable for the following:       Result Value   WBC 17.3 (*)     Hemoglobin 12.8 (*)    HCT 37.6 (*)    Neutro Abs 13.7 (*)    Monocytes Absolute 1.9 (*)    All other components within normal limits  COMPREHENSIVE METABOLIC PANEL - Abnormal; Notable for the following:    Sodium 130 (*)    Potassium 2.9 (*)    Chloride 90 (*)    Glucose, Bld 148 (*)    ALT 16 (*)    All other components within normal limits  D-DIMER, QUANTITATIVE (NOT AT [facility]) - Abnormal; Notable for the following:    D-Dimer, Quant 0.61 (*)    All other components within normal limits  TROPONIN I  BRAIN NATRIURETIC PEPTIDE    EKG  EKG Interpretation  Date/Time:  Sunday November 06 2016 23:20:08 EDT Ventricular Rate:  114 PR Interval:    QRS Duration: 105 QT Interval:  325 QTC Calculation: 448 R Axis:   70 Text Interpretation:  Sinus tachycardia Biatrial enlargement Borderline repolarization abnormality Interpretation limited secondary to artifact Confirmed by Glynn Octave [phone]) on 11/06/2016 11:32:07 PM       Radiology Dg Chest 2 View  Result Date: 11/07/2016 CLINICAL DATA:  Initial evaluation for acute shortness of breath, right-sided chest pain with nonproductive cough. EXAM: CHEST  2 VIEW COMPARISON:  Prior radiograph from 08/26/2016. FINDINGS: Cardiac and mediastinal silhouettes are stable in size and contour, and remain within normal limits. Mild aortic atherosclerosis. Lungs normally inflated. Underlying emphysema. Dense consolidation within the right middle lobe, concerning for infiltrate given history of cough. No other focal airspace disease. No pulmonary edema or pleural effusion. No pneumothorax. No acute osseous abnormality. IMPRESSION: 1. Dense right middle lobe consolidation, concerning for pneumonia. Radiographic follow-up to resolution in 4-6 weeks recommended. 2. Emphysema. Electronically Signed   By: Rise Mu M.D.   On: 11/07/2016 00:48    Procedures Procedures (including critical care time)  Medications Ordered in ED Medications  albuterol  (PROVENTIL,VENTOLIN) solution continuous neb (not administered)  ipratropium (ATROVENT) nebulizer solution 0.5 mg (not administered)  methylPREDNISolone sodium succinate (SOLU-MEDROL) 125 mg/2 mL injection 125 mg (not administered)     Initial Impression / Assessment and Plan / ED Course  I have reviewed the triage vital signs and the nursing notes.  Pertinent labs & imaging results that were available during my care of the patient were reviewed by me and considered in my medical decision making (see chart for  details).    Difficulty breathing with history of COPD. Poor air exchange and scattered wheezing on exam. Right-sided chest pain that is reproducible.  Patiently given nebulizers and steroids. EKG without acute ischemia  Labs show hypokalemia. Age-adjusted d-dimer is negative.  Chest x-ray shows right middle lobe pneumonia. Patient will be given IV fluids and antibiotics after cultures obtained.  He ambulates with some desaturation on his home oxygen to the mid 80s. He is also remains tachycardic after albuterol and he is and is tachypneic. Additional fluids will be given an admission discussed with patient. D/w Dr. Jarvis Newcomer.   Final Clinical Impressions(s) / ED Diagnoses   Final diagnoses:  Community acquired pneumonia of right middle lobe of lung (HCC)  COPD exacerbation (HCC)    New Prescriptions New Prescriptions   No medications on file     Glynn Octave, MD 11/07/16 [phone]

## 2016-11-06 NOTE — ED Notes (Signed)
ED Provider at bedside. 

## 2016-11-06 NOTE — ED Triage Notes (Signed)
Pt c/o sob and right side chest pain that started today, cough that started two weeks ago,

## 2016-11-07 ENCOUNTER — Encounter (HOSPITAL_COMMUNITY): Payer: Self-pay | Admitting: Family Medicine

## 2016-11-07 DIAGNOSIS — J181 Lobar pneumonia, unspecified organism: Secondary | ICD-10-CM

## 2016-11-07 DIAGNOSIS — I10 Essential (primary) hypertension: Secondary | ICD-10-CM

## 2016-11-07 DIAGNOSIS — J189 Pneumonia, unspecified organism: Secondary | ICD-10-CM | POA: Diagnosis present

## 2016-11-07 DIAGNOSIS — J9621 Acute and chronic respiratory failure with hypoxia: Secondary | ICD-10-CM | POA: Diagnosis present

## 2016-11-07 DIAGNOSIS — E785 Hyperlipidemia, unspecified: Secondary | ICD-10-CM

## 2016-11-07 LAB — COMPREHENSIVE METABOLIC PANEL
ALBUMIN: 3.7 g/dL (ref 3.5–5.0)
ALT: 16 U/L — ABNORMAL LOW (ref 17–63)
ANION GAP: 11 (ref 5–15)
AST: 20 U/L (ref 15–41)
Alkaline Phosphatase: 87 U/L (ref 38–126)
BUN: 7 mg/dL (ref 6–20)
CHLORIDE: 90 mmol/L — AB (ref 101–111)
CO2: 29 mmol/L (ref 22–32)
Calcium: 9.3 mg/dL (ref 8.9–10.3)
Creatinine, Ser: 0.73 mg/dL (ref 0.61–1.24)
GFR calc Af Amer: >60 mL/min (ref >60)
GFR calc non Af Amer: >60 mL/min (ref >60)
GLUCOSE: 148 mg/dL — AB (ref 65–99)
POTASSIUM: 2.9 mmol/L — AB (ref 3.5–5.1)
SODIUM: 130 mmol/L — AB (ref 135–145)
Total Bilirubin: 0.8 mg/dL (ref 0.3–1.2)
Total Protein: 7.2 g/dL (ref 6.5–8.1)

## 2016-11-07 LAB — GLUCOSE, CAPILLARY
Glucose-Capillary: 236 mg/dL — ABNORMAL HIGH (ref 65–99)
Glucose-Capillary: 169 mg/dL — ABNORMAL HIGH (ref 65–99)
Glucose-Capillary: 154 mg/dL — ABNORMAL HIGH (ref 65–99)
Glucose-Capillary: 200 mg/dL — ABNORMAL HIGH (ref 65–99)

## 2016-11-07 LAB — I-STAT CG4 LACTIC ACID, ED: Lactic Acid, Venous: 1.58 mmol/L (ref 0.5–1.9)

## 2016-11-07 LAB — BASIC METABOLIC PANEL
Anion gap: 12 (ref 5–15)
BUN: 8 mg/dL (ref 6–20)
CO2: 28 mmol/L (ref 22–32)
Calcium: 9 mg/dL (ref 8.9–10.3)
Chloride: 94 mmol/L — ABNORMAL LOW (ref 101–111)
Creatinine, Ser: 0.81 mg/dL (ref 0.61–1.24)
GFR calc Af Amer: >60 mL/min (ref >60)
GFR calc non Af Amer: >60 mL/min (ref >60)
GLUCOSE: 270 mg/dL — AB (ref 65–99)
Potassium: 3.4 mmol/L — ABNORMAL LOW (ref 3.5–5.1)
Sodium: 134 mmol/L — ABNORMAL LOW (ref 135–145)

## 2016-11-07 LAB — CBC
HEMATOCRIT: 36 % — AB (ref 39.0–52.0)
Hemoglobin: 11.9 g/dL — ABNORMAL LOW (ref 13.0–17.0)
MCH: 28.1 pg (ref 26.0–34.0)
MCHC: 33.1 g/dL (ref 30.0–36.0)
MCV: 85.1 fL (ref 78.0–100.0)
Platelets: 419 10*3/uL — ABNORMAL HIGH (ref 150–400)
RBC: 4.23 MIL/uL (ref 4.22–5.81)
RDW: 14.1 % (ref 11.5–15.5)
WBC: 15.6 10*3/uL — ABNORMAL HIGH (ref 4.0–10.5)

## 2016-11-07 LAB — BRAIN NATRIURETIC PEPTIDE: B Natriuretic Peptide: 14 pg/mL (ref 0.0–100.0)

## 2016-11-07 LAB — TROPONIN I: Troponin I: <0.03 ng/mL (ref <0.03)

## 2016-11-07 LAB — D-DIMER, QUANTITATIVE: D-Dimer, Quant: 0.61 ug/mL-FEU — ABNORMAL HIGH (ref 0.00–0.50)

## 2016-11-07 MED ORDER — SODIUM CHLORIDE 0.9 % IV SOLN: 250.0000 mL | INTRAVENOUS | Status: DC | PRN

## 2016-11-07 MED ORDER — ALPRAZOLAM 0.5 MG PO TABS: 0.5000 mg | ORAL_TABLET | Freq: Every evening | ORAL | Status: DC | PRN

## 2016-11-07 MED ORDER — SODIUM CHLORIDE 0.9% FLUSH: 3.0000 mL | INTRAVENOUS | Status: DC | PRN

## 2016-11-07 MED ORDER — SODIUM CHLORIDE 0.9% FLUSH
3.0000 mL | Freq: Two times a day (BID) | INTRAVENOUS | Status: DC
  Administered 2016-11-07 – 2016-11-08 (×2): 3 mL via INTRAVENOUS

## 2016-11-07 MED ORDER — SIMVASTATIN 20 MG PO TABS
20.0000 mg | ORAL_TABLET | Freq: Every day | ORAL | Status: DC
  Administered 2016-11-07: 20 mg via ORAL
  Filled 2016-11-07: qty 1

## 2016-11-07 MED ORDER — SODIUM CHLORIDE 0.9 % IV BOLUS (SEPSIS)
1000.0000 mL | Freq: Once | INTRAVENOUS | Status: AC
  Administered 2016-11-07: 1000 mL via INTRAVENOUS

## 2016-11-07 MED ORDER — INSULIN ASPART 100 UNIT/ML ~~LOC~~ SOLN
0.0000 [IU] | Freq: Three times a day (TID) | SUBCUTANEOUS | Status: DC
  Administered 2016-11-07 (×2): 2 [IU] via SUBCUTANEOUS
  Administered 2016-11-07: 3 [IU] via SUBCUTANEOUS
  Administered 2016-11-08: 1 [IU] via SUBCUTANEOUS

## 2016-11-07 MED ORDER — ALBUTEROL SULFATE (2.5 MG/3ML) 0.083% IN NEBU: 2.5000 mg | INHALATION_SOLUTION | RESPIRATORY_TRACT | Status: DC | PRN

## 2016-11-07 MED ORDER — DEXTROSE 5 % IV SOLN
1.0000 g | INTRAVENOUS | Status: DC
  Administered 2016-11-07: 1 g via INTRAVENOUS
  Filled 2016-11-07 (×2): qty 10

## 2016-11-07 MED ORDER — POTASSIUM CHLORIDE CRYS ER 20 MEQ PO TBCR
40.0000 meq | EXTENDED_RELEASE_TABLET | Freq: Once | ORAL | Status: AC
  Administered 2016-11-07: 40 meq via ORAL
  Filled 2016-11-07: qty 2

## 2016-11-07 MED ORDER — MOMETASONE FURO-FORMOTEROL FUM 200-5 MCG/ACT IN AERO
2.0000 | INHALATION_SPRAY | Freq: Two times a day (BID) | RESPIRATORY_TRACT | Status: DC
  Administered 2016-11-07 – 2016-11-08 (×3): 2 via RESPIRATORY_TRACT
  Filled 2016-11-07: qty 8.8

## 2016-11-07 MED ORDER — ISOSORBIDE MONONITRATE ER 60 MG PO TB24
30.0000 mg | ORAL_TABLET | Freq: Every day | ORAL | Status: DC
  Administered 2016-11-07 – 2016-11-08 (×2): 30 mg via ORAL
  Filled 2016-11-07 (×2): qty 1

## 2016-11-07 MED ORDER — POTASSIUM CHLORIDE CRYS ER 20 MEQ PO TBCR
20.0000 meq | EXTENDED_RELEASE_TABLET | Freq: Every day | ORAL | Status: DC
  Administered 2016-11-07 – 2016-11-08 (×2): 20 meq via ORAL
  Filled 2016-11-07 (×2): qty 1

## 2016-11-07 MED ORDER — AZITHROMYCIN 250 MG PO TABS
500.0000 mg | ORAL_TABLET | ORAL | Status: DC
  Administered 2016-11-08: 500 mg via ORAL
  Filled 2016-11-07: qty 2

## 2016-11-07 MED ORDER — CHLORTHALIDONE 25 MG PO TABS
12.5000 mg | ORAL_TABLET | Freq: Every day | ORAL | Status: DC
  Administered 2016-11-07 – 2016-11-08 (×2): 12.5 mg via ORAL
  Filled 2016-11-07 (×3): qty 0.5

## 2016-11-07 MED ORDER — DEXTROSE 5 % IV SOLN
1.0000 g | Freq: Once | INTRAVENOUS | Status: AC
  Administered 2016-11-07: 1 g via INTRAVENOUS
  Filled 2016-11-07: qty 10

## 2016-11-07 MED ORDER — ENOXAPARIN SODIUM 40 MG/0.4ML ~~LOC~~ SOLN
40.0000 mg | SUBCUTANEOUS | Status: DC
  Administered 2016-11-07 – 2016-11-08 (×2): 40 mg via SUBCUTANEOUS
  Filled 2016-11-07: qty 0.4

## 2016-11-07 MED ORDER — GABAPENTIN 300 MG PO CAPS
300.0000 mg | ORAL_CAPSULE | Freq: Two times a day (BID) | ORAL | Status: DC
  Administered 2016-11-07 – 2016-11-08 (×3): 300 mg via ORAL
  Filled 2016-11-07 (×3): qty 1

## 2016-11-07 MED ORDER — DEXTROSE 5 % IV SOLN
500.0000 mg | Freq: Once | INTRAVENOUS | Status: AC
  Administered 2016-11-07: 500 mg via INTRAVENOUS
  Filled 2016-11-07: qty 500

## 2016-11-07 MED ORDER — MAGNESIUM SULFATE 2 GM/50ML IV SOLN
2.0000 g | Freq: Once | INTRAVENOUS | Status: AC
  Administered 2016-11-07: 2 g via INTRAVENOUS
  Filled 2016-11-07: qty 50

## 2016-11-07 MED ORDER — ASPIRIN EC 81 MG PO TBEC
81.0000 mg | DELAYED_RELEASE_TABLET | Freq: Every day | ORAL | Status: DC
  Administered 2016-11-07 – 2016-11-08 (×2): 81 mg via ORAL
  Filled 2016-11-07 (×2): qty 1

## 2016-11-07 MED ORDER — PANTOPRAZOLE SODIUM 40 MG PO TBEC
40.0000 mg | DELAYED_RELEASE_TABLET | Freq: Every day | ORAL | Status: DC
  Administered 2016-11-07 – 2016-11-08 (×2): 40 mg via ORAL
  Filled 2016-11-07 (×2): qty 1

## 2016-11-07 MED ORDER — IPRATROPIUM-ALBUTEROL 0.5-2.5 (3) MG/3ML IN SOLN
3.0000 mL | Freq: Four times a day (QID) | RESPIRATORY_TRACT | Status: DC
  Administered 2016-11-07 – 2016-11-08 (×5): 3 mL via RESPIRATORY_TRACT
  Filled 2016-11-07 (×5): qty 3

## 2016-11-07 MED ORDER — PREDNISONE 20 MG PO TABS
40.0000 mg | ORAL_TABLET | Freq: Every day | ORAL | Status: DC
  Administered 2016-11-07 – 2016-11-08 (×2): 40 mg via ORAL
  Filled 2016-11-07 (×2): qty 2

## 2016-11-07 MED ORDER — METOPROLOL SUCCINATE ER 50 MG PO TB24
50.0000 mg | ORAL_TABLET | Freq: Every morning | ORAL | Status: DC
  Administered 2016-11-07 – 2016-11-08 (×2): 50 mg via ORAL
  Filled 2016-11-07 (×2): qty 1

## 2016-11-07 NOTE — ED Notes (Signed)
Blood cultures x 2 drawn by Tori with lab.

## 2016-11-07 NOTE — ED Notes (Signed)
Pt O2 remained around 93 while ambulating, but then decreased to 88 when we arrived back to room, pt had some SOB, steady gait.

## 2016-11-07 NOTE — Progress Notes (Signed)
FOLLOW UP NOTE FROM EARLIER ADMISSION TODAY:  66 yo male with hx of COPD on home oxygen, prior tobacco abuse, HLD, HTN, DM, no insurance coverage, admitted for CAP and hypokalemia.   He was started on Rocephin and ZIthromax, and given K supplement.  He was given IV Steroid, and now oral prednisone.  On exam, he has some wheezing, but is in no respiratory distress.   We will continue with current Tx.  He wants to go home today, but he is not ready for discharge. Will re evaluate him tomorrow to see if it is possible.  Houston SirenPeter Yesmin Mutch MD FACP.  Hospitalist.

## 2016-11-07 NOTE — H&P (Signed)
History and Physical   Jeffery FiscalMichael L Mealey WUX:[id number] DOB: 1950-08-03 DOA: 11/06/2016  Referring MD/NP/PA: Laurey ArrowSteven Rancour, MD, EDP PCP: Toma DeitersHasanaj, Xaje A, MD  Patient coming from: Home  Chief Complaint: Shortness of breath  HPI: Jeffery FiscalMichael L [name] is a 66 y.o. male with a history of tobacco use, 2L oxygen-dependent COPD, T2DM, HTN, HLD, and GERD who presented to the ED for dyspnea and cough.   He noticed gradual onset of worsening, constant dyspnea on Sunday morning at church, where he is a [employer], that was associated with productive cough and right-sided chest pain. Maintenance inhaler and nebulizer treatments helped symptoms temporarily, but when they returned his wife brought him to the ED.   ED Course: On arrival he was tachypneic and tachycardic. CXR demonstrated dense RML consolidation, WBC 17.3, potassium 2.9. BNP, troponin, lactic acid, and age-adjusted d-dimer were normal. ECG nonischemic. After continuous breathing treatment, IV solumedrol, and antibiotics he remains dyspneic above baseline and desaturated to 88% on his home 2L of oxygen with ambulation.    Review of Systems: Denies fever, chills, hemoptysis, leg swelling, orthopnea, chest pain, palpitations, abd pain, N/V/D, dysuria, and per HPI. All others reviewed and are negative.   Past Medical History:  Diagnosis Date  . Chest pain 04/2011   Normal echocardiogram  . COPD (chronic obstructive pulmonary disease) (HCC)   . Fasting hyperglycemia    Normal hemoglobin A1c of 6.0  . GERD (gastroesophageal reflux disease)   . Hyperlipemia   . Hypertension   . On home O2    2L N/C  . Prediabetes 06/04/2015  . Tobacco abuse    Past Surgical History:  Procedure Laterality Date  . APPENDECTOMY    . APPENDECTOMY    . LEFT HEART CATHETERIZATION WITH CORONARY ANGIOGRAM N/A 05/17/2011   Procedure: LEFT HEART CATHETERIZATION WITH CORONARY ANGIOGRAM;  Surgeon: Tonny BollmanMichael Cooper, MD;  [facility];  Service: Cardiovascular;   Laterality: N/A;   - Smoked for at least 30 years, 1 ppd, stopped a few years ago. Has been on oxygen for < 1 year. Lives with wife, active in church. No EtOH or other drugs.  Allergies  Allergen Reactions  . Codeine Nausea Only   Family History  Problem Relation Age of Onset  . Emphysema Father    - Family history otherwise reviewed and not pertinent.  Prior to Admission medications   Medication Sig Start Date End Date Taking? Authorizing Provider  albuterol (PROVENTIL HFA;VENTOLIN HFA) 108 (90 Base) MCG/ACT inhaler Inhale 2 puffs into the lungs every 4 (four) hours as needed for wheezing or shortness of breath. 03/10/16   Samuel JesterMcManus, Kathleen, DO  albuterol (PROVENTIL HFA;VENTOLIN HFA) 108 (90 Base) MCG/ACT inhaler Inhale 1-2 puffs into the lungs every 6 (six) hours as needed for wheezing or shortness of breath. Patient not taking: Reported on 08/26/2016 04/23/16   Long, Arlyss RepressJoshua G, MD  albuterol (PROVENTIL) (2.5 MG/3ML) 0.083% nebulizer solution Take 3 mLs (2.5 mg total) by nebulization every 4 (four) hours as needed for wheezing or shortness of breath. 03/10/16   Samuel JesterMcManus, Kathleen, DO  ALPRAZolam Prudy Feeler(XANAX) 0.5 MG tablet Take 0.5 mg by mouth at bedtime.    [provider]  aspirin EC 81 MG tablet Take 81 mg by mouth daily.    [provider]  azithromycin (ZITHROMAX) 250 MG tablet Take 1 tablet (250 mg total) by mouth daily. Take first 2 tablets together, then 1 every day until finished. Patient not taking: Reported on 08/26/2016 04/23/16   Long, Arlyss RepressJoshua G,  MD  budesonide-formoterol (SYMBICORT) 160-4.5 MCG/ACT inhaler Inhale 2 puffs into the lungs 2 (two) times daily.    [provider]  chlorthalidone (HYGROTON) 25 MG tablet Take 12.5 mg by mouth daily.     [provider]  gabapentin (NEURONTIN) 300 MG capsule Take 300 mg by mouth 2 (two) times daily.  03/22/13   [provider]  ipratropium-albuterol (DUONEB) 0.5-2.5 (3) MG/3ML SOLN Inhale 3 mLs into  the lungs every 6 (six) hours as needed (for shortness of breath/wheezing).  07/17/14   [provider]  isosorbide mononitrate (IMDUR) 30 MG 24 hr tablet Take 30 mg by mouth daily. 03/22/14   [provider]  metFORMIN (GLUCOPHAGE) 500 MG tablet Take 500 mg by mouth daily.    [provider]  metoprolol succinate (TOPROL-XL) 50 MG 24 hr tablet Take 50 mg by mouth every morning. Take with or immediately following a meal.    [provider]  multivitamin (ONE-A-DAY MEN'S) TABS Take 1 tablet by mouth daily.      [provider]  omeprazole (PRILOSEC) 20 MG capsule Take 20 mg by mouth daily.  05/26/15   [provider]  OXYGEN Inhale 2 L into the lungs daily as needed.     [provider]  polyethylene glycol (MIRALAX / GLYCOLAX) packet Take 17 g by mouth daily as needed for mild constipation.    [provider]  potassium chloride SA (K-DUR,KLOR-CON) 20 MEQ tablet Take 20 mEq by mouth daily.  05/12/14   [provider]  predniSONE (DELTASONE) 20 MG tablet Take 1 tablet (20 mg total) by mouth daily with breakfast. 08/26/16   Donnetta Hutching, MD  ranitidine (ZANTAC) 300 MG tablet Take 300 mg by mouth at bedtime.    [provider]  simvastatin (ZOCOR) 20 MG tablet Take 20 mg by mouth at bedtime.  03/22/14   [provider]    Physical Exam: Vitals:   11/07/16 0030 11/07/16 0100 11/07/16 0130 11/07/16 0230  BP: 138/84 126/83 (!) 142/75 (!) 147/93  Pulse: (!) 124 (!) 126 (!) 109 (!) 114  Resp: (!) 22 (!) 25 18 (!) 23  Temp:      TempSrc:      SpO2: 100% 94% 95% 93%  Weight:      Height:       Constitutional: 66 y.o. male in no distress, calm demeanor Eyes: Lids and conjunctivae normal, PERRL ENMT: Mucous membranes are moist. Posterior pharynx clear of any exudate or lesions. Poor dentition.  Neck: normal, supple, no masses, no thyromegaly Respiratory: Tachypneic without accessory muscle use. Very  diminished breath sounds throughout, no wheezes or crackles. Cardiovascular: Regular rate and rhythm, no murmurs, rubs, or gallops. No carotid bruits. No JVD. No LE edema. 2+ pedal pulses. Abdomen: Normoactive bowel sounds. No tenderness, non-distended, and no masses palpated. No hepatosplenomegaly. GU: No indwelling catheter Musculoskeletal: No clubbing / cyanosis. No joint deformity upper and lower extremities. Good ROM, no contractures. Normal muscle tone. Right anterolateral chest tender to palpation without ecchymosis/erythema or palpable deformity. Skin: Warm, dry. No rashes, wounds, no ulcers.  Neurologic: CN II-XII grossly intact. Speech normal. No focal deficits in motor strength or sensation in all extremities.  Psychiatric: Alert and oriented x3. Normal judgment and insight. Mood euthymic with congruent affect.   Labs on Admission: I have personally reviewed following labs and imaging studies  CBC:  Recent Labs Lab 11/06/16 2325  WBC 17.3*  NEUTROABS 13.7*  HGB 12.8*  HCT 37.6*  MCV 84.5  PLT 400   Basic Metabolic Panel:  Recent Labs Lab 11/06/16 2325  NA 130*  K 2.9*  CL 90*  CO2 29  GLUCOSE 148*  BUN 7  CREATININE 0.73  CALCIUM 9.3   GFR: Estimated Creatinine Clearance: 72.7 mL/min (by C-G formula based on SCr of 0.73 mg/dL). Liver Function Tests:  Recent Labs Lab 11/06/16 2325  AST 20  ALT 16*  ALKPHOS 87  BILITOT 0.8  PROT 7.2  ALBUMIN 3.7   No results for input(s): LIPASE, AMYLASE in the last 168 hours. No results for input(s): AMMONIA in the last 168 hours. Coagulation Profile: No results for input(s): INR, PROTIME in the last 168 hours. Cardiac Enzymes:  Recent Labs Lab 11/06/16 2325  TROPONINI <0.03   BNP (last 3 results) No results for input(s): PROBNP in the last 8760 hours. HbA1C: No results for input(s): HGBA1C in the last 72 hours. CBG: No results for input(s): GLUCAP in the last 168 hours. Lipid Profile: No results for  input(s): CHOL, HDL, LDLCALC, TRIG, CHOLHDL, LDLDIRECT in the last 72 hours. Thyroid Function Tests: No results for input(s): TSH, T4TOTAL, FREET4, T3FREE, THYROIDAB in the last 72 hours. Anemia Panel: No results for input(s): VITAMINB12, FOLATE, FERRITIN, TIBC, IRON, RETICCTPCT in the last 72 hours. Urine analysis:    Component Value Date/Time   COLORURINE YELLOW 04/12/2011 1548   APPEARANCEUR CLEAR 04/12/2011 1548   LABSPEC >1.030 (H) 04/12/2011 1548   PHURINE 6.0 04/12/2011 1548   GLUCOSEU NEGATIVE 04/12/2011 1548   HGBUR NEGATIVE 04/12/2011 1548   BILIRUBINUR SMALL (A) 04/12/2011 1548   KETONESUR 15 (A) 04/12/2011 1548   PROTEINUR NEGATIVE 04/12/2011 1548   UROBILINOGEN 0.2 04/12/2011 1548   NITRITE NEGATIVE 04/12/2011 1548   LEUKOCYTESUR NEGATIVE 04/12/2011 1548    Recent Results (from the past 240 hour(s))  Blood culture (routine x 2)     Status: None (Preliminary result)   Collection Time: 11/07/16 12:55 AM  Result Value Ref Range Status   Specimen Description BLOOD RIGHT ARM  Final   Special Requests   Final    BOTTLES DRAWN AEROBIC AND ANAEROBIC Blood Culture adequate volume   Culture PENDING  Incomplete   Report Status PENDING  Incomplete  Blood culture (routine x 2)     Status: None (Preliminary result)   Collection Time: 11/07/16  1:10 AM  Result Value Ref Range Status   Specimen Description LEFT ANTECUBITAL  Final   Special Requests   Final    BOTTLES DRAWN AEROBIC AND ANAEROBIC Blood Culture adequate volume   Culture PENDING  Incomplete   Report Status PENDING  Incomplete     Radiological Exams on Admission: Dg Chest 2 View  Result Date: 11/07/2016 CLINICAL DATA:  Initial evaluation for acute shortness of breath, right-sided chest pain with nonproductive cough. EXAM: CHEST  2 VIEW COMPARISON:  Prior radiograph from 08/26/2016. FINDINGS: Cardiac and mediastinal silhouettes are stable in size and contour, and remain within normal limits. Mild aortic  atherosclerosis. Lungs normally inflated. Underlying emphysema. Dense consolidation within the right middle lobe, concerning for infiltrate given history of cough. No other focal airspace disease. No pulmonary edema or pleural effusion. No pneumothorax. No acute osseous abnormality. IMPRESSION: 1. Dense right middle lobe consolidation, concerning for pneumonia. Radiographic follow-up to resolution in 4-6 weeks recommended. 2. Emphysema. Electronically Signed   By: Rise Mu M.D.   On: 11/07/2016 00:48    EKG: Independently reviewed. Sinus tachycardia, normal axis. Significant diffuse artifact, but no consistent ST  depression/elevation.   Assessment/Plan Active Problems:   COPD exacerbation (HCC)   Essential hypertension   Right middle lobe pneumonia (HCC)   Acute on chronic hypoxic respiratory failure: Due to pneumonia causing COPD exacerbation.  - Treat conditions as below, hope to wean back to home 2L by [place]  RML community-acquired pneumonia: CXR: Dense RML infiltrate. Treat empirically for typical bacterial pathogens. - Urine pneumococcus, legionella antigens - Blood and sputum cultures - HIV antibody - Azithromycin and ceftriaxone  - Incentive spirometry  COPD with mild acute exacerbation:  - Solumedrol given in ED, will continue prednisone burst - Duonebs q6h, albuterol q3h prn  T2DM:  - Hold metformin - Carb-modified diet - CBGs AC/HS to monitor for steroid-induced hyperglycemia, sensitive SSI coverage as needed - Continue ASA, statin as well. ?not on ACE inhibitor  HTN: Chronic, stable - Continue home medications: HCTZ, imdur, metoprolol  Hyperlipidemia: Chronic, stable - Continue statin  Hypokalemia: In setting of continuous albuterol, also has a history of this. - Repleted with Mg in ED.  - Recheck, continue home daily K.  DVT prophylaxis: Lovenox  Code Status: Full   Family Communication: None at bedside in ED Disposition Plan: Anticipate DC home once  stable Consults called: None  Admission status: Observation    Hazeline Junker, MD [facility] Pager [phone]  If 7PM-7AM, please contact night-coverage [email or web address] Password [email or web address] 11/07/2016, 2:45 AM

## 2016-11-07 NOTE — Care Management Note (Signed)
Case Management Note  Patient Details  Name: Carolyne FiscalMichael L Burdine MRN: [id number] Date of Birth: [birth year]  Subjective/Objective:                  Pt from home, lives with wife. He is ind with ADL's and enjoys mowing his yard regularly. He has no insurance, has PCP, drives himself to appointments and pays for his medications OOP. He has home oxygen and neb machine. He communicates no needs at DC.   Action/Plan: Plan on return home with self care. Nd CM needs anticipated. Will follow until DC.  Expected Discharge Date:                  Expected Discharge Plan:  Home/Self Care  In-House Referral:  NA  Discharge planning Services  CM Consult  Post Acute Care Choice:  NA Choice offered to:  NA  Status of Service:  Completed, signed off  Malcolm MetroChildress, Torrey Ballinas Demske, RN 11/07/2016, 12:46 PM

## 2016-11-08 DIAGNOSIS — J181 Lobar pneumonia, unspecified organism: Secondary | ICD-10-CM

## 2016-11-08 DIAGNOSIS — J441 Chronic obstructive pulmonary disease with (acute) exacerbation: Secondary | ICD-10-CM

## 2016-11-08 DIAGNOSIS — J9621 Acute and chronic respiratory failure with hypoxia: Secondary | ICD-10-CM

## 2016-11-08 LAB — STREP PNEUMONIAE URINARY ANTIGEN: STREP PNEUMO URINARY ANTIGEN: NEGATIVE

## 2016-11-08 LAB — GLUCOSE, CAPILLARY
GLUCOSE-CAPILLARY: 116 mg/dL — AB (ref 65–99)
Glucose-Capillary: 138 mg/dL — ABNORMAL HIGH (ref 65–99)

## 2016-11-08 LAB — HIV ANTIBODY (ROUTINE TESTING W REFLEX): HIV Screen 4th Generation wRfx: NONREACTIVE

## 2016-11-08 MED ORDER — LEVOFLOXACIN 750 MG PO TABS: 750.0000 mg | ORAL_TABLET | Freq: Every day | ORAL | 0 refills | Status: AC

## 2016-11-08 MED ORDER — PREDNISONE 20 MG PO TABS: 5.0000 mg | ORAL_TABLET | Freq: Every day | ORAL | 1 refills | Status: DC

## 2016-11-08 NOTE — Discharge Summary (Signed)
Physician Discharge Summary  Jeffery [name] ZOX:[id number] DOB: 06-02-1950 DOA: 11/06/2016  PCP: Toma Deiters, MD  Admit date: 11/06/2016 Discharge date: 11/08/2016  Admitted From: Home.  Disposition:  Home.   Recommendations for Outpatient Follow-up:  1. Follow up with PCP in 1-2 weeks  Home Health:  None needed.  Equipment/Devices: Home oxygen.  Discharge Condition: No SOB.  Good Sat.  CODE STATUS: FULL CODE.  Diet recommendation:  Cardiac and carb modified diet.   Brief/Interim Summary: Patient was admitted into the hospital by Dr Jarvis Newcomer on November 07, 2016.  As per his H and P:  " Jeffery [name] is a 66 y.o. male with a history of tobacco use, 2L oxygen-dependent COPD, T2DM, HTN, HLD, and GERD who presented to the ED for dyspnea and cough.   He noticed gradual onset of worsening, constant dyspnea on Sunday morning at church, where he is a [employer]ento, that was associated with productive cough and right-sided chest pain. Maintenance inhaler and nebulizer treatments helped symptoms temporarily, but when they returned his wife brought him to the ED.   ED Course: On arrival he was tachypneic and tachycardic. CXR demonstrated dense RML consolidation, WBC 17.3, potassium 2.9. BNP, troponin, lactic acid, and age-adjusted d-dimer were normal. ECG nonischemic. After continuous breathing treatment, IV solumedrol, and antibiotics he remains dyspneic above baseline and desaturated to 88% on his home 2L of oxygen with ambulation.    Review of Systems: Denies fever, chills, hemoptysis, leg swelling, orthopnea, chest pain, palpitations, abd pain, N/V/D, dysuria, and per HPI. All others reviewed and are negative.   HOSPITAL COURSE:   Patient was admitted, and was given IV Rocephin and ZIthromax, along with hsi baseline oxygen at 2L [place].  He has been on chronic steroid for COPD, and his prednisone was bumped up to 20mg  per day.  He wanted to go home the next morning, but was agreeable to stay a full day for  observation and further Tx.  He continued to feel well, had minimal coughs, and was not SOB.  His K was low and he was supplemented.  He also was continued on his BP meds, but his diuretic was discontinued because he had low K, and didn't need it for HTN only.   He is now ready for discharge.  He will reduce his Prednisone to baseline at 5mg  per day, and finish his antibiotic course with levoquin 750mg  per day for 10 days.  He will follow up with his PCP next week.    Discharge Diagnoses:  Principal Problem:   Right middle lobe pneumonia (HCC) Active Problems:   Hypertension   Hyperlipidemia   COPD exacerbation (HCC)   Essential hypertension   Acute on chronic respiratory failure with hypoxia [facility])   Discharge Instructions:    Discharge Instructions    Diet - low sodium heart healthy    Complete by:  As directed    Discharge instructions    Complete by:  As directed    Follow up with your PCP next week.   Increase activity slowly    Complete by:  As directed      Allergies as of 11/08/2016      Reactions   Codeine Nausea Only      Medication List    STOP taking these medications   chlorthalidone 25 MG tablet Commonly known as:  HYGROTON   ranitidine 300 MG tablet Commonly known as:  ZANTAC     TAKE these medications   albuterol 108 (90  Base) MCG/ACT inhaler Commonly known as:  PROVENTIL HFA;VENTOLIN HFA Inhale 2 puffs into the lungs every 4 (four) hours as needed for wheezing or shortness of breath.   ALPRAZolam 0.5 MG tablet Commonly known as:  XANAX Take 0.5 mg by mouth at bedtime.   aspirin EC 81 MG tablet Take 81 mg by mouth daily.   budesonide-formoterol 160-4.5 MCG/ACT inhaler Commonly known as:  SYMBICORT Inhale 2 puffs into the lungs 2 (two) times daily.   gabapentin 300 MG capsule Commonly known as:  NEURONTIN Take 300 mg by mouth 2 (two) times daily.   ipratropium-albuterol 0.5-2.5 (3) MG/3ML Soln Commonly known as:  DUONEB Inhale 3 mLs into the  lungs every 6 (six) hours as needed (for shortness of breath/wheezing).   isosorbide mononitrate 30 MG 24 hr tablet Commonly known as:  IMDUR Take 30 mg by mouth daily.   levofloxacin 750 MG tablet Commonly known as:  LEVAQUIN Take 1 tablet (750 mg total) by mouth daily.   metFORMIN 500 MG tablet Commonly known as:  GLUCOPHAGE Take 500 mg by mouth daily.   metoprolol succinate 50 MG 24 hr tablet Commonly known as:  TOPROL-XL Take 50 mg by mouth every morning. Take with or immediately following a meal.   multivitamin Tabs tablet Take 1 tablet by mouth daily.   omeprazole 20 MG capsule Commonly known as:  PRILOSEC Take 20 mg by mouth daily.   OXYGEN Inhale 2 L into the lungs daily as needed.   polyethylene glycol packet Commonly known as:  MIRALAX / GLYCOLAX Take 17 g by mouth daily as needed for mild constipation.   potassium chloride SA 20 MEQ tablet Commonly known as:  K-DUR,KLOR-CON Take 20 mEq by mouth daily.   predniSONE 5 MG tablet Commonly known as:  DELTASONE Take 0.5 tablets (10 mg total) by mouth daily with breakfast. What changed:  how much to take   simvastatin 20 MG tablet Commonly known as:  ZOCOR Take 20 mg by mouth at bedtime.       Allergies  Allergen Reactions  . Codeine Nausea Only    Consultations:  NONE>    Procedures/Studies: Dg Chest 2 View  Result Date: 11/07/2016 CLINICAL DATA:  Initial evaluation for acute shortness of breath, right-sided chest pain with nonproductive cough. EXAM: CHEST  2 VIEW COMPARISON:  Prior radiograph from 08/26/2016. FINDINGS: Cardiac and mediastinal silhouettes are stable in size and contour, and remain within normal limits. Mild aortic atherosclerosis. Lungs normally inflated. Underlying emphysema. Dense consolidation within the right middle lobe, concerning for infiltrate given history of cough. No other focal airspace disease. No pulmonary edema or pleural effusion. No pneumothorax. No acute osseous  abnormality. IMPRESSION: 1. Dense right middle lobe consolidation, concerning for pneumonia. Radiographic follow-up to resolution in 4-6 weeks recommended. 2. Emphysema. Electronically Signed   By: Rise MuBenjamin  McClintock M.D.   On: 11/07/2016 00:48    Subjective: Feeling well and really wanted to go home today.   Discharge Exam: Vitals:   11/07/16 2134 11/08/16 0455  BP: 122/74 134/77  Pulse: 89 93  Resp: 20 18  Temp: 98.6 F (37 C) 98.1 F (36.7 C)   Vitals:   11/08/16 0123 11/08/16 0455 11/08/16 0806 11/08/16 0814  BP:  134/77    Pulse:  93    Resp:  18    Temp:  98.1 F (36.7 C)    TempSrc:  Oral    SpO2: 94% 93% (!) 86% 94%  Weight:      Height:  General: Pt is alert, awake, not in acute distress Cardiovascular: RRR, S1/S2 +, no rubs, no gallops Respiratory: CTA bilaterally, no wheezing, no rhonchi Abdominal: Soft, NT, ND, bowel sounds + Extremities: no edema, no cyanosis    The results of significant diagnostics from this hospitalization (including imaging, microbiology, ancillary and laboratory) are listed below for reference.     Microbiology: Recent Results (from the past 240 hour(s))  Blood culture (routine x 2)     Status: None (Preliminary result)   Collection Time: 11/07/16 12:55 AM  Result Value Ref Range Status   Specimen Description BLOOD RIGHT ARM  Final   Special Requests   Final    BOTTLES DRAWN AEROBIC AND ANAEROBIC Blood Culture adequate volume   Culture NO GROWTH 1 DAY  Final   Report Status PENDING  Incomplete  Blood culture (routine x 2)     Status: None (Preliminary result)   Collection Time: 11/07/16  1:10 AM  Result Value Ref Range Status   Specimen Description LEFT ANTECUBITAL  Final   Special Requests   Final    BOTTLES DRAWN AEROBIC AND ANAEROBIC Blood Culture adequate volume   Culture NO GROWTH 1 DAY  Final   Report Status PENDING  Incomplete      Recent Labs  11/06/16 2325  BNP 14.0   Basic Metabolic Panel:  Recent  Labs Lab 11/06/16 2325 11/07/16 0716  NA 130* 134*  K 2.9* 3.4*  CL 90* 94*  CO2 29 28  GLUCOSE 148* 270*  BUN 7 8  CREATININE 0.73 0.81  CALCIUM 9.3 9.0   Liver Function Tests:  Recent Labs Lab 11/06/16 2325  AST 20  ALT 16*  ALKPHOS 87  BILITOT 0.8  PROT 7.2  ALBUMIN 3.7   CBC:  Recent Labs Lab 11/06/16 2325 11/07/16 0716  WBC 17.3* 15.6*  NEUTROABS 13.7*  --   HGB 12.8* 11.9*  HCT 37.6* 36.0*  MCV 84.5 85.1  PLT 400 419*   Cardiac Enzymes:  Recent Labs Lab 11/06/16 2325  TROPONINI <0.03   BNP: Invalid input(s): POCBNP CBG:  Recent Labs Lab 11/07/16 1116 11/07/16 1643 11/07/16 2132 11/08/16 0742 11/08/16 1159  GLUCAP 169* 154* 200* 116* 138*   D-Dimer  Recent Labs  11/07/16 0004  DDIMER 0.61*   Urinalysis    Component Value Date/Time   COLORURINE YELLOW 04/12/2011 1548   APPEARANCEUR CLEAR 04/12/2011 1548   LABSPEC >1.030 (H) 04/12/2011 1548   PHURINE 6.0 04/12/2011 1548   GLUCOSEU NEGATIVE 04/12/2011 1548   HGBUR NEGATIVE 04/12/2011 1548   BILIRUBINUR SMALL (A) 04/12/2011 1548   KETONESUR 15 (A) 04/12/2011 1548   PROTEINUR NEGATIVE 04/12/2011 1548   UROBILINOGEN 0.2 04/12/2011 1548   NITRITE NEGATIVE 04/12/2011 1548   LEUKOCYTESUR NEGATIVE 04/12/2011 1548   Sepsis Labs Invalid input(s): PROCALCITONIN,  WBC,  LACTICIDVEN Microbiology Recent Results (from the past 240 hour(s))  Blood culture (routine x 2)     Status: None (Preliminary result)   Collection Time: 11/07/16 12:55 AM  Result Value Ref Range Status   Specimen Description BLOOD RIGHT ARM  Final   Special Requests   Final    BOTTLES DRAWN AEROBIC AND ANAEROBIC Blood Culture adequate volume   Culture NO GROWTH 1 DAY  Final   Report Status PENDING  Incomplete  Blood culture (routine x 2)     Status: None (Preliminary result)   Collection Time: 11/07/16  1:10 AM  Result Value Ref Range Status   Specimen Description LEFT ANTECUBITAL  Final  Special Requests    Final    BOTTLES DRAWN AEROBIC AND ANAEROBIC Blood Culture adequate volume   Culture NO GROWTH 1 DAY  Final   Report Status PENDING  Incomplete    Time coordinating discharge: Over 30 minutes SIGNED:  Houston Siren, MD FACP [facility] 11/08/2016, 1:47 PM   If 7PM-7AM, please contact night-coverage [email or web address] Password [email or web address]

## 2016-11-08 NOTE — Progress Notes (Signed)
Patient states understanding of discharge instructions, prescriptions given 

## 2016-11-09 LAB — LEGIONELLA PNEUMOPHILA SEROGP 1 UR AG: L. pneumophila Serogp 1 Ur Ag: NEGATIVE

## 2016-11-12 LAB — CULTURE, BLOOD (ROUTINE X 2)
CULTURE: NO GROWTH
Culture: NO GROWTH
Special Requests: ADEQUATE
Special Requests: ADEQUATE

## 2017-04-02 ENCOUNTER — Encounter (HOSPITAL_COMMUNITY): Payer: Self-pay | Admitting: Emergency Medicine

## 2017-04-02 ENCOUNTER — Other Ambulatory Visit: Payer: Self-pay

## 2017-04-02 ENCOUNTER — Emergency Department (HOSPITAL_COMMUNITY): Payer: Self-pay

## 2017-04-02 ENCOUNTER — Emergency Department (HOSPITAL_COMMUNITY)
Admission: EM | Admit: 2017-04-02 | Discharge: 2017-04-02 | Disposition: A | Discharge status: Home / Self Care | Payer: Self-pay | Attending: Emergency Medicine | Admitting: Emergency Medicine

## 2017-04-02 DIAGNOSIS — R05 Cough: Secondary | ICD-10-CM | POA: Insufficient documentation

## 2017-04-02 DIAGNOSIS — R059 Cough, unspecified: Secondary | ICD-10-CM

## 2017-04-02 DIAGNOSIS — I10 Essential (primary) hypertension: Secondary | ICD-10-CM | POA: Insufficient documentation

## 2017-04-02 DIAGNOSIS — R079 Chest pain, unspecified: Secondary | ICD-10-CM | POA: Insufficient documentation

## 2017-04-02 DIAGNOSIS — J449 Chronic obstructive pulmonary disease, unspecified: Secondary | ICD-10-CM | POA: Insufficient documentation

## 2017-04-02 DIAGNOSIS — Z79899 Other long term (current) drug therapy: Secondary | ICD-10-CM | POA: Insufficient documentation

## 2017-04-02 LAB — CBC
HCT: 39.7 % (ref 39.0–52.0)
Hemoglobin: 12.6 g/dL — ABNORMAL LOW (ref 13.0–17.0)
MCH: 29.2 pg (ref 26.0–34.0)
MCHC: 31.7 g/dL (ref 30.0–36.0)
MCV: 91.9 fL (ref 78.0–100.0)
PLATELETS: 282 10*3/uL (ref 150–400)
RBC: 4.32 MIL/uL (ref 4.22–5.81)
RDW: 13.6 % (ref 11.5–15.5)
WBC: 5.6 10*3/uL (ref 4.0–10.5)

## 2017-04-02 LAB — BASIC METABOLIC PANEL
ANION GAP: 9 (ref 5–15)
BUN: 10 mg/dL (ref 6–20)
CHLORIDE: 94 mmol/L — AB (ref 101–111)
CO2: 33 mmol/L — AB (ref 22–32)
CREATININE: 0.77 mg/dL (ref 0.61–1.24)
Calcium: 9.5 mg/dL (ref 8.9–10.3)
GFR calc Af Amer: >60 mL/min (ref >60)
GFR calc non Af Amer: >60 mL/min (ref >60)
Glucose, Bld: 130 mg/dL — ABNORMAL HIGH (ref 65–99)
Potassium: 3.2 mmol/L — ABNORMAL LOW (ref 3.5–5.1)
Sodium: 136 mmol/L (ref 135–145)

## 2017-04-02 LAB — I-STAT TROPONIN, ED
Troponin i, poc: 0 ng/mL (ref 0.00–0.08)
Troponin i, poc: 0 ng/mL (ref 0.00–0.08)

## 2017-04-02 MED ORDER — BENZONATATE 100 MG PO CAPS: 100.0000 mg | ORAL_CAPSULE | Freq: Three times a day (TID) | ORAL | 0 refills | Status: DC | PRN

## 2017-04-02 NOTE — ED Provider Notes (Signed)
[facility] ([place]) EMERGENCY DEPARTMENT Provider Note   CSN: [id number] Arrival date & time: 04/02/17  1432     History   Chief Complaint Chief Complaint  Patient presents with  . Chest Pain    HPI Jeffery [name] is a 66 y.o. male.  HPI Patient presents to the emergency room for evaluation of intermittent right-sided chest pain that started a few days ago.  Patient has a history of COPD.  He denies any history of heart disease or myocardial infarction.  Patient states he has had some intermittent pain in the right side of his chest that lasts a few minutes at a time.  He has history of chronic COPD and does occasionally get short of breath but denies having any new shortness of breath with these episodes.  He denies any leg swelling.  No fevers.  Patient mentioned this to family members today and they suggested he come to the emergency room to get checked out. Past Medical History:  Diagnosis Date  . Chest pain 04/2011   Normal echocardiogram  . COPD (chronic obstructive pulmonary disease) (HCC)   . Fasting hyperglycemia    Normal hemoglobin A1c of 6.0  . GERD (gastroesophageal reflux disease)   . Hyperlipemia   . Hypertension   . On home O2    2L [place]  . Prediabetes 06/04/2015  . Tobacco abuse     Patient Active Problem List   Diagnosis Date Noted  . Right middle lobe pneumonia (HCC) 11/07/2016  . Acute on chronic respiratory failure with hypoxia (HCC) 11/07/2016  . Prediabetes 06/04/2015  . Hyperglycemia, drug-induced 06/03/2015  . Essential hypertension 06/03/2015  . COPD exacerbation (HCC) 06/02/2015  . Hyperlipidemia 07/20/2011  . Fasting hyperglycemia   . Hypertension   . Tobacco abuse   . Chest pain 04/02/2011    Past Surgical History:  Procedure Laterality Date  . APPENDECTOMY    . APPENDECTOMY    . LEFT HEART CATHETERIZATION WITH CORONARY ANGIOGRAM N/A 05/17/2011   Procedure: LEFT HEART CATHETERIZATION WITH CORONARY ANGIOGRAM;  Surgeon: Tonny Bollman, MD;   Location: [facility] CATH LAB;  Service: Cardiovascular;  Laterality: N/A;       Home Medications    Prior to Admission medications   Medication Sig Start Date End Date Taking? Authorizing Provider  albuterol (PROVENTIL HFA;VENTOLIN HFA) 108 (90 Base) MCG/ACT inhaler Inhale 2 puffs into the lungs every 4 (four) hours as needed for wheezing or shortness of breath. 03/10/16  Yes Samuel Jester, DO  ALPRAZolam Prudy Feeler) 0.5 MG tablet Take 0.5 mg by mouth at bedtime.   Yes [provider]  aspirin EC 81 MG tablet Take 81 mg by mouth daily.   Yes [provider]  budesonide-formoterol (SYMBICORT) 160-4.5 MCG/ACT inhaler Inhale 2 puffs into the lungs 2 (two) times daily.   Yes [provider]  chlorthalidone (HYGROTON) 25 MG tablet Take 25 mg by mouth daily.   Yes [provider]  gabapentin (NEURONTIN) 300 MG capsule Take 300 mg by mouth 2 (two) times daily.  03/22/13  Yes [provider]  ipratropium-albuterol (DUONEB) 0.5-2.5 (3) MG/3ML SOLN Inhale 3 mLs into the lungs every 6 (six) hours as needed (for shortness of breath/wheezing).  07/17/14  Yes [provider]  isosorbide mononitrate (IMDUR) 30 MG 24 hr tablet Take 30 mg by mouth daily. 03/22/14  Yes [provider]  metFORMIN (GLUCOPHAGE) 500 MG tablet Take 500 mg by mouth daily.   Yes [provider]  metoprolol succinate (TOPROL-XL) 50 MG  24 hr tablet Take 50 mg by mouth every morning. Take with or immediately following a meal.   Yes [provider]  multivitamin (ONE-A-DAY MEN'S) TABS Take 1 tablet by mouth daily.     Yes [provider]  omeprazole (PRILOSEC) 20 MG capsule Take 20 mg by mouth 2 (two) times daily.  05/26/15  Yes [provider]  OXYGEN Inhale 2 L into the lungs daily as needed.    Yes [provider]  polyethylene glycol (MIRALAX / GLYCOLAX) packet Take 17 g by mouth daily as needed for mild constipation.   Yes [provider]  potassium chloride SA (K-DUR,KLOR-CON) 20 MEQ tablet Take 20 mEq by mouth daily.  05/12/14  Yes [provider]  predniSONE (DELTASONE) 20 MG tablet Take 0.5 tablets (10 mg total) by mouth daily with breakfast. Patient taking differently: Take 20 mg by mouth daily with breakfast.  11/08/16  Yes Houston SirenLe, Peter, MD  ranitidine (ZANTAC) 150 MG tablet Take 150 mg by mouth 2 (two) times daily.   Yes [provider]  simvastatin (ZOCOR) 20 MG tablet Take 20 mg by mouth at bedtime.  03/22/14  Yes [provider]  benzonatate (TESSALON) 100 MG capsule Take 1 capsule (100 mg total) by mouth 3 (three) times daily as needed for cough. 04/02/17   Linwood DibblesKnapp, Demorris Choyce, MD    Family History Family History  Problem Relation Age of Onset  . Emphysema Father     Social History Social History   Tobacco Use  . Smoking status: Former Smoker    Packs/day: 1.00    Years: 35.00    Pack years: 35.00    Types: Cigarettes    Last attempt to quit: 05/09/2013    Years since quitting: 3.9  . Smokeless tobacco: Never Used  Substance Use Topics  . Alcohol use: No  . Drug use: No     Allergies   Codeine   Review of Systems Review of Systems  All other systems reviewed and are negative.    Physical Exam Updated Vital Signs BP (!) 155/92   Pulse 73   Temp 98.1 F (36.7 C) (Oral)   Resp 14   Ht 1.702 m (5\' 7" )   Wt 57.6 kg (127 lb)   SpO2 100%   BMI 19.89 kg/m   Physical Exam  Constitutional:  Non-toxic appearance. He does not appear ill. No distress.  HENT:  Head: Normocephalic and atraumatic.  Right Ear: External ear normal.  Left Ear: External ear normal.  Eyes: Conjunctivae are normal. Right eye exhibits no discharge. Left eye exhibits no discharge. No scleral icterus.  Neck: Neck supple. No tracheal deviation present.  Cardiovascular: Normal rate, regular rhythm and intact distal pulses.  Pulmonary/Chest: Effort normal and breath sounds normal. No stridor. No  respiratory distress. He has no wheezes. He has no rales.  Abdominal: Soft. Bowel sounds are normal. He exhibits no distension. There is no tenderness. There is no rebound and no guarding.  Musculoskeletal: He exhibits no edema or tenderness.  Neurological: He is alert. He has normal strength. No cranial nerve deficit (no facial droop, extraocular movements intact, no slurred speech) or sensory deficit. He exhibits normal muscle tone. He displays no seizure activity. Coordination normal.  Skin: Skin is warm and dry. No rash noted.  Psychiatric: He has a normal mood and affect.  Nursing note and vitals reviewed.    ED Treatments / Results  Labs (all labs ordered are listed, but only abnormal results  are displayed) Labs Reviewed  BASIC METABOLIC PANEL - Abnormal; Notable for the following components:      Result Value   Potassium 3.2 (*)    Chloride 94 (*)    CO2 33 (*)    Glucose, Bld 130 (*)    All other components within normal limits  CBC - Abnormal; Notable for the following components:   Hemoglobin 12.6 (*)    All other components within normal limits  I-STAT TROPONIN, ED  I-STAT TROPONIN, ED    EKG  EKG Interpretation  Date/Time:  Sunday April 02 2017 14:44:53 EST Ventricular Rate:  84 PR Interval:    QRS Duration: 103 QT Interval:  431 QTC Calculation: 510 R Axis:   73 Text Interpretation:  Sinus rhythm Prolonged QT interval , new since last tracing Confirmed by Nelva Hauk ([id number]) on 04/02/2017 2:48:47 PM       Radiology Dg Chest 2 View  Result Date: 04/02/2017 CLINICAL DATA:  Left chest pain for several hours.  COPD. EXAM: CHEST  2 VIEW COMPARISON:  11/07/2016 FINDINGS: The heart size and mediastinal contours are within normal limits. Previously seen right middle lobe consolidation has nearly completely resolved, with mild residual scarring. No evidence of acute infiltrate or edema. No evidence of pneumothorax or pleural effusion. Pulmonary emphysema again  noted. The visualized skeletal structures are unremarkable. IMPRESSION: Emphysema.  No active lung disease. Electronically Signed   By: John  Stahl M.D.   On: 04/02/2017 16:14    Procedures Procedures (including critical care time)  Medications Ordered in ED Medications - No data to display   Initial Impression / Assessment and Plan / ED Course  I have reviewed the triage vital signs and the nursing notes.  Pertinent labs & imaging results that were available during my care of the patient were reviewed by me and considered in my medical decision making (see chart for details).   Patient presents to the emergency room for evaluation of intermittent right-sided chest pain.  Patient symptoms are atypical for coronary artery disease.  Serial troponins are negative.  EKG is reassuring.  Patient is not feeling short of breath.  The chest x-ray does not show pneumonia.  I doubt pulmonary embolism.  At this time there does not appear to be any evidence of an acute emergency medical condition and the patient appears stable for discharge with appropriate outpatient follow up.   Final Clinical Impressions(s) / ED Diagnoses   Final diagnoses:  Chest pain, unspecified type  Cough    ED Discharge Orders        Ordered    benzonatate (TESSALON) 100 MG capsule  3 times daily PRN     12 /02/18 1820       Linwood DibblesKnapp, Jaylyne Breese, MD 04/02/17 [id number]

## 2017-04-02 NOTE — Discharge Instructions (Signed)
You can take the medication for cough as needed, follow-up with your doctor next week if you continue to have any symptoms, take Tylenol as needed for pain

## 2017-04-02 NOTE — ED Triage Notes (Signed)
Patient c/o right side chest pain, intermittent, non-radiating that started yesterday. Denies any shortness of breath, nausea, vomiting, dizziness, or fevers. Patient states occasional cough. Patient wearing 2 liters of oxygen via [place] in which he wears at all times. HX of COPD and MI. Patient states pain originally started while sitting down.

## 2017-05-02 ENCOUNTER — Emergency Department (HOSPITAL_COMMUNITY)
Admission: EM | Admit: 2017-05-02 | Discharge: 2017-05-02 | Disposition: A | Discharge status: Home / Self Care | Payer: Self-pay | Attending: Emergency Medicine | Admitting: Emergency Medicine

## 2017-05-02 ENCOUNTER — Encounter (HOSPITAL_COMMUNITY): Payer: Self-pay

## 2017-05-02 ENCOUNTER — Emergency Department (HOSPITAL_COMMUNITY): Payer: Self-pay

## 2017-05-02 DIAGNOSIS — R232 Flushing: Secondary | ICD-10-CM | POA: Insufficient documentation

## 2017-05-02 DIAGNOSIS — R6883 Chills (without fever): Secondary | ICD-10-CM | POA: Insufficient documentation

## 2017-05-02 DIAGNOSIS — Z79899 Other long term (current) drug therapy: Secondary | ICD-10-CM | POA: Insufficient documentation

## 2017-05-02 DIAGNOSIS — J449 Chronic obstructive pulmonary disease, unspecified: Secondary | ICD-10-CM | POA: Insufficient documentation

## 2017-05-02 DIAGNOSIS — Z87891 Personal history of nicotine dependence: Secondary | ICD-10-CM | POA: Insufficient documentation

## 2017-05-02 DIAGNOSIS — I10 Essential (primary) hypertension: Secondary | ICD-10-CM | POA: Insufficient documentation

## 2017-05-02 DIAGNOSIS — E876 Hypokalemia: Secondary | ICD-10-CM | POA: Insufficient documentation

## 2017-05-02 LAB — CBC WITH DIFFERENTIAL/PLATELET
Basophils Absolute: 0 10*3/uL (ref 0.0–0.1)
Basophils Relative: 0 %
EOS ABS: 0.8 10*3/uL — AB (ref 0.0–0.7)
Eosinophils Relative: 11 %
HCT: 41.4 % (ref 39.0–52.0)
Hemoglobin: 13.3 g/dL (ref 13.0–17.0)
LYMPHS ABS: 1.5 10*3/uL (ref 0.7–4.0)
LYMPHS PCT: 19 %
MCH: 29.1 pg (ref 26.0–34.0)
MCHC: 32.1 g/dL (ref 30.0–36.0)
MCV: 90.6 fL (ref 78.0–100.0)
Monocytes Absolute: 0.9 10*3/uL (ref 0.1–1.0)
Monocytes Relative: 11 %
NEUTROS ABS: 4.5 10*3/uL (ref 1.7–7.7)
NEUTROS PCT: 59 %
Platelets: 314 10*3/uL (ref 150–400)
RBC: 4.57 MIL/uL (ref 4.22–5.81)
RDW: 13.5 % (ref 11.5–15.5)
WBC: 7.6 10*3/uL (ref 4.0–10.5)

## 2017-05-02 LAB — URINALYSIS, ROUTINE W REFLEX MICROSCOPIC
Bacteria, UA: NONE SEEN
Bilirubin Urine: NEGATIVE
Glucose, UA: NEGATIVE mg/dL
Hgb urine dipstick: NEGATIVE
Ketones, ur: NEGATIVE mg/dL
Leukocytes, UA: NEGATIVE
Nitrite: NEGATIVE
PH: 8 (ref 5.0–8.0)
Protein, ur: 30 mg/dL — AB
SQUAMOUS EPITHELIAL / LPF: NONE SEEN
Specific Gravity, Urine: 1.014 (ref 1.005–1.030)

## 2017-05-02 LAB — COMPREHENSIVE METABOLIC PANEL
ALT: 16 U/L — ABNORMAL LOW (ref 17–63)
AST: 24 U/L (ref 15–41)
Albumin: 3.9 g/dL (ref 3.5–5.0)
Alkaline Phosphatase: 77 U/L (ref 38–126)
Anion gap: 11 (ref 5–15)
BUN: 7 mg/dL (ref 6–20)
CHLORIDE: 95 mmol/L — AB (ref 101–111)
CO2: 29 mmol/L (ref 22–32)
Calcium: 9.4 mg/dL (ref 8.9–10.3)
Creatinine, Ser: 0.74 mg/dL (ref 0.61–1.24)
Glucose, Bld: 108 mg/dL — ABNORMAL HIGH (ref 65–99)
POTASSIUM: 2.9 mmol/L — AB (ref 3.5–5.1)
SODIUM: 135 mmol/L (ref 135–145)
Total Bilirubin: 0.5 mg/dL (ref 0.3–1.2)
Total Protein: 6.8 g/dL (ref 6.5–8.1)

## 2017-05-02 LAB — TROPONIN I

## 2017-05-02 MED ORDER — POTASSIUM CHLORIDE CRYS ER 20 MEQ PO TBCR
40.0000 meq | EXTENDED_RELEASE_TABLET | Freq: Once | ORAL | Status: AC
  Administered 2017-05-02: 40 meq via ORAL
  Filled 2017-05-02: qty 2

## 2017-05-02 MED ORDER — ACETAMINOPHEN 500 MG PO TABS
1000.0000 mg | ORAL_TABLET | Freq: Once | ORAL | Status: AC
Start: 2017-05-02 — End: 2017-05-02
  Administered 2017-05-02: 1000 mg via ORAL
  Filled 2017-05-02: qty 2

## 2017-05-02 NOTE — ED Triage Notes (Signed)
Pt reports having hot flashes since yesterday. Unable to sleep last night due to feeling so hot.  Denies any pain.  Has copd and wears o2.  C/O feeling a little sob.

## 2017-05-02 NOTE — ED Notes (Signed)
Pt transported to Xray. 

## 2017-05-02 NOTE — Discharge Instructions (Signed)
Double your potassium dose for 1 week starting tomorrow.  Return if worse.

## 2017-05-02 NOTE — ED Provider Notes (Signed)
[facility] EMERGENCY DEPARTMENT Provider Note   CSN: [id number] Arrival date & time: 05/02/17  7829     History   Chief Complaint Chief Complaint  Patient presents with  . hot flashes    HPI Jeffery [name] is a 67 y.o. male.  Pt presents to the ED today with hot flashes.  He said he was at church bringing in the new year when he started feeling bad.  He would feel hot, then cold.  He was unable to sleep due to the feelings.  Pt denies any pain.  He is not sob.       Past Medical History:  Diagnosis Date  . Chest pain 04/2011   Normal echocardiogram  . COPD (chronic obstructive pulmonary disease) (HCC)   . Fasting hyperglycemia    Normal hemoglobin A1c of 6.0  . GERD (gastroesophageal reflux disease)   . Hyperlipemia   . Hypertension   . On home O2    2L [place]  . Prediabetes 06/04/2015  . Tobacco abuse     Patient Active Problem List   Diagnosis Date Noted  . Right middle lobe pneumonia (HCC) 11/07/2016  . Acute on chronic respiratory failure with hypoxia (HCC) 11/07/2016  . Prediabetes 06/04/2015  . Hyperglycemia, drug-induced 06/03/2015  . Essential hypertension 06/03/2015  . COPD exacerbation (HCC) 06/02/2015  . Hyperlipidemia 07/20/2011  . Fasting hyperglycemia   . Hypertension   . Tobacco abuse   . Chest pain 04/02/2011    Past Surgical History:  Procedure Laterality Date  . APPENDECTOMY    . APPENDECTOMY    . LEFT HEART CATHETERIZATION WITH CORONARY ANGIOGRAM N/A 05/17/2011   Procedure: LEFT HEART CATHETERIZATION WITH CORONARY ANGIOGRAM;  Surgeon: Tonny Bollman, MD;  Location: [facility] CATH LAB;  Service: Cardiovascular;  Laterality: N/A;       Home Medications    Prior to Admission medications   Medication Sig Start Date End Date Taking? Authorizing Provider  albuterol (PROVENTIL HFA;VENTOLIN HFA) 108 (90 Base) MCG/ACT inhaler Inhale 2 puffs into the lungs every 4 (four) hours as needed for wheezing or shortness of breath. 03/10/16  Yes Samuel Jester, DO  ALPRAZolam Prudy Feeler) 0.5 MG tablet Take 0.5 mg by mouth at bedtime.   Yes [provider]  aspirin EC 81 MG tablet Take 81 mg by mouth daily.   Yes [provider]  budesonide-formoterol (SYMBICORT) 160-4.5 MCG/ACT inhaler Inhale 2 puffs into the lungs 2 (two) times daily.   Yes [provider]  chlorthalidone (HYGROTON) 25 MG tablet Take 25 mg by mouth daily.   Yes [provider]  gabapentin (NEURONTIN) 300 MG capsule Take 300 mg by mouth 2 (two) times daily.  03/22/13  Yes [provider]  ipratropium-albuterol (DUONEB) 0.5-2.5 (3) MG/3ML SOLN Inhale 3 mLs into the lungs every 6 (six) hours as needed (for shortness of breath/wheezing).  07/17/14  Yes [provider]  isosorbide mononitrate (IMDUR) 30 MG 24 hr tablet Take 30 mg by mouth daily. 03/22/14  Yes [provider]  metFORMIN (GLUCOPHAGE) 500 MG tablet Take 500 mg by mouth daily.   Yes [provider]  metoprolol succinate (TOPROL-XL) 50 MG 24 hr tablet Take 50 mg by mouth every morning. Take with or immediately following a meal.   Yes [provider]  multivitamin (ONE-A-DAY MEN'S) TABS Take 1 tablet by mouth daily.     Yes [provider]  omeprazole (PRILOSEC) 20 MG capsule Take 20 mg by mouth 2 (two) times daily.  05/26/15  Yes [provider]  OXYGEN Inhale 2 L into the lungs daily as needed.    Yes [provider]  polyethylene glycol (MIRALAX / GLYCOLAX) packet Take 17 g by mouth daily as needed for mild constipation.   Yes [provider]  potassium chloride SA (K-DUR,KLOR-CON) 20 MEQ tablet Take 20 mEq by mouth daily.  05/12/14  Yes [provider]  predniSONE (DELTASONE) 20 MG tablet Take 0.5 tablets (10 mg total) by mouth daily with breakfast. Patient taking differently: Take 20 mg by mouth daily with breakfast.  11/08/16  Yes Houston Siren, MD  ranitidine (ZANTAC) 150 MG tablet Take 150 mg by mouth 2  (two) times daily.   Yes [provider]  simvastatin (ZOCOR) 20 MG tablet Take 20 mg by mouth at bedtime.  03/22/14  Yes [provider]  benzonatate (TESSALON) 100 MG capsule Take 1 capsule (100 mg total) by mouth 3 (three) times daily as needed for cough. Patient not taking: Reported on 05/02/2017 04/02/17   Linwood Dibbles, MD    Family History Family History  Problem Relation Age of Onset  . Emphysema Father     Social History Social History   Tobacco Use  . Smoking status: Former Smoker    Packs/day: 1.00    Years: 35.00    Pack years: 35.00    Types: Cigarettes    Last attempt to quit: 05/09/2013    Years since quitting: 3.9  . Smokeless tobacco: Never Used  Substance Use Topics  . Alcohol use: No  . Drug use: No     Allergies   Codeine   Review of Systems Review of Systems  Constitutional: Positive for chills.     Physical Exam Updated Vital Signs BP (!) 151/95   Pulse 80   Temp 98.4 F (36.9 C) (Oral)   Resp 16   Ht 5\' 7"  (1.702 m)   Wt 57.6 kg (127 lb)   SpO2 98%   BMI 19.89 kg/m   Physical Exam  Constitutional: He is oriented to person, place, and time. He appears well-developed and well-nourished.  HENT:  Head: Normocephalic and atraumatic.  Right Ear: External ear normal.  Left Ear: External ear normal.  Nose: Nose normal.  Mouth/Throat: Oropharynx is clear and moist.  Eyes: Conjunctivae and EOM are normal. Pupils are equal, round, and reactive to light.  Neck: Normal range of motion. Neck supple.  Cardiovascular: Normal rate, regular rhythm, normal heart sounds and intact distal pulses.  Pulmonary/Chest: Effort normal and breath sounds normal.  Abdominal: Soft. Bowel sounds are normal.  Musculoskeletal: Normal range of motion.  Neurological: He is alert and oriented to person, place, and time.  Skin: Skin is warm and dry. Capillary refill takes less than 2 seconds.  Psychiatric: He has a normal mood and affect. His behavior is  normal. Judgment and thought content normal.  Nursing note and vitals reviewed.    ED Treatments / Results  Labs (all labs ordered are listed, but only abnormal results are displayed) Labs Reviewed  COMPREHENSIVE METABOLIC PANEL - Abnormal; Notable for the following components:      Result Value   Potassium 2.9 (*)    Chloride 95 (*)    Glucose, Bld 108 (*)    ALT 16 (*)    All other components within normal limits  CBC WITH DIFFERENTIAL/PLATELET - Abnormal; Notable for the following components:   Eosinophils Absolute 0.8 (*)    All other components within normal limits  URINALYSIS, ROUTINE W REFLEX MICROSCOPIC - Abnormal; Notable for the following components:   Protein, ur 30 (*)    All other components within normal limits  TROPONIN I    EKG  EKG Interpretation  Date/Time:  Tuesday May 02 2017 08:15:11 EST Ventricular Rate:  71 PR Interval:    QRS Duration: 97 QT Interval:  452 QTC Calculation: 492 R Axis:   74 Text Interpretation:  Sinus rhythm Borderline prolonged QT interval No significant change since last tracing Confirmed by Jacalyn LefevreHaviland, Jelisa  [phone]([id number]) on 05/02/2017 8:26:50 AM       Radiology Dg Chest 2 View  Result Date: 05/02/2017 CLINICAL DATA:  Shortness of Breath EXAM: CHEST  2 VIEW COMPARISON:  04/02/2017 FINDINGS: Cardiac shadow is within normal limits. The lungs are hyperinflated consistent with COPD. Scarring is again noted in the right middle lobe. No acute infiltrate or sizable effusion is seen. No bony abnormality is noted. IMPRESSION: No acute abnormality seen. Electronically Signed   By: Alcide CleverMark  Lukens M.D.   On: 05/02/2017 08:03    Procedures Procedures (including critical care time)  Medications Ordered in ED Medications  potassium chloride SA (K-DUR,KLOR-CON) CR tablet 40 mEq (not administered)  acetaminophen (TYLENOL) tablet 1,000 mg (1,000 mg Oral Given 05/02/17 0747)     Initial Impression / Assessment and Plan / ED Course  I have reviewed  the triage vital signs and the nursing notes.  Pertinent labs & imaging results that were available during my care of the patient were reviewed by me and considered in my medical decision making (see chart for details).     Pt has no fever.  No PNA.  No definite etiology for his sx.  His potassium is low, so I will replace that prior to d/c and instruct him to double it for 1 week.  He is instructed to return if worse and f/u with pcp.  Final Clinical Impressions(s) / ED Diagnoses   Final diagnoses:  Chills (without fever)  Hypokalemia  Hot flashes    ED Discharge Orders    None       Jacalyn LefevreHaviland, Cystal Shannahan, MD 05/02/17 [id number]

## 2017-06-08 ENCOUNTER — Encounter (HOSPITAL_COMMUNITY): Payer: Self-pay

## 2017-06-08 ENCOUNTER — Emergency Department (HOSPITAL_COMMUNITY)
Admission: EM | Admit: 2017-06-08 | Discharge: 2017-06-08 | Disposition: A | Discharge status: Home / Self Care | Payer: Self-pay | Attending: Emergency Medicine | Admitting: Emergency Medicine

## 2017-06-08 ENCOUNTER — Emergency Department (HOSPITAL_COMMUNITY): Payer: Self-pay

## 2017-06-08 DIAGNOSIS — I10 Essential (primary) hypertension: Secondary | ICD-10-CM | POA: Insufficient documentation

## 2017-06-08 DIAGNOSIS — Z7982 Long term (current) use of aspirin: Secondary | ICD-10-CM | POA: Insufficient documentation

## 2017-06-08 DIAGNOSIS — J441 Chronic obstructive pulmonary disease with (acute) exacerbation: Secondary | ICD-10-CM | POA: Insufficient documentation

## 2017-06-08 DIAGNOSIS — Z79899 Other long term (current) drug therapy: Secondary | ICD-10-CM | POA: Insufficient documentation

## 2017-06-08 DIAGNOSIS — Z87891 Personal history of nicotine dependence: Secondary | ICD-10-CM | POA: Insufficient documentation

## 2017-06-08 LAB — BASIC METABOLIC PANEL
Anion gap: 13 (ref 5–15)
BUN: 5 mg/dL — AB (ref 6–20)
CHLORIDE: 93 mmol/L — AB (ref 101–111)
CO2: 29 mmol/L (ref 22–32)
CREATININE: 0.75 mg/dL (ref 0.61–1.24)
Calcium: 9.4 mg/dL (ref 8.9–10.3)
GFR calc Af Amer: >60 mL/min (ref >60)
GFR calc non Af Amer: >60 mL/min (ref >60)
Glucose, Bld: 104 mg/dL — ABNORMAL HIGH (ref 65–99)
Potassium: 3.8 mmol/L (ref 3.5–5.1)
SODIUM: 135 mmol/L (ref 135–145)

## 2017-06-08 LAB — CBC WITH DIFFERENTIAL/PLATELET
Basophils Absolute: 0 10*3/uL (ref 0.0–0.1)
Basophils Relative: 0 %
EOS ABS: 0.5 10*3/uL (ref 0.0–0.7)
Eosinophils Relative: 6 %
HEMATOCRIT: 41 % (ref 39.0–52.0)
HEMOGLOBIN: 13.3 g/dL (ref 13.0–17.0)
LYMPHS ABS: 0.7 10*3/uL (ref 0.7–4.0)
LYMPHS PCT: 8 %
MCH: 29 pg (ref 26.0–34.0)
MCHC: 32.4 g/dL (ref 30.0–36.0)
MCV: 89.5 fL (ref 78.0–100.0)
MONOS PCT: 9 %
Monocytes Absolute: 0.7 10*3/uL (ref 0.1–1.0)
NEUTROS ABS: 6.5 10*3/uL (ref 1.7–7.7)
NEUTROS PCT: 77 %
Platelets: 248 10*3/uL (ref 150–400)
RBC: 4.58 MIL/uL (ref 4.22–5.81)
RDW: 13.3 % (ref 11.5–15.5)
WBC: 8.4 10*3/uL (ref 4.0–10.5)

## 2017-06-08 LAB — TROPONIN I

## 2017-06-08 LAB — BRAIN NATRIURETIC PEPTIDE: B Natriuretic Peptide: 8 pg/mL (ref 0.0–100.0)

## 2017-06-08 MED ORDER — DOXYCYCLINE HYCLATE 100 MG PO TABS
100.0000 mg | ORAL_TABLET | Freq: Once | ORAL | Status: AC
  Administered 2017-06-08: 100 mg via ORAL
  Filled 2017-06-08: qty 1

## 2017-06-08 MED ORDER — OSELTAMIVIR PHOSPHATE 75 MG PO CAPS
75.0000 mg | ORAL_CAPSULE | Freq: Once | ORAL | Status: AC
  Administered 2017-06-08: 75 mg via ORAL
  Filled 2017-06-08: qty 1

## 2017-06-08 MED ORDER — PREDNISONE 20 MG PO TABS: 60.0000 mg | ORAL_TABLET | Freq: Every day | ORAL | 0 refills | Status: DC

## 2017-06-08 MED ORDER — METHYLPREDNISOLONE SODIUM SUCC 125 MG IJ SOLR
125.0000 mg | Freq: Once | INTRAMUSCULAR | Status: AC
  Administered 2017-06-08: 125 mg via INTRAVENOUS
  Filled 2017-06-08: qty 2

## 2017-06-08 MED ORDER — OSELTAMIVIR PHOSPHATE 75 MG PO CAPS: 75.0000 mg | ORAL_CAPSULE | Freq: Two times a day (BID) | ORAL | 0 refills | Status: DC

## 2017-06-08 MED ORDER — ALBUTEROL (5 MG/ML) CONTINUOUS INHALATION SOLN
15.0000 mg/h | INHALATION_SOLUTION | Freq: Once | RESPIRATORY_TRACT | Status: AC
  Administered 2017-06-08: 15 mg/h via RESPIRATORY_TRACT
  Filled 2017-06-08: qty 20

## 2017-06-08 MED ORDER — DOXYCYCLINE HYCLATE 100 MG PO CAPS: 100.0000 mg | ORAL_CAPSULE | Freq: Two times a day (BID) | ORAL | 0 refills | Status: DC

## 2017-06-08 NOTE — ED Provider Notes (Signed)
[facility] EMERGENCY DEPARTMENT Provider Note   CSN: [id number] Arrival date & time: 06/08/17  0353     History   Chief Complaint Chief Complaint  Patient presents with  . Shortness of Breath    HPI Jeffery [name] is a 67 y.o. male.  Patient presents to the emergency department for evaluation of shortness of breath.  Patient has a history of COPD.  He is on 2 L nasal cannula oxygen 24 hours a day.  He reports that he started having increased shortness of breath yesterday.  He has not had any significant cough and denies fever.  He is not experiencing chest pain.  He has been using his nebulizers but they have not been helping his shortness of breath.      Past Medical History:  Diagnosis Date  . Chest pain 04/2011   Normal echocardiogram  . COPD (chronic obstructive pulmonary disease) (HCC)   . Fasting hyperglycemia    Normal hemoglobin A1c of 6.0  . GERD (gastroesophageal reflux disease)   . Hyperlipemia   . Hypertension   . On home O2    2L N/C  . Prediabetes 06/04/2015  . Tobacco abuse     Patient Active Problem List   Diagnosis Date Noted  . Right middle lobe pneumonia (HCC) 11/07/2016  . Acute on chronic respiratory failure with hypoxia (HCC) 11/07/2016  . Prediabetes 06/04/2015  . Hyperglycemia, drug-induced 06/03/2015  . Essential hypertension 06/03/2015  . COPD exacerbation (HCC) 06/02/2015  . Hyperlipidemia 07/20/2011  . Fasting hyperglycemia   . Hypertension   . Tobacco abuse   . Chest pain 04/02/2011    Past Surgical History:  Procedure Laterality Date  . APPENDECTOMY    . APPENDECTOMY    . LEFT HEART CATHETERIZATION WITH CORONARY ANGIOGRAM N/A 05/17/2011   Procedure: LEFT HEART CATHETERIZATION WITH CORONARY ANGIOGRAM;  Surgeon: Tonny BollmanMichael Cooper, MD;  [facility];  Service: Cardiovascular;  Laterality: N/A;       Home Medications    Prior to Admission medications   Medication Sig Start Date End Date Taking? Authorizing Provider    albuterol (PROVENTIL HFA;VENTOLIN HFA) 108 (90 Base) MCG/ACT inhaler Inhale 2 puffs into the lungs every 4 (four) hours as needed for wheezing or shortness of breath. 03/10/16   Samuel JesterMcManus, Kathleen, DO  ALPRAZolam Prudy Feeler(XANAX) 0.5 MG tablet Take 0.5 mg by mouth at bedtime.    [provider]  aspirin EC 81 MG tablet Take 81 mg by mouth daily.    [provider]  benzonatate (TESSALON) 100 MG capsule Take 1 capsule (100 mg total) by mouth 3 (three) times daily as needed for cough. Patient not taking: Reported on 05/02/2017 04/02/17   Linwood DibblesKnapp, Jon, MD  budesonide-formoterol [facility](SYMBICORT) 160-4.5 MCG/ACT inhaler Inhale 2 puffs into the lungs 2 (two) times daily.    [provider]  chlorthalidone (HYGROTON) 25 MG tablet Take 25 mg by mouth daily.    [provider]  doxycycline (VIBRAMYCIN) 100 MG capsule Take 1 capsule (100 mg total) by mouth 2 (two) times daily. 06/08/17   Gilda CreasePollina, Amonte Brookover J, MD  gabapentin (NEURONTIN) 300 MG capsule Take 300 mg by mouth 2 (two) times daily.  03/22/13   [provider]  ipratropium-albuterol (DUONEB) 0.5-2.5 (3) MG/3ML SOLN Inhale 3 mLs into the lungs every 6 (six) hours as needed (for shortness of breath/wheezing).  07/17/14   [provider]  isosorbide mononitrate (IMDUR) 30 MG 24 hr tablet Take 30 mg by mouth daily. 03/22/14  [provider]  metFORMIN (GLUCOPHAGE) 500 MG tablet Take 500 mg by mouth daily.    [provider]  metoprolol succinate (TOPROL-XL) 50 MG 24 hr tablet Take 50 mg by mouth every morning. Take with or immediately following a meal.    [provider]  multivitamin (ONE-A-DAY MEN'S) TABS Take 1 tablet by mouth daily.      [provider]  omeprazole (PRILOSEC) 20 MG capsule Take 20 mg by mouth 2 (two) times daily.  05/26/15   [provider]  oseltamivir (TAMIFLU) 75 MG capsule Take 1 capsule (75 mg total) by mouth every 12 (twelve) hours. 06/08/17   Gilda Crease, MD  OXYGEN Inhale 2 L into the lungs daily as needed.     [provider]  polyethylene glycol (MIRALAX / GLYCOLAX) packet Take 17 g by mouth daily as needed for mild constipation.    [provider]  potassium chloride SA (K-DUR,KLOR-CON) 20 MEQ tablet Take 20 mEq by mouth daily.  05/12/14   [provider]  predniSONE (DELTASONE) 20 MG tablet Take 3 tablets (60 mg total) by mouth daily with breakfast. 06/08/17   El Pile, Canary Brim, MD  ranitidine (ZANTAC) 150 MG tablet Take 150 mg by mouth 2 (two) times daily.    [provider]  simvastatin (ZOCOR) 20 MG tablet Take 20 mg by mouth at bedtime.  03/22/14   [provider]    Family History Family History  Problem Relation Age of Onset  . Emphysema Father     Social History Social History   Tobacco Use  . Smoking status: Former Smoker    Packs/day: 1.00    Years: 35.00    Pack years: 35.00    Types: Cigarettes    Last attempt to quit: 05/09/2013    Years since quitting: 4.0  . Smokeless tobacco: Never Used  Substance Use Topics  . Alcohol use: No  . Drug use: No     Allergies   Codeine   Review of Systems Review of Systems  Respiratory: Positive for shortness of breath. Negative for cough.   Cardiovascular: Negative for chest pain and leg swelling.  All other systems reviewed and are negative.    Physical Exam Updated Vital Signs BP (!) 159/84   Pulse (!) 132   Temp (!) 100.4 F (38 C) (Oral)   Resp (!) 28   Ht 5\' 7"  (1.702 m)   Wt 57.6 kg (127 lb)   SpO2 100%   BMI 19.89 kg/m   Physical Exam  Constitutional: He is oriented to person, place, and time. He appears well-developed and well-nourished. No distress.  HENT:  Head: Normocephalic and atraumatic.  Right Ear: Hearing normal.  Left Ear: Hearing normal.  Nose: Nose normal.  Mouth/Throat: Oropharynx is clear and moist and mucous membranes are normal.  Eyes: Conjunctivae and EOM are normal.  Pupils are equal, round, and reactive to light.  Neck: Normal range of motion. Neck supple.  Cardiovascular: Regular rhythm, S1 normal and S2 normal. Exam reveals no gallop and no friction rub.  No murmur heard. Pulmonary/Chest: Effort normal. No respiratory distress. He has decreased breath sounds. He exhibits no tenderness.  Abdominal: Soft. Normal appearance and bowel sounds are normal. There is no hepatosplenomegaly. There is no tenderness. There is no rebound, no guarding, no tenderness at McBurney's point and negative Murphy's sign. No hernia.  Musculoskeletal: Normal range of motion.  Neurological: He is alert and oriented to person, place, and time.  He has normal strength. No cranial nerve deficit or sensory deficit. Coordination normal. GCS eye subscore is 4. GCS verbal subscore is 5. GCS motor subscore is 6.  Skin: Skin is warm, dry and intact. No rash noted. No cyanosis.  Psychiatric: He has a normal mood and affect. His speech is normal and behavior is normal. Thought content normal.  Nursing note and vitals reviewed.    ED Treatments / Results  Labs (all labs ordered are listed, but only abnormal results are displayed) Labs Reviewed  BASIC METABOLIC PANEL - Abnormal; Notable for the following components:      Result Value   Chloride 93 (*)    Glucose, Bld 104 (*)    BUN 5 (*)    All other components within normal limits  CBC WITH DIFFERENTIAL/PLATELET  TROPONIN I  BRAIN NATRIURETIC PEPTIDE    EKG  EKG Interpretation  Date/Time:  Thursday June 08 2017 04:04:13 EST Ventricular Rate:  111 PR Interval:    QRS Duration: 106 QT Interval:  337 QTC Calculation: 458 R Axis:   78 Text Interpretation:  Sinus tachycardia Biatrial enlargement Borderline repolarization abnormality No significant change since last tracing Confirmed by Gilda Crease [phone]) on 06/08/2017 4:09:11 AM       Radiology Dg Chest Port 1 View  Result Date: 06/08/2017 CLINICAL DATA:   Acute onset of shortness of breath. EXAM: PORTABLE CHEST 1 VIEW COMPARISON:  Chest radiograph performed 05/02/2017 FINDINGS: Mild right basilar opacity is stable from prior studies and may reflect chronic scarring. No definite acute airspace consolidation is seen. No pleural effusion or pneumothorax is seen The cardiomediastinal silhouette is normal in size. No acute osseous abnormalities are identified. IMPRESSION: No acute cardiopulmonary process seen. Electronically Signed   By: Roanna Raider M.D.   On: 06/08/2017 04:43    Procedures Procedures (including critical care time)  Medications Ordered in ED Medications  methylPREDNISolone sodium succinate (SOLU-MEDROL) 125 mg/2 mL injection 125 mg (125 mg Intravenous Given 06/08/17 0444)  albuterol (PROVENTIL,VENTOLIN) solution continuous neb (15 mg/hr Nebulization Given 06/08/17 0448)  oseltamivir (TAMIFLU) capsule 75 mg (75 mg Oral Given 06/08/17 0556)  doxycycline (VIBRA-TABS) tablet 100 mg (100 mg Oral Given 06/08/17 0556)     Initial Impression / Assessment and Plan / ED Course  I have reviewed the triage vital signs and the nursing notes.  Pertinent labs & imaging results that were available during my care of the patient were reviewed by me and considered in my medical decision making (see chart for details).     Patient presents to the emergency department for evaluation of shortness of breath.  Patient has a history of COPD.  He reports that he started having shortness of breath yesterday.  He was wheezing and exhibiting decreased air movement at arrival.  He was not in significant distress.  His oxygen saturation on his normal 2 L by nasal cannula was 95-100%.  Patient was noted to have a low-grade fever.  Chest x-ray does not show pneumonia.  Remainder of his blood work was normal.  Patient treated with Solu-Medrol and continuous nebulizer treatment.  Air movement has significantly improved.  He is now experiencing slight wheezing but reports  that his breathing is much better.  As he is significantly improved, feels that his breathing is back to his normal baseline will be appropriate for outpatient management.  Continue prednisone burst, albuterol as needed.  Patient will empirically be treated with Tamiflu.  Because of his COPD will also be given  doxycycline.  Follow-up with primary care physician in 1-2 days, return if symptoms worsen.  Final Clinical Impressions(s) / ED Diagnoses   Final diagnoses:  COPD exacerbation [facility])    ED Discharge Orders        Ordered    oseltamivir (TAMIFLU) 75 MG capsule  Every 12 hours     06/08/17 0544    doxycycline (VIBRAMYCIN) 100 MG capsule  2 times daily     06/08/17 0544    predniSONE (DELTASONE) 20 MG tablet  Daily with breakfast     06/08/17 0544       Gilda Crease, MD 06/08/17 [phone]

## 2017-06-08 NOTE — ED Triage Notes (Signed)
COPD, states breathing worse since yesterday, using nebs without relief.   Pt denies pain

## 2017-07-01 DIAGNOSIS — J9611 Chronic respiratory failure with hypoxia: Secondary | ICD-10-CM | POA: Diagnosis not present

## 2017-07-01 DIAGNOSIS — J449 Chronic obstructive pulmonary disease, unspecified: Secondary | ICD-10-CM | POA: Diagnosis not present

## 2017-07-21 DIAGNOSIS — K838 Other specified diseases of biliary tract: Secondary | ICD-10-CM | POA: Diagnosis not present

## 2017-07-21 DIAGNOSIS — R1084 Generalized abdominal pain: Secondary | ICD-10-CM | POA: Diagnosis not present

## 2017-07-21 DIAGNOSIS — R109 Unspecified abdominal pain: Secondary | ICD-10-CM | POA: Diagnosis not present

## 2017-07-28 DIAGNOSIS — K219 Gastro-esophageal reflux disease without esophagitis: Secondary | ICD-10-CM | POA: Diagnosis not present

## 2017-07-28 DIAGNOSIS — F411 Generalized anxiety disorder: Secondary | ICD-10-CM | POA: Diagnosis not present

## 2017-07-28 DIAGNOSIS — E119 Type 2 diabetes mellitus without complications: Secondary | ICD-10-CM | POA: Diagnosis not present

## 2017-07-28 DIAGNOSIS — J440 Chronic obstructive pulmonary disease with acute lower respiratory infection: Secondary | ICD-10-CM | POA: Diagnosis not present

## 2017-08-01 DIAGNOSIS — J449 Chronic obstructive pulmonary disease, unspecified: Secondary | ICD-10-CM | POA: Diagnosis not present

## 2017-08-01 DIAGNOSIS — J9611 Chronic respiratory failure with hypoxia: Secondary | ICD-10-CM | POA: Diagnosis not present

## 2017-09-18 DIAGNOSIS — J440 Chronic obstructive pulmonary disease with acute lower respiratory infection: Secondary | ICD-10-CM | POA: Diagnosis not present

## 2017-09-18 DIAGNOSIS — E119 Type 2 diabetes mellitus without complications: Secondary | ICD-10-CM | POA: Diagnosis not present

## 2017-09-18 DIAGNOSIS — K219 Gastro-esophageal reflux disease without esophagitis: Secondary | ICD-10-CM | POA: Diagnosis not present

## 2017-09-18 DIAGNOSIS — Z6820 Body mass index (BMI) 20.0-20.9, adult: Secondary | ICD-10-CM | POA: Diagnosis not present

## 2017-09-18 DIAGNOSIS — E782 Mixed hyperlipidemia: Secondary | ICD-10-CM | POA: Diagnosis not present

## 2017-09-18 DIAGNOSIS — F411 Generalized anxiety disorder: Secondary | ICD-10-CM | POA: Diagnosis not present

## 2017-09-18 DIAGNOSIS — J441 Chronic obstructive pulmonary disease with (acute) exacerbation: Secondary | ICD-10-CM | POA: Diagnosis not present

## 2017-10-06 ENCOUNTER — Emergency Department (HOSPITAL_COMMUNITY)
Admission: EM | Admit: 2017-10-06 | Discharge: 2017-10-07 | Disposition: A | Discharge status: Home / Self Care | Payer: Medicare HMO | Attending: Emergency Medicine | Admitting: Emergency Medicine

## 2017-10-06 ENCOUNTER — Encounter (HOSPITAL_COMMUNITY): Payer: Self-pay | Admitting: Emergency Medicine

## 2017-10-06 ENCOUNTER — Other Ambulatory Visit: Payer: Self-pay

## 2017-10-06 DIAGNOSIS — Z79899 Other long term (current) drug therapy: Secondary | ICD-10-CM | POA: Diagnosis not present

## 2017-10-06 DIAGNOSIS — R202 Paresthesia of skin: Secondary | ICD-10-CM

## 2017-10-06 DIAGNOSIS — G9009 Other idiopathic peripheral autonomic neuropathy: Secondary | ICD-10-CM | POA: Diagnosis not present

## 2017-10-06 DIAGNOSIS — R0602 Shortness of breath: Secondary | ICD-10-CM | POA: Diagnosis not present

## 2017-10-06 DIAGNOSIS — Z7982 Long term (current) use of aspirin: Secondary | ICD-10-CM | POA: Insufficient documentation

## 2017-10-06 DIAGNOSIS — J449 Chronic obstructive pulmonary disease, unspecified: Secondary | ICD-10-CM | POA: Diagnosis not present

## 2017-10-06 DIAGNOSIS — I10 Essential (primary) hypertension: Secondary | ICD-10-CM | POA: Insufficient documentation

## 2017-10-06 DIAGNOSIS — Z7984 Long term (current) use of oral hypoglycemic drugs: Secondary | ICD-10-CM | POA: Diagnosis not present

## 2017-10-06 LAB — BASIC METABOLIC PANEL
Anion gap: 10 (ref 5–15)
BUN: 5 mg/dL — AB (ref 6–20)
CHLORIDE: 95 mmol/L — AB (ref 101–111)
CO2: 32 mmol/L (ref 22–32)
Calcium: 9.9 mg/dL (ref 8.9–10.3)
Creatinine, Ser: 0.66 mg/dL (ref 0.61–1.24)
GFR calc Af Amer: >60 mL/min (ref >60)
GLUCOSE: 93 mg/dL (ref 65–99)
POTASSIUM: 3.3 mmol/L — AB (ref 3.5–5.1)
Sodium: 137 mmol/L (ref 135–145)

## 2017-10-06 LAB — CBC
HEMATOCRIT: 39.7 % (ref 39.0–52.0)
Hemoglobin: 12.9 g/dL — ABNORMAL LOW (ref 13.0–17.0)
MCH: 28.9 pg (ref 26.0–34.0)
MCHC: 32.5 g/dL (ref 30.0–36.0)
MCV: 89 fL (ref 78.0–100.0)
Platelets: 304 10*3/uL (ref 150–400)
RBC: 4.46 MIL/uL (ref 4.22–5.81)
RDW: 13.8 % (ref 11.5–15.5)
WBC: 7.2 10*3/uL (ref 4.0–10.5)

## 2017-10-06 MED ORDER — ALBUTEROL SULFATE (2.5 MG/3ML) 0.083% IN NEBU
5.0000 mg | INHALATION_SOLUTION | Freq: Once | RESPIRATORY_TRACT | Status: DC
  Filled 2017-10-06: qty 6

## 2017-10-06 NOTE — ED Triage Notes (Addendum)
Pt C/O SOB that started earlier today. Pt is on 2L [place] at all times. Pt states when he walks short distances his SOB gets worse. Pt stating that his arms and legs "feel hot."

## 2017-10-06 NOTE — ED Provider Notes (Signed)
[facility] EMERGENCY DEPARTMENT Provider Note   CSN: [id number] Arrival date & time: 10/06/17  2134     History   Chief Complaint Chief Complaint  Patient presents with  . Shortness of Breath    HPI RENTON BERKLEY is a 67 y.o. male.  Patient is a 67 year old male with past medical history of COPD, hypertension and peripheral neuropathy.  He presents today for evaluation of a burning sensation in his arms and legs.  This began earlier this evening while in church.  He denies any weakness, any chest pain, headache, or neck pain.  He is experienced this in the past, however no definite cause was found.  The patient is somewhat of a poor historian and the history I obtained is somewhat different than that of the triage nurse.  He described shortness of breath or her, however denied this to me.  He tells me that he is here because his legs feel "hot".  He denies any redness, fever, or pain in his legs.  The history is provided by the patient.  Shortness of Breath  This is a new problem. The problem occurs intermittently.The problem has not changed since onset.Pertinent negatives include no fever, no sputum production, no chest pain, no leg pain and no leg swelling. He has tried nothing for the symptoms. Associated medical issues include COPD.    Past Medical History:  Diagnosis Date  . Chest pain 04/2011   Normal echocardiogram  . COPD (chronic obstructive pulmonary disease) (HCC)   . Fasting hyperglycemia    Normal hemoglobin A1c of 6.0  . GERD (gastroesophageal reflux disease)   . Hyperlipemia   . Hypertension   . On home O2    2L N/C  . Prediabetes 06/04/2015  . Tobacco abuse     Patient Active Problem List   Diagnosis Date Noted  . Right middle lobe pneumonia (HCC) 11/07/2016  . Acute on chronic respiratory failure with hypoxia (HCC) 11/07/2016  . Prediabetes 06/04/2015  . Hyperglycemia, drug-induced 06/03/2015  . Essential hypertension 06/03/2015  . COPD exacerbation  (HCC) 06/02/2015  . Hyperlipidemia 07/20/2011  . Fasting hyperglycemia   . Hypertension   . Tobacco abuse   . Chest pain 04/02/2011    Past Surgical History:  Procedure Laterality Date  . APPENDECTOMY    . APPENDECTOMY    . LEFT HEART CATHETERIZATION WITH CORONARY ANGIOGRAM N/A 05/17/2011   Procedure: LEFT HEART CATHETERIZATION WITH CORONARY ANGIOGRAM;  Surgeon: Tonny Bollman, MD;  Location: [facility] CATH LAB;  Service: Cardiovascular;  Laterality: N/A;        Home Medications    Prior to Admission medications   Medication Sig Start Date End Date Taking? Authorizing Provider  albuterol (PROVENTIL HFA;VENTOLIN HFA) 108 (90 Base) MCG/ACT inhaler Inhale 2 puffs into the lungs every 4 (four) hours as needed for wheezing or shortness of breath. 03/10/16   Samuel Jester, DO  ALPRAZolam Prudy Feeler) 0.5 MG tablet Take 0.5 mg by mouth at bedtime.    [provider]  aspirin EC 81 MG tablet Take 81 mg by mouth daily.    [provider]  benzonatate (TESSALON) 100 MG capsule Take 1 capsule (100 mg total) by mouth 3 (three) times daily as needed for cough. Patient not taking: Reported on 05/02/2017 04/02/17   Linwood Dibbles, MD  budesonide-formoterol [facility]) 160-4.5 MCG/ACT inhaler Inhale 2 puffs into the lungs 2 (two) times daily.    [provider]  chlorthalidone (HYGROTON) 25 MG tablet Take 25 mg by mouth  daily.    [provider]  doxycycline (VIBRAMYCIN) 100 MG capsule Take 1 capsule (100 mg total) by mouth 2 (two) times daily. 06/08/17   Gilda Crease, MD  gabapentin (NEURONTIN) 300 MG capsule Take 300 mg by mouth 2 (two) times daily.  03/22/13   [provider]  ipratropium-albuterol (DUONEB) 0.5-2.5 (3) MG/3ML SOLN Inhale 3 mLs into the lungs every 6 (six) hours as needed (for shortness of breath/wheezing).  07/17/14   [provider]  isosorbide mononitrate (IMDUR) 30 MG 24 hr tablet Take 30 mg by mouth daily. 03/22/14   [provider]  metFORMIN (GLUCOPHAGE) 500 MG tablet Take 500 mg by mouth daily.    [provider]  metoprolol succinate (TOPROL-XL) 50 MG 24 hr tablet Take 50 mg by mouth every morning. Take with or immediately following a meal.    [provider]  multivitamin (ONE-A-DAY MEN'S) TABS Take 1 tablet by mouth daily.      [provider]  omeprazole (PRILOSEC) 20 MG capsule Take 20 mg by mouth 2 (two) times daily.  05/26/15   [provider]  oseltamivir (TAMIFLU) 75 MG capsule Take 1 capsule (75 mg total) by mouth every 12 (twelve) hours. 06/08/17   Gilda Crease, MD  OXYGEN Inhale 2 L into the lungs daily as needed.     [provider]  polyethylene glycol (MIRALAX / GLYCOLAX) packet Take 17 g by mouth daily as needed for mild constipation.    [provider]  potassium chloride SA (K-DUR,KLOR-CON) 20 MEQ tablet Take 20 mEq by mouth daily.  05/12/14   [provider]  predniSONE (DELTASONE) 20 MG tablet Take 3 tablets (60 mg total) by mouth daily with breakfast. 06/08/17   Pollina, Canary Brim, MD  ranitidine (ZANTAC) 150 MG tablet Take 150 mg by mouth 2 (two) times daily.    [provider]  simvastatin (ZOCOR) 20 MG tablet Take 20 mg by mouth at bedtime.  03/22/14   [provider]    Family History Family History  Problem Relation Age of Onset  . Emphysema Father     Social History Social History   Tobacco Use  . Smoking status: Former Smoker    Packs/day: 1.00    Years: 35.00    Pack years: 35.00    Types: Cigarettes    Last attempt to quit: 05/09/2013    Years since quitting: 4.4  . Smokeless tobacco: Never Used  Substance Use Topics  . Alcohol use: No  . Drug use: No     Allergies   Codeine   Review of Systems Review of Systems  Constitutional: Negative for fever.  Respiratory: Positive for shortness of breath. Negative for sputum production.   Cardiovascular: Negative for chest  pain and leg swelling.  All other systems reviewed and are negative.    Physical Exam Updated Vital Signs BP (!) 166/92 (BP Location: Left Arm)   Pulse 80   Temp 98.5 F (36.9 C) (Oral)   Resp (!) 24   Ht 5\' 7"  (1.702 m)   Wt 56 kg (123 lb 6 oz)   SpO2 100%   BMI 19.32 kg/m   Physical Exam  Constitutional: He is oriented to person, place, and time. He appears well-developed and well-nourished. No distress.  HENT:  Head: Normocephalic and atraumatic.  Mouth/Throat: Oropharynx is clear and moist.  Neck: Normal range of motion. Neck supple.  Cardiovascular: Normal rate and regular rhythm. Exam reveals no friction  rub.  No murmur heard. Pulmonary/Chest: Effort normal and breath sounds normal. No respiratory distress. He has no wheezes. He has no rales.  Abdominal: Soft. Bowel sounds are normal. He exhibits no distension. There is no tenderness.  Musculoskeletal: Normal range of motion. He exhibits no edema.       Right lower leg: Normal. He exhibits no tenderness and no edema.       Left lower leg: Normal. He exhibits no tenderness and no edema.  Neurological: He is alert and oriented to person, place, and time. Coordination normal.  Skin: Skin is warm and dry. He is not diaphoretic.  Nursing note and vitals reviewed.    ED Treatments / Results  Labs (all labs ordered are listed, but only abnormal results are displayed) Labs Reviewed  BASIC METABOLIC PANEL - Abnormal; Notable for the following components:      Result Value   Potassium 3.3 (*)    Chloride 95 (*)    BUN 5 (*)    All other components within normal limits  CBC - Abnormal; Notable for the following components:   Hemoglobin 12.9 (*)    All other components within normal limits    ED ECG REPORT   Date: 10/06/2017  Rate: 70  Rhythm: normal sinus rhythm  QRS Axis: normal  Intervals: normal  ST/T Wave abnormalities: normal  Conduction Disutrbances:none  Narrative Interpretation:   Old EKG Reviewed:  unchanged  I have personally reviewed the EKG tracing and agree with the computerized printout as noted.   Radiology No results found.  Procedures Procedures (including critical care time)  Medications Ordered in ED Medications  albuterol (PROVENTIL) (2.5 MG/3ML) 0.083% nebulizer solution 5 mg (has no administration in time range)     Initial Impression / Assessment and Plan / ED Course  I have reviewed the triage vital signs and the nursing notes.  Pertinent labs & imaging results that were available during my care of the patient were reviewed by me and considered in my medical decision making (see chart for details).  Laboratory studies are reassuring and EKG is unremarkable.  I am uncertain as to the exact etiology of this patient's symptoms, however I suspect a flareup of his peripheral neuropathy.  He tells me that his breathing is no worse than baseline and he is saturating 100% on his normal 2 L of oxygen by nasal cannula.  I see no indication at this time for further work-up.  I will advised him to rest and see how things go through the weekend.  If he is continuing to have symptoms he is to see his primary doctor and return to the ER in the meantime if things worsen.  Final Clinical Impressions(s) / ED Diagnoses   Final diagnoses:  None    ED Discharge Orders    None       Geoffery Lyonselo, Katerine Morua, MD 10/06/17 2353

## 2017-10-06 NOTE — Discharge Instructions (Addendum)
Continue medications as previously prescribed.  Rest for the next several days.  Follow up with your primary doctor next week if symptoms are not improving.

## 2017-10-11 IMAGING — CR DG CHEST 1V PORT
1 series · 2 of 2 positions shown · non-contrast
Comparison: 06/09/2015

CLINICAL DATA: Cough, wheezing, congestion

EXAM:
PORTABLE CHEST 1 VIEW

[Series 1: ap portable · 0.17mm/px · 2 of 2 slices shown]
[im 1/2]
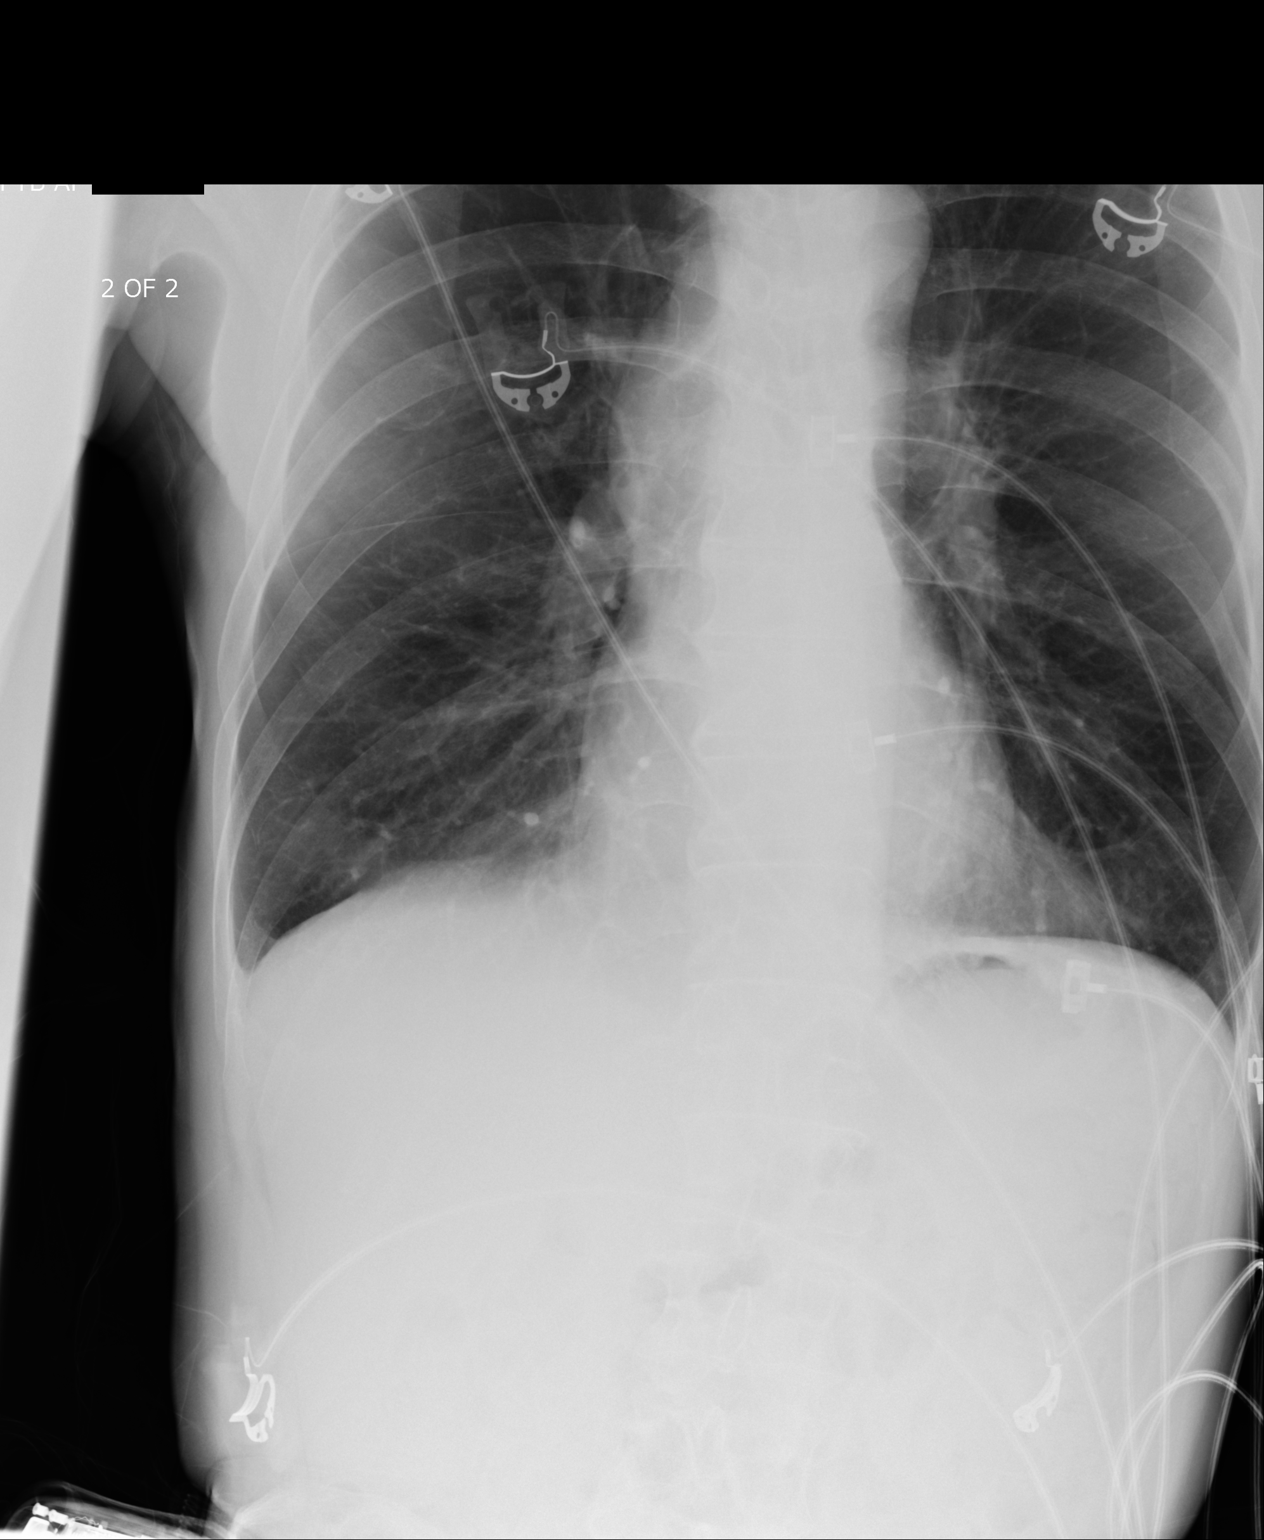
[im 2/2]
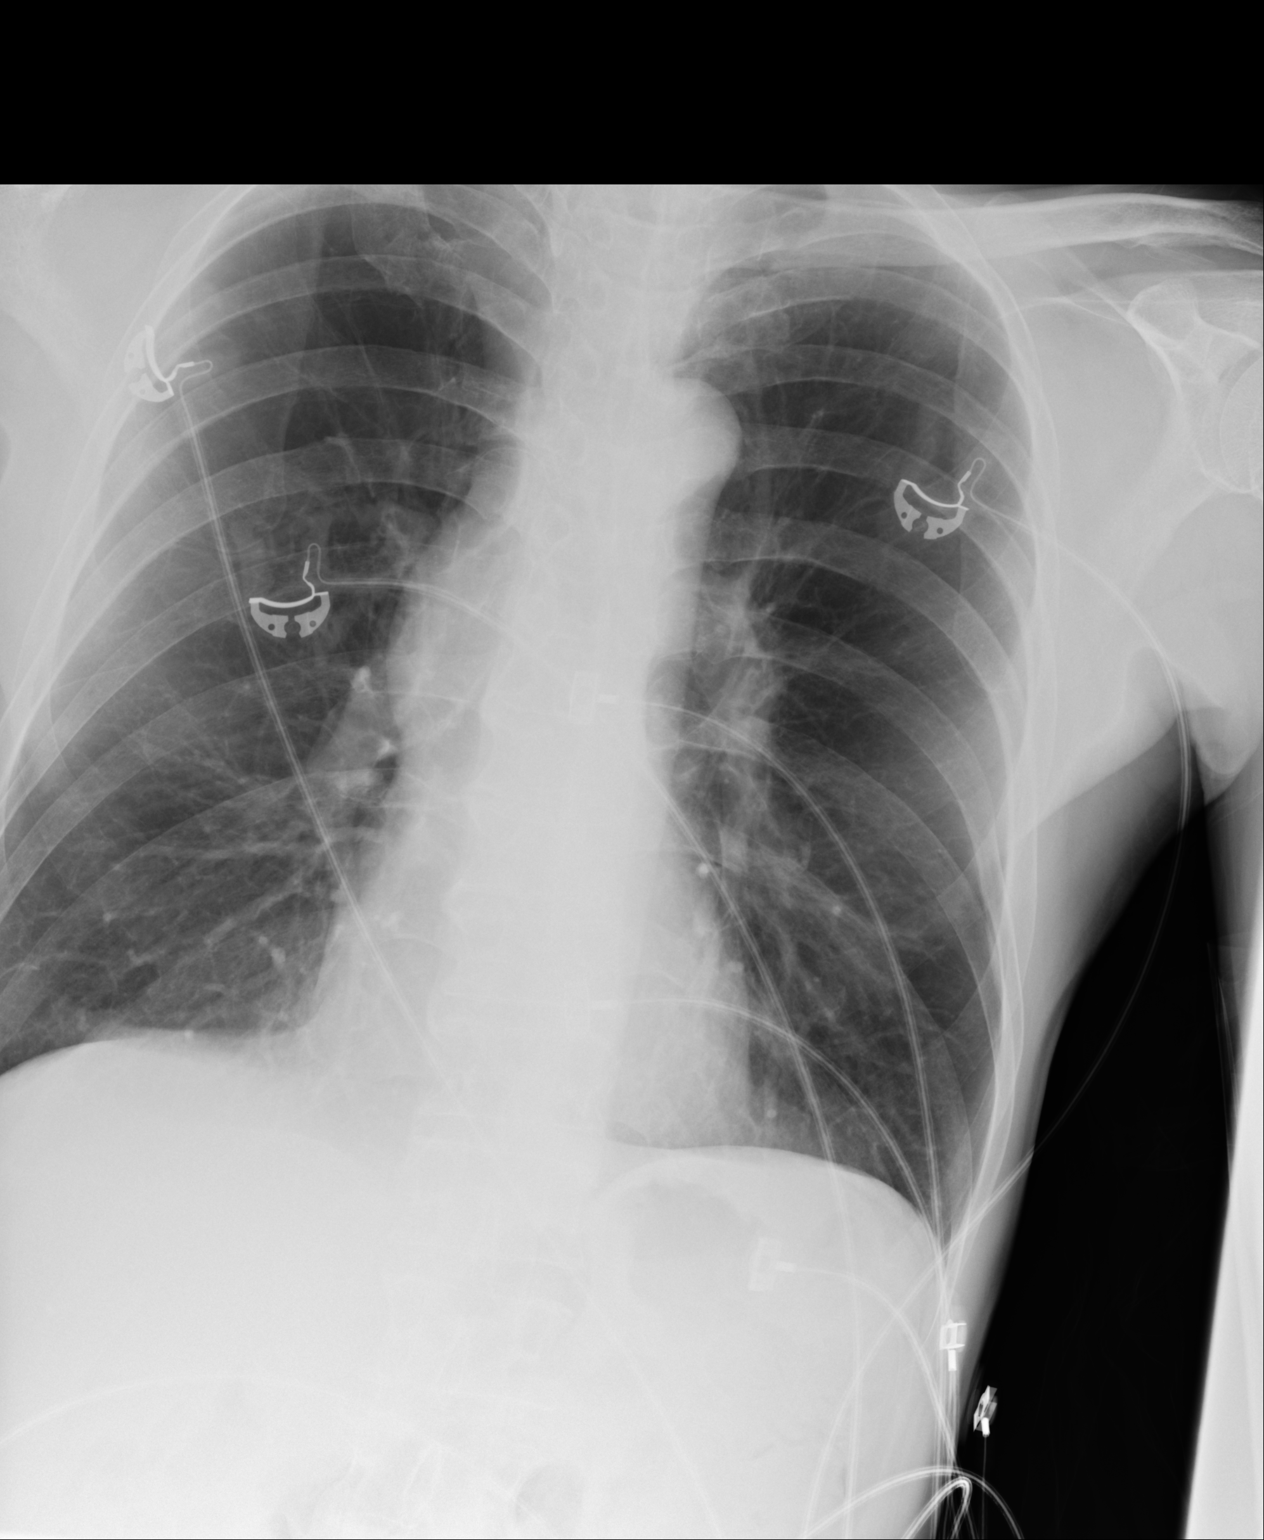

[2 of 2 positions shown; findings below may reference images not displayed]

FINDINGS: Mild hyperinflation of the lungs. Heart and mediastinal contours are
within normal limits. No focal opacities or effusions. No acute bony
abnormality.
IMPRESSION: Hyperinflation/COPD.  No active cardiopulmonary disease.

## 2017-11-03 DIAGNOSIS — G629 Polyneuropathy, unspecified: Secondary | ICD-10-CM | POA: Diagnosis not present

## 2017-11-03 DIAGNOSIS — G6289 Other specified polyneuropathies: Secondary | ICD-10-CM | POA: Diagnosis not present

## 2017-11-03 DIAGNOSIS — I471 Supraventricular tachycardia: Secondary | ICD-10-CM | POA: Diagnosis not present

## 2017-11-03 DIAGNOSIS — J441 Chronic obstructive pulmonary disease with (acute) exacerbation: Secondary | ICD-10-CM | POA: Diagnosis not present

## 2017-11-03 DIAGNOSIS — F411 Generalized anxiety disorder: Secondary | ICD-10-CM | POA: Diagnosis not present

## 2017-11-03 DIAGNOSIS — K449 Diaphragmatic hernia without obstruction or gangrene: Secondary | ICD-10-CM | POA: Diagnosis not present

## 2017-11-03 DIAGNOSIS — J449 Chronic obstructive pulmonary disease, unspecified: Secondary | ICD-10-CM | POA: Diagnosis not present

## 2017-11-03 DIAGNOSIS — R0789 Other chest pain: Secondary | ICD-10-CM | POA: Diagnosis not present

## 2017-11-03 DIAGNOSIS — R0602 Shortness of breath: Secondary | ICD-10-CM | POA: Diagnosis not present

## 2017-11-03 DIAGNOSIS — I10 Essential (primary) hypertension: Secondary | ICD-10-CM | POA: Diagnosis not present

## 2017-11-03 DIAGNOSIS — R079 Chest pain, unspecified: Secondary | ICD-10-CM | POA: Diagnosis not present

## 2017-11-03 DIAGNOSIS — R008 Other abnormalities of heart beat: Secondary | ICD-10-CM | POA: Diagnosis not present

## 2017-11-03 DIAGNOSIS — E119 Type 2 diabetes mellitus without complications: Secondary | ICD-10-CM | POA: Diagnosis not present

## 2017-11-04 DIAGNOSIS — R0602 Shortness of breath: Secondary | ICD-10-CM | POA: Diagnosis not present

## 2017-11-04 DIAGNOSIS — K449 Diaphragmatic hernia without obstruction or gangrene: Secondary | ICD-10-CM | POA: Diagnosis not present

## 2017-11-04 DIAGNOSIS — R0789 Other chest pain: Secondary | ICD-10-CM | POA: Diagnosis not present

## 2017-11-04 DIAGNOSIS — G629 Polyneuropathy, unspecified: Secondary | ICD-10-CM | POA: Diagnosis not present

## 2017-11-04 DIAGNOSIS — J449 Chronic obstructive pulmonary disease, unspecified: Secondary | ICD-10-CM | POA: Diagnosis not present

## 2017-11-04 DIAGNOSIS — I471 Supraventricular tachycardia: Secondary | ICD-10-CM | POA: Diagnosis not present

## 2017-11-04 DIAGNOSIS — G6289 Other specified polyneuropathies: Secondary | ICD-10-CM | POA: Diagnosis not present

## 2017-11-04 DIAGNOSIS — F411 Generalized anxiety disorder: Secondary | ICD-10-CM | POA: Diagnosis not present

## 2017-11-04 DIAGNOSIS — J441 Chronic obstructive pulmonary disease with (acute) exacerbation: Secondary | ICD-10-CM | POA: Diagnosis not present

## 2017-11-04 DIAGNOSIS — E119 Type 2 diabetes mellitus without complications: Secondary | ICD-10-CM | POA: Diagnosis not present

## 2017-11-04 DIAGNOSIS — I10 Essential (primary) hypertension: Secondary | ICD-10-CM | POA: Diagnosis not present

## 2017-11-04 DIAGNOSIS — R079 Chest pain, unspecified: Secondary | ICD-10-CM | POA: Diagnosis not present

## 2017-11-05 DIAGNOSIS — J441 Chronic obstructive pulmonary disease with (acute) exacerbation: Secondary | ICD-10-CM | POA: Diagnosis not present

## 2017-11-05 DIAGNOSIS — F411 Generalized anxiety disorder: Secondary | ICD-10-CM | POA: Diagnosis not present

## 2017-11-05 DIAGNOSIS — I10 Essential (primary) hypertension: Secondary | ICD-10-CM | POA: Diagnosis not present

## 2017-11-05 DIAGNOSIS — G6289 Other specified polyneuropathies: Secondary | ICD-10-CM | POA: Diagnosis not present

## 2017-11-05 DIAGNOSIS — G629 Polyneuropathy, unspecified: Secondary | ICD-10-CM | POA: Diagnosis not present

## 2017-11-05 DIAGNOSIS — R079 Chest pain, unspecified: Secondary | ICD-10-CM | POA: Diagnosis not present

## 2017-11-05 DIAGNOSIS — E119 Type 2 diabetes mellitus without complications: Secondary | ICD-10-CM | POA: Diagnosis not present

## 2017-11-05 DIAGNOSIS — J449 Chronic obstructive pulmonary disease, unspecified: Secondary | ICD-10-CM | POA: Diagnosis not present

## 2017-11-05 DIAGNOSIS — I471 Supraventricular tachycardia: Secondary | ICD-10-CM | POA: Diagnosis not present

## 2017-11-05 DIAGNOSIS — R0602 Shortness of breath: Secondary | ICD-10-CM | POA: Diagnosis not present

## 2017-11-05 DIAGNOSIS — R0789 Other chest pain: Secondary | ICD-10-CM | POA: Diagnosis not present

## 2017-11-05 DIAGNOSIS — K449 Diaphragmatic hernia without obstruction or gangrene: Secondary | ICD-10-CM | POA: Diagnosis not present

## 2017-11-13 DIAGNOSIS — Z681 Body mass index (BMI) 19 or less, adult: Secondary | ICD-10-CM | POA: Diagnosis not present

## 2017-11-13 DIAGNOSIS — J441 Chronic obstructive pulmonary disease with (acute) exacerbation: Secondary | ICD-10-CM | POA: Diagnosis not present

## 2017-12-19 DIAGNOSIS — Z0000 Encounter for general adult medical examination without abnormal findings: Secondary | ICD-10-CM | POA: Diagnosis not present

## 2017-12-19 DIAGNOSIS — K210 Gastro-esophageal reflux disease with esophagitis: Secondary | ICD-10-CM | POA: Diagnosis not present

## 2017-12-19 DIAGNOSIS — J441 Chronic obstructive pulmonary disease with (acute) exacerbation: Secondary | ICD-10-CM | POA: Diagnosis not present

## 2017-12-19 DIAGNOSIS — Z681 Body mass index (BMI) 19 or less, adult: Secondary | ICD-10-CM | POA: Diagnosis not present

## 2017-12-19 DIAGNOSIS — E119 Type 2 diabetes mellitus without complications: Secondary | ICD-10-CM | POA: Diagnosis not present

## 2017-12-19 DIAGNOSIS — E7849 Other hyperlipidemia: Secondary | ICD-10-CM | POA: Diagnosis not present

## 2017-12-19 DIAGNOSIS — I10 Essential (primary) hypertension: Secondary | ICD-10-CM | POA: Diagnosis not present

## 2018-02-27 DIAGNOSIS — J441 Chronic obstructive pulmonary disease with (acute) exacerbation: Secondary | ICD-10-CM | POA: Diagnosis not present

## 2018-02-27 DIAGNOSIS — Z681 Body mass index (BMI) 19 or less, adult: Secondary | ICD-10-CM | POA: Diagnosis not present

## 2018-03-02 DIAGNOSIS — E119 Type 2 diabetes mellitus without complications: Secondary | ICD-10-CM | POA: Diagnosis not present

## 2018-03-02 DIAGNOSIS — Z681 Body mass index (BMI) 19 or less, adult: Secondary | ICD-10-CM | POA: Diagnosis not present

## 2018-03-02 DIAGNOSIS — I10 Essential (primary) hypertension: Secondary | ICD-10-CM | POA: Diagnosis not present

## 2018-03-02 DIAGNOSIS — J441 Chronic obstructive pulmonary disease with (acute) exacerbation: Secondary | ICD-10-CM | POA: Diagnosis not present

## 2018-05-07 DIAGNOSIS — J441 Chronic obstructive pulmonary disease with (acute) exacerbation: Secondary | ICD-10-CM | POA: Diagnosis not present

## 2018-05-07 DIAGNOSIS — K5900 Constipation, unspecified: Secondary | ICD-10-CM | POA: Diagnosis not present

## 2018-05-23 DIAGNOSIS — I252 Old myocardial infarction: Secondary | ICD-10-CM | POA: Diagnosis not present

## 2018-05-23 DIAGNOSIS — R0602 Shortness of breath: Secondary | ICD-10-CM | POA: Diagnosis not present

## 2018-05-23 DIAGNOSIS — I10 Essential (primary) hypertension: Secondary | ICD-10-CM | POA: Diagnosis not present

## 2018-05-23 DIAGNOSIS — Z79899 Other long term (current) drug therapy: Secondary | ICD-10-CM | POA: Diagnosis not present

## 2018-05-23 DIAGNOSIS — J439 Emphysema, unspecified: Secondary | ICD-10-CM | POA: Diagnosis not present

## 2018-05-23 DIAGNOSIS — Z7984 Long term (current) use of oral hypoglycemic drugs: Secondary | ICD-10-CM | POA: Diagnosis not present

## 2018-05-23 DIAGNOSIS — Z87891 Personal history of nicotine dependence: Secondary | ICD-10-CM | POA: Diagnosis not present

## 2018-05-23 DIAGNOSIS — J441 Chronic obstructive pulmonary disease with (acute) exacerbation: Secondary | ICD-10-CM | POA: Diagnosis not present

## 2018-05-30 IMAGING — CR DG CHEST 1V PORT
1 series · 1 of 1 positions shown · non-contrast
Comparison: 01/07/2016 and prior radiographs

CLINICAL DATA: Shortness of breath for 5 days.  History of COPD.

EXAM:
PORTABLE CHEST 1 VIEW

[portable]
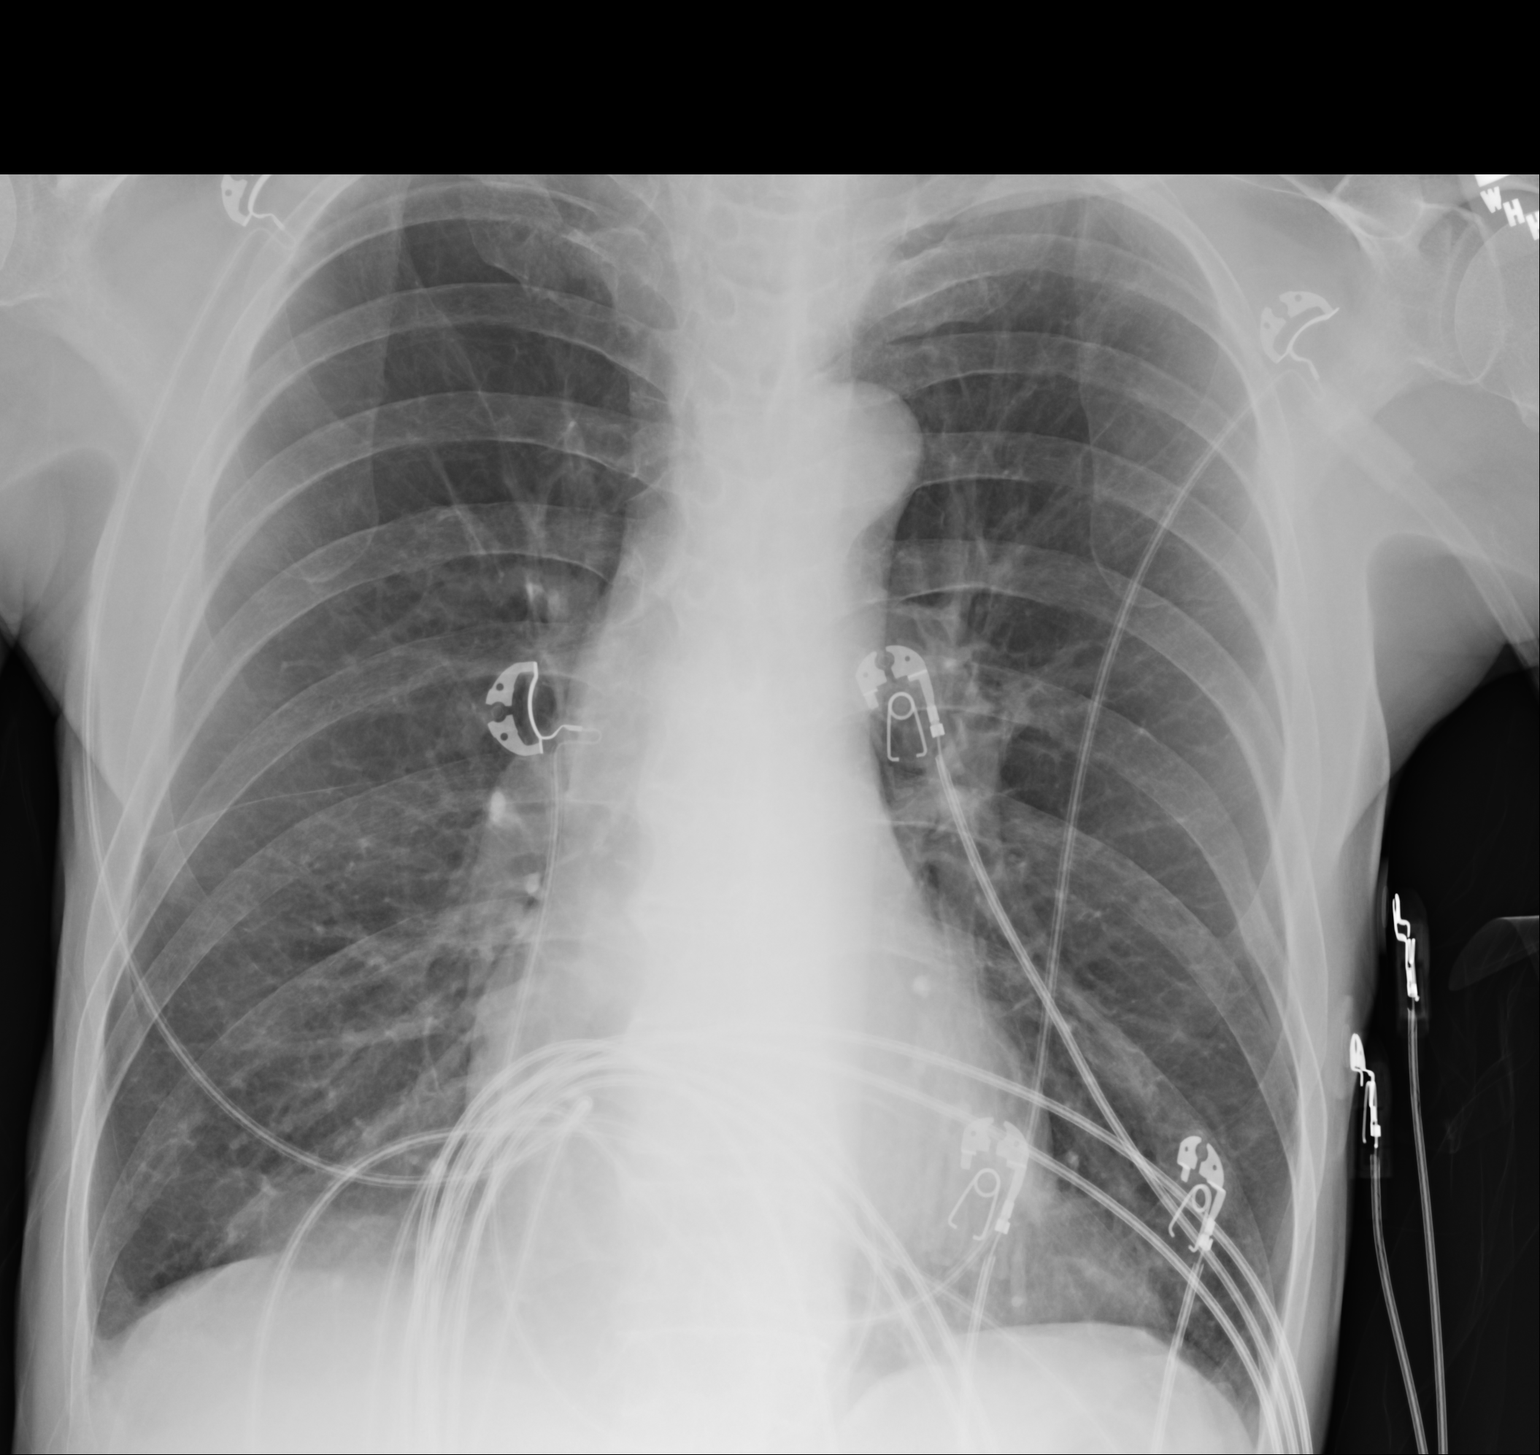

[1 of 1 positions shown; findings below may reference images not displayed]

FINDINGS: Upper limits normal heart size and COPD/ emphysema changes again
noted.

There is no evidence of focal airspace disease, pulmonary edema,
suspicious pulmonary nodule/mass, pleural effusion, or pneumothorax.

No acute bony abnormalities are identified.
IMPRESSION: No evidence of acute cardiopulmonary disease.

Upper limits normal heart size and COPD/ emphysema.

## 2018-06-07 ENCOUNTER — Encounter (HOSPITAL_COMMUNITY): Payer: Self-pay | Admitting: Emergency Medicine

## 2018-06-07 ENCOUNTER — Emergency Department (HOSPITAL_COMMUNITY): Payer: Medicare Other

## 2018-06-07 ENCOUNTER — Other Ambulatory Visit: Payer: Self-pay

## 2018-06-07 ENCOUNTER — Emergency Department (HOSPITAL_COMMUNITY)
Admission: EM | Admit: 2018-06-07 | Discharge: 2018-06-07 | Disposition: A | Discharge status: Home / Self Care | Payer: Medicare Other | Attending: Emergency Medicine | Admitting: Emergency Medicine

## 2018-06-07 DIAGNOSIS — I10 Essential (primary) hypertension: Secondary | ICD-10-CM | POA: Diagnosis not present

## 2018-06-07 DIAGNOSIS — R918 Other nonspecific abnormal finding of lung field: Secondary | ICD-10-CM | POA: Diagnosis not present

## 2018-06-07 DIAGNOSIS — J441 Chronic obstructive pulmonary disease with (acute) exacerbation: Secondary | ICD-10-CM | POA: Diagnosis not present

## 2018-06-07 DIAGNOSIS — Z7984 Long term (current) use of oral hypoglycemic drugs: Secondary | ICD-10-CM | POA: Diagnosis not present

## 2018-06-07 DIAGNOSIS — E119 Type 2 diabetes mellitus without complications: Secondary | ICD-10-CM | POA: Diagnosis not present

## 2018-06-07 DIAGNOSIS — R0602 Shortness of breath: Secondary | ICD-10-CM | POA: Diagnosis present

## 2018-06-07 DIAGNOSIS — Z79899 Other long term (current) drug therapy: Secondary | ICD-10-CM | POA: Insufficient documentation

## 2018-06-07 DIAGNOSIS — R000 Tachycardia, unspecified: Secondary | ICD-10-CM | POA: Diagnosis not present

## 2018-06-07 DIAGNOSIS — Z7982 Long term (current) use of aspirin: Secondary | ICD-10-CM | POA: Diagnosis not present

## 2018-06-07 DIAGNOSIS — Z9981 Dependence on supplemental oxygen: Secondary | ICD-10-CM | POA: Insufficient documentation

## 2018-06-07 LAB — CBC WITH DIFFERENTIAL/PLATELET
Abs Immature Granulocytes: 0.05 10*3/uL (ref 0.00–0.07)
BASOS ABS: 0 10*3/uL (ref 0.0–0.1)
BASOS PCT: 0 %
EOS PCT: 6 %
Eosinophils Absolute: 0.6 10*3/uL — ABNORMAL HIGH (ref 0.0–0.5)
HCT: 45.9 % (ref 39.0–52.0)
Hemoglobin: 14.2 g/dL (ref 13.0–17.0)
Immature Granulocytes: 1 %
LYMPHS PCT: 10 %
Lymphs Abs: 1 10*3/uL (ref 0.7–4.0)
MCH: 27.9 pg (ref 26.0–34.0)
MCHC: 30.9 g/dL (ref 30.0–36.0)
MCV: 90.2 fL (ref 80.0–100.0)
Monocytes Absolute: 1 10*3/uL (ref 0.1–1.0)
Monocytes Relative: 10 %
NRBC: 0 % (ref 0.0–0.2)
Neutro Abs: 7.3 10*3/uL (ref 1.7–7.7)
Neutrophils Relative %: 73 %
PLATELETS: 353 10*3/uL (ref 150–400)
RBC: 5.09 MIL/uL (ref 4.22–5.81)
RDW: 14.3 % (ref 11.5–15.5)
WBC: 9.9 10*3/uL (ref 4.0–10.5)

## 2018-06-07 LAB — BASIC METABOLIC PANEL
ANION GAP: 13 (ref 5–15)
BUN: 5 mg/dL — ABNORMAL LOW (ref 8–23)
CALCIUM: 10.4 mg/dL — AB (ref 8.9–10.3)
CO2: 31 mmol/L (ref 22–32)
Chloride: 90 mmol/L — ABNORMAL LOW (ref 98–111)
Creatinine, Ser: 0.69 mg/dL (ref 0.61–1.24)
GFR calc non Af Amer: >60 mL/min (ref >60)
Glucose, Bld: 116 mg/dL — ABNORMAL HIGH (ref 70–99)
Potassium: 3.5 mmol/L (ref 3.5–5.1)
Sodium: 134 mmol/L — ABNORMAL LOW (ref 135–145)

## 2018-06-07 LAB — URINALYSIS, ROUTINE W REFLEX MICROSCOPIC
BILIRUBIN URINE: NEGATIVE
Glucose, UA: NEGATIVE mg/dL
HGB URINE DIPSTICK: NEGATIVE
Ketones, ur: NEGATIVE mg/dL
Leukocytes, UA: NEGATIVE
NITRITE: NEGATIVE
PROTEIN: NEGATIVE mg/dL
Specific Gravity, Urine: 1.005 (ref 1.005–1.030)
pH: 8 (ref 5.0–8.0)

## 2018-06-07 LAB — TROPONIN I

## 2018-06-07 MED ORDER — ALBUTEROL SULFATE (2.5 MG/3ML) 0.083% IN NEBU: 2.5000 mg | INHALATION_SOLUTION | RESPIRATORY_TRACT | 0 refills | Status: DC | PRN

## 2018-06-07 MED ORDER — METHYLPREDNISOLONE SODIUM SUCC 125 MG IJ SOLR
125.0000 mg | Freq: Once | INTRAMUSCULAR | Status: AC
  Administered 2018-06-07: 125 mg via INTRAVENOUS
  Filled 2018-06-07: qty 2

## 2018-06-07 MED ORDER — DOXYCYCLINE HYCLATE 100 MG PO TABS: 100.0000 mg | ORAL_TABLET | Freq: Two times a day (BID) | ORAL | 0 refills | Status: DC

## 2018-06-07 MED ORDER — ALBUTEROL (5 MG/ML) CONTINUOUS INHALATION SOLN
10.0000 mg/h | INHALATION_SOLUTION | Freq: Once | RESPIRATORY_TRACT | Status: AC
  Administered 2018-06-07: 10 mg/h via RESPIRATORY_TRACT
  Filled 2018-06-07: qty 20

## 2018-06-07 MED ORDER — PREDNISONE 20 MG PO TABS: 40.0000 mg | ORAL_TABLET | Freq: Every day | ORAL | 0 refills | Status: DC

## 2018-06-07 MED ORDER — IPRATROPIUM BROMIDE 0.02 % IN SOLN
1.0000 mg | Freq: Once | RESPIRATORY_TRACT | Status: AC
  Administered 2018-06-07: 1 mg via RESPIRATORY_TRACT
  Filled 2018-06-07: qty 5

## 2018-06-07 MED ORDER — ALBUTEROL SULFATE HFA 108 (90 BASE) MCG/ACT IN AERS: 2.0000 | INHALATION_SPRAY | RESPIRATORY_TRACT | 0 refills | Status: DC | PRN

## 2018-06-07 NOTE — ED Provider Notes (Signed)
[facility]GENCY DEPARTMENT Provider Note   CSN: [id number] Arrival date & time: 06/07/18  [id number]     History   Chief Complaint Chief Complaint  Patient presents with  . Shortness of Breath    HPI Jeffery [name] is a 68 y.o. male.  HPI  Pt was seen at 0900.  Per pt, c/o gradual onset and worsening of persistent cough, wheezing and SOB for the past 1 week, worse over the past few days.  Describes his symptoms as "my COPD."  Has been using home MDI and nebs with transient relief. States he recently finished a course of prednisone PO and wears O2 N/C at night. Pt did receive a flu shot this year.  Denies CP/palpitations, no back pain, no abd pain, no N/V/D, no fevers, no rash.    Past Medical History:  Diagnosis Date  . Chest pain 04/2011   Normal echocardiogram  . COPD (chronic obstructive pulmonary disease) (HCC)   . Fasting hyperglycemia    Normal hemoglobin A1c of 6.0  . GERD (gastroesophageal reflux disease)   . Hyperlipemia   . Hypertension   . On home O2    2L N/C  . On home O2    at night  . Prediabetes 06/04/2015  . Tobacco abuse     Patient Active Problem List   Diagnosis Date Noted  . Right middle lobe pneumonia (HCC) 11/07/2016  . Acute on chronic respiratory failure with hypoxia (HCC) 11/07/2016  . Prediabetes 06/04/2015  . Hyperglycemia, drug-induced 06/03/2015  . Essential hypertension 06/03/2015  . COPD exacerbation (HCC) 06/02/2015  . Hyperlipidemia 07/20/2011  . Fasting hyperglycemia   . Hypertension   . Tobacco abuse   . Chest pain 04/02/2011    Past Surgical History:  Procedure Laterality Date  . APPENDECTOMY    . APPENDECTOMY    . LEFT HEART CATHETERIZATION WITH CORONARY ANGIOGRAM N/A 05/17/2011   Procedure: LEFT HEART CATHETERIZATION WITH CORONARY ANGIOGRAM;  Surgeon: Tonny BollmanMichael Cooper, MD;  Location: [facility] CATH LAB;  Service: Cardiovascular;  Laterality: N/A;        Home Medications    Prior to Admission medications   Medication  Sig Start Date End Date Taking? Authorizing Provider  albuterol (PROVENTIL HFA;VENTOLIN HFA) 108 (90 Base) MCG/ACT inhaler Inhale 2 puffs into the lungs every 4 (four) hours as needed for wheezing or shortness of breath. 03/10/16   Samuel JesterMcManus, Akif Weldy, DO  ALPRAZolam Prudy Feeler(XANAX) 0.5 MG tablet Take 0.5 mg by mouth at bedtime.    [provider]  aspirin EC 81 MG tablet Take 81 mg by mouth daily.    [provider]  benzonatate (TESSALON) 100 MG capsule Take 1 capsule (100 mg total) by mouth 3 (three) times daily as needed for cough. Patient not taking: Reported on 05/02/2017 04/02/17   Linwood DibblesKnapp, Jon, MD  budesonide-formoterol [facility](SYMBICORT) 160-4.5 MCG/ACT inhaler Inhale 2 puffs into the lungs 2 (two) times daily.    [provider]  chlorthalidone (HYGROTON) 25 MG tablet Take 25 mg by mouth daily.    [provider]  doxycycline (VIBRAMYCIN) 100 MG capsule Take 1 capsule (100 mg total) by mouth 2 (two) times daily. 06/08/17   Gilda CreasePollina, Christopher J, MD  gabapentin (NEURONTIN) 300 MG capsule Take 300 mg by mouth 2 (two) times daily.  03/22/13   [provider]  ipratropium-albuterol (DUONEB) 0.5-2.5 (3) MG/3ML SOLN Inhale 3 mLs into the lungs every 6 (six) hours as needed (for shortness of breath/wheezing).  07/17/14   [provider]  isosorbide mononitrate (IMDUR) 30 MG 24 hr tablet Take 30 mg by mouth daily. 03/22/14   [provider]  metFORMIN (GLUCOPHAGE) 500 MG tablet Take 500 mg by mouth daily.    [provider]  metoprolol succinate (TOPROL-XL) 50 MG 24 hr tablet Take 50 mg by mouth every morning. Take with or immediately following a meal.    [provider]  multivitamin (ONE-A-DAY MEN'S) TABS Take 1 tablet by mouth daily.      [provider]  omeprazole (PRILOSEC) 20 MG capsule Take 20 mg by mouth 2 (two) times daily.  05/26/15   [provider]  oseltamivir (TAMIFLU) 75 MG capsule Take 1 capsule (75 mg total)  by mouth every 12 (twelve) hours. 06/08/17   Gilda Crease, MD  OXYGEN Inhale 2 L into the lungs daily as needed.     [provider]  polyethylene glycol (MIRALAX / GLYCOLAX) packet Take 17 g by mouth daily as needed for mild constipation.    [provider]  potassium chloride SA (K-DUR,KLOR-CON) 20 MEQ tablet Take 20 mEq by mouth daily.  05/12/14   [provider]  predniSONE (DELTASONE) 20 MG tablet Take 3 tablets (60 mg total) by mouth daily with breakfast. 06/08/17   Pollina, Canary Brim, MD  ranitidine (ZANTAC) 150 MG tablet Take 150 mg by mouth 2 (two) times daily.    [provider]  simvastatin (ZOCOR) 20 MG tablet Take 20 mg by mouth at bedtime.  03/22/14   [provider]    Family History Family History  Problem Relation Age of Onset  . Emphysema Father     Social History Social History   Tobacco Use  . Smoking status: Former Smoker    Packs/day: 1.00    Years: 35.00    Pack years: 35.00    Types: Cigarettes    Last attempt to quit: 05/09/2013    Years since quitting: 5.0  . Smokeless tobacco: Never Used  Substance Use Topics  . Alcohol use: No  . Drug use: No     Allergies   Codeine   Review of Systems Review of Systems ROS: Statement: All systems negative except as marked or noted in the HPI; Constitutional: Negative for fever and chills. ; ; Eyes: Negative for eye pain, redness and discharge. ; ; ENMT: Negative for ear pain, hoarseness, nasal congestion, sinus pressure and sore throat. ; ; Cardiovascular: Negative for chest pain, palpitations, diaphoresis, and peripheral edema. ; ; Respiratory: +cough, wheezing, SOB. Negative for stridor. ; ; Gastrointestinal: Negative for nausea, vomiting, diarrhea, abdominal pain, blood in stool, hematemesis, jaundice and rectal bleeding. . ; ; Genitourinary: Negative for dysuria, flank pain and hematuria. ; ; Musculoskeletal: Negative for back pain and neck pain. Negative for  swelling and trauma.; ; Skin: Negative for pruritus, rash, abrasions, blisters, bruising and skin lesion.; ; Neuro: Negative for headache, lightheadedness and neck stiffness. Negative for weakness, altered level of consciousness, altered mental status, extremity weakness, paresthesias, involuntary movement, seizure and syncope.       Physical Exam Updated Vital Signs BP (!) 140/104 (BP Location: Left Arm)   Pulse 84   Temp 97.9 F (36.6 C) (Oral)   Resp (!) 22   Ht 5\' 7"  (1.702 m)   Wt 56.2 kg   SpO2 97%   BMI 19.42 kg/m    Patient Vitals for the past 24 hrs:  BP Temp Temp src Pulse Resp SpO2 Height Weight  06/07/18 1100 132/84 - - Marland Kitchen)  103 12 92 % - -  06/07/18 1045 - - - (!) 109 10 94 % - -  06/07/18 1030 140/90 - - (!) 112 15 95 % - -  06/07/18 1015 - - - (!) 113 19 100 % - -  06/07/18 1000 (!) 148/103 - - 96 13 100 % - -  06/07/18 0945 - - - 88 16 100 % - -  06/07/18 0930 118/83 - - 83 16 100 % - -  06/07/18 0923 - - - - - 94 % - -  06/07/18 0908 (!) 140/104 97.9 F (36.6 C) Oral 84 (!) 22 97 % - -  06/07/18 0906 (!) 140/104 - - 81 (!) 25 100 % - -  06/07/18 0902 - - - - - - 5\' 7"  (1.702 m) 56.2 kg     Physical Exam 0905: Physical examination:  Nursing notes reviewed; Vital signs and O2 SAT reviewed;  Constitutional: Well developed, Well nourished, Well hydrated, Uncomfortable appearing.;; Head:  Normocephalic, atraumatic; Eyes: EOMI, PERRL, No scleral icterus; ENMT: Mouth and pharynx normal, Mucous membranes moist; Neck: Supple, Full range of motion, No lymphadenopathy; Cardiovascular: Tachycardic rate and rhythm, No gallop; Respiratory: Breath sounds diminished & equal bilaterally, insp/exp wheezes bilat. Faint audible wheezing.  Speaking short sentences, sitting upright, tachypneic.; Chest: Nontender, Movement normal; Abdomen: Soft, Nontender, Nondistended, Normal bowel sounds; Genitourinary: No CVA tenderness; Extremities: Pulses normal, No tenderness, No edema, No calf  edema or asymmetry.; Neuro: AA&Ox3, Major CN grossly intact.  Speech clear. No gross focal motor or sensory deficits in extremities.; Skin: Color normal, Warm, Dry.    ED Treatments / Results  Labs (all labs ordered are listed, but only abnormal results are displayed)   EKG EKG Interpretation  Date/Time:  Thursday June 07 2018 09:07:44 EST Ventricular Rate:  80 PR Interval:    QRS Duration: 92 QT Interval:  391 QTC Calculation: 454 R Axis:   82 Text Interpretation:  Poor data quality Sinus rhythm Biatrial enlargement Borderline right axis deviation Repol abnrm suggests ischemia, diffuse leads Baseline wander When compared with ECG of 10/06/2017 Baseline wander is now Present no apparent acute changes present Confirmed by Samuel JesterMcManus, Kamdyn Covel [id number]) on 06/07/2018 9:13:46 AM    EKG Interpretation  Date/Time:  Thursday June 07 2018 10:35:58 EST Ventricular Rate:  110 PR Interval:    QRS Duration: 95 QT Interval:  297 QTC Calculation: 402 R Axis:   80 Text Interpretation:  Sinus tachycardia Biatrial enlargement Repol abnrm  inferior leads Baseline wander Artifact When compared with ECG of 06/08/2017 No significant change was found Reconfirmed by Samuel JesterMcManus, Dewane Timson [phone]([id number]) on 06/07/2018 10:43:46 AM          Radiology   Procedures Procedures (including critical care time)  Medications Ordered in ED Medications  methylPREDNISolone sodium succinate (SOLU-MEDROL) 125 mg/2 mL injection 125 mg (has no administration in time range)  albuterol (PROVENTIL,VENTOLIN) solution continuous neb (has no administration in time range)  ipratropium (ATROVENT) nebulizer solution 1 mg (has no administration in time range)     Initial Impression / Assessment and Plan / ED Course  I have reviewed the triage vital signs and the nursing notes.  Pertinent labs & imaging results that were available during my care of the patient were reviewed by me and considered in my medical decision making  (see chart for details).  MDM Reviewed: previous chart, vitals and nursing note Reviewed previous: labs and ECG Interpretation: labs, ECG and x-ray Total time providing critical care: 30-74 minutes. This  excludes time spent performing separately reportable procedures and services. Consults: admitting MD   CRITICAL CARE Performed by: Samuel Jester Total critical care time: 35 minutes Critical care time was exclusive of separately billable procedures and treating other patients. Critical care was necessary to treat or prevent imminent or life-threatening deterioration. Critical care was time spent personally by me on the following activities: development of treatment plan with patient and/or surrogate as well as nursing, discussions with consultants, evaluation of patient's response to treatment, examination of patient, obtaining history from patient or surrogate, ordering and performing treatments and interventions, ordering and review of laboratory studies, ordering and review of radiographic studies, pulse oximetry and re-evaluation of patient's condition.   Results for orders placed or performed during the hospital encounter of 06/07/18  Basic metabolic panel  Result Value Ref Range   Sodium 134 (L) 135 - 145 mmol/L   Potassium 3.5 3.5 - 5.1 mmol/L   Chloride 90 (L) 98 - 111 mmol/L   CO2 31 22 - 32 mmol/L   Glucose, Bld 116 (H) 70 - 99 mg/dL   BUN 5 (L) 8 - 23 mg/dL   Creatinine, Ser 9.14 0.61 - 1.24 mg/dL   Calcium 78.2 (H) 8.9 - 10.3 mg/dL   GFR calc non Af Amer >60 >60 mL/min   GFR calc Af Amer >60 >60 mL/min   Anion gap 13 5 - 15  Troponin I - Once  Result Value Ref Range   Troponin I <0.03 <0.03 ng/mL  CBC with Differential  Result Value Ref Range   WBC 9.9 4.0 - 10.5 K/uL   RBC 5.09 4.22 - 5.81 MIL/uL   Hemoglobin 14.2 13.0 - 17.0 g/dL   HCT 95.6 21.3 - 08.6 %   MCV 90.2 80.0 - 100.0 fL   MCH 27.9 26.0 - 34.0 pg   MCHC 30.9 30.0 - 36.0 g/dL   RDW 57.8 46.9 -  62.9 %   Platelets 353 150 - 400 K/uL   nRBC 0.0 0.0 - 0.2 %   Neutrophils Relative % 73 %   Neutro Abs 7.3 1.7 - 7.7 K/uL   Lymphocytes Relative 10 %   Lymphs Abs 1.0 0.7 - 4.0 K/uL   Monocytes Relative 10 %   Monocytes Absolute 1.0 0.1 - 1.0 K/uL   Eosinophils Relative 6 %   Eosinophils Absolute 0.6 (H) 0.0 - 0.5 K/uL   Basophils Relative 0 %   Basophils Absolute 0.0 0.0 - 0.1 K/uL   Immature Granulocytes 1 %   Abs Immature Granulocytes 0.05 0.00 - 0.07 K/uL  Urinalysis, Routine w reflex microscopic  Result Value Ref Range   Color, Urine YELLOW YELLOW   APPearance CLEAR CLEAR   Specific Gravity, Urine 1.005 1.005 - 1.030   pH 8.0 5.0 - 8.0   Glucose, UA NEGATIVE NEGATIVE mg/dL   Hgb urine dipstick NEGATIVE NEGATIVE   Bilirubin Urine NEGATIVE NEGATIVE   Ketones, ur NEGATIVE NEGATIVE mg/dL   Protein, ur NEGATIVE NEGATIVE mg/dL   Nitrite NEGATIVE NEGATIVE   Leukocytes, UA NEGATIVE NEGATIVE   Dg Chest Port 1 View Result Date: 06/07/2018 CLINICAL DATA:  Shortness of breath. EXAM: PORTABLE CHEST 1 VIEW COMPARISON:  Radiographs of May 23, 2018 and November 03, 2017. FINDINGS: The heart size and mediastinal contours are within normal limits. No pneumothorax or pleural effusion is noted. Left lung is clear. Increased right infrahilar opacity is noted which may represent atelectasis or infiltrate. The visualized skeletal structures are unremarkable. IMPRESSION: Increased right infrahilar opacity is  noted which may represent atelectasis or infiltrate. Short-term follow-up radiographs are recommended to ensure resolution. Electronically Signed   By: Lupita Raider, M.D.   On: 06/07/2018 09:57    1200:  Pt states he "feels better" after neb and steroid.  NAD, lungs CTA bilat, no wheezing, resps easy, speaking full sentences, Sats 100% R/A.  Pt ambulated around the ED x2 with Sats remaining 98-100 % R/A, resps easy, NAD.  Pt talking on his cellphone, states he wants to go home now. Dx and testing  d/w pt.  Questions answered.  Verb understanding, agreeable to d/c home with outpt f/u.    Final Clinical Impressions(s) / ED Diagnoses   Final diagnoses:  None    ED Discharge Orders    None       Samuel Jester, DO 06/11/18 1301

## 2018-06-07 NOTE — ED Triage Notes (Signed)
HX COPD , states SOB has gotten worse over the last week, worse at night, denies pain or cough.

## 2018-06-07 NOTE — Discharge Instructions (Signed)
Take the prescriptions as directed.  Use your albuterol inhaler (2 to 4 puffs) or your albuterol nebulizer (1 unit dose) every 4 hours for the next 7 days, then as needed for cough, wheezing, or shortness of breath.  Call your regular medical doctor today to schedule a follow up appointment within the next 2 to 3 days.  Return to the Emergency Department immediately sooner if worsening.  ° °

## 2018-06-07 NOTE — ED Notes (Signed)
Pt ambulated in hallway without oxygen.  Pt states he only wears oxygen at night if needed.  O2 sat stayed between 98%-100% on room air.

## 2018-06-07 NOTE — ED Notes (Signed)
Repeat ekg given to Dr. Clarene Duke

## 2018-06-20 ENCOUNTER — Encounter (HOSPITAL_COMMUNITY): Payer: Self-pay | Admitting: *Deleted

## 2018-06-20 ENCOUNTER — Emergency Department (HOSPITAL_COMMUNITY)
Admission: EM | Admit: 2018-06-20 | Discharge: 2018-06-20 | Disposition: A | Discharge status: Home / Self Care | Payer: Medicare Other | Attending: Emergency Medicine | Admitting: Emergency Medicine

## 2018-06-20 ENCOUNTER — Other Ambulatory Visit: Payer: Self-pay

## 2018-06-20 ENCOUNTER — Emergency Department (HOSPITAL_COMMUNITY): Payer: Medicare Other

## 2018-06-20 DIAGNOSIS — R0602 Shortness of breath: Secondary | ICD-10-CM | POA: Diagnosis not present

## 2018-06-20 DIAGNOSIS — I10 Essential (primary) hypertension: Secondary | ICD-10-CM | POA: Insufficient documentation

## 2018-06-20 DIAGNOSIS — Z7984 Long term (current) use of oral hypoglycemic drugs: Secondary | ICD-10-CM | POA: Insufficient documentation

## 2018-06-20 DIAGNOSIS — J441 Chronic obstructive pulmonary disease with (acute) exacerbation: Secondary | ICD-10-CM | POA: Insufficient documentation

## 2018-06-20 DIAGNOSIS — J439 Emphysema, unspecified: Secondary | ICD-10-CM | POA: Insufficient documentation

## 2018-06-20 DIAGNOSIS — Z79899 Other long term (current) drug therapy: Secondary | ICD-10-CM | POA: Insufficient documentation

## 2018-06-20 DIAGNOSIS — Z7982 Long term (current) use of aspirin: Secondary | ICD-10-CM | POA: Insufficient documentation

## 2018-06-20 DIAGNOSIS — Z9981 Dependence on supplemental oxygen: Secondary | ICD-10-CM | POA: Diagnosis not present

## 2018-06-20 DIAGNOSIS — Z87891 Personal history of nicotine dependence: Secondary | ICD-10-CM | POA: Insufficient documentation

## 2018-06-20 LAB — CBC WITH DIFFERENTIAL/PLATELET
Abs Immature Granulocytes: 0.13 10*3/uL — ABNORMAL HIGH (ref 0.00–0.07)
BASOS ABS: 0 10*3/uL (ref 0.0–0.1)
Basophils Relative: 0 %
Eosinophils Absolute: 0.7 10*3/uL — ABNORMAL HIGH (ref 0.0–0.5)
Eosinophils Relative: 6 %
HCT: 40 % (ref 39.0–52.0)
Hemoglobin: 12.8 g/dL — ABNORMAL LOW (ref 13.0–17.0)
IMMATURE GRANULOCYTES: 1 %
Lymphocytes Relative: 6 %
Lymphs Abs: 0.7 10*3/uL (ref 0.7–4.0)
MCH: 28.8 pg (ref 26.0–34.0)
MCHC: 32 g/dL (ref 30.0–36.0)
MCV: 90.1 fL (ref 80.0–100.0)
Monocytes Absolute: 1.3 10*3/uL — ABNORMAL HIGH (ref 0.1–1.0)
Monocytes Relative: 11 %
NEUTROS ABS: 8.3 10*3/uL — AB (ref 1.7–7.7)
NEUTROS PCT: 76 %
Platelets: 257 10*3/uL (ref 150–400)
RBC: 4.44 MIL/uL (ref 4.22–5.81)
RDW: 14.5 % (ref 11.5–15.5)
WBC: 11.1 10*3/uL — ABNORMAL HIGH (ref 4.0–10.5)
nRBC: 0 % (ref 0.0–0.2)

## 2018-06-20 LAB — BASIC METABOLIC PANEL
ANION GAP: 10 (ref 5–15)
BUN: 5 mg/dL — ABNORMAL LOW (ref 8–23)
CO2: 33 mmol/L — ABNORMAL HIGH (ref 22–32)
Calcium: 9.3 mg/dL (ref 8.9–10.3)
Chloride: 89 mmol/L — ABNORMAL LOW (ref 98–111)
Creatinine, Ser: 0.58 mg/dL — ABNORMAL LOW (ref 0.61–1.24)
GFR calc Af Amer: >60 mL/min (ref >60)
GFR calc non Af Amer: >60 mL/min (ref >60)
Glucose, Bld: 103 mg/dL — ABNORMAL HIGH (ref 70–99)
Potassium: 3.2 mmol/L — ABNORMAL LOW (ref 3.5–5.1)
Sodium: 132 mmol/L — ABNORMAL LOW (ref 135–145)

## 2018-06-20 LAB — URINALYSIS, ROUTINE W REFLEX MICROSCOPIC
Bilirubin Urine: NEGATIVE
Glucose, UA: NEGATIVE mg/dL
Hgb urine dipstick: NEGATIVE
Ketones, ur: NEGATIVE mg/dL
Leukocytes,Ua: NEGATIVE
NITRITE: NEGATIVE
PROTEIN: NEGATIVE mg/dL
Specific Gravity, Urine: 1.008 (ref 1.005–1.030)
pH: 8 (ref 5.0–8.0)

## 2018-06-20 LAB — TROPONIN I: Troponin I: <0.03 ng/mL (ref <0.03)

## 2018-06-20 MED ORDER — IPRATROPIUM-ALBUTEROL 0.5-2.5 (3) MG/3ML IN SOLN
3.0000 mL | Freq: Once | RESPIRATORY_TRACT | Status: AC
  Administered 2018-06-20: 3 mL via RESPIRATORY_TRACT
  Filled 2018-06-20: qty 3

## 2018-06-20 MED ORDER — METHYLPREDNISOLONE SODIUM SUCC 125 MG IJ SOLR
80.0000 mg | Freq: Once | INTRAMUSCULAR | Status: AC
  Administered 2018-06-20: 80 mg via INTRAVENOUS
  Filled 2018-06-20: qty 2

## 2018-06-20 NOTE — Discharge Instructions (Addendum)
Chest x-ray showed no obvious pneumonia.  Recommend doubling up on your prednisone for approximately 4 days.  Use your oxygen and nebulizer treatments at home.  Return if worse.

## 2018-06-20 NOTE — ED Provider Notes (Signed)
[facility] EMERGENCY DEPARTMENT Provider Note   CSN: [id number] Arrival date & time: 06/20/18  1025    History   Chief Complaint Chief Complaint  Patient presents with  . Shortness of Breath    HPI Jeffery [name] is a 68 y.o. male.     Cough, wheezing, dyspnea for 3 days.  Patient has a known history of COPD and is on home oxygen and nebulizer treatments.  No fever, chills, rusty sputum.  He lives independently at home.  Severity of symptoms mild to moderate.  Nothing makes symptoms better or worse.     Past Medical History:  Diagnosis Date  . Chest pain 04/2011   Normal echocardiogram  . COPD (chronic obstructive pulmonary disease) (HCC)   . Fasting hyperglycemia    Normal hemoglobin A1c of 6.0  . GERD (gastroesophageal reflux disease)   . Hyperlipemia   . Hypertension   . On home O2    2L N/C  . On home O2    at night  . Prediabetes 06/04/2015  . Tobacco abuse     Patient Active Problem List   Diagnosis Date Noted  . Right middle lobe pneumonia (HCC) 11/07/2016  . Acute on chronic respiratory failure with hypoxia (HCC) 11/07/2016  . Prediabetes 06/04/2015  . Hyperglycemia, drug-induced 06/03/2015  . Essential hypertension 06/03/2015  . COPD exacerbation (HCC) 06/02/2015  . Hyperlipidemia 07/20/2011  . Fasting hyperglycemia   . Hypertension   . Tobacco abuse   . Chest pain 04/02/2011    Past Surgical History:  Procedure Laterality Date  . APPENDECTOMY    . APPENDECTOMY    . LEFT HEART CATHETERIZATION WITH CORONARY ANGIOGRAM N/A 05/17/2011   Procedure: LEFT HEART CATHETERIZATION WITH CORONARY ANGIOGRAM;  Surgeon: Tonny Bollman, MD;  Location: [facility] CATH LAB;  Service: Cardiovascular;  Laterality: N/A;        Home Medications    Prior to Admission medications   Medication Sig Start Date End Date Taking? Authorizing Provider  albuterol (PROVENTIL) (2.5 MG/3ML) 0.083% nebulizer solution Take 3 mLs (2.5 mg total) by nebulization every 4 (four) hours  as needed for wheezing or shortness of breath. 06/07/18  Yes Samuel Jester, DO  ALPRAZolam Prudy Feeler) 0.5 MG tablet Take 0.5 mg by mouth at bedtime.   Yes [provider]  aspirin EC 81 MG tablet Take 81 mg by mouth daily.   Yes [provider]  budesonide-formoterol (SYMBICORT) 160-4.5 MCG/ACT inhaler Inhale 2 puffs into the lungs 2 (two) times daily.   Yes [provider]  chlorthalidone (HYGROTON) 25 MG tablet Take 25 mg by mouth daily.   Yes [provider]  gabapentin (NEURONTIN) 300 MG capsule Take 300 mg by mouth 2 (two) times daily.  03/22/13  Yes [provider]  ipratropium-albuterol (DUONEB) 0.5-2.5 (3) MG/3ML SOLN Inhale 3 mLs into the lungs every 6 (six) hours as needed (for shortness of breath/wheezing).  07/17/14  Yes [provider]  isosorbide mononitrate (IMDUR) 30 MG 24 hr tablet Take 30 mg by mouth daily. 03/22/14  Yes [provider]  metFORMIN (GLUCOPHAGE) 500 MG tablet Take 500 mg by mouth daily.   Yes [provider]  metoprolol succinate (TOPROL-XL) 50 MG 24 hr tablet Take 50 mg by mouth every morning. Take with or immediately following a meal.   Yes [provider]  multivitamin (ONE-A-DAY MEN'S) TABS Take 1 tablet by mouth daily.     Yes [provider]  OXYGEN Inhale 2 L into the lungs daily  as needed.    Yes [provider]  polyethylene glycol (MIRALAX / GLYCOLAX) packet Take 17 g by mouth daily as needed for mild constipation.   Yes [provider]  potassium chloride SA (K-DUR,KLOR-CON) 20 MEQ tablet Take 20 mEq by mouth daily.  05/12/14  Yes [provider]  predniSONE (DELTASONE) 20 MG tablet Take 2 tablets (40 mg total) by mouth daily. 06/07/18  Yes Samuel Jester, DO  ranitidine (ZANTAC) 300 MG tablet Take 1 tablet by mouth 2 (two) times daily. 06/12/18  Yes [provider]  simvastatin (ZOCOR) 20 MG tablet Take 20 mg by mouth at bedtime.   03/22/14  Yes [provider]  tiotropium (SPIRIVA) 18 MCG inhalation capsule Place 18 mcg into inhaler and inhale daily.   Yes [provider]  albuterol (PROVENTIL HFA;VENTOLIN HFA) 108 (90 Base) MCG/ACT inhaler Inhale 2 puffs into the lungs every 4 (four) hours as needed for wheezing or shortness of breath. Patient not taking: Reported on 06/07/2018 03/10/16   Samuel Jester, DO  albuterol (PROVENTIL HFA;VENTOLIN HFA) 108 [phone] Base) MCG/ACT inhaler Inhale 2 puffs into the lungs every 4 (four) hours as needed for wheezing or shortness of breath. Patient not taking: Reported on 06/20/2018 06/07/18   Samuel Jester, DO  doxycycline (VIBRA-TABS) 100 MG tablet Take 1 tablet (100 mg total) by mouth 2 (two) times daily. Patient not taking: Reported on 06/20/2018 06/07/18   Samuel Jester, DO  methylPREDNISolone (MEDROL DOSEPAK) 4 MG TBPK tablet  05/23/18   [provider]  oseltamivir (TAMIFLU) 75 MG capsule Take 1 capsule (75 mg total) by mouth every 12 (twelve) hours. Patient not taking: Reported on 06/07/2018 06/08/17   Gilda Crease, MD    Family History Family History  Problem Relation Age of Onset  . Emphysema Father     Social History Social History   Tobacco Use  . Smoking status: Former Smoker    Packs/day: 1.00    Years: 35.00    Pack years: 35.00    Types: Cigarettes    Last attempt to quit: 05/09/2013    Years since quitting: 5.1  . Smokeless tobacco: Never Used  Substance Use Topics  . Alcohol use: No  . Drug use: No     Allergies   Codeine   Review of Systems Review of Systems  All other systems reviewed and are negative.    Physical Exam Updated Vital Signs BP 105/79 (BP Location: Right Arm)   Pulse 82   Temp 97.9 F (36.6 C) (Oral)   Resp 17   Ht 5\' 8"  (1.727 m)   Wt 56.2 kg   SpO2 100%   BMI 18.85 kg/m   Physical Exam Vitals signs and nursing note reviewed.  Constitutional:      Appearance: He is well-developed.       Comments: nad  HENT:     Head: Normocephalic and atraumatic.  Eyes:     Conjunctiva/sclera: Conjunctivae normal.  Neck:     Musculoskeletal: Neck supple.  Cardiovascular:     Rate and Rhythm: Normal rate and regular rhythm.  Pulmonary:     Effort: Pulmonary effort is normal.     Comments: Minimal bilateral expiratory wheeze. Abdominal:     General: Bowel sounds are normal.     Palpations: Abdomen is soft.  Musculoskeletal: Normal range of motion.  Skin:    General: Skin is warm and dry.  Neurological:     Mental Status: He is alert and oriented to  person, place, and time.  Psychiatric:        Behavior: Behavior normal.      ED Treatments / Results  Labs (all labs ordered are listed, but only abnormal results are displayed) Labs Reviewed  CBC WITH DIFFERENTIAL/PLATELET - Abnormal; Notable for the following components:      Result Value   WBC 11.1 (*)    Hemoglobin 12.8 (*)    Neutro Abs 8.3 (*)    Monocytes Absolute 1.3 (*)    Eosinophils Absolute 0.7 (*)    Abs Immature Granulocytes 0.13 (*)    All other components within normal limits  BASIC METABOLIC PANEL - Abnormal; Notable for the following components:   Sodium 132 (*)    Potassium 3.2 (*)    Chloride 89 (*)    CO2 33 (*)    Glucose, Bld 103 (*)    BUN 5 (*)    Creatinine, Ser 0.58 (*)    All other components within normal limits  TROPONIN I  URINALYSIS, ROUTINE W REFLEX MICROSCOPIC    EKG EKG Interpretation  Date/Time:  Wednesday June 20 2018 11:44:10 EST Ventricular Rate:  86 PR Interval:    QRS Duration: 109 QT Interval:  346 QTC Calculation: 414 R Axis:   78 Text Interpretation:  Sinus rhythm RAE, consider biatrial enlargement Borderline repolarization abnormality Baseline wander in lead(s) V5 Confirmed by Donnetta Hutchingook, Adelfa Lozito ([id number]) on 06/20/2018 11:51:19 AM   Radiology Dg Chest 2 View  Result Date: 06/20/2018 CLINICAL DATA:  68 year old male with a history of shortness of breath EXAM:  CHEST - 2 VIEW COMPARISON:  Multiple prior, most recent 06/07/2018 FINDINGS: Cardiomediastinal silhouette unchanged in size and contour. No evidence of central vascular congestion. No interlobular septal thickening. Stigmata of emphysema, with increased retrosternal airspace, flattened hemidiaphragms, increased AP diameter, and hyperinflation on the AP view. Chronic opacity in the right middle lobe, unchanged over time. No new confluent airspace disease. Degenerative changes of the spine. No acute displaced fracture. IMPRESSION: Advanced emphysema and chronic lung changes without evidence of acute cardiopulmonary disease. Note that low-dose CT lung cancer screening is recommended for patients who are 3955-66 years of age with a 30+ pack-year history of smoking, and who are currently smoking or quit <=15 years ago. Referral for pulmonary evaluation may be considered, to evaluate for possible screening candidacy if not already performed. Electronically Signed   By: Gilmer MorJaime  Wagner D.O.   On: 06/20/2018 12:45    Procedures Procedures (including critical care time)  Medications Ordered in ED Medications  ipratropium-albuterol (DUONEB) 0.5-2.5 (3) MG/3ML nebulizer solution 3 mL (3 mLs Nebulization Given 06/20/18 1200)  methylPREDNISolone sodium succinate (SOLU-MEDROL) 125 mg/2 mL injection 80 mg (80 mg Intravenous Given 06/20/18 1208)     Initial Impression / Assessment and Plan / ED Course  I have reviewed the triage vital signs and the nursing notes.  Pertinent labs & imaging results that were available during my care of the patient were reviewed by me and considered in my medical decision making (see chart for details).        Patient with a known history of COPD presents with worsening cough and wheezing.  Chest x-ray shows no obvious pneumonia, but he does have advanced emphysema.  He responded well to a nebulizer treatment and IV steroids.  We will increase his prednisone at home for a few days.   Stable at discharge.  Final Clinical Impressions(s) / ED Diagnoses   Final diagnoses:  COPD exacerbation (HCC)  ED Discharge Orders    None       Donnetta Hutching, MD 06/20/18 1544

## 2018-06-20 NOTE — ED Triage Notes (Signed)
C/o shortness of breath with wheezing x 3 days, patient is on home oxygen

## 2018-07-01 DIAGNOSIS — Z79899 Other long term (current) drug therapy: Secondary | ICD-10-CM | POA: Diagnosis not present

## 2018-07-01 DIAGNOSIS — Z7984 Long term (current) use of oral hypoglycemic drugs: Secondary | ICD-10-CM | POA: Diagnosis not present

## 2018-07-01 DIAGNOSIS — R0602 Shortness of breath: Secondary | ICD-10-CM | POA: Diagnosis not present

## 2018-07-01 DIAGNOSIS — Z87891 Personal history of nicotine dependence: Secondary | ICD-10-CM | POA: Diagnosis not present

## 2018-07-01 DIAGNOSIS — I252 Old myocardial infarction: Secondary | ICD-10-CM | POA: Diagnosis not present

## 2018-07-01 DIAGNOSIS — J441 Chronic obstructive pulmonary disease with (acute) exacerbation: Secondary | ICD-10-CM | POA: Diagnosis not present

## 2018-07-01 DIAGNOSIS — I10 Essential (primary) hypertension: Secondary | ICD-10-CM | POA: Diagnosis not present

## 2018-07-09 ENCOUNTER — Other Ambulatory Visit: Payer: Self-pay

## 2018-07-09 NOTE — Patient Outreach (Signed)
[facility]) Care Management  07/09/2018  Jeffery [name] 02-27-1951 [id number]   Telephone Screen  Referral Date: 07/06/2018 Referral Source: [facility] Dept. Referral Reason: " permission to speak with [phone]-assistance with Spirivia,Duooneb, Symbicort and Proventil" Insurance: [facility] Medicare   Outreach attempt #1 to patient. Spoke with spouse and screening completed.   Patient resides in his home along with his spouse. Per spouse he is independent with ADLs/IADLs. He drives himself to medical appts. Spouse reports patient has had no recent falls and uses no assistive devices. Per chart review, patient has PMH of COPD,HERD, HLD and tobacco abuse. Spouse voices that patient is very knowledgeable and is managing his COPD. He has oxygen int he home and uses as needed She denies nay further RN needs or concerns at this time.   Medications: Spouse reports patient taking "a lot" of meds. She is unable to provide count but voices more than 10. She stets that patient manages his meds on his own. He takes them directly from pill bottle. He refuses to use pill box per spouse. She voices that he is having trouble paying for his resp meds-Spiriva,Duonebs, Symbicort and Proventil. She denies patient being currently out of any of his meds.   Appointments: Patient followed by PCP. Spouse states that they are considering getting patient connected with pulmonologist as he saw one several years ago.   Consent:  [facility] services reviewed and discussed with patient. Verbal consent for services given.    Plan: RN CM will send [facility] pharmacy referral for possible med assistance and polypharmacy med review.   Antionette Fairy, RN,BSN,CCM [facility] Care Management Telephonic Care Management Coordinator Direct Phone: [phone] Toll Free: [phone] Fax: [phone]

## 2018-07-13 ENCOUNTER — Ambulatory Visit: Payer: Self-pay | Admitting: Pharmacist

## 2018-07-13 ENCOUNTER — Other Ambulatory Visit: Payer: Self-pay | Admitting: Pharmacist

## 2018-07-13 NOTE — Patient Outreach (Signed)
[facility]) Care Management  [place]   07/13/2018  Jeffery [name] 1951/04/07 [id number]  Reason for referral: Medication Assistance with inhalers  Referral source: [facility] RN Current insurance:[employer]  PMHx includes but not limited to:  COPD, DMT2 (A1c 6.3), HTN, HLD  Outreach:  Successful telephone call with Mr. And Jeffery [name] identifiers verified.  Wife states they are having trouble affording inhalers.  They are okay with transition from Symbicort to [facility].  Made patient and wife aware of process and the time that it will take.  All other medications are affordable and wife states that she ensures patient compliance. Wife states patient is SOB today and doesn't feel good.  Care coordination call to Dr. Joselyn Arrow office to make him aware of patient being SOB and "not feeling good".  RN states that Dr. Romona Curls is aware.  Patient recently just finished prednisone pack and ABx.  Called wife back to relay message.  MD to call patient back if any action is necessary.  Objective: Lab Results  Component Value Date   CREATININE 0.58 (L) 06/20/2018   CREATININE 0.69 06/07/2018   CREATININE 0.66 10/06/2017    Lab Results  Component Value Date   HGBA1C 6.6 (H) 06/03/2015    Lipid Panel     Component Value Date/Time   CHOL 177 07/18/2011 1015   TRIG 113 07/18/2011 1015   HDL 38 (L) 07/18/2011 1015   CHOLHDL 4.7 07/18/2011 1015   VLDL 23 07/18/2011 1015   LDLCALC 116 (H) 07/18/2011 1015    BP Readings from Last 3 Encounters:  06/20/18 105/79  06/07/18 132/84  10/06/17 (!) 166/92    Allergies  Allergen Reactions  . Codeine Nausea Only    Medications Reviewed Today    Reviewed by Lavera Guise, [facility] (Pharmacist) on 07/13/18 at 31  Med List Status: <None>  Medication Order Taking? Sig Documenting Provider Last Dose Status Informant  albuterol (PROVENTIL HFA;VENTOLIN HFA) 108 (90 Base) MCG/ACT inhaler [id number] No Inhale 2  puffs into the lungs every 4 (four) hours as needed for wheezing or shortness of breath.  Patient not taking:  Reported on 06/07/2018   Francine Graven, DO Not Taking Unknown time Active Self  albuterol (PROVENTIL HFA;VENTOLIN HFA) 108 (90 Base) MCG/ACT inhaler [id number] No Inhale 2 puffs into the lungs every 4 (four) hours as needed for wheezing or shortness of breath.  Patient not taking:  Reported on 06/20/2018   Francine Graven, DO Not Taking Unknown time Active Self  albuterol (PROVENTIL) (2.5 MG/3ML) 0.083% nebulizer solution [id number]  Take 3 mLs (2.5 mg total) by nebulization every 4 (four) hours as needed for wheezing or shortness of breath. Francine Graven, DO  Active Self  ALPRAZolam Duanne Moron) 0.5 MG tablet [id number] No Take 0.5 mg by mouth at bedtime. [provider] 06/19/2018 Unknown time Active Self  aspirin EC 81 MG tablet [id number] No Take 81 mg by mouth daily. [provider] 06/20/2018 800 Active Self  budesonide-formoterol (SYMBICORT) 160-4.5 MCG/ACT inhaler [id number] No Inhale 2 puffs into the lungs 2 (two) times daily. [provider] 06/07/2018 Unknown time Active Self           Med Note Lovie Macadamia, DEBBIE G   Tue May 02, 2017  8:57 AM)    chlorthalidone (HYGROTON) 25 MG tablet [id number] No Take 25 mg by mouth daily. [provider] 06/20/2018 Unknown time Active Self  doxycycline (VIBRA-TABS) 100 MG tablet [id number] No Take 1 tablet (100 mg  total) by mouth 2 (two) times daily.  Patient not taking:  Reported on 06/20/2018   Francine Graven, DO Not Taking Unknown time Active Self  gabapentin (NEURONTIN) 300 MG capsule [id number] No Take 300 mg by mouth 2 (two) times daily.  [provider] 06/20/2018 Unknown time Active Self           Med Note Mel Almond, Erling Cruz Apr 02, 2017  5:02 PM)    ipratropium-albuterol (DUONEB) 0.5-2.5 (3) MG/3ML SOLN [id number] No Inhale 3 mLs into the lungs every 6 (six) hours as needed (for shortness of  breath/wheezing).  [provider] 06/07/2018 Unknown time Active Self           Med Note Ahmed Prima, AMBER C   Tue Nov 11, 2014  8:44 AM)     isosorbide mononitrate (IMDUR) 30 MG 24 hr tablet [id number] No Take 30 mg by mouth daily. [provider] 06/20/2018 Unknown time Active Self           Med Note (BRIDGES, PAMELA C   Sat Apr 19, 2014 10:37 PM)    metFORMIN (GLUCOPHAGE) 500 MG tablet [id number] No Take 500 mg by mouth daily. [provider] 06/20/2018 Unknown time Active Self  methylPREDNISolone (MEDROL DOSEPAK) 4 MG TBPK tablet [id number] No  [provider] Completed Course Unknown time Active Self  metoprolol succinate (TOPROL-XL) 50 MG 24 hr tablet [id number] No Take 50 mg by mouth every morning. Take with or immediately following a meal. [provider] 06/20/2018 800 Active Self  multivitamin (ONE-A-DAY MEN'S) TABS [id number] No Take 1 tablet by mouth daily.   [provider] 06/20/2018 Unknown time Active Self  oseltamivir (TAMIFLU) 75 MG capsule [id number] No Take 1 capsule (75 mg total) by mouth every 12 (twelve) hours.  Patient not taking:  Reported on 06/07/2018   Orpah Greek, MD Not Taking Unknown time Active Self  OXYGEN [id number] No Inhale 2 L into the lungs daily as needed.  [provider] 06/07/2018 Unknown time Active Self  polyethylene glycol (MIRALAX / GLYCOLAX) packet [id number] No Take 17 g by mouth daily as needed for mild constipation. [provider] Past Week Unknown time Active Self  potassium chloride SA (K-DUR,KLOR-CON) 20 MEQ tablet [id number] No Take 20 mEq by mouth daily.  [provider] 06/20/2018 Unknown time Active Self           Med Note Trinidad Curet, Celedonio Savage   Thu Mar 10, 2016  8:04 PM)    predniSONE (DELTASONE) 20 MG tablet [id number] No Take 2 tablets (40 mg total) by mouth daily. Francine Graven, DO 06/20/2018 Unknown time Active Self  ranitidine (ZANTAC) 300 MG tablet [id number] No Take 1  tablet by mouth 2 (two) times daily. [provider] 06/20/2018 Unknown time Active Self  simvastatin (ZOCOR) 20 MG tablet [id number] No Take 20 mg by mouth at bedtime.  [provider] 06/19/2018 Unknown time Active Self           Med Note Constance Haw, PAMELA C   Sat Apr 19, 2014 10:37 PM)    tiotropium (SPIRIVA) 18 MCG inhalation capsule [id number] No Place 18 mcg into inhaler and inhale daily. [provider] 06/19/2018 Unknown time Active Self  Med List Note Lyndal Rainbow, CPhT 03/10/16 2005): Medications have been verified via pharmacy records            Assessment:  Drugs sorted by system:  Neurologic/Psychologic: alprazolam, gabapentin  Cardiovascular: ASA, imdur, metoprolol, chlorthalidone, simvastatin  Pulmonary/Allergy: albuterol, Spiriva, Symbicort  Gastrointestinal: ranitidine, miralax  Endocrine: metformin  Vitamins/Minerals/Supplements: MVI, K+  Miscellaneous:   Medication Assistance Findings:   Extra Help:   [] Already receiving Full Extra Help  [] Already receiving Partial Extra Help  [] Eligible based on reported income and assets  [x] Not Eligible based on reported income and assets  Patient Assistance Programs: 1) Spiriva made by [employer] o Income requirement met: [x] Yes [] No [] Unknown o Out-of-pocket prescription expenditure met:    [] Yes [] No  [] Unknown  [x] Not applicable - Patient has met application requirements to apply for this patient assistance program.          2)  Dulera & Proventil made by [employer] o Income requirement met: [x] Yes [] No  [] Unknown o Out-of-pocket prescription expenditure met:   [] Yes [] No   [] Unknown [x] Not applicable - Patient has met application requirements to apply for this patient assistance program.    Additional medication assistance options reviewed with patient as warranted:  No other options identified  Plan: I will route patient assistance letter to [place] technician who will  coordinate patient assistance program application process for medications listed above.  [facility] pharmacy technician will assist with obtaining all required documents from both patient and provider(s) and submit application(s) once completed.      Regina Eck, PharmD, [place]  [phone]

## 2018-07-17 ENCOUNTER — Other Ambulatory Visit: Payer: Self-pay | Admitting: Pharmacy Technician

## 2018-07-17 NOTE — Patient Outreach (Signed)
[facility]) Care Management  07/17/2018  Jeffery [name] 29-Mar-1951 [id number]                          Medication Assistance Referral  Referral From: [facility] RPh Kandra Nicolas  Medication/Company: Elwin Sleight and Proventil HFA / [name] Patient application portion:  Mailed Provider application portion:  Mailed to Dr. Olena Leatherwood  Medication/Company: [employer] / [employer] Patient application portion:  Mailed Provider application portion: Faxed  to Dr. Olena Leatherwood   Follow up:  Will follow up with patient in 7-10 business days to confirm application(s) have been received.  Suzan Slick Effie Shy CPhT Certified Pharmacy Technician [facility] Care Management Direct Dial:[phone]

## 2018-07-22 ENCOUNTER — Emergency Department (HOSPITAL_COMMUNITY)
Admission: EM | Admit: 2018-07-22 | Discharge: 2018-07-22 | Disposition: A | Discharge status: Home / Self Care | Payer: Medicare Other | Attending: Emergency Medicine | Admitting: Emergency Medicine

## 2018-07-22 ENCOUNTER — Encounter (HOSPITAL_COMMUNITY): Payer: Self-pay | Admitting: Emergency Medicine

## 2018-07-22 ENCOUNTER — Other Ambulatory Visit: Payer: Self-pay

## 2018-07-22 ENCOUNTER — Emergency Department (HOSPITAL_COMMUNITY): Payer: Medicare Other

## 2018-07-22 DIAGNOSIS — J439 Emphysema, unspecified: Secondary | ICD-10-CM | POA: Diagnosis not present

## 2018-07-22 DIAGNOSIS — Z9981 Dependence on supplemental oxygen: Secondary | ICD-10-CM | POA: Diagnosis not present

## 2018-07-22 DIAGNOSIS — R7303 Prediabetes: Secondary | ICD-10-CM | POA: Insufficient documentation

## 2018-07-22 DIAGNOSIS — I10 Essential (primary) hypertension: Secondary | ICD-10-CM | POA: Insufficient documentation

## 2018-07-22 DIAGNOSIS — Z7982 Long term (current) use of aspirin: Secondary | ICD-10-CM | POA: Insufficient documentation

## 2018-07-22 DIAGNOSIS — Z79899 Other long term (current) drug therapy: Secondary | ICD-10-CM | POA: Insufficient documentation

## 2018-07-22 DIAGNOSIS — J441 Chronic obstructive pulmonary disease with (acute) exacerbation: Secondary | ICD-10-CM | POA: Insufficient documentation

## 2018-07-22 DIAGNOSIS — Z87891 Personal history of nicotine dependence: Secondary | ICD-10-CM | POA: Insufficient documentation

## 2018-07-22 DIAGNOSIS — Z7984 Long term (current) use of oral hypoglycemic drugs: Secondary | ICD-10-CM | POA: Insufficient documentation

## 2018-07-22 DIAGNOSIS — R062 Wheezing: Secondary | ICD-10-CM | POA: Diagnosis present

## 2018-07-22 DIAGNOSIS — R0602 Shortness of breath: Secondary | ICD-10-CM | POA: Diagnosis not present

## 2018-07-22 LAB — BASIC METABOLIC PANEL
Anion gap: 11 (ref 5–15)
BUN: 8 mg/dL (ref 8–23)
CHLORIDE: 89 mmol/L — AB (ref 98–111)
CO2: 34 mmol/L — ABNORMAL HIGH (ref 22–32)
Calcium: 9.5 mg/dL (ref 8.9–10.3)
Creatinine, Ser: 0.6 mg/dL — ABNORMAL LOW (ref 0.61–1.24)
GFR calc non Af Amer: >60 mL/min (ref >60)
Glucose, Bld: 115 mg/dL — ABNORMAL HIGH (ref 70–99)
Potassium: 3.2 mmol/L — ABNORMAL LOW (ref 3.5–5.1)
Sodium: 134 mmol/L — ABNORMAL LOW (ref 135–145)

## 2018-07-22 LAB — CBC
HEMATOCRIT: 41.3 % (ref 39.0–52.0)
Hemoglobin: 13.1 g/dL (ref 13.0–17.0)
MCH: 28.5 pg (ref 26.0–34.0)
MCHC: 31.7 g/dL (ref 30.0–36.0)
MCV: 90 fL (ref 80.0–100.0)
Platelets: 293 10*3/uL (ref 150–400)
RBC: 4.59 MIL/uL (ref 4.22–5.81)
RDW: 15.3 % (ref 11.5–15.5)
WBC: 11.3 10*3/uL — AB (ref 4.0–10.5)
nRBC: 0 % (ref 0.0–0.2)

## 2018-07-22 LAB — TROPONIN I: Troponin I: <0.03 ng/mL (ref <0.03)

## 2018-07-22 MED ORDER — METHYLPREDNISOLONE SODIUM SUCC 125 MG IJ SOLR
125.0000 mg | Freq: Once | INTRAMUSCULAR | Status: AC
  Administered 2018-07-22: 125 mg via INTRAVENOUS
  Filled 2018-07-22: qty 2

## 2018-07-22 MED ORDER — ALBUTEROL (5 MG/ML) CONTINUOUS INHALATION SOLN
10.0000 mg/h | INHALATION_SOLUTION | Freq: Once | RESPIRATORY_TRACT | Status: AC
  Administered 2018-07-22: 10 mg/h via RESPIRATORY_TRACT
  Filled 2018-07-22: qty 20

## 2018-07-22 MED ORDER — DOXYCYCLINE HYCLATE 100 MG PO TABS: 100.0000 mg | ORAL_TABLET | Freq: Two times a day (BID) | ORAL | 0 refills | Status: DC

## 2018-07-22 MED ORDER — ALBUTEROL SULFATE (2.5 MG/3ML) 0.083% IN NEBU: 5.0000 mg | INHALATION_SOLUTION | Freq: Once | RESPIRATORY_TRACT | Status: DC

## 2018-07-22 MED ORDER — POTASSIUM CHLORIDE CRYS ER 20 MEQ PO TBCR
20.0000 meq | EXTENDED_RELEASE_TABLET | Freq: Once | ORAL | Status: AC
  Administered 2018-07-22: 20 meq via ORAL
  Filled 2018-07-22: qty 1

## 2018-07-22 MED ORDER — IPRATROPIUM BROMIDE 0.02 % IN SOLN
1.0000 mg | Freq: Once | RESPIRATORY_TRACT | Status: AC
  Administered 2018-07-22: 1 mg via RESPIRATORY_TRACT
  Filled 2018-07-22: qty 5

## 2018-07-22 MED ORDER — PREDNISONE 20 MG PO TABS: 40.0000 mg | ORAL_TABLET | Freq: Every day | ORAL | 0 refills | Status: DC

## 2018-07-22 NOTE — Discharge Instructions (Signed)
Take the prescriptions as directed.  Use your albuterol inhaler (2 to 4 puffs) or your albuterol nebulizer (1 unit dose) every 4 hours for the next 7 days, then as needed for cough, wheezing, or shortness of breath.  Call your regular medical doctor tomorrow morning to schedule a follow up appointment within the next 2 days.  Return to the Emergency Department immediately sooner if worsening.  ° °

## 2018-07-22 NOTE — ED Provider Notes (Signed)
[facility] EMERGENCY DEPARTMENT Provider Note   CSN: [id number] Arrival date & time: 07/22/18  1143    History   Chief Complaint Chief Complaint  Patient presents with  . Shortness of Breath    HPI Jeffery [name] is a 68 y.o. male.     HPI  Pt was seen at 1210.  Per pt, c/o gradual onset and worsening of persistent wheezing for the past 2 to 3 days.  Describes his symptoms as "my COPD."  Denies cough/SOB. Pt states he has been using home O2 N/V, MDI and nebs without relief.  Denies CP/palpitations, no back pain, no abd pain, no N/V/D, no fevers, no rash.    Past Medical History:  Diagnosis Date  . Chest pain 04/2011   Normal echocardiogram  . COPD (chronic obstructive pulmonary disease) (HCC)   . Fasting hyperglycemia    Normal hemoglobin A1c of 6.0  . GERD (gastroesophageal reflux disease)   . Hyperlipemia   . Hypertension   . On home O2    2L N/C  . On home O2    at night  . Prediabetes 06/04/2015  . Tobacco abuse     Patient Active Problem List   Diagnosis Date Noted  . Right middle lobe pneumonia (HCC) 11/07/2016  . Acute on chronic respiratory failure with hypoxia (HCC) 11/07/2016  . Prediabetes 06/04/2015  . Hyperglycemia, drug-induced 06/03/2015  . Essential hypertension 06/03/2015  . COPD exacerbation (HCC) 06/02/2015  . Hyperlipidemia 07/20/2011  . Fasting hyperglycemia   . Hypertension   . Tobacco abuse   . Chest pain 04/02/2011    Past Surgical History:  Procedure Laterality Date  . APPENDECTOMY    . APPENDECTOMY    . LEFT HEART CATHETERIZATION WITH CORONARY ANGIOGRAM N/A 05/17/2011   Procedure: LEFT HEART CATHETERIZATION WITH CORONARY ANGIOGRAM;  Surgeon: Tonny Bollman, MD;  Location: [facility] CATH LAB;  Service: Cardiovascular;  Laterality: N/A;        Home Medications    Prior to Admission medications   Medication Sig Start Date End Date Taking? Authorizing Provider  albuterol (PROVENTIL HFA;VENTOLIN HFA) 108 (90 Base) MCG/ACT inhaler  Inhale 2 puffs into the lungs every 4 (four) hours as needed for wheezing or shortness of breath. 03/10/16   Samuel Jester, DO  albuterol (PROVENTIL) (2.5 MG/3ML) 0.083% nebulizer solution Take 3 mLs (2.5 mg total) by nebulization every 4 (four) hours as needed for wheezing or shortness of breath. 06/07/18   Samuel Jester, DO  ALPRAZolam Prudy Feeler) 0.5 MG tablet Take 0.5 mg by mouth at bedtime.    [provider]  aspirin EC 81 MG tablet Take 81 mg by mouth daily.    [provider]  budesonide-formoterol (SYMBICORT) 160-4.5 MCG/ACT inhaler Inhale 2 puffs into the lungs 2 (two) times daily.    [provider]  chlorthalidone (HYGROTON) 25 MG tablet Take 25 mg by mouth daily.    [provider]  gabapentin (NEURONTIN) 300 MG capsule Take 300 mg by mouth 2 (two) times daily.  03/22/13   [provider]  ipratropium-albuterol (DUONEB) 0.5-2.5 (3) MG/3ML SOLN Inhale 3 mLs into the lungs every 6 (six) hours as needed (for shortness of breath/wheezing).  07/17/14   [provider]  isosorbide mononitrate (IMDUR) 30 MG 24 hr tablet Take 30 mg by mouth daily. 03/22/14   [provider]  metFORMIN (GLUCOPHAGE) 500 MG tablet Take 500 mg by mouth daily.    [provider]  metoprolol succinate (TOPROL-XL) 50 MG 24 hr  tablet Take 50 mg by mouth every morning. Take with or immediately following a meal.    [provider]  multivitamin (ONE-A-DAY MEN'S) TABS Take 1 tablet by mouth daily.      [provider]  OXYGEN Inhale 2 L into the lungs daily as needed.     [provider]  polyethylene glycol (MIRALAX / GLYCOLAX) packet Take 17 g by mouth daily as needed for mild constipation.    [provider]  potassium chloride SA (K-DUR,KLOR-CON) 20 MEQ tablet Take 20 mEq by mouth daily.  05/12/14   [provider]  ranitidine (ZANTAC) 300 MG tablet Take 1 tablet by mouth 2 (two) times daily. 06/12/18    [provider]  simvastatin (ZOCOR) 20 MG tablet Take 20 mg by mouth at bedtime.  03/22/14   [provider]  tiotropium (SPIRIVA) 18 MCG inhalation capsule Place 18 mcg into inhaler and inhale daily.    [provider]    Family History Family History  Problem Relation Age of Onset  . Emphysema Father     Social History Social History   Tobacco Use  . Smoking status: Former Smoker    Packs/day: 1.00    Years: 35.00    Pack years: 35.00    Types: Cigarettes    Last attempt to quit: 05/09/2013    Years since quitting: 5.2  . Smokeless tobacco: Never Used  Substance Use Topics  . Alcohol use: No  . Drug use: No     Allergies   Codeine   Review of Systems Review of Systems ROS: Statement: All systems negative except as marked or noted in the HPI; Constitutional: Negative for fever and chills. ; ; Eyes: Negative for eye pain, redness and discharge. ; ; ENMT: Negative for ear pain, hoarseness, nasal congestion, sinus pressure and sore throat. ; ; Cardiovascular: Negative for chest pain, palpitations, diaphoresis, dyspnea and peripheral edema. ; ; Respiratory: +wheezing. Negative for cough and stridor. ; ; Gastrointestinal: Negative for nausea, vomiting, diarrhea, abdominal pain, blood in stool, hematemesis, jaundice and rectal bleeding. . ; ; Genitourinary: Negative for dysuria, flank pain and hematuria. ; ; Musculoskeletal: Negative for back pain and neck pain. Negative for swelling and trauma.; ; Skin: Negative for pruritus, rash, abrasions, blisters, bruising and skin lesion.; ; Neuro: Negative for headache, lightheadedness and neck stiffness. Negative for weakness, altered level of consciousness, altered mental status, extremity weakness, paresthesias, involuntary movement, seizure and syncope.       Physical Exam Updated Vital Signs BP (!) 141/83 (BP Location: Right Arm)   Pulse 73   Temp 98.1 F (36.7 C) (Oral)   Resp 18   SpO2 96%    Physical Exam 1215: Physical examination:  Nursing notes reviewed; Vital signs and O2 SAT reviewed;  Constitutional: Well developed, Well nourished, Well hydrated, Uncomfortable appearing.;; Head:  Normocephalic, atraumatic; Eyes: EOMI, PERRL, No scleral icterus; ENMT: Mouth and pharynx normal, Mucous membranes moist; Neck: Supple, Full range of motion, No lymphadenopathy; Cardiovascular: Regular rate and rhythm, No gallop; Respiratory: Breath sounds diminished & equal bilaterally, insp/exp wheezes bilat. Occasional faint audible wheezing.  Speaking short sentences, sitting upright, mildly tachypneic.; Chest: Nontender, Movement normal; Abdomen: Soft, Nontender, Nondistended, Normal bowel sounds; Genitourinary: No CVA tenderness; Extremities: Pulses normal, No tenderness, No edema, No calf edema or asymmetry.; Neuro: AA&Ox3, Major CN grossly intact.  Speech clear. No gross focal motor or sensory deficits in extremities.; Skin: Color normal, Warm, Dry.    ED Treatments / Results  Labs (all labs ordered are listed, but only abnormal results are displayed)   EKG EKG Interpretation  Date/Time:  Sunday July 22 2018 11:53:56 EDT Ventricular Rate:  73 PR Interval:    QRS Duration: 99 QT Interval:  367 QTC Calculation: 405 R Axis:   81 Text Interpretation:  Sinus rhythm Biatrial enlargement Borderline right axis deviation Nonspecific T abnormalities, lateral leads Baseline wander Artifact When compared with ECG of 06/20/2018 No significant change was found Confirmed by Samuel Jester [phone]) on 07/22/2018 12:13:18 PM   Radiology   Procedures Procedures (including critical care time)  Medications Ordered in ED Medications  albuterol (PROVENTIL,VENTOLIN) solution continuous neb (has no administration in time range)  ipratropium (ATROVENT) nebulizer solution 1 mg (has no administration in time range)  methylPREDNISolone sodium succinate (SOLU-MEDROL) 125 mg/2 mL injection 125 mg (125 mg  Intravenous Given 07/22/18 1218)     Initial Impression / Assessment and Plan / ED Course  I have reviewed the triage vital signs and the nursing notes.  Pertinent labs & imaging results that were available during my care of the patient were reviewed by me and considered in my medical decision making (see chart for details).     MDM Reviewed: previous chart, nursing note and vitals Reviewed previous: labs and ECG Interpretation: labs, ECG and x-ray Total time providing critical care: 30-74 minutes. This excludes time spent performing separately reportable procedures and services.   CRITICAL CARE Performed by: Samuel Jester Total critical care time: 35 minutes Critical care time was exclusive of separately billable procedures and treating other patients. Critical care was necessary to treat or prevent imminent or life-threatening deterioration. Critical care was time spent personally by me on the following activities: development of treatment plan with patient and/or surrogate as well as nursing, discussions with consultants, evaluation of patient's response to treatment, examination of patient, obtaining history from patient or surrogate, ordering and performing treatments and interventions, ordering and review of laboratory studies, ordering and review of radiographic studies, pulse oximetry and re-evaluation of patient's condition.   Results for orders placed or performed during the hospital encounter of 07/22/18  Basic metabolic panel  Result Value Ref Range   Sodium 134 (L) 135 - 145 mmol/L   Potassium 3.2 (L) 3.5 - 5.1 mmol/L   Chloride 89 (L) 98 - 111 mmol/L   CO2 34 (H) 22 - 32 mmol/L   Glucose, Bld 115 (H) 70 - 99 mg/dL   BUN 8 8 - 23 mg/dL   Creatinine, Ser 6.04 (L) 0.61 - 1.24 mg/dL   Calcium 9.5 8.9 - 54.0 mg/dL   GFR calc non Af Amer >60 >60 mL/min   GFR calc Af Amer >60 >60 mL/min   Anion gap 11 5 - 15  CBC  Result Value Ref Range   WBC 11.3 (H) 4.0 - 10.5  K/uL   RBC 4.59 4.22 - 5.81 MIL/uL   Hemoglobin 13.1 13.0 - 17.0 g/dL   HCT 98.1 19.1 - 47.8 %   MCV 90.0 80.0 - 100.0 fL   MCH 28.5 26.0 - 34.0 pg   MCHC 31.7 30.0 - 36.0 g/dL   RDW 29.5 62.1 - 30.8 %   Platelets 293 150 - 400 K/uL   nRBC 0.0 0.0 - 0.2 %  Troponin I - Once  Result Value Ref Range   Troponin I <0.03 <0.03 ng/mL   Dg Chest 2 View Result Date: 07/22/2018 CLINICAL DATA:  COPD exacerbation 3 days. EXAM: CHEST - 2 VIEW COMPARISON:  07/01/2018 and 05/23/2018 FINDINGS: Lungs are hyperexpanded with emphysematous disease. Minimal scarring right middle lobe. No focal lobar consolidation or effusion. Cardiomediastinal silhouette and remainder of the exam is unchanged. IMPRESSION: No acute cardiopulmonary disease. Emphysematous disease. Electronically Signed   By: Elberta Fortis M.D.   On: 07/22/2018 12:44     1410:  Pt states he "feels better" after hour long neb and IV steroid.  NAD, lungs CTA bilat, no wheezing, resps easy, speaking full sentences, Sats 100% on his usual O2 2L N/C.  Pt ambulated around the ED with Sats remaining 100 % on his usual O2 2L N/C, resps easy, NAD.  Pt states he wants to go home now. No clear indication for admission at this time. Tx symptomatically, f/u PMD. Pt states he "has enough" albuterol neb solution and does not need rx.  Dx and testing d/w pt.  Questions answered.  Verb understanding, agreeable to d/c home with outpt f/u.     Final Clinical Impressions(s) / ED Diagnoses   Final diagnoses:  None    ED Discharge Orders    None       Samuel Jester, DO 07/25/18 1408

## 2018-07-22 NOTE — ED Triage Notes (Signed)
Patient reports COPD flare that started on Thursday.

## 2018-07-23 NOTE — Addendum Note (Signed)
Addended by: Vanice Sarah D on: 07/23/2018 10:49 AM   Modules accepted: Orders

## 2018-07-27 ENCOUNTER — Other Ambulatory Visit: Payer: Self-pay

## 2018-07-27 NOTE — Patient Outreach (Signed)
[facility]) Care Management  07/27/2018  STEVAN BAKKEN 01/17/1951 [id number]     Successful outreach with Mr. Guilfoyle. HIPAA identifiers obtained. Screening was performed by [facility] Telephonic RNCM earlier this month. Per chart review, spouse declined need for additional nursing services. Since initial screening patient was evaluated in the ED for COPD related complications. Today he reports feeling well. Denies complaints of shortness of breath or worsening symptoms.  Reports his primary concern is receiving assistance with  cost of medications. [facility] is engaged. Reports not requiring assistance with COPD management but may require assistance with coordinating care with a Pulmonologist. Unable to recall if a referral was submitted.  Agreeable to [facility] contacting MD and completing follow-up outreach on next week. PLAN Will contact PCP. Will follow up with Mr. Ammann on next week.   Katha Cabal [facility] Community Care Manager [phone]

## 2018-08-01 DIAGNOSIS — E785 Hyperlipidemia, unspecified: Secondary | ICD-10-CM | POA: Diagnosis not present

## 2018-08-01 DIAGNOSIS — E119 Type 2 diabetes mellitus without complications: Secondary | ICD-10-CM | POA: Diagnosis not present

## 2018-08-01 DIAGNOSIS — I10 Essential (primary) hypertension: Secondary | ICD-10-CM | POA: Diagnosis not present

## 2018-08-01 DIAGNOSIS — J441 Chronic obstructive pulmonary disease with (acute) exacerbation: Secondary | ICD-10-CM | POA: Diagnosis not present

## 2018-08-02 ENCOUNTER — Other Ambulatory Visit: Payer: Self-pay

## 2018-08-02 DIAGNOSIS — J9611 Chronic respiratory failure with hypoxia: Secondary | ICD-10-CM | POA: Diagnosis not present

## 2018-08-02 DIAGNOSIS — J449 Chronic obstructive pulmonary disease, unspecified: Secondary | ICD-10-CM | POA: Diagnosis not present

## 2018-08-02 NOTE — Patient Outreach (Signed)
[facility]) Care Management  08/02/2018  Jeffery [name] [birth year] [id number]   Case closure complete. Spoke with Jeffery [name] and his spouse. He was evaluated by Dr. Olena Leatherwood on yesterday and has a pending Pulmonology referral to Dr. Juanetta Gosling. RNCM contact information provided. They were encouraged to call if his health status changes and chronic disease management is needed.  PLAN Will update [facility] team. Will update PCP and complete case closure.  Katha Cabal [facility] Community Care Manager [phone]

## 2018-08-07 ENCOUNTER — Other Ambulatory Visit: Payer: Self-pay | Admitting: Pharmacy Technician

## 2018-08-07 DIAGNOSIS — E119 Type 2 diabetes mellitus without complications: Secondary | ICD-10-CM | POA: Diagnosis not present

## 2018-08-07 DIAGNOSIS — Z87891 Personal history of nicotine dependence: Secondary | ICD-10-CM | POA: Diagnosis not present

## 2018-08-07 DIAGNOSIS — K5900 Constipation, unspecified: Secondary | ICD-10-CM | POA: Diagnosis not present

## 2018-08-07 DIAGNOSIS — E785 Hyperlipidemia, unspecified: Secondary | ICD-10-CM | POA: Diagnosis not present

## 2018-08-07 DIAGNOSIS — Z7952 Long term (current) use of systemic steroids: Secondary | ICD-10-CM | POA: Diagnosis not present

## 2018-08-07 DIAGNOSIS — R0602 Shortness of breath: Secondary | ICD-10-CM | POA: Diagnosis not present

## 2018-08-07 DIAGNOSIS — Z79899 Other long term (current) drug therapy: Secondary | ICD-10-CM | POA: Diagnosis not present

## 2018-08-07 DIAGNOSIS — I10 Essential (primary) hypertension: Secondary | ICD-10-CM | POA: Diagnosis not present

## 2018-08-07 DIAGNOSIS — I2510 Atherosclerotic heart disease of native coronary artery without angina pectoris: Secondary | ICD-10-CM | POA: Diagnosis not present

## 2018-08-07 DIAGNOSIS — Z7951 Long term (current) use of inhaled steroids: Secondary | ICD-10-CM | POA: Diagnosis not present

## 2018-08-07 DIAGNOSIS — Z886 Allergy status to analgesic agent status: Secondary | ICD-10-CM | POA: Diagnosis not present

## 2018-08-07 DIAGNOSIS — Z8249 Family history of ischemic heart disease and other diseases of the circulatory system: Secondary | ICD-10-CM | POA: Diagnosis not present

## 2018-08-07 DIAGNOSIS — Z836 Family history of other diseases of the respiratory system: Secondary | ICD-10-CM | POA: Diagnosis not present

## 2018-08-07 DIAGNOSIS — K449 Diaphragmatic hernia without obstruction or gangrene: Secondary | ICD-10-CM | POA: Diagnosis not present

## 2018-08-07 DIAGNOSIS — G6289 Other specified polyneuropathies: Secondary | ICD-10-CM | POA: Diagnosis not present

## 2018-08-07 DIAGNOSIS — Z7984 Long term (current) use of oral hypoglycemic drugs: Secondary | ICD-10-CM | POA: Diagnosis not present

## 2018-08-07 DIAGNOSIS — J441 Chronic obstructive pulmonary disease with (acute) exacerbation: Secondary | ICD-10-CM | POA: Diagnosis not present

## 2018-08-07 NOTE — Patient Outreach (Signed)
[facility]) Care Management  08/07/2018  Jeffery [name] Jul 15, 1950 [id number]   Received patient portion(s) of patient assistance application for Spiriva, Elwin Sleight and Proventil HFA.  Refaxed provider portion of Spiriva to Dr. Olena Leatherwood for completion.  Sent message to [facility] Vanice Sarah to confirm [facility] dose/ directions.  Unsuccessful call to patient wife to verify income items that were written on applications. HIPAA compliant voicemail left.  Will make 2nd call attempt in 2-3 business days if call has not been returned.  Suzan Slick Effie Shy CPhT Certified Pharmacy Technician [facility] Care Management Direct Dial:[phone]

## 2018-08-08 ENCOUNTER — Other Ambulatory Visit: Payer: Self-pay | Admitting: Pharmacist

## 2018-08-08 DIAGNOSIS — Z79899 Other long term (current) drug therapy: Secondary | ICD-10-CM | POA: Diagnosis not present

## 2018-08-08 DIAGNOSIS — Z7952 Long term (current) use of systemic steroids: Secondary | ICD-10-CM | POA: Diagnosis not present

## 2018-08-08 DIAGNOSIS — Z7951 Long term (current) use of inhaled steroids: Secondary | ICD-10-CM | POA: Diagnosis not present

## 2018-08-08 DIAGNOSIS — I2510 Atherosclerotic heart disease of native coronary artery without angina pectoris: Secondary | ICD-10-CM | POA: Diagnosis not present

## 2018-08-08 DIAGNOSIS — K449 Diaphragmatic hernia without obstruction or gangrene: Secondary | ICD-10-CM | POA: Diagnosis not present

## 2018-08-08 DIAGNOSIS — Z886 Allergy status to analgesic agent status: Secondary | ICD-10-CM | POA: Diagnosis not present

## 2018-08-08 DIAGNOSIS — Z87891 Personal history of nicotine dependence: Secondary | ICD-10-CM | POA: Diagnosis not present

## 2018-08-08 DIAGNOSIS — Z8249 Family history of ischemic heart disease and other diseases of the circulatory system: Secondary | ICD-10-CM | POA: Diagnosis not present

## 2018-08-08 DIAGNOSIS — J441 Chronic obstructive pulmonary disease with (acute) exacerbation: Secondary | ICD-10-CM | POA: Diagnosis not present

## 2018-08-08 DIAGNOSIS — Z7984 Long term (current) use of oral hypoglycemic drugs: Secondary | ICD-10-CM | POA: Diagnosis not present

## 2018-08-08 DIAGNOSIS — K5900 Constipation, unspecified: Secondary | ICD-10-CM | POA: Diagnosis not present

## 2018-08-08 DIAGNOSIS — Z836 Family history of other diseases of the respiratory system: Secondary | ICD-10-CM | POA: Diagnosis not present

## 2018-08-08 DIAGNOSIS — I10 Essential (primary) hypertension: Secondary | ICD-10-CM | POA: Diagnosis not present

## 2018-08-08 DIAGNOSIS — E119 Type 2 diabetes mellitus without complications: Secondary | ICD-10-CM | POA: Diagnosis not present

## 2018-08-08 DIAGNOSIS — G6289 Other specified polyneuropathies: Secondary | ICD-10-CM | POA: Diagnosis not present

## 2018-08-08 DIAGNOSIS — E785 Hyperlipidemia, unspecified: Secondary | ICD-10-CM | POA: Diagnosis not present

## 2018-08-08 NOTE — Patient Outreach (Signed)
[facility]) Care Management  [facility]  08/08/2018  Jeffery [name] [birth year] [id number]   Reason for referral: Medication Assistance with inhalers  Referral source: [facility] RN Current insurance: [employer]  Outreach:  PAP application returned by PCP Dr. Lia Hopping.  RX Instructions for Dulera 200 state "1 puff BID".  Patient was previously on Symbicort 160 "2 puffs BID".  Dulera 200 RX sig should state "2 puffs BID".  Message left with RN Erskine Squibb to clarify.   Plan:  -I will follow up as information is discovered.  Kieth Brightly, PharmD, [facility] Clinical Pharmacist [facility]  [phone]

## 2018-08-09 DIAGNOSIS — Z836 Family history of other diseases of the respiratory system: Secondary | ICD-10-CM | POA: Diagnosis not present

## 2018-08-09 DIAGNOSIS — K449 Diaphragmatic hernia without obstruction or gangrene: Secondary | ICD-10-CM | POA: Diagnosis not present

## 2018-08-09 DIAGNOSIS — E785 Hyperlipidemia, unspecified: Secondary | ICD-10-CM | POA: Diagnosis not present

## 2018-08-09 DIAGNOSIS — K5900 Constipation, unspecified: Secondary | ICD-10-CM | POA: Diagnosis not present

## 2018-08-09 DIAGNOSIS — J441 Chronic obstructive pulmonary disease with (acute) exacerbation: Secondary | ICD-10-CM | POA: Diagnosis not present

## 2018-08-09 DIAGNOSIS — Z87891 Personal history of nicotine dependence: Secondary | ICD-10-CM | POA: Diagnosis not present

## 2018-08-09 DIAGNOSIS — Z886 Allergy status to analgesic agent status: Secondary | ICD-10-CM | POA: Diagnosis not present

## 2018-08-09 DIAGNOSIS — Z7952 Long term (current) use of systemic steroids: Secondary | ICD-10-CM | POA: Diagnosis not present

## 2018-08-09 DIAGNOSIS — Z8249 Family history of ischemic heart disease and other diseases of the circulatory system: Secondary | ICD-10-CM | POA: Diagnosis not present

## 2018-08-09 DIAGNOSIS — I2510 Atherosclerotic heart disease of native coronary artery without angina pectoris: Secondary | ICD-10-CM | POA: Diagnosis not present

## 2018-08-09 DIAGNOSIS — Z7984 Long term (current) use of oral hypoglycemic drugs: Secondary | ICD-10-CM | POA: Diagnosis not present

## 2018-08-09 DIAGNOSIS — Z7951 Long term (current) use of inhaled steroids: Secondary | ICD-10-CM | POA: Diagnosis not present

## 2018-08-09 DIAGNOSIS — I10 Essential (primary) hypertension: Secondary | ICD-10-CM | POA: Diagnosis not present

## 2018-08-09 DIAGNOSIS — Z79899 Other long term (current) drug therapy: Secondary | ICD-10-CM | POA: Diagnosis not present

## 2018-08-09 DIAGNOSIS — G6289 Other specified polyneuropathies: Secondary | ICD-10-CM | POA: Diagnosis not present

## 2018-08-09 DIAGNOSIS — E119 Type 2 diabetes mellitus without complications: Secondary | ICD-10-CM | POA: Diagnosis not present

## 2018-08-13 ENCOUNTER — Other Ambulatory Visit: Payer: Self-pay | Admitting: Pharmacy Technician

## 2018-08-13 NOTE — Patient Outreach (Signed)
[facility]) Care Management  08/13/2018  LAURIER CAMBRON [birth year] [id number]    Successful call placed to patients [facility] regarding patient assistance application(s) for Spiriva , HIPAA identifiers verified. Clarified with Mrs. Baptist about asset portion of patient application that was filled out.  Faxed completed application and required documents to [employer].  Follow up:  Will follow up with [employer] in 7-10 business day to check application status.  Suzan Slick Effie Shy CPhT Certified Pharmacy Technician [facility] Care Management Direct [phone]

## 2018-08-15 DIAGNOSIS — J449 Chronic obstructive pulmonary disease, unspecified: Secondary | ICD-10-CM | POA: Diagnosis not present

## 2018-08-20 ENCOUNTER — Other Ambulatory Visit: Payer: Self-pay | Admitting: Pharmacy Technician

## 2018-08-20 NOTE — Patient Outreach (Signed)
[facility]) Care Management  08/20/2018  MIKIE CAVENY 02-Mar-1951 [id number]    Follow up call placed to [employer] regarding patient assistance application(s) for Spiriva , Lelon Mast confirms patient has been approved as of 4/13 until 05/02/19. Medication due to be delivered 4/23.   Follow up call placed to [name] regarding patient assistance application(s) for [facility] and Proventil HFA , Harrold Donath confirms application was received. Attestation form mailed out to patient on 4/14.   Successful call placed to patients wife regarding patient assistance update for Dulera, Proventil HFA and Spiriva, HIPAA identifiers verified. Mrs. Mitter conforms that attestation form was received in the mail today. Assisted her with filling it out.  Also informed her of Spiriva approval and shipment info.  Follow up:  Will follow up with [name] in 7-10 business days to check status of application.  Suzan Slick Effie Shy CPhT Certified Pharmacy Technician [facility] Care Management Direct [phone]

## 2018-08-26 ENCOUNTER — Other Ambulatory Visit: Payer: Self-pay

## 2018-08-26 ENCOUNTER — Emergency Department (HOSPITAL_COMMUNITY): Payer: Medicare Other

## 2018-08-26 ENCOUNTER — Encounter (HOSPITAL_COMMUNITY): Payer: Self-pay | Admitting: Emergency Medicine

## 2018-08-26 ENCOUNTER — Emergency Department (HOSPITAL_COMMUNITY)
Admission: EM | Admit: 2018-08-26 | Discharge: 2018-08-26 | Disposition: A | Discharge status: Home / Self Care | Payer: Medicare Other | Attending: Emergency Medicine | Admitting: Emergency Medicine

## 2018-08-26 DIAGNOSIS — Z79899 Other long term (current) drug therapy: Secondary | ICD-10-CM | POA: Diagnosis not present

## 2018-08-26 DIAGNOSIS — I10 Essential (primary) hypertension: Secondary | ICD-10-CM | POA: Diagnosis not present

## 2018-08-26 DIAGNOSIS — J441 Chronic obstructive pulmonary disease with (acute) exacerbation: Secondary | ICD-10-CM | POA: Insufficient documentation

## 2018-08-26 DIAGNOSIS — Z87891 Personal history of nicotine dependence: Secondary | ICD-10-CM | POA: Diagnosis not present

## 2018-08-26 DIAGNOSIS — R0602 Shortness of breath: Secondary | ICD-10-CM | POA: Diagnosis present

## 2018-08-26 DIAGNOSIS — Z7982 Long term (current) use of aspirin: Secondary | ICD-10-CM | POA: Diagnosis not present

## 2018-08-26 DIAGNOSIS — J984 Other disorders of lung: Secondary | ICD-10-CM | POA: Diagnosis not present

## 2018-08-26 LAB — CBC WITH DIFFERENTIAL/PLATELET
Abs Immature Granulocytes: 0.09 10*3/uL — ABNORMAL HIGH (ref 0.00–0.07)
Basophils Absolute: 0 10*3/uL (ref 0.0–0.1)
Basophils Relative: 1 %
Eosinophils Absolute: 0.3 10*3/uL (ref 0.0–0.5)
Eosinophils Relative: 4 %
HCT: 35.5 % — ABNORMAL LOW (ref 39.0–52.0)
Hemoglobin: 11.3 g/dL — ABNORMAL LOW (ref 13.0–17.0)
Immature Granulocytes: 1 %
Lymphocytes Relative: 11 %
Lymphs Abs: 0.9 10*3/uL (ref 0.7–4.0)
MCH: 29 pg (ref 26.0–34.0)
MCHC: 31.8 g/dL (ref 30.0–36.0)
MCV: 91 fL (ref 80.0–100.0)
Monocytes Absolute: 1.1 10*3/uL — ABNORMAL HIGH (ref 0.1–1.0)
Monocytes Relative: 14 %
Neutro Abs: 5.6 10*3/uL (ref 1.7–7.7)
Neutrophils Relative %: 69 %
Platelets: 268 10*3/uL (ref 150–400)
RBC: 3.9 MIL/uL — ABNORMAL LOW (ref 4.22–5.81)
RDW: 14.8 % (ref 11.5–15.5)
WBC: 8 10*3/uL (ref 4.0–10.5)
nRBC: 0 % (ref 0.0–0.2)

## 2018-08-26 LAB — TROPONIN I: Troponin I: <0.03 ng/mL (ref <0.03)

## 2018-08-26 LAB — COMPREHENSIVE METABOLIC PANEL
ALT: 22 U/L (ref 0–44)
AST: 14 U/L — ABNORMAL LOW (ref 15–41)
Albumin: 3.8 g/dL (ref 3.5–5.0)
Alkaline Phosphatase: 70 U/L (ref 38–126)
Anion gap: 11 (ref 5–15)
BUN: 12 mg/dL (ref 8–23)
CO2: 29 mmol/L (ref 22–32)
Calcium: 9.2 mg/dL (ref 8.9–10.3)
Chloride: 90 mmol/L — ABNORMAL LOW (ref 98–111)
Creatinine, Ser: 0.64 mg/dL (ref 0.61–1.24)
GFR calc Af Amer: >60 mL/min (ref >60)
GFR calc non Af Amer: >60 mL/min (ref >60)
Glucose, Bld: 113 mg/dL — ABNORMAL HIGH (ref 70–99)
Potassium: 3.3 mmol/L — ABNORMAL LOW (ref 3.5–5.1)
Sodium: 130 mmol/L — ABNORMAL LOW (ref 135–145)
Total Bilirubin: 0.6 mg/dL (ref 0.3–1.2)
Total Protein: 6.2 g/dL — ABNORMAL LOW (ref 6.5–8.1)

## 2018-08-26 MED ORDER — POTASSIUM CHLORIDE ER 10 MEQ PO TBCR: 10.0000 meq | EXTENDED_RELEASE_TABLET | Freq: Every day | ORAL | 0 refills | Status: DC

## 2018-08-26 MED ORDER — ALBUTEROL SULFATE HFA 108 (90 BASE) MCG/ACT IN AERS
2.0000 | INHALATION_SPRAY | RESPIRATORY_TRACT | Status: AC
  Administered 2018-08-26: 12:00:00 2 via RESPIRATORY_TRACT
  Filled 2018-08-26: qty 6.7

## 2018-08-26 MED ORDER — PREDNISONE 20 MG PO TABS: 40.0000 mg | ORAL_TABLET | Freq: Every day | ORAL | 0 refills | Status: DC

## 2018-08-26 MED ORDER — METHYLPREDNISOLONE SODIUM SUCC 125 MG IJ SOLR
125.0000 mg | Freq: Once | INTRAMUSCULAR | Status: AC
  Administered 2018-08-26: 125 mg via INTRAVENOUS
  Filled 2018-08-26: qty 2

## 2018-08-26 NOTE — ED Notes (Signed)
Pt ambulated well. No SOB. O2 sats stayed in between 97 and 100%.

## 2018-08-26 NOTE — ED Triage Notes (Signed)
Pt c/o worsening SOB x 1 week. Pt reports increased WOB when he gets up and moves around at home. Pt d/c from [facility] 2 weeks ago for COPD exacerbation. Pt on chronic 2L. Denies cough or fever.

## 2018-08-26 NOTE — Discharge Instructions (Signed)
Please take prednisone, 40 mg a day for the next 5 days and then go back to your usual dose of 5 mg/day.  Please use your albuterol treatments, 2 puffs on the inhaler with the AeroChamber every 6 hours as needed for shortness of breath and wheezing for the next 24 hours then as needed.  Your x-ray showed no signs of pneumonia, your blood work did show that your salt and potassium levels were slightly low.  Please put some extra salt in your food this evening, you should have these levels checked at your doctor's office within the week.  Occasionally low salt or potassium can make you feel weak so if you develop increasing weakness, muscle cramping or any other severe or worsening symptoms please return to the emergency department immediately.  This includes increasing shortness of breath coughing chest pain or fevers.

## 2018-08-26 NOTE — ED Provider Notes (Signed)
[facility]ERGENCY DEPARTMENT Provider Note   CSN: [id number] Arrival date & time: 08/26/18  1008    History   Chief Complaint Chief Complaint  Patient presents with   Shortness of Breath    HPI Jeffery [name] is a 68 y.o. male.     HPI  The patient is a 68 year old male, he has a known history of COPD as well as acid reflux, high blood pressure and is a prediabetic.  He is on 2 L by nasal cannula which she has been told to take at night and when he needs it during the day.  He reports that he last had to be admitted to the hospital several weeks ago when he had a COPD exacerbation, he [facility], he denies having been admitted here in a while and review of the medical record shows that he has had several visits for COPD exacerbations including one in March and 2 in February 2020, he reports being on chronic prednisone therapy currently taking 5 mg a day which he has been on for many many years.  He denies fevers, coughing, swelling of the legs and states that he is becoming more dyspneic as he walks across the house, gets better when he rests and better when he uses albuterol treatments through his nebulizer at home.  No fevers, no exposure to coronavirus or COVID patients and states that he lives with his wife who is otherwise been healthy.  Currently his symptoms are mild when he is resting and worse when he is up and walking around.  He states yesterday he was up and doing the dishes and feeling much better but today is a worse day.  He stopped smoking 8 years ago.  Denies any known history of cardiac disease.   Past Medical History:  Diagnosis Date   Chest pain 04/2011   Normal echocardiogram   COPD (chronic obstructive pulmonary disease) (HCC)    Fasting hyperglycemia    Normal hemoglobin A1c of 6.0   GERD (gastroesophageal reflux disease)    Hyperlipemia    Hypertension    On home O2    2L N/C   On home O2    at night    Prediabetes 06/04/2015   Tobacco abuse     Patient Active Problem List   Diagnosis Date Noted   Right middle lobe pneumonia (HCC) 11/07/2016   Acute on chronic respiratory failure with hypoxia (HCC) 11/07/2016   Prediabetes 06/04/2015   Hyperglycemia, drug-induced 06/03/2015   Essential hypertension 06/03/2015   COPD exacerbation (HCC) 06/02/2015   Hyperlipidemia 07/20/2011   Fasting hyperglycemia    Hypertension    Tobacco abuse    Chest pain 04/02/2011    Past Surgical History:  Procedure Laterality Date   APPENDECTOMY     APPENDECTOMY     LEFT HEART CATHETERIZATION WITH CORONARY ANGIOGRAM N/A 05/17/2011   Procedure: LEFT HEART CATHETERIZATION WITH CORONARY ANGIOGRAM;  Surgeon: Tonny BollmanMichael Cooper, MD;  [facility];  Service: Cardiovascular;  Laterality: N/A;        Home Medications    Prior to Admission medications   Medication Sig Start Date End Date Taking? Authorizing Provider  albuterol (PROVENTIL HFA;VENTOLIN HFA) 108 (90 Base) MCG/ACT inhaler Inhale 2 puffs into the lungs every 4 (four) hours as needed for wheezing or shortness of breath. 03/10/16  Yes Samuel JesterMcManus, Kathleen, DO  albuterol (PROVENTIL) (2.5 MG/3ML) 0.083% nebulizer solution Take 3 mLs (2.5 mg total) by nebulization every  4 (four) hours as needed for wheezing or shortness of breath. 06/07/18  Yes Samuel Jester, DO  ALPRAZolam Prudy Feeler) 0.5 MG tablet Take 0.5 mg by mouth at bedtime.   Yes [provider]  aspirin EC 81 MG tablet Take 81 mg by mouth daily.   Yes [provider]  atorvastatin (LIPITOR) 10 MG tablet Take 1 tablet by mouth daily. 08/01/18  Yes [provider]  benazepril (LOTENSIN) 5 MG tablet Take 1 tablet by mouth daily. 08/01/18  Yes [provider]  budesonide-formoterol (SYMBICORT) 160-4.5 MCG/ACT inhaler Inhale 2 puffs into the lungs 2 (two) times daily.   Yes [provider]  chlorthalidone (HYGROTON) 25 MG tablet Take 25 mg by  mouth daily.   Yes [provider]  diltiazem (CARDIZEM CD) 120 MG 24 hr capsule Take 1 capsule by mouth daily. 08/01/18  Yes [provider]  famotidine (PEPCID) 40 MG tablet Take 1 tablet by mouth daily as needed. 08/21/18  Yes [provider]  gabapentin (NEURONTIN) 300 MG capsule Take 300 mg by mouth 3 (three) times daily.  03/22/13  Yes [provider]  ipratropium-albuterol (DUONEB) 0.5-2.5 (3) MG/3ML SOLN Inhale 3 mLs into the lungs every 6 (six) hours as needed (for shortness of breath/wheezing).  07/17/14  Yes [provider]  isosorbide mononitrate (IMDUR) 30 MG 24 hr tablet Take 30 mg by mouth daily. 03/22/14  Yes [provider]  metFORMIN (GLUCOPHAGE) 500 MG tablet Take 500 mg by mouth daily.   Yes [provider]  metoprolol succinate (TOPROL-XL) 50 MG 24 hr tablet Take 50 mg by mouth every morning. Take with or immediately following a meal.   Yes [provider]  multivitamin (ONE-A-DAY MEN'S) TABS Take 1 tablet by mouth daily.     Yes [provider]  OXYGEN Inhale 2 L into the lungs daily as needed.    Yes [provider]  polyethylene glycol (MIRALAX / GLYCOLAX) packet Take 17 g by mouth daily as needed for mild constipation.   Yes [provider]  potassium chloride SA (K-DUR,KLOR-CON) 20 MEQ tablet Take 20 mEq by mouth daily.  05/12/14  Yes [provider]  predniSONE (DELTASONE) 5 MG tablet Take 5 mg by mouth daily with breakfast.   Yes [provider]  ranitidine (ZANTAC) 300 MG tablet Take 1 tablet by mouth 2 (two) times daily. 06/12/18  Yes [provider]  simvastatin (ZOCOR) 20 MG tablet Take 20 mg by mouth at bedtime.  03/22/14  Yes [provider]  sucralfate (CARAFATE) 1 g tablet Take 1 tablet by mouth 4 (four) times daily. 07/19/18  Yes [provider]  tiotropium (SPIRIVA) 18 MCG inhalation capsule Place 18 mcg into inhaler and inhale  daily.   Yes [provider]  potassium chloride (K-DUR) 10 MEQ tablet Take 1 tablet (10 mEq total) by mouth daily for 7 days. 08/26/18 09/02/18  Eber Hong, MD  predniSONE (DELTASONE) 20 MG tablet Take 2 tablets (40 mg total) by mouth daily. 08/26/18   Eber Hong, MD    Family History Family History  Problem Relation Age of Onset   Emphysema Father     Social History Social History   Tobacco Use   Smoking status: Former Smoker    Packs/day: 1.00    Years: 35.00    Pack years: 35.00    Types: Cigarettes    Last attempt to quit: 05/09/2013    Years since quitting: 5.3   Smokeless tobacco: Never  Used  Substance Use Topics   Alcohol use: No   Drug use: No     Allergies   Codeine   Review of Systems Review of Systems  All other systems reviewed and are negative.    Physical Exam Updated Vital Signs BP 111/78    Pulse 80    Temp 98.1 F (36.7 C) (Oral)    Resp 11    Ht 1.702 m ( )    Wt 54.4 kg    SpO2 100%    BMI 18.79 kg/m   Physical Exam Vitals signs and nursing note reviewed.  Constitutional:      General: He is not in acute distress.    Appearance: He is well-developed.  HENT:     Head: Normocephalic and atraumatic.     Mouth/Throat:     Pharynx: No oropharyngeal exudate.  Eyes:     General: No scleral icterus.       Right eye: No discharge.        Left eye: No discharge.     Conjunctiva/sclera: Conjunctivae normal.     Pupils: Pupils are equal, round, and reactive to light.  Neck:     Musculoskeletal: Normal range of motion and neck supple.     Thyroid: No thyromegaly.     Vascular: No JVD.  Cardiovascular:     Rate and Rhythm: Normal rate and regular rhythm.     Heart sounds: Normal heart sounds. No murmur. No friction rub. No gallop.   Pulmonary:     Effort: No respiratory distress.     Breath sounds: No stridor. Wheezing present. No rhonchi or rales.     Comments: The patient has decreased air movement in all lung fields, he  has a slightly prolonged expiratory phase, he has diffuse expiratory wheezing, there is no rales, oxygen is 100% on baseline 2 L Chest:     Chest wall: No tenderness.  Abdominal:     General: Bowel sounds are normal. There is no distension.     Palpations: Abdomen is soft. There is no mass.     Tenderness: There is no abdominal tenderness.  Musculoskeletal: Normal range of motion.        General: No tenderness.  Lymphadenopathy:     Cervical: No cervical adenopathy.  Skin:    General: Skin is warm and dry.     Findings: No erythema or rash.  Neurological:     Mental Status: He is alert.     Coordination: Coordination normal.  Psychiatric:        Behavior: Behavior normal.      ED Treatments / Results  Labs (all labs ordered are listed, but only abnormal results are displayed) Labs Reviewed  CBC WITH DIFFERENTIAL/PLATELET - Abnormal; Notable for the following components:      Result Value   RBC 3.90 (*)    Hemoglobin 11.3 (*)    HCT 35.5 (*)    Monocytes Absolute 1.1 (*)    Abs Immature Granulocytes 0.09 (*)    All other components within normal limits  COMPREHENSIVE METABOLIC PANEL - Abnormal; Notable for the following components:   Sodium 130 (*)    Potassium 3.3 (*)    Chloride 90 (*)    Glucose, Bld 113 (*)    Total Protein 6.2 (*)    AST 14 (*)    All other components within normal limits  TROPONIN I    EKG EKG Interpretation  Date/Time:  Sunday August 26 2018 10:56:39 EDT  Ventricular Rate:  80 PR Interval:    QRS Duration: 98 QT Interval:  364 QTC Calculation: 420 R Axis:   84 Text Interpretation:  Sinus rhythm Borderline right axis deviation since last tracing no significant change Confirmed by Eber Hong ([id number]) on 08/26/2018 10:59:09 AM   Radiology Dg Chest Port 1 View  Result Date: 08/26/2018 CLINICAL DATA:  Shortness of breath today.  History of COPD. EXAM: PORTABLE CHEST 1 VIEW COMPARISON:  Radiographs 08/07/2018 and 07/22/2018. Abdominal CT  11/22/2013. FINDINGS: 1115 hours. Lordotic positioning. The heart size and mediastinal contours are stable. Right middle lobe scarring is similar to multiple recent prior studies. There is no new airspace disease, pleural effusion or pneumothorax. The lungs remain mildly hyperinflated. Telemetry leads overlie the chest. IMPRESSION: Stable chest with chronic right middle lobe scarring. No acute findings. Electronically Signed   By: Carey Bullocks M.D.   On: 08/26/2018 12:52    Procedures Procedures (including critical care time)  Medications Ordered in ED Medications  methylPREDNISolone sodium succinate (SOLU-MEDROL) 125 mg/2 mL injection 125 mg (125 mg Intravenous Given 08/26/18 1112)  albuterol (VENTOLIN HFA) 108 (90 Base) MCG/ACT inhaler 2 puff (2 puffs Inhalation Given 08/26/18 1137)     Initial Impression / Assessment and Plan / ED Course  I have reviewed the triage vital signs and the nursing notes.  Pertinent labs & imaging results that were available during my care of the patient were reviewed by me and considered in my medical decision making (see chart for details).  Clinical Course as of Aug 26 1314  Sun Aug 26, 2018  1058 The patient ambulated around the nursing station 3 times, his oxygen dropped no lower than 97%.  He did not appear to be in distress and tolerated this very well.  This was prior to receiving albuterol treatment   [BM]  1143 I have personally viewed and interpreted the chest x-ray.  There does not appear to be any pneumothorax or obvious new infiltrate.  There is what appears to be scarring at the right base but comparing this to prior x-rays going back over several months there is no change.   [BM]    Clinical Course User Index [BM] Eber Hong, MD      The patient has no edema, he has no signs of congestive heart failure, there is no JVD.  His lungs sound very restricted consistent with his history of COPD and exacerbation.  The patient is not a diabetic  and thus should be able to tolerate a larger dose of steroids.  He will need an x-ray to rule out pneumonia given his predisposition to infection given his relative immunocompromise.  He is not hypotensive febrile or tachycardic.  He will need albuterol treatments and we will ambulate him prior to starting these treatments to see what his oxygen is on ambulation.  I reexamined the patient after his breathing treatment, he has received Solu-Medrol, he is 100% on his baseline 2 L, he has breathing comfortably, speaking in full sentences and has better air movement.  He has not had any hypoxia, tachycardia or signs of instability and has been made aware that he has a slightly low sodium and potassium.  He is stable for discharge to follow-up in the outpatient setting, the patient expressed his understanding to the indications for return  Jeffery [name] was evaluated in Emergency Department on 08/26/2018 for the symptoms described in the history of present illness. He was evaluated in the context of the global  COVID-19 pandemic, which necessitated consideration that the patient might be at risk for infection with the SARS-CoV-2 virus that causes COVID-19. Institutional protocols and algorithms that pertain to the evaluation of patients at risk for COVID-19 are in a state of rapid change based on information released by regulatory bodies including the CDC and federal and state organizations. These policies and algorithms were followed during the patient's care in the ED.   Final Clinical Impressions(s) / ED Diagnoses   Final diagnoses:  COPD exacerbation [facility])    ED Discharge Orders         Ordered    predniSONE (DELTASONE) 20 MG tablet  Daily     08/26/18 1315    potassium chloride (K-DUR) 10 MEQ tablet  Daily     08/26/18 1316           Eber Hong, MD 08/26/18 1316

## 2018-09-01 DIAGNOSIS — J449 Chronic obstructive pulmonary disease, unspecified: Secondary | ICD-10-CM | POA: Diagnosis not present

## 2018-09-01 DIAGNOSIS — J9611 Chronic respiratory failure with hypoxia: Secondary | ICD-10-CM | POA: Diagnosis not present

## 2018-09-03 DIAGNOSIS — K5900 Constipation, unspecified: Secondary | ICD-10-CM | POA: Diagnosis not present

## 2018-09-05 ENCOUNTER — Other Ambulatory Visit: Payer: Self-pay | Admitting: Pharmacy Technician

## 2018-09-05 NOTE — Patient Outreach (Signed)
[facility]) Care Management  09/05/2018  Jeffery [name] [birth year] [phone]    Follow up call placed to [name] regarding patient assistance application(s) for [facility] and Proventil HFA , [name] states patient has been approved as of 4/27 until 05/02/19. Medication to arrive at patient home in 10-14 business days from approval date.  Follow up:  Will follow up with patient in 7-10 business days to confirm medications have been received.  Suzan Slick Effie Shy CPhT Certified Pharmacy Technician [facility] Care Management Direct Dial:[phone]

## 2018-09-17 ENCOUNTER — Other Ambulatory Visit: Payer: Self-pay

## 2018-09-17 ENCOUNTER — Emergency Department (HOSPITAL_COMMUNITY): Payer: Medicare Other

## 2018-09-17 ENCOUNTER — Emergency Department (HOSPITAL_COMMUNITY)
Admission: EM | Admit: 2018-09-17 | Discharge: 2018-09-18 | Disposition: A | Discharge status: Home / Self Care | Payer: Medicare Other | Attending: Emergency Medicine | Admitting: Emergency Medicine

## 2018-09-17 ENCOUNTER — Encounter (HOSPITAL_COMMUNITY): Payer: Self-pay | Admitting: Emergency Medicine

## 2018-09-17 DIAGNOSIS — Z79899 Other long term (current) drug therapy: Secondary | ICD-10-CM | POA: Insufficient documentation

## 2018-09-17 DIAGNOSIS — Z20828 Contact with and (suspected) exposure to other viral communicable diseases: Secondary | ICD-10-CM | POA: Diagnosis not present

## 2018-09-17 DIAGNOSIS — J441 Chronic obstructive pulmonary disease with (acute) exacerbation: Secondary | ICD-10-CM

## 2018-09-17 DIAGNOSIS — I10 Essential (primary) hypertension: Secondary | ICD-10-CM | POA: Diagnosis not present

## 2018-09-17 DIAGNOSIS — E785 Hyperlipidemia, unspecified: Secondary | ICD-10-CM | POA: Insufficient documentation

## 2018-09-17 DIAGNOSIS — R0789 Other chest pain: Secondary | ICD-10-CM | POA: Diagnosis not present

## 2018-09-17 DIAGNOSIS — R0602 Shortness of breath: Secondary | ICD-10-CM | POA: Diagnosis not present

## 2018-09-17 DIAGNOSIS — F17210 Nicotine dependence, cigarettes, uncomplicated: Secondary | ICD-10-CM | POA: Insufficient documentation

## 2018-09-17 LAB — CBC WITH DIFFERENTIAL/PLATELET
Abs Immature Granulocytes: 0.11 10*3/uL — ABNORMAL HIGH (ref 0.00–0.07)
Basophils Absolute: 0.1 10*3/uL (ref 0.0–0.1)
Basophils Relative: 1 %
Eosinophils Absolute: 0.9 10*3/uL — ABNORMAL HIGH (ref 0.0–0.5)
Eosinophils Relative: 8 %
HCT: 37.9 % — ABNORMAL LOW (ref 39.0–52.0)
Hemoglobin: 12.3 g/dL — ABNORMAL LOW (ref 13.0–17.0)
Immature Granulocytes: 1 %
Lymphocytes Relative: 8 %
Lymphs Abs: 0.9 10*3/uL (ref 0.7–4.0)
MCH: 29.2 pg (ref 26.0–34.0)
MCHC: 32.5 g/dL (ref 30.0–36.0)
MCV: 90 fL (ref 80.0–100.0)
Monocytes Absolute: 0.8 10*3/uL (ref 0.1–1.0)
Monocytes Relative: 7 %
Neutro Abs: 8.1 10*3/uL — ABNORMAL HIGH (ref 1.7–7.7)
Neutrophils Relative %: 75 %
Platelets: 281 10*3/uL (ref 150–400)
RBC: 4.21 MIL/uL — ABNORMAL LOW (ref 4.22–5.81)
RDW: 14.7 % (ref 11.5–15.5)
WBC: 10.8 10*3/uL — ABNORMAL HIGH (ref 4.0–10.5)
nRBC: 0 % (ref 0.0–0.2)

## 2018-09-17 LAB — BASIC METABOLIC PANEL
Anion gap: 14 (ref 5–15)
BUN: 9 mg/dL (ref 8–23)
CO2: 29 mmol/L (ref 22–32)
Calcium: 9.4 mg/dL (ref 8.9–10.3)
Chloride: 87 mmol/L — ABNORMAL LOW (ref 98–111)
Creatinine, Ser: 0.62 mg/dL (ref 0.61–1.24)
GFR calc Af Amer: >60 mL/min (ref >60)
GFR calc non Af Amer: >60 mL/min (ref >60)
Glucose, Bld: 121 mg/dL — ABNORMAL HIGH (ref 70–99)
Potassium: 3.6 mmol/L (ref 3.5–5.1)
Sodium: 130 mmol/L — ABNORMAL LOW (ref 135–145)

## 2018-09-17 LAB — SARS CORONAVIRUS 2 BY RT PCR (HOSPITAL ORDER, PERFORMED IN ~~LOC~~ HOSPITAL LAB): SARS Coronavirus 2: NEGATIVE

## 2018-09-17 LAB — TROPONIN I: Troponin I: <0.03 ng/mL (ref <0.03)

## 2018-09-17 MED ORDER — ALBUTEROL SULFATE HFA 108 (90 BASE) MCG/ACT IN AERS
8.0000 | INHALATION_SPRAY | Freq: Once | RESPIRATORY_TRACT | Status: AC
  Administered 2018-09-17: 8 via RESPIRATORY_TRACT
  Filled 2018-09-17: qty 6.7

## 2018-09-17 MED ORDER — IPRATROPIUM BROMIDE HFA 17 MCG/ACT IN AERS
2.0000 | INHALATION_SPRAY | Freq: Once | RESPIRATORY_TRACT | Status: AC
  Administered 2018-09-17: 2 via RESPIRATORY_TRACT
  Filled 2018-09-17: qty 12.9

## 2018-09-17 MED ORDER — ALBUTEROL SULFATE HFA 108 (90 BASE) MCG/ACT IN AERS
4.0000 | INHALATION_SPRAY | Freq: Once | RESPIRATORY_TRACT | Status: AC
  Administered 2018-09-18: 4 via RESPIRATORY_TRACT

## 2018-09-17 MED ORDER — AEROCHAMBER PLUS FLO-VU MISC
1.0000 | Freq: Once | Status: AC
  Administered 2018-09-17: 1
  Filled 2018-09-17: qty 1

## 2018-09-17 MED ORDER — PREDNISONE 20 MG PO TABS: 40.0000 mg | ORAL_TABLET | Freq: Every day | ORAL | 0 refills | Status: DC

## 2018-09-17 MED ORDER — IPRATROPIUM BROMIDE HFA 17 MCG/ACT IN AERS
2.0000 | INHALATION_SPRAY | Freq: Once | RESPIRATORY_TRACT | Status: AC
  Administered 2018-09-18: 2 via RESPIRATORY_TRACT
  Filled 2018-09-17: qty 12.9

## 2018-09-17 MED ORDER — METHYLPREDNISOLONE SODIUM SUCC 125 MG IJ SOLR
125.0000 mg | Freq: Once | INTRAMUSCULAR | Status: AC
  Administered 2018-09-17: 125 mg via INTRAVENOUS
  Filled 2018-09-17: qty 2

## 2018-09-17 NOTE — ED Provider Notes (Signed)
[facility] EMERGENCY DEPARTMENT Provider Note   CSN: [id number] Arrival date & time: 09/17/18  1722    History   Chief Complaint Chief Complaint  Patient presents with   Shortness of Breath    HPI Jeffery [name] is a 68 y.o. male.     Jeffery [name] is a 68 y.o. male with a history of COPD on home O2, hypertension, hyperlipidemia, GERD, prediabetes and tobacco abuse, who presents to the emergency department for evaluation of shortness of breath.  Patient reports that over the past 2 days he has had worsening shortness of breath and has felt that his chest has been tight and he has been wheezing more than usual.  He denies associated chest pain.  No lower extremity swelling or edema.  He has an occasional chronic cough which is unchanged he is not had any sputum production.  He denies any fevers.  He has been staying home primarily and denies any sick contacts or contacts with anyone with coronavirus.  No abdominal pain, nausea vomiting or diarrhea.  He has been using his home medications without improvement, is on chronic 5 mg of prednisone daily.  No other aggravating or alleviating factors.     Past Medical History:  Diagnosis Date   Chest pain 04/2011   Normal echocardiogram   COPD (chronic obstructive pulmonary disease) (HCC)    Fasting hyperglycemia    Normal hemoglobin A1c of 6.0   GERD (gastroesophageal reflux disease)    Hyperlipemia    Hypertension    On home O2    2L N/C   On home O2    at night   Prediabetes 06/04/2015   Tobacco abuse     Patient Active Problem List   Diagnosis Date Noted   Right middle lobe pneumonia (HCC) 11/07/2016   Acute on chronic respiratory failure with hypoxia (HCC) 11/07/2016   Prediabetes 06/04/2015   Hyperglycemia, drug-induced 06/03/2015   Essential hypertension 06/03/2015   COPD exacerbation (HCC) 06/02/2015   Hyperlipidemia 07/20/2011   Fasting hyperglycemia    Hypertension    Tobacco abuse     Chest pain 04/02/2011    Past Surgical History:  Procedure Laterality Date   APPENDECTOMY     APPENDECTOMY     LEFT HEART CATHETERIZATION WITH CORONARY ANGIOGRAM N/A 05/17/2011   Procedure: LEFT HEART CATHETERIZATION WITH CORONARY ANGIOGRAM;  Surgeon: Tonny Bollman, MD;  Location: [facility] CATH LAB;  Service: Cardiovascular;  Laterality: N/A;        Home Medications    Prior to Admission medications   Medication Sig Start Date End Date Taking? Authorizing Provider  albuterol (PROVENTIL HFA;VENTOLIN HFA) 108 (90 Base) MCG/ACT inhaler Inhale 2 puffs into the lungs every 4 (four) hours as needed for wheezing or shortness of breath. 03/10/16   Samuel Jester, DO  albuterol (PROVENTIL) (2.5 MG/3ML) 0.083% nebulizer solution Take 3 mLs (2.5 mg total) by nebulization every 4 (four) hours as needed for wheezing or shortness of breath. 06/07/18   Samuel Jester, DO  ALPRAZolam Prudy Feeler) 0.5 MG tablet Take 0.5 mg by mouth at bedtime.    [provider]  aspirin EC 81 MG tablet Take 81 mg by mouth daily.    [provider]  atorvastatin (LIPITOR) 10 MG tablet Take 1 tablet by mouth daily. 08/01/18   [provider]  benazepril (LOTENSIN) 5 MG tablet Take 1 tablet by mouth daily. 08/01/18   [provider]  budesonide-formoterol (SYMBICORT) 160-4.5 MCG/ACT inhaler Inhale 2 puffs into the lungs  2 (two) times daily.    [provider]  chlorthalidone (HYGROTON) 25 MG tablet Take 25 mg by mouth daily.    [provider]  diltiazem (CARDIZEM CD) 120 MG 24 hr capsule Take 1 capsule by mouth daily. 08/01/18   [provider]  famotidine (PEPCID) 40 MG tablet Take 1 tablet by mouth daily as needed. 08/21/18   [provider]  gabapentin (NEURONTIN) 300 MG capsule Take 300 mg by mouth 3 (three) times daily.  03/22/13   [provider]  ipratropium-albuterol (DUONEB) 0.5-2.5 (3) MG/3ML SOLN Inhale 3 mLs into the lungs every 6 (six) hours  as needed (for shortness of breath/wheezing).  07/17/14   [provider]  isosorbide mononitrate (IMDUR) 30 MG 24 hr tablet Take 30 mg by mouth daily. 03/22/14   [provider]  metFORMIN (GLUCOPHAGE) 500 MG tablet Take 500 mg by mouth daily.    [provider]  metoprolol succinate (TOPROL-XL) 50 MG 24 hr tablet Take 50 mg by mouth every morning. Take with or immediately following a meal.    [provider]  multivitamin (ONE-A-DAY MEN'S) TABS Take 1 tablet by mouth daily.      [provider]  OXYGEN Inhale 2 L into the lungs daily as needed.     [provider]  polyethylene glycol (MIRALAX / GLYCOLAX) packet Take 17 g by mouth daily as needed for mild constipation.    [provider]  potassium chloride (K-DUR) 10 MEQ tablet Take 1 tablet (10 mEq total) by mouth daily for 7 days. 08/26/18 09/02/18  Eber Hong, MD  potassium chloride SA (K-DUR,KLOR-CON) 20 MEQ tablet Take 20 mEq by mouth daily.  05/12/14   [provider]  predniSONE (DELTASONE) 20 MG tablet Take 2 tablets (40 mg total) by mouth daily. 09/17/18   Dartha Lodge, PA-C  predniSONE (DELTASONE) 5 MG tablet Take 5 mg by mouth daily with breakfast.    [provider]  ranitidine (ZANTAC) 300 MG tablet Take 1 tablet by mouth 2 (two) times daily. 06/12/18   [provider]  simvastatin (ZOCOR) 20 MG tablet Take 20 mg by mouth at bedtime.  03/22/14   [provider]  sucralfate (CARAFATE) 1 g tablet Take 1 tablet by mouth 4 (four) times daily. 07/19/18   [provider]  tiotropium (SPIRIVA) 18 MCG inhalation capsule Place 18 mcg into inhaler and inhale daily.    [provider]    Family History Family History  Problem Relation Age of Onset   Emphysema Father     Social History Social History   Tobacco Use   Smoking status: Former Smoker    Packs/day: 1.00    Years: 35.00    Pack years: 35.00    Types:  Cigarettes    Last attempt to quit: 05/09/2013    Years since quitting: 5.3   Smokeless tobacco: Never Used  Substance Use Topics   Alcohol use: No   Drug use: No     Allergies   Codeine   Review of Systems Review of Systems  Constitutional: Negative for chills and fever.  HENT: Negative.   Eyes: Negative for visual disturbance.  Respiratory: Positive for chest tightness, shortness of breath and wheezing. Negative for cough.   Cardiovascular: Negative for chest pain and leg swelling.  Gastrointestinal: Negative for abdominal pain, diarrhea, nausea and vomiting.  Genitourinary: Negative for dysuria.  Musculoskeletal: Negative for arthralgias and myalgias.  Skin: Negative for color change and  rash.  Neurological: Negative for dizziness, syncope and light-headedness.     Physical Exam Updated Vital Signs BP 117/84 (BP Location: Right Arm)    Pulse 86    Temp 98.8 F (37.1 C) (Oral)    Resp 18    Ht  (1.702 m)    Wt 53.1 kg    SpO2 98%    BMI 18.32 kg/m   Physical Exam Vitals signs and nursing note reviewed.  Constitutional:      General: He is not in acute distress.    Appearance: He is well-developed and normal weight. He is not ill-appearing or diaphoretic.  HENT:     Head: Normocephalic and atraumatic.  Eyes:     General:        Right eye: No discharge.        Left eye: No discharge.  Neck:     Musculoskeletal: Neck supple.  Cardiovascular:     Rate and Rhythm: Normal rate and regular rhythm.     Pulses: Normal pulses.     Heart sounds: Normal heart sounds. No murmur. No friction rub.  Pulmonary:     Effort: Pulmonary effort is normal. No respiratory distress.     Breath sounds: Wheezing present.     Comments: Patient on 2 L O2 chronically, satting well.  Slightly increased work of breathing and on auscultation patient has some scattered wheezing throughout but primarily sounds tight with decreased air movement. Chest:     Chest wall: No tenderness.    Abdominal:     General: Bowel sounds are normal.     Palpations: Abdomen is soft. There is no mass.     Tenderness: There is no abdominal tenderness. There is no guarding.     Comments: Abdomen soft, nondistended, nontender to palpation in all quadrants without guarding or peritoneal signs  Musculoskeletal:     Right lower leg: He exhibits no tenderness. No edema.     Left lower leg: He exhibits no tenderness. No edema.  Skin:    General: Skin is warm and dry.     Capillary Refill: Capillary refill takes less than 2 seconds.  Neurological:     Mental Status: He is alert and oriented to person, place, and time.     Coordination: Coordination normal.  Psychiatric:        Mood and Affect: Mood normal.        Behavior: Behavior normal.      ED Treatments / Results  Labs (all labs ordered are listed, but only abnormal results are displayed) Labs Reviewed  BASIC METABOLIC PANEL - Abnormal; Notable for the following components:      Result Value   Sodium 130 (*)    Chloride 87 (*)    Glucose, Bld 121 (*)    All other components within normal limits  CBC WITH DIFFERENTIAL/PLATELET - Abnormal; Notable for the following components:   WBC 10.8 (*)    RBC 4.21 (*)    Hemoglobin 12.3 (*)    HCT 37.9 (*)    Neutro Abs 8.1 (*)    Eosinophils Absolute 0.9 (*)    Abs Immature Granulocytes 0.11 (*)    All other components within normal limits  SARS CORONAVIRUS 2 (HOSPITAL ORDER, PERFORMED IN [facility] LAB)  TROPONIN I    EKG None  Radiology Dg Chest Port 1 View  Result Date: 09/17/2018 CLINICAL DATA:  Shortness of breath. EXAM: PORTABLE CHEST 1 VIEW COMPARISON:  Chest x-ray dated 04/26 2020  FINDINGS: The lungs are somewhat hyperexpanded. There is no pneumothorax. No large pleural effusion. A calcified granuloma is again noted in the left mid lung zone. There is scarring versus atelectasis in the right middle lobe. IMPRESSION: 1. No acute cardiopulmonary process. 2.  Somewhat hyperexpanded lungs which can be seen in patients with COPD. Electronically Signed   By: Katherine Mantlehristopher  Green M.D.   On: 09/17/2018 21:21    Procedures Procedures (including critical care time)  Medications Ordered in ED Medications  albuterol (VENTOLIN HFA) 108 (90 Base) MCG/ACT inhaler 4 puff (has no administration in time range)  ipratropium (ATROVENT HFA) inhaler 2 puff (has no administration in time range)  albuterol (VENTOLIN HFA) 108 (90 Base) MCG/ACT inhaler 8 puff (8 puffs Inhalation Given 09/17/18 2051)  aerochamber plus with mask device 1 each (1 each Other Given 09/17/18 2051)  ipratropium (ATROVENT HFA) inhaler 2 puff (2 puffs Inhalation Given 09/17/18 2107)  methylPREDNISolone sodium succinate (SOLU-MEDROL) 125 mg/2 mL injection 125 mg (125 mg Intravenous Given 09/17/18 2031)     Initial Impression / Assessment and Plan / ED Course  I have reviewed the triage vital signs and the nursing notes.  Pertinent labs & imaging results that were available during my care of the patient were reviewed by me and considered in my medical decision making (see chart for details).  68 year old male with long history of COPD on 2 L of O2 chronically at home, presents with 2 days of worsening shortness of breath.  He reports that when he gets up to walk even short distances he feels that he has to work harder to breathe and he has noticed more wheezing than usual.  Denies cough or fever.  No associated chest pain.  No lower extremity edema or pain.  On arrival he is satting well on his chronic home oxygen but does have some slightly increased work of breathing and on exam lungs with some wheezing throughout but primarily he sounds tight.  Exam is otherwise unremarkable.  Will check basic labs, troponin, EKG and chest x-ray.  Will give albuterol and Atrovent inhalers with spacer as well as dose of prednisone.  Patient is on chronic low-dose prednisone daily but feel he would benefit from  prednisone burst in the setting of likely COPD exacerbation.  I have low suspicion for ACS, patient is not experiencing any chest pain.  He has no prior history of heart failure and does not appear fluid overloaded on exam.  Labs overall very reassuring, white count of 10.8, stable hemoglobin, no acute electrolyte derangements, patient with chronic hyponatremia of 130 which is unchanged, glucose of 121, normal renal function, negative troponin.  COVID test was negative as well.  Presentation consistent with COPD exacerbation.  On reevaluation patient reports he is feeling much better.  Lungs are much clearer and he is moving better air, 1 additional round of albuterol and Atrovent given and then patient was ambulated in the department, he was able to walk around the nurses station for 3 laps and maintained O2 saturations greater than 98% with no increased work of breathing on his home 2 L of O2.  At this time feel patient is stable for discharge home, he has not had significantly worsened cough or sputum production Soo do not feel that he needs antibiotics but he will be given a course of steroids in addition to his home inhalers.  PCP follow-up encouraged.  Return precautions discussed.  Patient expresses understanding and agreement with plan.  Discharged home in  good condition.  Final Clinical Impressions(s) / ED Diagnoses   Final diagnoses:  COPD exacerbation [facility])    ED Discharge Orders         Ordered    predniSONE (DELTASONE) 20 MG tablet  Daily     09/17/18 2321           Dartha Lodge, [place] 09/18/18 0001    Loren Racer, MD 09/18/18 [phone]

## 2018-09-17 NOTE — ED Triage Notes (Signed)
Pt c/o continued SOB, sometimes with exertion. Pt seen 4/26 in ED, stated SOB improved, but worsened over the past few days. Audible wheezes. Denies worsening cough or fever. Hx of COPD on chronic 2L. Pt currently on prednisone.

## 2018-09-17 NOTE — Discharge Instructions (Signed)
Take steroids for the next 5 days as directed.  Use your albuterol inhaler every 6 hours for the next 24 hours and then as needed.  Follow-up with your primary care doctor.  Turn to the emergency department if you have worsening shortness of breath or wheezing, fevers, cough or any other new or concerning symptoms.

## 2018-09-17 NOTE — ED Notes (Signed)
Ambulated around nurses desk twice. Maintained 100% on 2L

## 2018-09-18 NOTE — ED Notes (Signed)
Pt ambulatory to waiting room. Pt verbalized understanding of discharge instructions.   

## 2018-09-26 ENCOUNTER — Other Ambulatory Visit: Payer: Self-pay | Admitting: Pharmacy Technician

## 2018-09-26 ENCOUNTER — Other Ambulatory Visit: Payer: Self-pay | Admitting: Pharmacist

## 2018-09-26 DIAGNOSIS — K5900 Constipation, unspecified: Secondary | ICD-10-CM | POA: Diagnosis not present

## 2018-09-26 DIAGNOSIS — J440 Chronic obstructive pulmonary disease with acute lower respiratory infection: Secondary | ICD-10-CM | POA: Diagnosis not present

## 2018-09-26 NOTE — Patient Outreach (Signed)
[facility]) Care Management [facility]  09/26/2018  Jeffery [name] [birth year] [id number]  Reason for referral: medication assistance/management  Successful care coordination call to [name] and wife with HIPAA identifiers verified.  Patient states he is doing better than he was before going to the ED.  Patient denies SOB/wheezing.  He was sent home from ED with ProAir and Atrovent.  Patient is currently taking Pro Air and Atrovent.  Patient also received Spiriva, Dulera, and Proventil via patient assistance program in the mail last week.  Instructed patient to taking Atrovent, Dulera and use ProAir (albuterol) PRN rescue inhaler until Atrovent is finished.  Then he will transition to Spiriva, Elwin Sleight and PRN Ventolin (albuterol rescue).  Wife verbalizes understanding.  Instructed patient & wife how to order refills.  Successful care coordination call to Dr. Olena Leatherwood requesting pulmonary consult per patient request.  Requested that office call patient & wife regarding status of pulmonary request.  PLAN: -I will follow up with patient next month  Kieth Brightly, PharmD, [facility] Clinical Pharmacist [facility]  [phone]

## 2018-09-26 NOTE — Patient Outreach (Addendum)
[facility]) Care Management  09/26/2018  Jeffery [name] 08-25-1950 [id number]    Unsuccessful call #1 placed to patient regarding patient assistance medication receipt from [name], HIPAA compliant voicemail left. Called [name] to verify [facility] and Proventil HFA has been received from [employer] patient assistance.  Recent ED visit for COPD exacerbation on 09/17/18 will route note to [facility] Vanice Sarah to inform of visit.  Follow up:  Will make 2nd call attempt in 2-3 business days to verify [facility] and Proventil HFA have been received.  Suzan Slick Effie Shy CPhT Certified Pharmacy Technician [facility] Care Management Direct Dial:[phone]   In basket message from [facility] Vanice Sarah stating that she was able to outreach patient for consultation. She confirmed that patient received Dulera and Proventil HFA from [name] and was able to counsel on how to obtain refills.  Suzan Slick Effie Shy CPhT Certified Pharmacy Technician [facility] Care Management Direct Dial:[phone]

## 2018-09-30 ENCOUNTER — Encounter (HOSPITAL_COMMUNITY): Payer: Self-pay | Admitting: *Deleted

## 2018-09-30 ENCOUNTER — Other Ambulatory Visit: Payer: Self-pay

## 2018-09-30 ENCOUNTER — Emergency Department (HOSPITAL_COMMUNITY): Payer: Medicare Other

## 2018-09-30 ENCOUNTER — Emergency Department (HOSPITAL_COMMUNITY)
Admission: EM | Admit: 2018-09-30 | Discharge: 2018-10-01 | Disposition: A | Discharge status: Home / Self Care | Payer: Medicare Other | Attending: Emergency Medicine | Admitting: Emergency Medicine

## 2018-09-30 DIAGNOSIS — I10 Essential (primary) hypertension: Secondary | ICD-10-CM | POA: Diagnosis not present

## 2018-09-30 DIAGNOSIS — Z87891 Personal history of nicotine dependence: Secondary | ICD-10-CM | POA: Diagnosis not present

## 2018-09-30 DIAGNOSIS — R0602 Shortness of breath: Secondary | ICD-10-CM | POA: Diagnosis not present

## 2018-09-30 DIAGNOSIS — R7303 Prediabetes: Secondary | ICD-10-CM | POA: Diagnosis not present

## 2018-09-30 DIAGNOSIS — E876 Hypokalemia: Secondary | ICD-10-CM | POA: Diagnosis not present

## 2018-09-30 DIAGNOSIS — J441 Chronic obstructive pulmonary disease with (acute) exacerbation: Secondary | ICD-10-CM | POA: Insufficient documentation

## 2018-09-30 DIAGNOSIS — Z9981 Dependence on supplemental oxygen: Secondary | ICD-10-CM | POA: Insufficient documentation

## 2018-09-30 DIAGNOSIS — Z20828 Contact with and (suspected) exposure to other viral communicable diseases: Secondary | ICD-10-CM | POA: Insufficient documentation

## 2018-09-30 DIAGNOSIS — Z79899 Other long term (current) drug therapy: Secondary | ICD-10-CM | POA: Diagnosis not present

## 2018-09-30 DIAGNOSIS — Z7982 Long term (current) use of aspirin: Secondary | ICD-10-CM | POA: Insufficient documentation

## 2018-09-30 LAB — BASIC METABOLIC PANEL
Anion gap: 15 (ref 5–15)
BUN: 7 mg/dL — ABNORMAL LOW (ref 8–23)
CO2: 28 mmol/L (ref 22–32)
Calcium: 9.3 mg/dL (ref 8.9–10.3)
Chloride: 91 mmol/L — ABNORMAL LOW (ref 98–111)
Creatinine, Ser: 0.5 mg/dL — ABNORMAL LOW (ref 0.61–1.24)
GFR calc Af Amer: >60 mL/min (ref >60)
GFR calc non Af Amer: >60 mL/min (ref >60)
Glucose, Bld: 117 mg/dL — ABNORMAL HIGH (ref 70–99)
Potassium: 2.9 mmol/L — ABNORMAL LOW (ref 3.5–5.1)
Sodium: 134 mmol/L — ABNORMAL LOW (ref 135–145)

## 2018-09-30 LAB — TROPONIN I: Troponin I: <0.03 ng/mL (ref <0.03)

## 2018-09-30 LAB — CBC WITH DIFFERENTIAL/PLATELET
Abs Immature Granulocytes: 0.14 10*3/uL — ABNORMAL HIGH (ref 0.00–0.07)
Basophils Absolute: 0 10*3/uL (ref 0.0–0.1)
Basophils Relative: 0 %
Eosinophils Absolute: 0.5 10*3/uL (ref 0.0–0.5)
Eosinophils Relative: 6 %
HCT: 37.9 % — ABNORMAL LOW (ref 39.0–52.0)
Hemoglobin: 12.4 g/dL — ABNORMAL LOW (ref 13.0–17.0)
Immature Granulocytes: 2 %
Lymphocytes Relative: 13 %
Lymphs Abs: 1.2 10*3/uL (ref 0.7–4.0)
MCH: 29.4 pg (ref 26.0–34.0)
MCHC: 32.7 g/dL (ref 30.0–36.0)
MCV: 89.8 fL (ref 80.0–100.0)
Monocytes Absolute: 1.1 10*3/uL — ABNORMAL HIGH (ref 0.1–1.0)
Monocytes Relative: 12 %
Neutro Abs: 6.4 10*3/uL (ref 1.7–7.7)
Neutrophils Relative %: 67 %
Platelets: 271 10*3/uL (ref 150–400)
RBC: 4.22 MIL/uL (ref 4.22–5.81)
RDW: 14.8 % (ref 11.5–15.5)
WBC: 9.5 10*3/uL (ref 4.0–10.5)
nRBC: 0 % (ref 0.0–0.2)

## 2018-09-30 LAB — BRAIN NATRIURETIC PEPTIDE: B Natriuretic Peptide: 13 pg/mL (ref 0.0–100.0)

## 2018-09-30 LAB — SARS CORONAVIRUS 2 BY RT PCR (HOSPITAL ORDER, PERFORMED IN ~~LOC~~ HOSPITAL LAB): SARS Coronavirus 2: NEGATIVE

## 2018-09-30 MED ORDER — IPRATROPIUM BROMIDE HFA 17 MCG/ACT IN AERS
2.0000 | INHALATION_SPRAY | Freq: Once | RESPIRATORY_TRACT | Status: DC
  Filled 2018-09-30: qty 12.9

## 2018-09-30 MED ORDER — POTASSIUM CHLORIDE 20 MEQ PO PACK
40.0000 meq | PACK | Freq: Once | ORAL | Status: AC
  Administered 2018-09-30: 23:00:00 40 meq via ORAL
  Filled 2018-09-30: qty 2

## 2018-09-30 MED ORDER — METHYLPREDNISOLONE SODIUM SUCC 125 MG IJ SOLR
125.0000 mg | Freq: Once | INTRAMUSCULAR | Status: DC
  Filled 2018-09-30: qty 2

## 2018-09-30 MED ORDER — METHYLPREDNISOLONE SODIUM SUCC 125 MG IJ SOLR
125.0000 mg | Freq: Once | INTRAMUSCULAR | Status: AC
  Administered 2018-09-30: 22:00:00 125 mg via INTRAVENOUS

## 2018-09-30 MED ORDER — PREDNISONE 10 MG PO TABS: 40.0000 mg | ORAL_TABLET | Freq: Every day | ORAL | 0 refills | Status: AC

## 2018-09-30 MED ORDER — ALBUTEROL SULFATE HFA 108 (90 BASE) MCG/ACT IN AERS
6.0000 | INHALATION_SPRAY | Freq: Once | RESPIRATORY_TRACT | Status: AC
  Administered 2018-09-30: 21:00:00 6 via RESPIRATORY_TRACT
  Filled 2018-09-30: qty 6.7

## 2018-09-30 MED ORDER — IPRATROPIUM-ALBUTEROL 0.5-2.5 (3) MG/3ML IN SOLN
3.0000 mL | RESPIRATORY_TRACT | Status: AC
  Administered 2018-09-30 (×3): 3 mL via RESPIRATORY_TRACT
  Filled 2018-09-30 (×3): qty 3

## 2018-09-30 MED ORDER — POTASSIUM CHLORIDE ER 10 MEQ PO TBCR: 10.0000 meq | EXTENDED_RELEASE_TABLET | Freq: Every day | ORAL | 0 refills | Status: DC

## 2018-09-30 NOTE — ED Notes (Signed)
Walked pt with 02 on finger and 2 liter of oxygen going.02 started at 97% ,while walking 02 dropped down to 93% but was back up to 97% when pt returned to his room.

## 2018-09-30 NOTE — ED Notes (Signed)
RT called for neb tx.

## 2018-09-30 NOTE — ED Provider Notes (Signed)
[facility] EMERGENCY DEPARTMENT Provider Note   CSN: [id number] Arrival date & time: 09/30/18  2023    History   Chief Complaint Chief Complaint  Patient presents with  . Shortness of Breath    COPD    HPI Jeffery [name] is a 68 y.o. male.     Patient is a 68 year old gentleman with past medical history of COPD, GERD, hypertension, hyperlipidemia who is on continuous 2 L of home oxygen presents the emergency department for shortness of breath.  Patient reports that he has been short of breath with exertion for the last couple of days.  He was seen on the 18th of this month for COPD exacerbation.  Reports that he did in fact improve with inhalers and prednisone treatment.  However reports that now he feels worse again.  Denies any chest pain, cough, fever, sputum production, nausea, vomiting, leg swelling.  Reports that he is taking all of his medication as prescribed.  He no longer has any prednisone at home to take.     Past Medical History:  Diagnosis Date  . Chest pain 04/2011   Normal echocardiogram  . COPD (chronic obstructive pulmonary disease) (HCC)   . Fasting hyperglycemia    Normal hemoglobin A1c of 6.0  . GERD (gastroesophageal reflux disease)   . Hyperlipemia   . Hypertension   . On home O2    2L N/C  . On home O2    at night  . Prediabetes 06/04/2015  . Tobacco abuse     Patient Active Problem List   Diagnosis Date Noted  . Right middle lobe pneumonia (HCC) 11/07/2016  . Acute on chronic respiratory failure with hypoxia (HCC) 11/07/2016  . Prediabetes 06/04/2015  . Hyperglycemia, drug-induced 06/03/2015  . Essential hypertension 06/03/2015  . COPD exacerbation (HCC) 06/02/2015  . Hyperlipidemia 07/20/2011  . Fasting hyperglycemia   . Hypertension   . Tobacco abuse   . Chest pain 04/02/2011    Past Surgical History:  Procedure Laterality Date  . APPENDECTOMY    . APPENDECTOMY    . LEFT HEART CATHETERIZATION WITH CORONARY ANGIOGRAM N/A  05/17/2011   Procedure: LEFT HEART CATHETERIZATION WITH CORONARY ANGIOGRAM;  Surgeon: Tonny Bollman, MD;  Location: [facility] CATH LAB;  Service: Cardiovascular;  Laterality: N/A;        Home Medications    Prior to Admission medications   Medication Sig Start Date End Date Taking? Authorizing Provider  albuterol (PROVENTIL HFA;VENTOLIN HFA) 108 (90 Base) MCG/ACT inhaler Inhale 2 puffs into the lungs every 4 (four) hours as needed for wheezing or shortness of breath. 03/10/16   Samuel Jester, DO  albuterol (PROVENTIL) (2.5 MG/3ML) 0.083% nebulizer solution Take 3 mLs (2.5 mg total) by nebulization every 4 (four) hours as needed for wheezing or shortness of breath. 06/07/18   Samuel Jester, DO  ALPRAZolam Prudy Feeler) 0.5 MG tablet Take 0.5 mg by mouth at bedtime.    [provider]  aspirin EC 81 MG tablet Take 81 mg by mouth daily.    [provider]  atorvastatin (LIPITOR) 10 MG tablet Take 1 tablet by mouth daily. 08/01/18   [provider]  benazepril (LOTENSIN) 5 MG tablet Take 1 tablet by mouth daily. 08/01/18   [provider]  budesonide-formoterol (SYMBICORT) 160-4.5 MCG/ACT inhaler Inhale 2 puffs into the lungs 2 (two) times daily.    [provider]  chlorthalidone (HYGROTON) 25 MG tablet Take 25 mg by mouth daily.    [provider]  diltiazem (  CARDIZEM CD) 120 MG 24 hr capsule Take 1 capsule by mouth daily. 08/01/18   [provider]  famotidine (PEPCID) 40 MG tablet Take 1 tablet by mouth daily as needed. 08/21/18   [provider]  gabapentin (NEURONTIN) 300 MG capsule Take 300 mg by mouth 3 (three) times daily.  03/22/13   [provider]  ipratropium-albuterol (DUONEB) 0.5-2.5 (3) MG/3ML SOLN Inhale 3 mLs into the lungs every 6 (six) hours as needed (for shortness of breath/wheezing).  07/17/14   [provider]  isosorbide mononitrate (IMDUR) 30 MG 24 hr tablet Take 30 mg by mouth daily. 03/22/14    [provider]  metFORMIN (GLUCOPHAGE) 500 MG tablet Take 500 mg by mouth daily.    [provider]  metoprolol succinate (TOPROL-XL) 50 MG 24 hr tablet Take 50 mg by mouth every morning. Take with or immediately following a meal.    [provider]  multivitamin (ONE-A-DAY MEN'S) TABS Take 1 tablet by mouth daily.      [provider]  OXYGEN Inhale 2 L into the lungs daily as needed.     [provider]  polyethylene glycol (MIRALAX / GLYCOLAX) packet Take 17 g by mouth daily as needed for mild constipation.    [provider]  potassium chloride (K-DUR) 10 MEQ tablet Take 1 tablet (10 mEq total) by mouth daily for 5 days. 09/30/18 10/05/18  Ronnie Doss A, PA-C  potassium chloride SA (K-DUR,KLOR-CON) 20 MEQ tablet Take 20 mEq by mouth daily.  05/12/14   [provider]  predniSONE (DELTASONE) 10 MG tablet Take 4 tablets (40 mg total) by mouth daily for 5 days. 09/30/18 10/05/18  Ronnie Doss A, PA-C  ranitidine (ZANTAC) 300 MG tablet Take 1 tablet by mouth 2 (two) times daily. 06/12/18   [provider]  simvastatin (ZOCOR) 20 MG tablet Take 20 mg by mouth at bedtime.  03/22/14   [provider]  sucralfate (CARAFATE) 1 g tablet Take 1 tablet by mouth 4 (four) times daily. 07/19/18   [provider]  tiotropium (SPIRIVA) 18 MCG inhalation capsule Place 18 mcg into inhaler and inhale daily.    [provider]    Family History Family History  Problem Relation Age of Onset  . Emphysema Father     Social History Social History   Tobacco Use  . Smoking status: Former Smoker    Packs/day: 1.00    Years: 35.00    Pack years: 35.00    Types: Cigarettes    Last attempt to quit: 05/09/2013    Years since quitting: 5.3  . Smokeless tobacco: Never Used  Substance Use Topics  . Alcohol use: No  . Drug use: No     Allergies   Codeine   Review of Systems Review of Systems  Constitutional:  Positive for fatigue. Negative for chills, diaphoresis and fever.  HENT: Negative for congestion.   Respiratory: Positive for shortness of breath. Negative for cough and chest tightness.   Cardiovascular: Negative for chest pain and palpitations.  Gastrointestinal: Negative for abdominal pain, diarrhea, nausea and vomiting.  Genitourinary: Negative for dysuria.  Musculoskeletal: Negative for back pain.  Skin: Negative for rash.  Allergic/Immunologic: Negative for immunocompromised state.  Neurological: Negative for dizziness, light-headedness and headaches.     Physical Exam Updated Vital Signs BP 140/79   Pulse 89   Temp 98 F (36.7 C) (Oral)   Resp 20   Ht  (1.727 m)   Wt  52.6 kg   SpO2 100%   BMI 17.64 kg/m   Physical Exam Vitals signs and nursing note reviewed.  Constitutional:      General: He is not in acute distress.    Appearance: Normal appearance. He is well-developed and normal weight. He is not ill-appearing, toxic-appearing or diaphoretic.  HENT:     Head: Normocephalic.  Eyes:     Conjunctiva/sclera: Conjunctivae normal.     Pupils: Pupils are equal, round, and reactive to light.  Cardiovascular:     Rate and Rhythm: Normal rate and regular rhythm.  Pulmonary:     Effort: Pulmonary effort is normal.     Breath sounds: Examination of the right-upper field reveals wheezing. Examination of the left-upper field reveals wheezing. Examination of the right-middle field reveals wheezing. Examination of the left-middle field reveals wheezing. Examination of the left-lower field reveals wheezing. Decreased breath sounds and wheezing present. No rhonchi or rales.  Chest:     Chest wall: No mass, deformity or tenderness.  Musculoskeletal:     Right lower leg: No edema.     Left lower leg: No edema.  Skin:    General: Skin is dry.  Neurological:     Mental Status: He is alert.  Psychiatric:        Mood and Affect: Mood normal.      ED Treatments / Results   Labs (all labs ordered are listed, but only abnormal results are displayed) Labs Reviewed  BASIC METABOLIC PANEL - Abnormal; Notable for the following components:      Result Value   Sodium 134 (*)    Potassium 2.9 (*)    Chloride 91 (*)    Glucose, Bld 117 (*)    BUN 7 (*)    Creatinine, Ser 0.50 (*)    All other components within normal limits  CBC WITH DIFFERENTIAL/PLATELET - Abnormal; Notable for the following components:   Hemoglobin 12.4 (*)    HCT 37.9 (*)    Monocytes Absolute 1.1 (*)    Abs Immature Granulocytes 0.14 (*)    All other components within normal limits  SARS CORONAVIRUS 2 (HOSPITAL ORDER, PERFORMED IN [facility])  BRAIN NATRIURETIC PEPTIDE  TROPONIN I    EKG None  Radiology Dg Chest Port 1 View  Result Date: 09/30/2018 CLINICAL DATA:  Shortness of breath EXAM: PORTABLE CHEST 1 VIEW COMPARISON:  09/17/2018, 08/26/2018, 11/03/2017 FINDINGS: Hyperinflated lungs. No acute consolidation or effusion. Normal cardiomediastinal silhouette. No pneumothorax. Chronic scarring in the right middle lobe IMPRESSION: No active disease. Hyperinflation with scarring in the right middle lobe Electronically Signed   By: Jasmine PangKim  Fujinaga M.D.   On: 09/30/2018 21:22    Procedures Procedures (including critical care time)  Medications Ordered in ED Medications  potassium chloride (KLOR-CON) packet 40 mEq (has no administration in time range)  ipratropium-albuterol (DUONEB) 0.5-2.5 (3) MG/3ML nebulizer solution 3 mL (3 mLs Nebulization Given 09/30/18 2333)  albuterol (VENTOLIN HFA) 108 (90 Base) MCG/ACT inhaler 6 puff (6 puffs Inhalation Given 09/30/18 2126)  methylPREDNISolone sodium succinate (SOLU-MEDROL) 125 mg/2 mL injection 125 mg (125 mg Intravenous Given 09/30/18 2157)     Initial Impression / Assessment and Plan / ED Course  I have reviewed the triage vital signs and the nursing notes.  Pertinent labs & imaging results that were available during my care  of the patient were reviewed by me and considered in my medical decision making (see chart for details).  Clinical Course as of Sep 29 2337  Sun Sep 30, 2018  2213 68 y/o gentleman with COPD on home o2 with SOB for 2-3 days.    [KM]  2253 Patient appears well and 100% on his home oxygen 2L. No white count, cough or sputum production to suggest infection. Temp is 98.0. Normal chest xray and BNP. Waiting on covid testing which is likely negative. I suspect mild COPD exacerbation. Will treat with ipratropium, albuterol inhalers, IV solumedrol and monitor.   [KM]  2313 Patient covid test negative and remaining labs reassuring. He does have a potassium of 2.9, no EKG changes, will repleat with oral K. Will give 3 duoneb treatments. Patient reports 100% on his normal oxygen requirement. Likely will be discharged after duoneb treatments if he can ambulate without difficulty or decrease in oxygen    [KM]  2318 Patient oxygen sats remained 93%-100% on his normal oxygen. No requirement for admission, no indication for antibiotics. Will send home with another round of prednisone. It was advised the patient f/u with pulmonology as he seems to always get worse when he completes prednisone. Referred to Dr. Juanetta Gosling   [KM]    Clinical Course User Index [KM] Arlyn Dunning, PA-C         Final Clinical Impressions(s) / ED Diagnoses   Final diagnoses:  COPD exacerbation (HCC)  Hypokalemia    ED Discharge Orders         Ordered    predniSONE (DELTASONE) 10 MG tablet  Daily     09/30/18 2339    potassium chloride (K-DUR) 10 MEQ tablet  Daily     09/30/18 2339           Jeral Pinch 09/30/18 2340    Cathren Laine, MD 10/01/18 1525

## 2018-09-30 NOTE — ED Triage Notes (Signed)
Pt with copd, sob since Friday, pt on 3 L/M continuously

## 2018-10-01 NOTE — ED Notes (Signed)
Pt still receiving neb tx at this time.

## 2018-10-02 DIAGNOSIS — J449 Chronic obstructive pulmonary disease, unspecified: Secondary | ICD-10-CM | POA: Diagnosis not present

## 2018-10-02 DIAGNOSIS — J9611 Chronic respiratory failure with hypoxia: Secondary | ICD-10-CM | POA: Diagnosis not present

## 2018-10-03 ENCOUNTER — Ambulatory Visit: Payer: Medicare Other | Admitting: Internal Medicine

## 2018-10-03 ENCOUNTER — Other Ambulatory Visit: Payer: Self-pay

## 2018-10-03 ENCOUNTER — Telehealth: Payer: Self-pay | Admitting: Internal Medicine

## 2018-10-03 ENCOUNTER — Encounter: Payer: Self-pay | Admitting: Internal Medicine

## 2018-10-03 DIAGNOSIS — J441 Chronic obstructive pulmonary disease with (acute) exacerbation: Secondary | ICD-10-CM | POA: Diagnosis not present

## 2018-10-03 DIAGNOSIS — J9611 Chronic respiratory failure with hypoxia: Secondary | ICD-10-CM

## 2018-10-03 DIAGNOSIS — I10 Essential (primary) hypertension: Secondary | ICD-10-CM | POA: Diagnosis not present

## 2018-10-03 MED ORDER — ALBUTEROL SULFATE (2.5 MG/3ML) 0.083% IN NEBU: 2.5000 mg | INHALATION_SOLUTION | RESPIRATORY_TRACT | 0 refills | Status: DC | PRN

## 2018-10-03 MED ORDER — TELMISARTAN 40 MG PO TABS: 40.0000 mg | ORAL_TABLET | Freq: Every day | ORAL | 11 refills | Status: DC

## 2018-10-03 MED ORDER — FAMOTIDINE 40 MG PO TABS: ORAL_TABLET | ORAL | Status: AC

## 2018-10-03 MED ORDER — IPRATROPIUM-ALBUTEROL 0.5-2.5 (3) MG/3ML IN SOLN: 3.0000 mL | Freq: Four times a day (QID) | RESPIRATORY_TRACT | 0 refills | Status: DC

## 2018-10-03 NOTE — Telephone Encounter (Signed)
Returned call to patient's spouse Micah Flesher over AVS Pt is to stop old bp med and start [employer] and stop whatever he had. Use rescue inhaler prn. She verbalized understanding nothing further needed.

## 2018-10-03 NOTE — Patient Instructions (Addendum)
Take omeprazole 20 mg x 2  About 30-60 before your first meal  Take famotidine 40 mg after supper or before bed   Stop all inhalers and just use the nebulizer = duoneb ipratropium /albuterol breafast lunch supper and bedtime automatically.  Only use your albuterol nebulizer as a rescue medication to be used if you can't catch your breath by resting or doing a relaxed purse lip breathing pattern.  - The less you use it, the better it will work when you need it. - Ok to use up to  every 3  hours if you must but call for immediate appointment if use goes up over your usual need  Stop benazapril and start  micardis (telmisartan) 40 mg one daily in its place and call me if there is a problem   Prednisone 20 mg this evening,  Then 20 mg x 2 days, 10 mg x 2 days then stay on 5 mg daily but if get worse go back to 20 mg each am and start over   Please schedule a follow up office visit in 4 weeks, sooner if needed  with all medications /inhalers/ solutions in hand so we can verify exactly what you are taking. This includes all medications from all doctors and over the counters

## 2018-10-03 NOTE — Progress Notes (Signed)
Jeffery [name], male    DOB: [birth year],    MRN: [id number]   Brief patient profile:  79 yobm quit smoking 05/2013 with onset of doe around 2013 and at the point he quit smoking already on neb qid and then placed on variable inhalers and 02 but does not recall ever  pfts  Seen in ER x 6 x in 2020 so referred to pulmonary clinic 10/03/2018 by Dr   Jeffery [name] steroid dep x 2019.    History of Present Illness  10/03/2018  Pulmonary/ 1st office eval/Jeffery [name]  On ACEi/dpi's including spiriva  Chief Complaint  Patient presents with   Consult    Consult [id number]. States his breathing has its good and bad days. States he has been on oxygen for about 2 years and also uses at night.   Dyspnea:  Much better p ER rx x 6  But only temporarily while  On pred 40 mg then back to 5 mg and flares w/in a week  Cough: dry/choking sensation/ worse when use inhalers esp dpi's Sleep: prone / bed is flat SABA use: multiple and not helpful  02 2lpm hs   No obvious day to day or daytime variability or assoc excess/ purulent sputum or mucus plugs or hemoptysis or cp or chest tightness, subjective wheeze or overt sinus or hb symptoms.   Sleeping  without nocturnal  or early am exacerbation  of respiratory  c/o's or need for noct saba. Also denies any obvious fluctuation of symptoms with weather or environmental changes or other aggravating or alleviating factors except as outlined above   No unusual exposure hx or h/o childhood pna/ asthma or knowledge of premature birth.  Current Allergies, Complete Past Medical History, Past Surgical History, Family History, and Social History were reviewed in [employer] record.  ROS  The following are not active complaints unless bolded Hoarseness, sore throat, dysphagia, dental problems, itching, sneezing,  nasal congestion or discharge of excess mucus or purulent secretions, ear ache,   fever, chills, sweats, unintended wt loss or wt gain, classically  pleuritic or exertional cp,  orthopnea pnd or arm/hand swelling  or leg swelling, presyncope, palpitations, abdominal pain, anorexia, nausea, vomiting, diarrhea  or change in bowel habits or change in bladder habits, change in stools or change in urine, dysuria, hematuria,  rash, arthralgias, visual complaints, headache, numbness, weakness or ataxia or problems with walking or coordination,  change in mood or  memory.           Past Medical History:  Diagnosis Date   Chest pain 04/2011   Normal echocardiogram   COPD (chronic obstructive pulmonary disease) (HCC)    Fasting hyperglycemia    Normal hemoglobin A1c of 6.0   GERD (gastroesophageal reflux disease)    Hyperlipemia    Hypertension    On home O2    2L N/C   On home O2    at night   Prediabetes 06/04/2015   Tobacco abuse     Outpatient Medications Prior to Visit  Medication Sig Dispense Refill   albuterol (PROVENTIL HFA;VENTOLIN HFA) 108 (90 Base) MCG/ACT inhaler Inhale 2 puffs into the lungs every 4 (four) hours as needed for wheezing or shortness of breath. 1 Inhaler 0   albuterol (PROVENTIL) (2.5 MG/3ML) 0.083% nebulizer solution Take 3 mLs (2.5 mg total) by nebulization every 4 (four) hours as needed for wheezing or shortness of breath. 75 mL 0   ALPRAZolam (XANAX) 0.5 MG tablet Take 0.5 mg by  mouth at bedtime.     aspirin EC 81 MG tablet Take 81 mg by mouth daily.     atorvastatin (LIPITOR) 10 MG tablet Take 1 tablet by mouth daily.     benazepril (LOTENSIN) 5 MG tablet Take 1 tablet by mouth daily.     budesonide-formoterol (SYMBICORT) 160-4.5 MCG/ACT inhaler Inhale 2 puffs into the lungs 2 (two) times daily.     chlorthalidone (HYGROTON) 25 MG tablet Take 25 mg by mouth daily.     diltiazem (CARDIZEM CD) 120 MG 24 hr capsule Take 1 capsule by mouth daily.     famotidine (PEPCID) 40 MG tablet Take 1 tablet by mouth daily as needed.     gabapentin (NEURONTIN) 300 MG capsule Take 300 mg by mouth 3  (three) times daily.      ipratropium (ATROVENT HFA) 17 MCG/ACT inhaler Inhale 2 puffs into the lungs every 6 (six) hours as needed for wheezing.     ipratropium-albuterol (DUONEB) 0.5-2.5 (3) MG/3ML SOLN Inhale 3 mLs into the lungs every 6 (six) hours as needed (for shortness of breath/wheezing).   0   isosorbide mononitrate (IMDUR) 30 MG 24 hr tablet Take 30 mg by mouth daily.  1   metFORMIN (GLUCOPHAGE) 500 MG tablet Take 500 mg by mouth daily.     metoprolol succinate (TOPROL-XL) 50 MG 24 hr tablet Take 50 mg by mouth every morning. Take with or immediately following a meal.     mometasone-formoterol (DULERA) 200-5 MCG/ACT AERO Inhale 2 puffs into the lungs 2 (two) times daily.     multivitamin (ONE-A-DAY MEN'S) TABS Take 1 tablet by mouth daily.       omeprazole (PRILOSEC) 20 MG capsule Take 20 mg by mouth daily.     OXYGEN Inhale 2 L into the lungs daily as needed.      polyethylene glycol (MIRALAX / GLYCOLAX) packet Take 17 g by mouth daily as needed for mild constipation.     potassium chloride (K-DUR) 10 MEQ tablet      potassium chloride SA (K-DUR,KLOR-CON) 20 MEQ tablet Take 20 mEq by mouth daily.   1   predniSONE (DELTASONE) 10 MG tablet Take 4 tablets (40 mg total) by mouth daily for 5 days. 20 tablet 0   ranitidine (ZANTAC) 300 MG tablet Take 1 tablet by mouth 2 (two) times daily.     simvastatin (ZOCOR) 20 MG tablet Take 20 mg by mouth at bedtime.   1   sucralfate (CARAFATE) 1 g tablet Take 1 tablet by mouth 4 (four) times daily.     tiotropium (SPIRIVA) 18 MCG inhalation capsule Place 18 mcg into inhaler and inhale daily.     potassium chloride (K-DUR) 10 MEQ tablet Take 1 tablet (10 mEq total) by mouth daily for 5 days. (Patient not taking: Reported on 10/03/2018) 5 tablet 0      Objective:     BP 110/82 (BP Location: Left Arm, Cuff Size: Normal)    Pulse 86    Ht 5\' 6"  (1.676 m)    Wt 114 lb 6.4 oz (51.9 kg)    SpO2 98%    BMI 18.46 kg/m   SpO2: 98 % O2  Type: Pulse O2 O2 Flow Rate (L/min): 2 L/min   Thin bm nad    HEENT: nl dentition / oropharynx. Nl external ear canals without cough reflex -  Mild bilateral non-specific turbinate edema     NECK :  without JVD/Nodes/TM/ nl carotid upstrokes bilaterally   LUNGS: no acc  muscle use,  Mod barrel  contour chest wall with bilateral  Distant bs s audible wheeze and  without cough on insp or exp maneuver and mod  Hyperresonant  to  percussion bilaterally     CV:  RRR  no s3 or murmur or increase in P2, and no edema   ABD:  soft and nontender with pos mid  insp Hoover's  in the supine position. No bruits or organomegaly appreciated, bowel sounds nl  MS:   Nl gait/  ext warm without deformities, calf tenderness, cyanosis or clubbing No obvious joint restrictions   SKIN: warm and dry without lesions    NEURO:  alert, approp, nl sensorium with  no motor or cerebellar deficits apparent.       I personally reviewed images and agree with radiology impression as follows:  CXR:   09/30/18 No active disease. Hyperinflation with scarring in the right middle lobe     Assessment   COPD exacerbation (HCC) Quit smoking  05/2013 and steroid dep since 2019  - 10/03/2018  After extensive coaching inhaler device,  effectiveness =    0% (can't use them) - d/c all inhalers x for neb 10/03/2018 and d/c ACEi as well    DDX of  difficult airways management almost all start with A and  include Adherence, Ace Inhibitors, Acid Reflux, Active Sinus Disease, Alpha 1 Antitripsin deficiency, Anxiety masquerading as Airways dz,  ABPA,  Allergy(esp in young), Aspiration (esp in elderly), Adverse effects of meds,  Active smoking or vaping, A bunch of PE's (a small clot burden can't cause this syndrome unless there is already severe underlying pulm or vascular dz with poor reserve) plus two Bs  = Bronchiectasis and Beta blocker use..and one C= CHF   Adherence is always the initial "prime suspect" and is a multilayered  concern that requires a "trust but verify" approach in every patient - starting with knowing how to use medications, especially inhalers, correctly, keeping up with refills and understanding the fundamental difference between maintenance and prns vs those medications only taken for a very short course and then stopped and not refilled.  - note always better during admit - see hfa teaching > unable to use effectively so change entirely to nebs now  - return with all meds in hand using a trust but verify approach to confirm accurate Medication  Reconciliation The principal here is that until we are certain that the  patients are doing what we've asked, it makes no sense to ask them to do more.    ACEi adverse effects at the  top of the usual list of suspects and the only way to rule it out is a trial off > see a/p    ? Adverse effects of dpi's > stop   ? Acid (or non-acid) GERD > always difficult to exclude as up to 75% of pts in some series report no assoc GI/ Heartburn symptoms> rec max (24h)  acid suppression and diet restrictions/ reviewed and instructions given in writing.   ? Allergy > slow taper prednisone. The goal with a chronic steroid dependent illness is always arriving at the lowest effective dose that controls the disease/symptoms and not accepting a set "formula" which is based on statistics or guidelines that don't always take into account patient  variability or the natural hx of the dz in every individual patient, which may well vary over time.  For now therefore I recommend the patient maintain  Ceiling of 20 mg and  floor of 5 mg for now   ? Alpha one AT def > extremely rare in AA  ? CHF/ cardiac asthma>  bnp very low during flares excludes    >>> f/u q 4 weeks for now, sooner if needed     Essential hypertension In the best review of chronic cough to date ( NEJM 2016 375 1544-1551) ,  ACEi are now felt to cause cough in up to  20% of pts which is a 4 fold increase from  previous reports and does not include the variety of non-specific complaints we see in pulmonary clinic in pts on ACEi but previously attributed to another dx like  Copd/asthma and  include PNDS, throat and chest congestion, "bronchitis", unexplained dyspnea and noct "strangling" sensations, and hoarseness, but also  atypical /refractory GERD symptoms like dysphagia and "bad heartburn"   The only way I know  to prove this is not an "ACEi Case" is a trial off ACEi x a minimum of 6 weeks then regroup.    >>> Try micardis 40 mg daily and f/u in 4 weeks       Chronic respiratory failure with hypoxia (HCC) 02 2lpm 24/7 as of 10/03/2018   Adequate control on present rx, reviewed in detail with pt > no change in rx needed       Total time devoted to counseling  > 50 % of initial 60 min office visit:  review case with pt/wife [name]/   device teaching which extended face to face time for this visit  discussion of options/alternatives/ personally creating written customized instructions  in presence of pt  then going over those specific  Instructions directly with the pt including how to use all of the meds but in particular covering each new medication in detail and the difference between the maintenance= "automatic" meds and the prns using an action plan format for the latter (If this problem/symptom => do that organization reading Left to right).  Please see AVS from this visit for a full list of these instructions which I personally wrote for this pt and  are unique to this visit.      Sandrea Hughs, MD 10/03/2018

## 2018-10-04 ENCOUNTER — Encounter: Payer: Self-pay | Admitting: Internal Medicine

## 2018-10-04 DIAGNOSIS — J9611 Chronic respiratory failure with hypoxia: Secondary | ICD-10-CM | POA: Insufficient documentation

## 2018-10-04 NOTE — Assessment & Plan Note (Signed)
02 2lpm 24/7 as of 10/03/2018   Adequate control on present rx, reviewed in detail with pt > no change in rx needed     Total time devoted to counseling  > 50 % of initial 60 min office visit:  review case with pt/wife [name]/   device teaching which extended face to face time for this visit  discussion of options/alternatives/ personally creating written customized instructions  in presence of pt  then going over those specific  Instructions directly with the pt including how to use all of the meds but in particular covering each new medication in detail and the difference between the maintenance= "automatic" meds and the prns using an action plan format for the latter (If this problem/symptom => do that organization reading Left to right).  Please see AVS from this visit for a full list of these instructions which I personally wrote for this pt and  are unique to this visit.

## 2018-10-04 NOTE — Assessment & Plan Note (Addendum)
Quit smoking  05/2013 and steroid dep since 2019  - 10/03/2018  After extensive coaching inhaler device,  effectiveness =    0% (can't use them) - d/c all inhalers x for neb 10/03/2018 and d/c ACEi as well    DDX of  difficult airways management almost all start with A and  include Adherence, Ace Inhibitors, Acid Reflux, Active Sinus Disease, Alpha 1 Antitripsin deficiency, Anxiety masquerading as Airways dz,  ABPA,  Allergy(esp in young), Aspiration (esp in elderly), Adverse effects of meds,  Active smoking or vaping, A bunch of PE's (a small clot burden can't cause this syndrome unless there is already severe underlying pulm or vascular dz with poor reserve) plus two Bs  = Bronchiectasis and Beta blocker use..and one C= CHF   Adherence is always the initial "prime suspect" and is a multilayered concern that requires a "trust but verify" approach in every patient - starting with knowing how to use medications, especially inhalers, correctly, keeping up with refills and understanding the fundamental difference between maintenance and prns vs those medications only taken for a very short course and then stopped and not refilled.  - note always better during admit - see hfa teaching > unable to use effectively so change entirely to nebs now  - return with all meds in hand using a trust but verify approach to confirm accurate Medication  Reconciliation The principal here is that until we are certain that the  patients are doing what we've asked, it makes no sense to ask them to do more.    ACEi adverse effects at the  top of the usual list of suspects and the only way to rule it out is a trial off > see a/p    ? Adverse effects of dpi's > stop   ? Acid (or non-acid) GERD > always difficult to exclude as up to 75% of pts in some series report no assoc GI/ Heartburn symptoms> rec max (24h)  acid suppression and diet restrictions/ reviewed and instructions given in writing.   ? Allergy > slow taper  prednisone. The goal with a chronic steroid dependent illness is always arriving at the lowest effective dose that controls the disease/symptoms and not accepting a set "formula" which is based on statistics or guidelines that don't always take into account patient  variability or the natural hx of the dz in every individual patient, which may well vary over time.  For now therefore I recommend the patient maintain  Ceiling of 20 mg and floor of 5 mg for now   ? Alpha one AT def > extremely rare in AA  ? CHF/ cardiac asthma>  bnp very low during flares excludes    >>> f/u q 4 weeks for now, sooner if needed

## 2018-10-04 NOTE — Assessment & Plan Note (Signed)
In the best review of chronic cough to date ( NEJM 2016 [phone] [id number]) ,  ACEi are now felt to cause cough in up to  20% of pts which is a 4 fold increase from previous reports and does not include the variety of non-specific complaints we see in pulmonary clinic in pts on ACEi but previously attributed to another dx like  Copd/asthma and  include PNDS, throat and chest congestion, "bronchitis", unexplained dyspnea and noct "strangling" sensations, and hoarseness, but also  atypical /refractory GERD symptoms like dysphagia and "bad heartburn"   The only way I know  to prove this is not an "ACEi Case" is a trial off ACEi x a minimum of 6 weeks then regroup.    >>> Try micardis 40 mg daily and f/u in 4 weeks

## 2018-10-08 ENCOUNTER — Telehealth: Payer: Self-pay | Admitting: Internal Medicine

## 2018-10-08 NOTE — Telephone Encounter (Signed)
Primary Pulmonologist: MW Last office visit and with whom: 10/03/2018 with MW What do we see them for (pulmonary problems): COPD Last OV assessment/plan: Assessment & Plan Note by Tanda Rockers, MD at 10/04/2018 6:35 AM  Author: Tanda Rockers, MD Author Type: Physician Filed: 10/04/2018 6:35 AM  Note Status: Written Cosign: Cosign Not Required Encounter Date: 10/03/2018  Problem: Chronic respiratory failure with hypoxia ([place])  Editor: Tanda Rockers, MD (Physician)    02 2lpm 24/7 as of 10/03/2018   Adequate control on present rx, reviewed in detail with pt > no change in rx needed     Total time devoted to counseling  > 50 % of initial 60 min office visit:  review case with pt/wife [name]/   device teaching which extended face to face time for this visit  discussion of options/alternatives/ personally creating written customized instructions  in presence of pt  then going over those specific  Instructions directly with the pt including how to use all of the meds but in particular covering each new medication in detail and the difference between the maintenance= "automatic" meds and the prns using an action plan format for the latter (If this problem/symptom => do that organization reading Left to right).  Please see AVS from this visit for a full list of these instructions which I personally wrote for this pt and  are unique to this visit.     Assessment & Plan Note by Tanda Rockers, MD at 10/04/2018 6:33 AM  Author: Tanda Rockers, MD Author Type: Physician Filed: 10/04/2018 6:34 AM  Note Status: Written Cosign: Cosign Not Required Encounter Date: 10/03/2018  Problem: Essential hypertension  Editor: Tanda Rockers, MD (Physician)    In the best review of chronic cough to date ( NEJM 2016 375 [id number]) ,  ACEi are now felt to cause cough in up to  20% of pts which is a 4 fold increase from previous reports and does not include the variety of non-specific complaints we see in pulmonary clinic  in pts on ACEi but previously attributed to another dx like  Copd/asthma and  include PNDS, throat and chest congestion, "bronchitis", unexplained dyspnea and noct "strangling" sensations, and hoarseness, but also  atypical /refractory GERD symptoms like dysphagia and "bad heartburn"   The only way I know  to prove this is not an "ACEi Case" is a trial off ACEi x a minimum of 6 weeks then regroup.    >>> Try micardis 40 mg daily and f/u in 4 weeks        Was appointment offered to patient (explain)?  Pt wants recommendations.   Reason for call: called and spoke with pt who stated he began having complaints of weakness in legs and occ heart flutters. Stated to both pt and his wife that they need to contact PCP in regards to this and they both expressed understanding. Nothing further needed.  (examples of things to ask: : When did symptoms start? Fever? Cough? Productive? Color to sputum? More sputum than usual? Wheezing? Have you needed increased oxygen? Are you taking your respiratory medications? What over the counter measures have you tried?)

## 2018-10-08 NOTE — Telephone Encounter (Signed)
Attempted to call patient back. Wife answered and was not at home. There is not a DPR with wife listed so I was unable to talk with her.  She stated she will have patient call back once she gets home.

## 2018-10-08 NOTE — Telephone Encounter (Signed)
Patient is returning is the call. [phone].

## 2018-10-09 ENCOUNTER — Telehealth: Payer: Self-pay | Admitting: Internal Medicine

## 2018-10-09 NOTE — Telephone Encounter (Signed)
Accidentally closed previous encounter so opened a new one. I have pasted info from other encounter below: Medication Management   You Just now (2:44 PM)      Assessment   COPD exacerbation ([place]) Quit smoking 05/2013 and steroid dep since 2019 - 10/03/2018 After extensive coaching inhaler device, effectiveness = 0% (can't use them) - d/c all inhalers x for neb 10/03/2018 and d/c ACEi as well   DDX of difficult airways management almost all start with Aand include Adherence, Ace Inhibitors, Acid Reflux, Active Sinus Disease,Alpha 1 Antitripsin deficiency,Anxiety masquerading as Airways dz, ABPA, Allergy(esp in young), Aspiration (esp in elderly), Adverse effects of meds,Active smoking or vaping, Abunch of PE's (a small clot burden can't cause this syndrome unless there is already severe underlying pulm or vascular dz with poor reserve) plus two Bs= Bronchiectasis andBeta blocker use..and one C= CHF   Adherence is always the initial "prime suspect" and is a multilayered concern that requires a "trust but verify" approach in every patient - starting with knowing how to use medications, especially inhalers, correctly, keeping up with refills and understanding the fundamental difference between maintenance and prns vs those medications only taken for a very short course and then stopped and not refilled. - note always better during admit - see hfa teaching >unable to use effectively so change entirely to nebs now  - returnwith all meds in hand using a trust but verify approach to confirm accurate Medication Reconciliation The principal here is that until we are certain that the patients are doing what we've asked, it makes no sense to ask them to do more.   ACEi adverse effects at the top of the usual list of suspects and the only way to rule it out is a trial off >see a/p   ? Adverse effects of dpi's > stop  ? Acid (or non-acid) GERD >always difficult to  exclude as up to 75% of pts in some series report no assoc GI/ Heartburn symptoms>rec max (24h) acid suppression and diet restrictions/ reviewed and instructions given in writing.   ? Allergy > slow taper prednisone.The goal with a chronic steroid dependent illness is always arriving at the lowest effective dose that controls the disease/symptoms and not accepting a set "formula" which is based on statistics or guidelines that don't always take into account patient variability or the natural hx of the dz in every individual patient, which may well vary over time. For now therefore I recommend the patient maintain Ceiling of 20 mg and floor of 5 mg for now   ? Alpha one AT def > extremely rare in AA  ? CHF/ cardiac asthma>bnp very low during flares excludes   Instructions from OV with MW 10/03/2018 Take omeprazole 20 mg x 2 About 30-60 before your first meal  Take famotidine 40 mg after supper or before bed   Stop all inhalers and just use the nebulizer = duoneb ipratropium /albuterol breafast lunch supper and bedtime automatically.  Only use your albuterolnebulizeras a rescue medication to be used if you can't catch your breath by resting or doing a relaxed purse lip breathing pattern.  - The less you use it, the better it will work when you need it. - Ok to use up to every 3hours if you must but call for immediate appointment if use goes up over your usual need  Stop benazapril and start micardis (telmisartan) 40 mg one daily in its place and call me if there is a problem  Prednisone 20 mg this evening, Then 20 mg x 2 days, 10 mg x 2 days then stay on 5 mg daily but if get worse go back to 20 mg each am and start over   Please schedule a follow up office visit in 4 weeks, sooner if needed with all medications /inhalers/ solutions in hand so we can verify exactly what you are taking. This includes all medications from all doctors and over the counters      Attempted to call pt's wife Octavio GravesBonita but unable to reach.left message for her to return call.

## 2018-10-09 NOTE — Telephone Encounter (Signed)
Pt wife returning call and can be reached @ [phone].Jeffery [name]

## 2018-10-09 NOTE — Telephone Encounter (Signed)
Called and spoke with pt's wife [place]. I stated to [facility] the reason why pt was taken off of the inhalers and placed on the nebulizer solutions was due to MW saying that pt could not effectively use the inhalers. Stated to her that MW does inhaler coaching if a pt is currently on an inhaler or if he is wanting to put pt on an inhaler so that way he will know if they will be able to use them and I stated to her that pt could not use the inhaler when the coaching was done with it.  [name] expressed understanding. Nothing further needed.

## 2018-10-09 NOTE — Telephone Encounter (Signed)
Assessment   COPD exacerbation ([facility]) Quit smoking  05/2013 and steroid dep since 2019  - 10/03/2018  After extensive coaching inhaler device,  effectiveness =    0% (can't use them) - d/c all inhalers x for neb 10/03/2018 and d/c ACEi as well    DDX of  difficult airways management almost all start with A and  include Adherence, Ace Inhibitors, Acid Reflux, Active Sinus Disease, Alpha 1 Antitripsin deficiency, Anxiety masquerading as Airways dz,  ABPA,  Allergy(esp in young), Aspiration (esp in elderly), Adverse effects of meds,  Active smoking or vaping, A bunch of PE's (a small clot burden can't cause this syndrome unless there is already severe underlying pulm or vascular dz with poor reserve) plus two Bs  = Bronchiectasis and Beta blocker use..and one C= CHF   Adherence is always the initial "prime suspect" and is a multilayered concern that requires a "trust but verify" approach in every patient - starting with knowing how to use medications, especially inhalers, correctly, keeping up with refills and understanding the fundamental difference between maintenance and prns vs those medications only taken for a very short course and then stopped and not refilled.  - note always better during admit - see hfa teaching > unable to use effectively so change entirely to nebs now  - return with all meds in hand using a trust but verify approach to confirm accurate Medication  Reconciliation The principal here is that until we are certain that the  patients are doing what we've asked, it makes no sense to ask them to do more.    ACEi adverse effects at the  top of the usual list of suspects and the only way to rule it out is a trial off > see a/p    ? Adverse effects of dpi's > stop   ? Acid (or non-acid) GERD > always difficult to exclude as up to 75% of pts in some series report no assoc GI/ Heartburn symptoms> rec max (24h)  acid suppression and diet restrictions/ reviewed and instructions  given in writing.   ? Allergy > slow taper prednisone. The goal with a chronic steroid dependent illness is always arriving at the lowest effective dose that controls the disease/symptoms and not accepting a set "formula" which is based on statistics or guidelines that don't always take into account patient  variability or the natural hx of the dz in every individual patient, which may well vary over time.  For now therefore I recommend the patient maintain  Ceiling of 20 mg and floor of 5 mg for now   ? Alpha one AT def > extremely rare in AA  ? CHF/ cardiac asthma>  bnp very low during flares excludes   Instructions from OV with MW 10/03/2018 Take omeprazole 20 mg x 2  About 30-60 before your first meal  Take famotidine 40 mg after supper or before bed   Stop all inhalers and just use the nebulizer = duoneb ipratropium /albuterol breafast lunch supper and bedtime automatically.  Only use your albuterol nebulizer as a rescue medication to be used if you can't catch your breath by resting or doing a relaxed purse lip breathing pattern.  - The less you use it, the better it will work when you need it. - Ok to use up to  every 3  hours if you must but call for immediate appointment if use goes up over your usual need  Stop benazapril and start  micardis (telmisartan) 40  mg one daily in its place and call me if there is a problem   Prednisone 20 mg this evening,  Then 20 mg x 2 days, 10 mg x 2 days then stay on 5 mg daily but if get worse go back to 20 mg each am and start over   Please schedule a follow up office visit in 4 weeks, sooner if needed  with all medications /inhalers/ solutions in hand so we can verify exactly what you are taking. This includes all medications from all doctors and over the counters     Attempted to call pt's wife Doroteo Bradford but unable to reach.left message for her to return call.

## 2018-10-11 ENCOUNTER — Other Ambulatory Visit: Payer: Self-pay | Admitting: Internal Medicine

## 2018-10-12 ENCOUNTER — Telehealth: Payer: Self-pay | Admitting: Internal Medicine

## 2018-10-12 MED ORDER — ALBUTEROL SULFATE (2.5 MG/3ML) 0.083% IN NEBU: 2.5000 mg | INHALATION_SOLUTION | RESPIRATORY_TRACT | 11 refills | Status: DC | PRN

## 2018-10-12 NOTE — Telephone Encounter (Signed)
Refill of albuterol neb sol has been sent to pt's preferred pharmacy. Called and spoke with pt's wife Doroteo Bradford letting her know this had been done and she verbalized understanding. Nothing further needed.

## 2018-10-16 ENCOUNTER — Other Ambulatory Visit: Payer: Self-pay | Admitting: Internal Medicine

## 2018-10-18 ENCOUNTER — Other Ambulatory Visit: Payer: Self-pay | Admitting: Pharmacist

## 2018-10-18 NOTE — Patient Outreach (Signed)
[facility]) Care Management [place]  10/18/2018  ATZEL MCCAMBRIDGE 09-Feb-1951 [id number]  Reason for referral: medication assistance  [facility] pharmacy case is being closed due to the following reasons:  Goals have been met. Patient is now receiving care under a pulmonologist who will direct his medication therapy for COPD.  Patient has been provided [facility] CM contact information if assistance needed in the future.    Thank you for allowing [facility] pharmacy to be involved in this patient's care.     Regina Eck, PharmD, [place]  [phone]

## 2018-10-25 ENCOUNTER — Ambulatory Visit: Payer: Self-pay | Admitting: Pharmacist

## 2018-10-31 ENCOUNTER — Other Ambulatory Visit: Payer: Self-pay

## 2018-10-31 ENCOUNTER — Ambulatory Visit: Payer: Medicare Other | Admitting: Internal Medicine

## 2018-10-31 ENCOUNTER — Encounter: Payer: Self-pay | Admitting: Internal Medicine

## 2018-10-31 DIAGNOSIS — I10 Essential (primary) hypertension: Secondary | ICD-10-CM | POA: Diagnosis not present

## 2018-10-31 DIAGNOSIS — J449 Chronic obstructive pulmonary disease, unspecified: Secondary | ICD-10-CM | POA: Diagnosis not present

## 2018-10-31 DIAGNOSIS — J9611 Chronic respiratory failure with hypoxia: Secondary | ICD-10-CM

## 2018-10-31 MED ORDER — PREDNISONE 5 MG PO TABS: ORAL_TABLET | ORAL | 2 refills | Status: DC

## 2018-10-31 NOTE — Patient Instructions (Addendum)
Reduce your telmisartan to 40 mg one half a pill daily    Duoneb=  ipratropium /albuterol breafast lunch supper and bedtime automatically.   As a backup >>> Only use your albuterol nebulizer as a rescue medication to be used if you can't catch your breath by resting or doing a relaxed purse lip breathing pattern.  - The less you use it, the better it will work when you need it. - Ok to use up to  every 3  hours if you must but call for immediate appointment if use goes up over your usual need or resume prednisone at 20 mg     Prednisone is 20 mg per day  Until  (5mg  x 4 pills) until better then taper to 5 mg  - if worse go back up    Please schedule a follow up office visit in 4 weeks, sooner if needed  with all medications /inhalers/ solutions in hand so we can verify exactly what you are taking. This includes all medications from all doctors and over the counters Add: consider adding budesonide to neb next ov if can't reduce

## 2018-10-31 NOTE — Progress Notes (Signed)
Jeffery [name], male    DOB: 1950/06/19,    MRN: [id number]   Brief patient profile:  [age] yobm quit smoking 05/2013 with onset of doe around 2013 and at the point he quit smoking already on neb qid and then placed on variable inhalers and 02 but does not recall ever  pfts  Seen in ER x 6 x in 2020 so referred to pulmonary clinic 10/03/2018 by Dr   Kreg ShropshireHasanej steroid dep x 2019.    History of Present Illness  10/03/2018  Pulmonary/ 1st office eval/Wert  On ACEi/dpi's including spiriva  Chief Complaint  Patient presents with  . Consult    Consult AO:ZHYQre:COPD. States his breathing has its good and bad days. States he has been on oxygen for about 2 years and also uses at night.   Dyspnea:  Much better p ER rx x 6  But only temporarily while  On pred 40 mg then back to 5 mg and flares w/in a week  Cough: dry/choking sensation/ worse when use inhalers esp dpi's Sleep: prone / bed is flat SABA use: multiple and not helpful  02 2lpm hs  rec Take omeprazole 20 mg x 2  About 30-60 before your first meal Take famotidine 40 mg after supper or before bed  Stop all inhalers and just use the nebulizer = duoneb ipratropium /albuterol breafast lunch supper and bedtime automatically. Only use your albuterol nebulizer as a rescue medication  Stop benazapril and start  micardis (telmisartan) 40 mg one daily in its place and call me if there is a problem  Prednisone 20 mg this evening,  Then 20 mg x 2 days, 10 mg x 2 days then stay on 5 mg daily but if get worse go back to 20 mg each am and start over   10/31/2018  f/u ov/Wert re:  Copd/ ? acei contributing to some of his symptoms?  Chief Complaint  Patient presents with  . Follow-up    Breathing is doing well today. He states his throat feels dry.    Dyspnea:  Walked to stop sign and back s stopping x two = 250 ft? Cough: better / no longer choking but throat still doesn't feel quite right  Sleeping: ok p xanax  SABA use: avg 2-3 daily  02:  2lpm NP 24/7    No obvious day to day or daytime variability or assoc excess/ purulent sputum or mucus plugs or hemoptysis or cp or chest tightness, subjective wheeze or overt sinus or hb symptoms.   Sleeping  without nocturnal  or early am exacerbation  of respiratory  c/o's or need for noct saba. Also denies any obvious fluctuation of symptoms with weather or environmental changes or other aggravating or alleviating factors except as outlined above   No unusual exposure hx or h/o childhood pna/ asthma or knowledge of premature birth.  Current Allergies, Complete Past Medical History, Past Surgical History, Family History, and Social History were reviewed in [facility] electronic medical record.  ROS  The following are not active complaints unless bolded Hoarseness, sore throat, dysphagia, dental problems, itching, sneezing,  nasal congestion or discharge of excess mucus or purulent secretions, ear ache,   fever, chills, sweats, unintended wt loss or wt gain, classically pleuritic or exertional cp,  orthopnea pnd or arm/hand swelling  or leg swelling, presyncope, palpitations, abdominal pain, anorexia, nausea, vomiting, diarrhea  or change in bowel habits or change in bladder habits, change in stools or change in urine, dysuria,  hematuria,  rash, arthralgias, visual complaints, headache, numbness, weakness or ataxia or problems with walking or coordination,  change in mood or  memory.        Current Meds  Medication Sig  . albuterol (PROVENTIL) (2.5 MG/3ML) 0.083% nebulizer solution Take 2.5 mg by nebulization every 6 (six) hours as needed for wheezing or shortness of breath.  . Albuterol Sulfate (PROAIR HFA IN) Inhale 2 puffs into the lungs every 4 (four) hours as needed.  . ALPRAZolam (XANAX) 0.5 MG tablet Take 0.5 mg by mouth at bedtime.  Marland Kitchen aspirin EC 81 MG tablet Take 81 mg by mouth daily.  Marland Kitchen atorvastatin (LIPITOR) 10 MG tablet Take 1 tablet by mouth daily.  . chlorthalidone (HYGROTON) 25 MG tablet  Take 25 mg by mouth daily.  Marland Kitchen diltiazem (CARDIZEM CD) 120 MG 24 hr capsule Take 1 capsule by mouth daily.  . famotidine (PEPCID) 40 MG tablet 1 daily after supper  . gabapentin (NEURONTIN) 300 MG capsule Take 300 mg by mouth 3 (three) times daily.   . isosorbide mononitrate (IMDUR) 30 MG 24 hr tablet Take 30 mg by mouth daily.  . metFORMIN (GLUCOPHAGE) 500 MG tablet Take 500 mg by mouth daily.  . multivitamin (ONE-A-DAY MEN'S) TABS Take 1 tablet by mouth daily.    Marland Kitchen omeprazole (PRILOSEC) 20 MG capsule Take 20 mg by mouth daily.  . OXYGEN 2lpm 24/7  . polyethylene glycol (MIRALAX / GLYCOLAX) packet Take 17 g by mouth daily as needed for mild constipation.  . potassium chloride SA (K-DUR,KLOR-CON) 20 MEQ tablet Take 20 mEq by mouth daily.   . simvastatin (ZOCOR) 20 MG tablet Take 20 mg by mouth at bedtime.   . sucralfate (CARAFATE) 1 g tablet Take 1 tablet by mouth 4 (four) times daily.  Marland Kitchen telmisartan (MICARDIS) 40 MG tablet Take 1 tablet (40 mg total) by mouth daily.               Past Medical History:  Diagnosis Date  . Chest pain 04/2011   Normal echocardiogram  . COPD (chronic obstructive pulmonary disease) ([place])   . Fasting hyperglycemia    Normal hemoglobin A1c of 6.0  . GERD (gastroesophageal reflux disease)   . Hyperlipemia   . Hypertension   . On home O2    2L N/C  . On home O2    at night  . Prediabetes 06/04/2015  . Tobacco abuse          Objective:     amb bm nad   Wt Readings from Last 3 Encounters:  10/31/18 112 lb (50.8 kg)  10/03/18 114 lb 6.4 oz (51.9 kg)  09/30/18 116 lb (52.6 kg)     Vital signs reviewed - Note on arrival 02 sats  100% on 2lpm       HEENT: nl  oropharynx. Nl external ear canals without cough reflex -  Mild bilateral non-specific turbinate edema     NECK :  without JVD/Nodes/TM/ nl carotid upstrokes bilaterally   LUNGS: no acc muscle use,  Mod barrel  contour chest wall with bilateral  Distant bs s audible wheeze and   without cough on insp or exp maneuver and mod  Hyperresonant  to  percussion bilaterally     CV:  RRR  no s3 or murmur or increase in P2, and no edema   ABD:  soft and nontender with pos mid insp Hoover's  in the supine position. No bruits or organomegaly appreciated, bowel sounds nl  MS:     ext warm without deformities, calf tenderness, cyanosis or clubbing No obvious joint restrictions   SKIN: warm and dry without lesions    NEURO:  alert, approp, nl sensorium with  no motor or cerebellar deficits apparent.                    Assessment

## 2018-11-01 DIAGNOSIS — J449 Chronic obstructive pulmonary disease, unspecified: Secondary | ICD-10-CM | POA: Diagnosis not present

## 2018-11-01 DIAGNOSIS — J9611 Chronic respiratory failure with hypoxia: Secondary | ICD-10-CM | POA: Diagnosis not present

## 2018-11-01 DIAGNOSIS — Z0000 Encounter for general adult medical examination without abnormal findings: Secondary | ICD-10-CM | POA: Diagnosis not present

## 2018-11-01 DIAGNOSIS — J440 Chronic obstructive pulmonary disease with acute lower respiratory infection: Secondary | ICD-10-CM | POA: Diagnosis not present

## 2018-11-01 DIAGNOSIS — I10 Essential (primary) hypertension: Secondary | ICD-10-CM | POA: Diagnosis not present

## 2018-11-01 DIAGNOSIS — E785 Hyperlipidemia, unspecified: Secondary | ICD-10-CM | POA: Diagnosis not present

## 2018-11-01 DIAGNOSIS — E119 Type 2 diabetes mellitus without complications: Secondary | ICD-10-CM | POA: Diagnosis not present

## 2018-11-01 DIAGNOSIS — K210 Gastro-esophageal reflux disease with esophagitis: Secondary | ICD-10-CM | POA: Diagnosis not present

## 2018-11-04 ENCOUNTER — Encounter: Payer: Self-pay | Admitting: Internal Medicine

## 2018-11-04 DIAGNOSIS — J449 Chronic obstructive pulmonary disease, unspecified: Secondary | ICD-10-CM | POA: Insufficient documentation

## 2018-11-04 NOTE — Assessment & Plan Note (Signed)
02 2lpm 24/7 as of 10/03/2018   Adequate control on present rx, reviewed in detail with pt > no change in rx needed     I had an extended discussion with the patient reviewing all relevant studies completed to date and  lasting 15 to 20 minutes of a 25 minute visit    Each maintenance medication was reviewed in detail including most importantly the difference between maintenance and prns and under what circumstances the prns are to be triggered using an action plan format that is not reflected in the computer generated alphabetically organized AVS.     Please see AVS for specific instructions unique to this visit that I personally wrote and verbalized to the the pt in detail and then reviewed with pt  by my nurse highlighting any  changes in therapy recommended at today's visit to their plan of care.

## 2018-11-04 NOTE — Assessment & Plan Note (Addendum)
Off acei 10/03/18 due to atypical copd   overtreated at present on micardis 40 mg > try 20 mg daily    NB : Although even in retrospect it may not be clear the ACEi contributed to the pt's symptoms,  Pt improved off them and adding them back at this point or in the future would risk confusion in interpretation of non-specific respiratory symptoms to which this patient is prone  ie  Better not to muddy the waters here.    The sensation of dry throat is not typical of acei and may be from using sama in neb so may need to cut back this component if persists

## 2018-11-04 NOTE — Assessment & Plan Note (Signed)
Quit smoking  05/2013 and steroid dep since 2019  - 10/03/2018  After extensive coaching inhaler device,  effectiveness =    0% (can't use them) - d/c all inhalers x for neb 10/03/2018 and d/c ACEi as well   Seems to be doing better and since can't use hfa at all rec stay on duoneb but wean pred down and if can't get him off it consider adding budesonide to neb.  The goal with a chronic steroid dependent illness is always arriving at the lowest effective dose that controls the disease/symptoms and not accepting a set "formula" which is based on statistics or guidelines that don't always take into account patient  variability or the natural hx of the dz in every individual patient, which may well vary over time.  For now therefore I recommend the patient maintain  Ceiling of 20 mg and floor of 5 mg

## 2018-11-16 ENCOUNTER — Telehealth: Payer: Self-pay | Admitting: Internal Medicine

## 2018-11-16 MED ORDER — ALBUTEROL SULFATE HFA 108 (90 BASE) MCG/ACT IN AERS: 1.0000 | INHALATION_SPRAY | RESPIRATORY_TRACT | 5 refills | Status: AC | PRN

## 2018-11-16 NOTE — Telephone Encounter (Signed)
Called and spoke with pt's wife Doroteo Bradford verifying med and pharmacy that the med was needing to be sent to. I have sent refill of pt's proair inhaler to preferred pharmacy. Nothing further needed.

## 2018-11-28 ENCOUNTER — Ambulatory Visit: Payer: Medicare Other | Admitting: Internal Medicine

## 2018-11-28 ENCOUNTER — Encounter: Payer: Self-pay | Admitting: Internal Medicine

## 2018-11-28 ENCOUNTER — Other Ambulatory Visit: Payer: Self-pay

## 2018-11-28 DIAGNOSIS — I10 Essential (primary) hypertension: Secondary | ICD-10-CM

## 2018-11-28 DIAGNOSIS — J9611 Chronic respiratory failure with hypoxia: Secondary | ICD-10-CM | POA: Diagnosis not present

## 2018-11-28 DIAGNOSIS — J449 Chronic obstructive pulmonary disease, unspecified: Secondary | ICD-10-CM

## 2018-11-28 NOTE — Progress Notes (Signed)
Carolyne FiscalMichael L Digioia, male    DOB: Sep 03, 1950,    MRN: [id number]   Brief patient profile:  [age] yobm quit smoking 05/2013 with onset of doe around 2013 and at the point he quit smoking already on neb qid and then placed on variable inhalers and 02 but does not recall ever  pfts  Seen in ER x 6 x in 2020 so referred to pulmonary clinic 10/03/2018 by Dr   Kreg ShropshireHasanej steroid dep x 2019.    History of Present Illness  10/03/2018  Pulmonary/ 1st office eval/Tonique Mendonca  On ACEi/dpi's including spiriva  Chief Complaint  Patient presents with  . Consult    Consult WU:JWJXre:COPD. States his breathing has its good and bad days. States he has been on oxygen for about 2 years and also uses at night.   Dyspnea:  Much better p ER rx x 6  But only temporarily while  On pred 40 mg then back to 5 mg and flares w/in a week  Cough: dry/choking sensation/ worse when use inhalers esp dpi's Sleep: prone / bed is flat SABA use: multiple and not helpful  02 2lpm hs  rec Take omeprazole 20 mg x 2  About 30-60 before your first meal Take famotidine 40 mg after supper or before bed  Stop all inhalers and just use the nebulizer = duoneb ipratropium /albuterol breafast lunch supper and bedtime automatically. Only use your albuterol nebulizer as a rescue medication  Stop benazapril and start  micardis (telmisartan) 40 mg one daily in its place and call me if there is a problem  Prednisone 20 mg this evening,  Then 20 mg x 2 days, 10 mg x 2 days then stay on 5 mg daily but if get worse go back to 20 mg each am and start over   10/31/2018  f/u ov/Lyndall Bellot re:  Copd/ ? acei contributing to some of his symptoms?  Chief Complaint  Patient presents with  . Follow-up    Breathing is doing well today. He states his throat feels dry.    Dyspnea:  Walked to stop sign and back s stopping x two = 250 ft? Cough: better / no longer choking but throat still doesn't feel quite right  Sleeping: ok p xanax  SABA use: avg 2-3 daily  02:  2lpm NP 24/7   rec Reduce your telmisartan to 40 mg one half a pill daily  Duoneb=  ipratropium /albuterol breafast lunch supper and bedtime automatically. As a backup >>> Only use your albuterol nebulizer as a rescue medication to be used if you can't catch your breath by resting or doing a relaxed purse lip breathing pattern.  - The less you use it, the better it will work when you need it. - Ok to use up to  every 3  hours if you must but call for immediate appointment if use goes up over your usual need or resume prednisone at 20 mg   Prednisone is 20 mg per day  Until  (5mg  x 4 pills) until better then taper to 5 mg  - if worse go back up  Please schedule a follow up office visit in 4 weeks, sooner if needed  with all medications /inhalers/ solutions in hand so we can verify exactly what you are taking. This includes all medications from all doctors and over the counters Add: consider adding budesonide to neb next ov if can't reduce      11/28/2018  f/u ov/Keymiah Lyles re: COPD / f/u  off acei improved overall - using duoneb qid as   maint rx plus saba hfa "when over does it" then makes him shaky   Chief Complaint  Patient presents with  . Follow-up    Breathing is "so so". He is c/o shakiness and feels like his legs give out too easily. He is using his albuterol inhaler 2 x daily and Duoneb 4 x daily.   Dyspnea:  Walks to stop sign and back on 2lpm Cough: no Sleeping: ok prone flat bed  SABA use: as above 02: 2l  Lpm24/7    No obvious day to day or daytime variability or assoc excess/ purulent sputum or mucus plugs or hemoptysis or cp or chest tightness, subjective wheeze or overt sinus or hb symptoms.   Sleeping ok as above  without nocturnal  or early am exacerbation  of respiratory  c/o's or need for noct saba. Also denies any obvious fluctuation of symptoms with weather or environmental changes or other aggravating or alleviating factors except as outlined above   No unusual exposure hx or h/o  childhood pna/ asthma or knowledge of premature birth.  Current Allergies, Complete Past Medical History, Past Surgical History, Family History, and Social History were reviewed in [facility] electronic medical record.  ROS  The following are not active complaints unless bolded Hoarseness, sore throat, dysphagia, dental problems, itching, sneezing,  nasal congestion or discharge of excess mucus or purulent secretions, ear ache,   fever, chills, sweats, unintended wt loss or wt gain, classically pleuritic or exertional cp,  orthopnea pnd or arm/hand swelling  or leg swelling, presyncope, palpitations, abdominal pain, anorexia, nausea, vomiting, diarrhea  or change in bowel habits or change in bladder habits, change in stools or change in urine, dysuria, hematuria,  rash, arthralgias, visual complaints, headache, numbness, weakness or ataxia or problems with walking or coordination,  change in mood or  memory.        Current Meds  Medication Sig  . albuterol (PROAIR HFA) 108 (90 Base) MCG/ACT inhaler Inhale 1-2 puffs into the lungs every 4 (four) hours as needed.  . ALPRAZolam (XANAX) 0.5 MG tablet Take 0.5 mg by mouth at bedtime.  Marland Kitchen. aspirin EC 81 MG tablet Take 81 mg by mouth daily.  . chlorthalidone (HYGROTON) 25 MG tablet Take 25 mg by mouth daily.  Marland Kitchen. diltiazem (CARDIZEM CD) 120 MG 24 hr capsule Take 1 capsule by mouth daily.  . famotidine (PEPCID) 40 MG tablet 1 daily after supper  . gabapentin (NEURONTIN) 300 MG capsule Take 300 mg by mouth 3 (three) times daily.   Marland Kitchen. ipratropium-albuterol (DUONEB) 0.5-2.5 (3) MG/3ML SOLN Take 3 mLs by nebulization every 6 (six) hours as needed.  . isosorbide mononitrate (IMDUR) 30 MG 24 hr tablet Take 30 mg by mouth daily.  . metFORMIN (GLUCOPHAGE) 500 MG tablet Take 500 mg by mouth daily.  . multivitamin (ONE-A-DAY MEN'S) TABS Take 1 tablet by mouth daily.    Marland Kitchen. omeprazole (PRILOSEC) 20 MG capsule Take 20 mg by mouth daily.  . OXYGEN 2lpm 24/7  .  potassium chloride SA (K-DUR,KLOR-CON) 20 MEQ tablet Take 20 mEq by mouth daily.   . predniSONE (DELTASONE) 5 MG tablet 4 daily with bfast until better then taper to one daily  . simvastatin (ZOCOR) 20 MG tablet Take 20 mg by mouth at bedtime.   . sucralfate (CARAFATE) 1 g tablet Take 1 tablet by mouth 4 (four) times daily.  Marland Kitchen. telmisartan (MICARDIS) 40 MG tablet Take 1 tablet (  40 mg total) by mouth daily. (Patient taking differently: Take 20 mg by mouth daily. )               Past Medical History:  Diagnosis Date  . Chest pain 04/2011   Normal echocardiogram  . COPD (chronic obstructive pulmonary disease) ([place])   . Fasting hyperglycemia    Normal hemoglobin A1c of 6.0  . GERD (gastroesophageal reflux disease)   . Hyperlipemia   . Hypertension   . On home O2    2L N/C  . On home O2    at night  . Prediabetes 06/04/2015  . Tobacco abuse          Objective:     amb thin bm and   11/28/2018        116   10/31/18 112 lb (50.8 kg)  10/03/18 114 lb 6.4 oz (51.9 kg)  09/30/18 116 lb (52.6 kg)     Vital signs reviewed - Note on arrival 02 sats  100% on 2lpm pulsed       HEENT: nl dentition / oropharynx. Nl external ear canals without cough reflex -  Mild bilateral non-specific turbinate edema     NECK :  without JVD/Nodes/TM/ nl carotid upstrokes bilaterally   LUNGS: no acc muscle use,  Mod barrel  contour chest wall with bilateral  Distant bs s audible wheeze and  without cough on insp or exp maneuver and mod  Hyperresonant  to  percussion bilaterally     CV:  RRR  no s3 or murmur or increase in P2, and no edema   ABD:  soft and nontender with pos mid insp Hoover's  in the supine position. No bruits or organomegaly appreciated, bowel sounds nl  MS:     ext warm without deformities, calf tenderness, cyanosis or clubbing No obvious joint restrictions   SKIN: warm and dry without lesions    NEURO:  alert, approp, nl sensorium with  no motor or cerebellar deficits  apparent.  Resting tremor noted                   Assessment

## 2018-11-28 NOTE — Patient Instructions (Signed)
Change chlorthalidone to just take a half if legs swell    No other changes needed   Please schedule a follow up visit in 3 months but call sooner if needed

## 2018-12-01 ENCOUNTER — Encounter: Payer: Self-pay | Admitting: Internal Medicine

## 2018-12-01 NOTE — Assessment & Plan Note (Signed)
Quit smoking  05/2013 and steroid dep since 2019  - 10/03/2018  After extensive coaching inhaler device,  effectiveness =    0% (can't use them) - d/c all inhalers x for neb 10/03/2018 and d/c ACEi as well   Adequate control on present rx, reviewed in detail with pt > no change in rx needed  Though ? Whether the hfa alb is being used correctly or actually helping at all   I spent extra time with pt today reviewing appropriate use of albuterol for prn use on exertion with the following points: 1) saba is for relief of sob that does not improve by walking a slower pace or resting but rather if the pt does not improve after trying this first. 2) If the pt is convinced, as many are, that saba helps recover from activity faster then it's easy to tell if this is the case by re-challenging : ie stop, take the inhaler, then p 5 minutes try the exact same activity (intensity of workload) that just caused the symptoms and see if they are substantially diminished or not after saba 3) if there is an activity that reproducibly causes the symptoms, try the saba 15 min before the activity on alternate days   If in fact the saba really does help, then fine to continue to use it prn but advised may need to look closer at the maintenance regimen being used to achieve better control of airways disease with exertion.    Will continue pred also with "floor of pred = 5 mg daily .

## 2018-12-01 NOTE — Assessment & Plan Note (Signed)
02 2lpm 24/7 as of 10/03/2018   Adequate control on present rx, reviewed in detail with pt > no change in rx needed

## 2018-12-02 DIAGNOSIS — J449 Chronic obstructive pulmonary disease, unspecified: Secondary | ICD-10-CM | POA: Diagnosis not present

## 2018-12-02 DIAGNOSIS — J9611 Chronic respiratory failure with hypoxia: Secondary | ICD-10-CM | POA: Diagnosis not present

## 2018-12-03 DIAGNOSIS — J440 Chronic obstructive pulmonary disease with acute lower respiratory infection: Secondary | ICD-10-CM | POA: Diagnosis not present

## 2018-12-09 ENCOUNTER — Encounter (HOSPITAL_COMMUNITY): Payer: Self-pay

## 2018-12-09 ENCOUNTER — Other Ambulatory Visit: Payer: Self-pay

## 2018-12-09 DIAGNOSIS — R109 Unspecified abdominal pain: Secondary | ICD-10-CM | POA: Diagnosis not present

## 2018-12-09 DIAGNOSIS — I10 Essential (primary) hypertension: Secondary | ICD-10-CM | POA: Insufficient documentation

## 2018-12-09 DIAGNOSIS — J449 Chronic obstructive pulmonary disease, unspecified: Secondary | ICD-10-CM | POA: Insufficient documentation

## 2018-12-09 DIAGNOSIS — Z79899 Other long term (current) drug therapy: Secondary | ICD-10-CM | POA: Insufficient documentation

## 2018-12-09 DIAGNOSIS — Z87891 Personal history of nicotine dependence: Secondary | ICD-10-CM | POA: Diagnosis not present

## 2018-12-09 DIAGNOSIS — R1010 Upper abdominal pain, unspecified: Secondary | ICD-10-CM | POA: Insufficient documentation

## 2018-12-09 DIAGNOSIS — K5900 Constipation, unspecified: Secondary | ICD-10-CM | POA: Diagnosis not present

## 2018-12-09 DIAGNOSIS — R0602 Shortness of breath: Secondary | ICD-10-CM | POA: Diagnosis not present

## 2018-12-09 DIAGNOSIS — R140 Abdominal distension (gaseous): Secondary | ICD-10-CM | POA: Diagnosis not present

## 2018-12-09 NOTE — ED Triage Notes (Signed)
Pt c/o abdominal pain starting today. Michela Pitcher it hurts when he moves a certain way. No n/v/d

## 2018-12-10 ENCOUNTER — Emergency Department (HOSPITAL_COMMUNITY): Payer: Medicare Other

## 2018-12-10 ENCOUNTER — Emergency Department (HOSPITAL_COMMUNITY)
Admission: EM | Admit: 2018-12-10 | Discharge: 2018-12-10 | Disposition: A | Discharge status: Home / Self Care | Payer: Medicare Other | Attending: Emergency Medicine | Admitting: Emergency Medicine

## 2018-12-10 DIAGNOSIS — R140 Abdominal distension (gaseous): Secondary | ICD-10-CM | POA: Diagnosis not present

## 2018-12-10 DIAGNOSIS — R109 Unspecified abdominal pain: Secondary | ICD-10-CM | POA: Diagnosis not present

## 2018-12-10 DIAGNOSIS — K5900 Constipation, unspecified: Secondary | ICD-10-CM

## 2018-12-10 NOTE — ED Provider Notes (Signed)
[facility] EMERGENCY DEPARTMENT Provider Note   CSN: [id number] Arrival date & time: 12/09/18  2119  Time seen 12:36 AM   History   Chief Complaint Chief Complaint  Patient presents with  . Abdominal Pain    HPI Carolyne FiscalMichael L Nidiffer is a 68 y.o. male.     HPI  patient states his evening before dark he started having some upper abdominal pain that he describes "like a catch in your back".  He states movement makes it worse.  He has a hard time telling me whether the pain is constant or if it comes and goes.  But at the time of my exam he states he was having no discomfort.  He states he had something similar before when he was constipated however he states he had a good bowel movement today but states it was soft.  He took Colace for couple days earlier in the week but not the past couple of days.  He states he is on medication that makes him constipated but denies being on any pain medication other than gabapentin.  He denies nausea, vomiting, or fever.  He states he does feel bloated.  He states he is chronically short of breath and he is not more short of breath than usual.  He is on oxygen 2 L/min nasal cannula at home all the time.  He denies cough or fever.  Patient states he has abdominal surgery in the past which was a appendectomy when he was a teenager.  PCP Toma DeitersHasanaj, Xaje A, MD   Past Medical History:  Diagnosis Date  . Chest pain 04/2011   Normal echocardiogram  . COPD (chronic obstructive pulmonary disease) (HCC)   . Fasting hyperglycemia    Normal hemoglobin A1c of 6.0  . GERD (gastroesophageal reflux disease)   . Hyperlipemia   . Hypertension   . On home O2    2L N/C  . On home O2    at night  . Prediabetes 06/04/2015  . Tobacco abuse     Patient Active Problem List   Diagnosis Date Noted  . COPD  GOLD ?  11/04/2018  . Chronic respiratory failure with hypoxia (HCC) 10/04/2018  . Right middle lobe pneumonia (HCC) 11/07/2016  . Acute on chronic respiratory  failure with hypoxia (HCC) 11/07/2016  . Prediabetes 06/04/2015  . Hyperglycemia, drug-induced 06/03/2015  . Essential hypertension 06/03/2015  . COPD exacerbation (HCC) 06/02/2015  . Hyperlipidemia 07/20/2011  . Fasting hyperglycemia   . Tobacco abuse   . Chest pain 04/02/2011    Past Surgical History:  Procedure Laterality Date  . APPENDECTOMY    . APPENDECTOMY    . LEFT HEART CATHETERIZATION WITH CORONARY ANGIOGRAM N/A 05/17/2011   Procedure: LEFT HEART CATHETERIZATION WITH CORONARY ANGIOGRAM;  Surgeon: Tonny BollmanMichael Cooper, MD;  [facility];  Service: Cardiovascular;  Laterality: N/A;        Home Medications    Prior to Admission medications   Medication Sig Start Date End Date Taking? Authorizing Provider  albuterol (PROAIR HFA) 108 (90 Base) MCG/ACT inhaler Inhale 1-2 puffs into the lungs every 4 (four) hours as needed. 11/16/18   Nyoka CowdenWert, Ephrem B, MD  albuterol (PROVENTIL) (2.5 MG/3ML) 0.083% nebulizer solution Take 2.5 mg by nebulization every 6 (six) hours as needed for wheezing or shortness of breath.    [provider]  ALPRAZolam Prudy Feeler(XANAX) 0.5 MG tablet Take 0.5 mg by mouth at bedtime.    [provider]  aspirin EC 81 MG tablet  Take 81 mg by mouth daily.    [provider]  chlorthalidone (HYGROTON) 25 MG tablet Take 25 mg by mouth daily.    [provider]  diltiazem (CARDIZEM CD) 120 MG 24 hr capsule Take 1 capsule by mouth daily. 08/01/18   [provider]  famotidine (PEPCID) 40 MG tablet 1 daily after supper 10/03/18   Tanda Rockers, MD  gabapentin (NEURONTIN) 300 MG capsule Take 300 mg by mouth 3 (three) times daily.  03/22/13   [provider]  ipratropium-albuterol (DUONEB) 0.5-2.5 (3) MG/3ML SOLN Take 3 mLs by nebulization every 6 (six) hours as needed.    [provider]  isosorbide mononitrate (IMDUR) 30 MG 24 hr tablet Take 30 mg by mouth daily. 03/22/14   [provider]  metFORMIN  (GLUCOPHAGE) 500 MG tablet Take 500 mg by mouth daily.    [provider]  multivitamin (ONE-A-DAY MEN'S) TABS Take 1 tablet by mouth daily.      [provider]  omeprazole (PRILOSEC) 20 MG capsule Take 20 mg by mouth daily.    [provider]  OXYGEN 2lpm 24/7    [provider]  potassium chloride SA (K-DUR,KLOR-CON) 20 MEQ tablet Take 20 mEq by mouth daily.  05/12/14   [provider]  predniSONE (DELTASONE) 5 MG tablet 4 daily with bfast until better then taper to one daily 10/31/18   Tanda Rockers, MD  simvastatin (ZOCOR) 20 MG tablet Take 20 mg by mouth at bedtime.  03/22/14   [provider]  sucralfate (CARAFATE) 1 g tablet Take 1 tablet by mouth 4 (four) times daily. 07/19/18   [provider]  telmisartan (MICARDIS) 40 MG tablet Take 1 tablet (40 mg total) by mouth daily. Patient taking differently: Take 20 mg by mouth daily.  10/03/18   Tanda Rockers, MD    Family History Family History  Problem Relation Age of Onset  . Emphysema Father     Social History Social History   Tobacco Use  . Smoking status: Former Smoker    Packs/day: 1.00    Years: 35.00    Pack years: 35.00    Types: Cigarettes    Quit date: 05/09/2013    Years since quitting: 5.5  . Smokeless tobacco: Never Used  Substance Use Topics  . Alcohol use: No  . Drug use: No  Lives at home Lives with spouse Wears oxygen 24/7   Allergies   Codeine   Review of Systems Review of Systems  All other systems reviewed and are negative.    Physical Exam Updated Vital Signs BP (!) 141/89   Pulse 82   Temp 97.7 F (36.5 C) (Oral)   Resp 20   Ht 5\' 8"  (1.727 m)   Wt 52.8 kg   SpO2 100%   BMI 17.70 kg/m   Physical Exam Vitals signs and nursing note reviewed.  Constitutional:      Comments: Frail elderly male  HENT:     Head: Normocephalic and atraumatic.     Nose: Nose normal.     Mouth/Throat:     Mouth: Mucous membranes are dry.   Eyes:     Extraocular Movements: Extraocular movements intact.     Conjunctiva/sclera: Conjunctivae normal.     Pupils: Pupils are equal, round, and reactive to light.  Neck:     Musculoskeletal: Normal range of motion.  Cardiovascular:     Rate and Rhythm: Normal rate and regular rhythm.  Pulses: Normal pulses.     Heart sounds: Normal heart sounds.  Pulmonary:     Effort: Tachypnea present.     Comments: Patient has mild shortness of breath but he states this is chronic Abdominal:     General: There is distension.     Palpations: Abdomen is soft.     Tenderness: There is no abdominal tenderness.  Musculoskeletal: Normal range of motion.  Skin:    General: Skin is warm and dry.  Neurological:     General: No focal deficit present.     Mental Status: He is alert and oriented to person, place, and time.     Cranial Nerves: No cranial nerve deficit.  Psychiatric:        Mood and Affect: Mood normal.        Behavior: Behavior normal.        Thought Content: Thought content normal.      ED Treatments / Results  Labs (all labs ordered are listed, but only abnormal results are displayed) Labs Reviewed - No data to display  EKG None  Radiology Dg Abd 2 Views  Result Date: 12/10/2018 CLINICAL DATA:  [age] year old male abdominal pain and distension. EXAM: ABDOMEN - 2 VIEW COMPARISON:  CT Abdomen and Pelvis [facility]/22/2013 [facility]/2003. FINDINGS: Upright and supine views. Possible pulmonary hyperinflation but otherwise negative lung bases. No pneumoperitoneum. Non obstructed bowel gas pattern. Retained stool throughout the colon similar to the prior CT. Chronic right hemipelvis phleboliths. Abdominal and pelvic visceral contours appear stable. No acute osseous abnormality identified. IMPRESSION: Nonobstructed bowel-gas pattern with retained stool.  No free air. Electronically Signed   By: Odessa FlemingH  Hall M.D.   On: 12/10/2018 01:26    Procedures  Procedures (including critical care time)  Medications Ordered in ED Medications - No data to display   Initial Impression / Assessment and Plan / ED Course  I have reviewed the triage vital signs and the nursing notes.  Pertinent labs & imaging results that were available during my care of the patient were reviewed by me and considered in my medical decision making (see chart for details).       I reviewed patient's x-ray he does have a lot of stool throughout his colon.  Patient was discharged home with instructions on how to take care of his constipation.  At this point I did not feel any further testing was indicated.  Review of the [place] database shows patient gets #90 alprazolam 0.5 mg tablets about every 2 months, last filled July 30.  These are prescribed by his PCP.  Otherwise there is no other controlled drugs listed.  Final Clinical Impressions(s) / ED Diagnoses   Final diagnoses:  Abdominal pain, unspecified abdominal location  Constipation, unspecified constipation type    ED Discharge Orders    None    OTC miralax  Plan discharge  Devoria AlbeIva Desirai Traxler, MD, Concha PyoFACEP    Morene Cecilio, MD 12/10/18 0530

## 2018-12-10 NOTE — Discharge Instructions (Addendum)
Get miralax and put one dose or 17 g in 8 ounces of water,  take 1 dose every 30 minutes for 2-3 hours or until you  get good results and then once or twice daily to prevent constipation.  Recheck if you get fever, vomiting, or worsening pain.

## 2018-12-11 ENCOUNTER — Other Ambulatory Visit: Payer: Self-pay

## 2018-12-11 NOTE — Patient Outreach (Signed)
[facility]) Care Management  12/11/2018  CALVIN JABLONOWSKI Nov 17, 1950 [id number]   Medication Adherence call to Mr. Fuquan Wilson Hippa Identifiers Verify spoke with patient he is past due on Telmisartan 40 mg patient explain he is taking 1 tablet daily patient ask if we can call the pharmacy an order this medication pharmacy will have it ready for patient to pick up. Mr. Hanway is showing past due under [place].  [place] Management Direct Dial [phone]  Fax [phone] [email or web address]

## 2018-12-13 DIAGNOSIS — S32050A Wedge compression fracture of fifth lumbar vertebra, initial encounter for closed fracture: Secondary | ICD-10-CM | POA: Diagnosis not present

## 2018-12-13 DIAGNOSIS — K5900 Constipation, unspecified: Secondary | ICD-10-CM | POA: Diagnosis not present

## 2018-12-13 DIAGNOSIS — M419 Scoliosis, unspecified: Secondary | ICD-10-CM | POA: Diagnosis not present

## 2018-12-13 DIAGNOSIS — M8588 Other specified disorders of bone density and structure, other site: Secondary | ICD-10-CM | POA: Diagnosis not present

## 2018-12-13 DIAGNOSIS — J440 Chronic obstructive pulmonary disease with acute lower respiratory infection: Secondary | ICD-10-CM | POA: Diagnosis not present

## 2018-12-13 DIAGNOSIS — M47816 Spondylosis without myelopathy or radiculopathy, lumbar region: Secondary | ICD-10-CM | POA: Diagnosis not present

## 2018-12-13 DIAGNOSIS — M791 Myalgia, unspecified site: Secondary | ICD-10-CM | POA: Diagnosis not present

## 2018-12-13 DIAGNOSIS — M546 Pain in thoracic spine: Secondary | ICD-10-CM | POA: Diagnosis not present

## 2018-12-17 DIAGNOSIS — M791 Myalgia, unspecified site: Secondary | ICD-10-CM | POA: Diagnosis not present

## 2018-12-17 DIAGNOSIS — Z79891 Long term (current) use of opiate analgesic: Secondary | ICD-10-CM | POA: Diagnosis not present

## 2018-12-17 DIAGNOSIS — R2689 Other abnormalities of gait and mobility: Secondary | ICD-10-CM | POA: Diagnosis not present

## 2018-12-17 DIAGNOSIS — Z7982 Long term (current) use of aspirin: Secondary | ICD-10-CM | POA: Diagnosis not present

## 2018-12-17 DIAGNOSIS — Z7951 Long term (current) use of inhaled steroids: Secondary | ICD-10-CM | POA: Diagnosis not present

## 2018-12-17 DIAGNOSIS — E119 Type 2 diabetes mellitus without complications: Secondary | ICD-10-CM | POA: Diagnosis not present

## 2018-12-17 DIAGNOSIS — K5900 Constipation, unspecified: Secondary | ICD-10-CM | POA: Diagnosis not present

## 2018-12-17 DIAGNOSIS — Z9981 Dependence on supplemental oxygen: Secondary | ICD-10-CM | POA: Diagnosis not present

## 2018-12-17 DIAGNOSIS — Z7984 Long term (current) use of oral hypoglycemic drugs: Secondary | ICD-10-CM | POA: Diagnosis not present

## 2018-12-20 DIAGNOSIS — K5900 Constipation, unspecified: Secondary | ICD-10-CM | POA: Diagnosis not present

## 2018-12-20 DIAGNOSIS — Z9981 Dependence on supplemental oxygen: Secondary | ICD-10-CM | POA: Diagnosis not present

## 2018-12-20 DIAGNOSIS — R2689 Other abnormalities of gait and mobility: Secondary | ICD-10-CM | POA: Diagnosis not present

## 2018-12-20 DIAGNOSIS — Z7951 Long term (current) use of inhaled steroids: Secondary | ICD-10-CM | POA: Diagnosis not present

## 2018-12-20 DIAGNOSIS — Z7984 Long term (current) use of oral hypoglycemic drugs: Secondary | ICD-10-CM | POA: Diagnosis not present

## 2018-12-20 DIAGNOSIS — M791 Myalgia, unspecified site: Secondary | ICD-10-CM | POA: Diagnosis not present

## 2018-12-20 DIAGNOSIS — M810 Age-related osteoporosis without current pathological fracture: Secondary | ICD-10-CM | POA: Diagnosis not present

## 2018-12-20 DIAGNOSIS — Z7982 Long term (current) use of aspirin: Secondary | ICD-10-CM | POA: Diagnosis not present

## 2018-12-20 DIAGNOSIS — M8588 Other specified disorders of bone density and structure, other site: Secondary | ICD-10-CM | POA: Diagnosis not present

## 2018-12-20 DIAGNOSIS — Z79891 Long term (current) use of opiate analgesic: Secondary | ICD-10-CM | POA: Diagnosis not present

## 2018-12-21 ENCOUNTER — Telehealth: Payer: Self-pay | Admitting: Internal Medicine

## 2018-12-21 NOTE — Telephone Encounter (Signed)
Pt returning your call

## 2018-12-21 NOTE — Telephone Encounter (Signed)
Attempt to return call. Left voicemail to call us back.

## 2018-12-21 NOTE — Telephone Encounter (Signed)
Returned call to spouse, Doroteo Bradford .  Reports patient's ankles are swelling since Sunday.  Home care nurse asked wife to call us today.  Asked wife to describe his breathing - is he wheezing, congested, etc. And she states he 'sounds like a lion' and says it just sounds like noises such as groaning or moaning at times.  No cough but complains of SOB.   Patient is able to sleep on one pillow at night without difficulty.   Patient has been using nebulizer and albuterol inhaler but does not see any significant change.  No fluid pills at this time.  Is taking his daily prednisone but has not decreased hygroton dose as previously advised by Dr. Melvyn Novas for swelling. Denies any other type of symptoms or concern.  Primary Pulmonologist: Wert Last office visit and with whom: 11/28/18 What do we see them for (pulmonary problems):COPD Last OV assessment/plan: Quit smoking  05/2013 and steroid dep since 2019  - 10/03/2018  After extensive coaching inhaler device,  effectiveness =    0% (can't use them) - d/c all inhalers x for neb 10/03/2018 and d/c ACEi as well   Adequate control on present rx, reviewed in detail with pt > no change in rx needed  Though ? Whether the hfa alb is being used correctly or actually helping at all   I spent extra time with pt today reviewing appropriate use of albuterol for prn use on exertion with the following points: 1) saba is for relief of sob that does not improve by walking a slower pace or resting but rather if the pt does not improve after trying this first. 2) If the pt is convinced, as many are, that saba helps recover from activity faster then it's easy to tell if this is the case by re-challenging : ie stop, take the inhaler, then p 5 minutes try the exact same activity (intensity of workload) that just caused the symptoms and see if they are substantially diminished or not after saba 3) if there is an activity that reproducibly causes the symptoms, try the saba 15 min before  the activity on alternate days   If in fact the saba really does help, then fine to continue to use it prn but advised may need to look closer at the maintenance regimen being used to achieve better control of airways disease with exertion.   Change chlorthalidone to just take a half if legs swell   Was appointment offered to patient (explain)?  No  Reason for call: SOB and swelling of ankles  Will route to Dr. Melvyn Novas for recommendations

## 2018-12-21 NOTE — Telephone Encounter (Signed)
Called spoke with patient's wife Jefferson. She states patient hasn't been taking medication per Dr. Melvyn Novas. So instructed wife to have patient take 1 whole pill and then cut in half when legs are swollen. If any issues to call our office. Also instructed if not improving head to the ER with all medications. Wife verbalized understanding Nothing further needed at this time.

## 2018-12-21 NOTE — Telephone Encounter (Signed)
If not better with nebulizer this probably not primarily a lung problem but may be related to fluid   My intention was for them to use hygroton as needed anytime leg swelling noted but just ot try to get by with the one half dose if possible  If they are swollen now needs to double whatever dose he's been using and then cut back in half once better - if taking none, take one ,  If taking 1/2, take one   If not improving will need to go to ER with all active meds in hand but nothing else to offer this afternoon and may not be able to wait until first of the week to be seen but if so let him see one of our NPs with all meds in hand using a trust but verify approach to confirm accurate Medication  Reconciliation The principal here is that until we are certain that the  patients are doing what we've asked, it makes no sense to ask them to do more.

## 2018-12-24 DIAGNOSIS — M810 Age-related osteoporosis without current pathological fracture: Secondary | ICD-10-CM | POA: Diagnosis not present

## 2018-12-25 DIAGNOSIS — R2689 Other abnormalities of gait and mobility: Secondary | ICD-10-CM | POA: Diagnosis not present

## 2018-12-25 DIAGNOSIS — Z7984 Long term (current) use of oral hypoglycemic drugs: Secondary | ICD-10-CM | POA: Diagnosis not present

## 2018-12-25 DIAGNOSIS — Z7982 Long term (current) use of aspirin: Secondary | ICD-10-CM | POA: Diagnosis not present

## 2018-12-25 DIAGNOSIS — Z7951 Long term (current) use of inhaled steroids: Secondary | ICD-10-CM | POA: Diagnosis not present

## 2018-12-25 DIAGNOSIS — Z79891 Long term (current) use of opiate analgesic: Secondary | ICD-10-CM | POA: Diagnosis not present

## 2018-12-25 DIAGNOSIS — M791 Myalgia, unspecified site: Secondary | ICD-10-CM | POA: Diagnosis not present

## 2018-12-25 DIAGNOSIS — K5900 Constipation, unspecified: Secondary | ICD-10-CM | POA: Diagnosis not present

## 2018-12-25 DIAGNOSIS — Z9981 Dependence on supplemental oxygen: Secondary | ICD-10-CM | POA: Diagnosis not present

## 2018-12-27 DIAGNOSIS — Z7982 Long term (current) use of aspirin: Secondary | ICD-10-CM | POA: Diagnosis not present

## 2018-12-27 DIAGNOSIS — Z9981 Dependence on supplemental oxygen: Secondary | ICD-10-CM | POA: Diagnosis not present

## 2018-12-27 DIAGNOSIS — Z79891 Long term (current) use of opiate analgesic: Secondary | ICD-10-CM | POA: Diagnosis not present

## 2018-12-27 DIAGNOSIS — Z7951 Long term (current) use of inhaled steroids: Secondary | ICD-10-CM | POA: Diagnosis not present

## 2018-12-27 DIAGNOSIS — Z7984 Long term (current) use of oral hypoglycemic drugs: Secondary | ICD-10-CM | POA: Diagnosis not present

## 2018-12-27 DIAGNOSIS — R2689 Other abnormalities of gait and mobility: Secondary | ICD-10-CM | POA: Diagnosis not present

## 2018-12-27 DIAGNOSIS — M791 Myalgia, unspecified site: Secondary | ICD-10-CM | POA: Diagnosis not present

## 2018-12-27 DIAGNOSIS — K5900 Constipation, unspecified: Secondary | ICD-10-CM | POA: Diagnosis not present

## 2018-12-31 DIAGNOSIS — Z9981 Dependence on supplemental oxygen: Secondary | ICD-10-CM | POA: Diagnosis not present

## 2018-12-31 DIAGNOSIS — R2689 Other abnormalities of gait and mobility: Secondary | ICD-10-CM | POA: Diagnosis not present

## 2018-12-31 DIAGNOSIS — Z7951 Long term (current) use of inhaled steroids: Secondary | ICD-10-CM | POA: Diagnosis not present

## 2018-12-31 DIAGNOSIS — K5900 Constipation, unspecified: Secondary | ICD-10-CM | POA: Diagnosis not present

## 2018-12-31 DIAGNOSIS — M791 Myalgia, unspecified site: Secondary | ICD-10-CM | POA: Diagnosis not present

## 2018-12-31 DIAGNOSIS — Z7982 Long term (current) use of aspirin: Secondary | ICD-10-CM | POA: Diagnosis not present

## 2018-12-31 DIAGNOSIS — Z79891 Long term (current) use of opiate analgesic: Secondary | ICD-10-CM | POA: Diagnosis not present

## 2018-12-31 DIAGNOSIS — Z7984 Long term (current) use of oral hypoglycemic drugs: Secondary | ICD-10-CM | POA: Diagnosis not present

## 2019-01-02 DIAGNOSIS — K5900 Constipation, unspecified: Secondary | ICD-10-CM | POA: Diagnosis not present

## 2019-01-02 DIAGNOSIS — R2689 Other abnormalities of gait and mobility: Secondary | ICD-10-CM | POA: Diagnosis not present

## 2019-01-02 DIAGNOSIS — Z7951 Long term (current) use of inhaled steroids: Secondary | ICD-10-CM | POA: Diagnosis not present

## 2019-01-02 DIAGNOSIS — M791 Myalgia, unspecified site: Secondary | ICD-10-CM | POA: Diagnosis not present

## 2019-01-02 DIAGNOSIS — Z79891 Long term (current) use of opiate analgesic: Secondary | ICD-10-CM | POA: Diagnosis not present

## 2019-01-02 DIAGNOSIS — Z7982 Long term (current) use of aspirin: Secondary | ICD-10-CM | POA: Diagnosis not present

## 2019-01-02 DIAGNOSIS — Z7984 Long term (current) use of oral hypoglycemic drugs: Secondary | ICD-10-CM | POA: Diagnosis not present

## 2019-01-02 DIAGNOSIS — J9611 Chronic respiratory failure with hypoxia: Secondary | ICD-10-CM | POA: Diagnosis not present

## 2019-01-02 DIAGNOSIS — Z9981 Dependence on supplemental oxygen: Secondary | ICD-10-CM | POA: Diagnosis not present

## 2019-01-02 DIAGNOSIS — J449 Chronic obstructive pulmonary disease, unspecified: Secondary | ICD-10-CM | POA: Diagnosis not present

## 2019-01-08 DIAGNOSIS — Z7984 Long term (current) use of oral hypoglycemic drugs: Secondary | ICD-10-CM | POA: Diagnosis not present

## 2019-01-08 DIAGNOSIS — Z79891 Long term (current) use of opiate analgesic: Secondary | ICD-10-CM | POA: Diagnosis not present

## 2019-01-08 DIAGNOSIS — M791 Myalgia, unspecified site: Secondary | ICD-10-CM | POA: Diagnosis not present

## 2019-01-08 DIAGNOSIS — Z9981 Dependence on supplemental oxygen: Secondary | ICD-10-CM | POA: Diagnosis not present

## 2019-01-08 DIAGNOSIS — K5900 Constipation, unspecified: Secondary | ICD-10-CM | POA: Diagnosis not present

## 2019-01-08 DIAGNOSIS — Z7982 Long term (current) use of aspirin: Secondary | ICD-10-CM | POA: Diagnosis not present

## 2019-01-08 DIAGNOSIS — Z7951 Long term (current) use of inhaled steroids: Secondary | ICD-10-CM | POA: Diagnosis not present

## 2019-01-08 DIAGNOSIS — R2689 Other abnormalities of gait and mobility: Secondary | ICD-10-CM | POA: Diagnosis not present

## 2019-01-10 DIAGNOSIS — Z9981 Dependence on supplemental oxygen: Secondary | ICD-10-CM | POA: Diagnosis not present

## 2019-01-10 DIAGNOSIS — Z79891 Long term (current) use of opiate analgesic: Secondary | ICD-10-CM | POA: Diagnosis not present

## 2019-01-10 DIAGNOSIS — M791 Myalgia, unspecified site: Secondary | ICD-10-CM | POA: Diagnosis not present

## 2019-01-10 DIAGNOSIS — Z7984 Long term (current) use of oral hypoglycemic drugs: Secondary | ICD-10-CM | POA: Diagnosis not present

## 2019-01-10 DIAGNOSIS — R2689 Other abnormalities of gait and mobility: Secondary | ICD-10-CM | POA: Diagnosis not present

## 2019-01-10 DIAGNOSIS — K5900 Constipation, unspecified: Secondary | ICD-10-CM | POA: Diagnosis not present

## 2019-01-10 DIAGNOSIS — Z7982 Long term (current) use of aspirin: Secondary | ICD-10-CM | POA: Diagnosis not present

## 2019-01-10 DIAGNOSIS — Z7951 Long term (current) use of inhaled steroids: Secondary | ICD-10-CM | POA: Diagnosis not present

## 2019-01-16 ENCOUNTER — Other Ambulatory Visit: Payer: Self-pay | Admitting: Internal Medicine

## 2019-01-22 ENCOUNTER — Emergency Department (HOSPITAL_COMMUNITY): Payer: Medicare Other

## 2019-01-22 ENCOUNTER — Telehealth: Payer: Self-pay | Admitting: Internal Medicine

## 2019-01-22 ENCOUNTER — Emergency Department (HOSPITAL_COMMUNITY)
Admission: EM | Admit: 2019-01-22 | Discharge: 2019-01-22 | Disposition: A | Discharge status: Home / Self Care | Payer: Medicare Other | Attending: Emergency Medicine | Admitting: Emergency Medicine

## 2019-01-22 ENCOUNTER — Other Ambulatory Visit: Payer: Self-pay

## 2019-01-22 ENCOUNTER — Encounter (HOSPITAL_COMMUNITY): Payer: Self-pay | Admitting: Emergency Medicine

## 2019-01-22 DIAGNOSIS — R0602 Shortness of breath: Secondary | ICD-10-CM | POA: Diagnosis not present

## 2019-01-22 DIAGNOSIS — J449 Chronic obstructive pulmonary disease, unspecified: Secondary | ICD-10-CM | POA: Diagnosis not present

## 2019-01-22 DIAGNOSIS — I10 Essential (primary) hypertension: Secondary | ICD-10-CM | POA: Insufficient documentation

## 2019-01-22 DIAGNOSIS — Z79899 Other long term (current) drug therapy: Secondary | ICD-10-CM | POA: Insufficient documentation

## 2019-01-22 DIAGNOSIS — Z7982 Long term (current) use of aspirin: Secondary | ICD-10-CM | POA: Insufficient documentation

## 2019-01-22 DIAGNOSIS — J441 Chronic obstructive pulmonary disease with (acute) exacerbation: Secondary | ICD-10-CM | POA: Insufficient documentation

## 2019-01-22 DIAGNOSIS — Z87891 Personal history of nicotine dependence: Secondary | ICD-10-CM | POA: Insufficient documentation

## 2019-01-22 DIAGNOSIS — Z20828 Contact with and (suspected) exposure to other viral communicable diseases: Secondary | ICD-10-CM | POA: Diagnosis not present

## 2019-01-22 LAB — COMPREHENSIVE METABOLIC PANEL
ALT: 18 U/L (ref 0–44)
AST: 17 U/L (ref 15–41)
Albumin: 4.5 g/dL (ref 3.5–5.0)
Alkaline Phosphatase: 151 U/L — ABNORMAL HIGH (ref 38–126)
Anion gap: 14 (ref 5–15)
BUN: 14 mg/dL (ref 8–23)
CO2: 30 mmol/L (ref 22–32)
Calcium: 9.9 mg/dL (ref 8.9–10.3)
Chloride: 86 mmol/L — ABNORMAL LOW (ref 98–111)
Creatinine, Ser: 0.71 mg/dL (ref 0.61–1.24)
GFR calc Af Amer: >60 mL/min (ref >60)
GFR calc non Af Amer: >60 mL/min (ref >60)
Glucose, Bld: 116 mg/dL — ABNORMAL HIGH (ref 70–99)
Potassium: 3.6 mmol/L (ref 3.5–5.1)
Sodium: 130 mmol/L — ABNORMAL LOW (ref 135–145)
Total Bilirubin: 0.7 mg/dL (ref 0.3–1.2)
Total Protein: 7.2 g/dL (ref 6.5–8.1)

## 2019-01-22 LAB — CBC WITH DIFFERENTIAL/PLATELET
Abs Immature Granulocytes: 0.05 10*3/uL (ref 0.00–0.07)
Basophils Absolute: 0 10*3/uL (ref 0.0–0.1)
Basophils Relative: 0 %
Eosinophils Absolute: 0.4 10*3/uL (ref 0.0–0.5)
Eosinophils Relative: 4 %
HCT: 36.6 % — ABNORMAL LOW (ref 39.0–52.0)
Hemoglobin: 11.6 g/dL — ABNORMAL LOW (ref 13.0–17.0)
Immature Granulocytes: 1 %
Lymphocytes Relative: 7 %
Lymphs Abs: 0.7 10*3/uL (ref 0.7–4.0)
MCH: 28.9 pg (ref 26.0–34.0)
MCHC: 31.7 g/dL (ref 30.0–36.0)
MCV: 91 fL (ref 80.0–100.0)
Monocytes Absolute: 0.9 10*3/uL (ref 0.1–1.0)
Monocytes Relative: 9 %
Neutro Abs: 7.6 10*3/uL (ref 1.7–7.7)
Neutrophils Relative %: 79 %
Platelets: 405 10*3/uL — ABNORMAL HIGH (ref 150–400)
RBC: 4.02 MIL/uL — ABNORMAL LOW (ref 4.22–5.81)
RDW: 13.6 % (ref 11.5–15.5)
WBC: 9.7 10*3/uL (ref 4.0–10.5)
nRBC: 0 % (ref 0.0–0.2)

## 2019-01-22 LAB — SARS CORONAVIRUS 2 BY RT PCR (HOSPITAL ORDER, PERFORMED IN ~~LOC~~ HOSPITAL LAB): SARS Coronavirus 2: NEGATIVE

## 2019-01-22 MED ORDER — PREDNISONE 10 MG PO TABS: 40.0000 mg | ORAL_TABLET | Freq: Every day | ORAL | 0 refills | Status: AC

## 2019-01-22 MED ORDER — SODIUM CHLORIDE 0.9 % IV BOLUS
500.0000 mL | Freq: Once | INTRAVENOUS | Status: AC
  Administered 2019-01-22: 500 mL via INTRAVENOUS

## 2019-01-22 MED ORDER — ALBUTEROL SULFATE HFA 108 (90 BASE) MCG/ACT IN AERS
2.0000 | INHALATION_SPRAY | RESPIRATORY_TRACT | Status: DC | PRN
  Administered 2019-01-22: 2 via RESPIRATORY_TRACT
  Filled 2019-01-22: qty 6.7

## 2019-01-22 MED ORDER — IPRATROPIUM-ALBUTEROL 0.5-2.5 (3) MG/3ML IN SOLN
3.0000 mL | Freq: Once | RESPIRATORY_TRACT | Status: AC
  Administered 2019-01-22: 3 mL via RESPIRATORY_TRACT
  Filled 2019-01-22: qty 3

## 2019-01-22 MED ORDER — METHYLPREDNISOLONE SODIUM SUCC 125 MG IJ SOLR
125.0000 mg | Freq: Once | INTRAMUSCULAR | Status: AC
  Administered 2019-01-22: 125 mg via INTRAVENOUS
  Filled 2019-01-22: qty 2

## 2019-01-22 NOTE — ED Triage Notes (Signed)
Pt states that he was sent over by his pulmonologist for a COVID test. Pt c/o chills and SOB x 1 week. Denies fever, cough, sore throat, or COVID contacts. NAD noted. Pt on 2L oxygen.

## 2019-01-22 NOTE — Telephone Encounter (Signed)
Can't help with the pain, chills, lethargy of difficulty swallowing and sounds like 02 sats are fine but worried may have infection can't dx over the phone so rec ER eval (they will likely screen for COVID then ov with me w/in a week with all meds in hand to regroup as he was much better at last ov than what is being described now.

## 2019-01-22 NOTE — Discharge Instructions (Signed)
You have been diagnosed today with COPD exacerbation.  At this time there does not appear to be the presence of an emergent medical condition, however there is always the potential for conditions to change. Please read and follow the below instructions.  Please return to the Emergency Department immediately for any new or worsening symptoms. Please be sure to follow up with your Primary Care Provider within one week regarding your visit today; please call their office to schedule an appointment even if you are feeling better for a follow-up visit. Please continue to use your albuterol inhaler and spacer as shown today to help with your COPD symptoms.  Please continue taking the prednisone medication over the next 4 days to help with your symptoms.  Call your pulmonologist and primary care provider tomorrow morning to schedule follow-up visits. Your sodium and chloride electrolytes were low today, please discuss this with your primary care doctor at your next visit.  Get help right away if: You have shortness of breath while you are resting. You have shortness of breath that prevents you from: Being able to talk. Performing your usual physical activities. You have chest pain Your skin color is more blue (cyanotic) than usual. You measure low oxygen saturations with a pulse oximeter. You have a fever. You feel too tired to breathe normally. You have any new/concerning or worsening symptoms.  Please read the additional information packets attached to your discharge summary.  Do not take your medicine if  develop an itchy rash, swelling in your mouth or lips, or difficulty breathing; call 911 and seek immediate emergency medical attention if this occurs.

## 2019-01-22 NOTE — ED Provider Notes (Addendum)
[facility] EMERGENCY DEPARTMENT Provider Note   CSN: [id number] Arrival date & time: 01/22/19  1724     History   Chief Complaint Chief Complaint  Patient presents with  . Shortness of Breath    HPI Jeffery [name] is a 68 y.o. male with history of COPD, GERD, hypertension, hyperlipidemia, on 2 L nasal cannula home O2 presents today for shortness of breath.  Patient reports that over the past week he has had increasing shortness of breath.  He reports that he will have an intermittent difficulty breathing that he describes as a feeling of difficulty to catch his breath.  Symptoms have been consistent and daily for the past 1 week.  He also endorses chills without fever.  He was sent in by his pulmonologist today for COVID-19 testing.  Patient denies fever, headache, chest pain, cough/hemoptysis, abdominal pain, nausea/vomiting, diarrhea, fall/injury, extremity pain/swelling, numbness/tingling, weakness or any additional concerns.  Patient reports that he has not had to increase his home O2 use and remains on 2 L nasal cannula.  Of note patient has albuterol from pulmonologist that he has been using with some relief of symptoms.  He reports that upon ED arrival he was informed by a nurse that he was using this medication incorrectly, he has been using his albuterol with spacer since arriving to his room and reports improvement of symptoms.     HPI  Past Medical History:  Diagnosis Date  . Chest pain 04/2011   Normal echocardiogram  . COPD (chronic obstructive pulmonary disease) (HCC)   . Fasting hyperglycemia    Normal hemoglobin A1c of 6.0  . GERD (gastroesophageal reflux disease)   . Hyperlipemia   . Hypertension   . On home O2    2L N/C  . On home O2    at night  . Prediabetes 06/04/2015  . Tobacco abuse     Patient Active Problem List   Diagnosis Date Noted  . COPD  GOLD ?  11/04/2018  . Chronic respiratory failure with hypoxia (HCC) 10/04/2018  . Right middle  lobe pneumonia (HCC) 11/07/2016  . Acute on chronic respiratory failure with hypoxia (HCC) 11/07/2016  . Prediabetes 06/04/2015  . Hyperglycemia, drug-induced 06/03/2015  . Essential hypertension 06/03/2015  . COPD exacerbation (HCC) 06/02/2015  . Hyperlipidemia 07/20/2011  . Fasting hyperglycemia   . Tobacco abuse   . Chest pain 04/02/2011    Past Surgical History:  Procedure Laterality Date  . APPENDECTOMY    . APPENDECTOMY    . LEFT HEART CATHETERIZATION WITH CORONARY ANGIOGRAM N/A 05/17/2011   Procedure: LEFT HEART CATHETERIZATION WITH CORONARY ANGIOGRAM;  Surgeon: Tonny Bollman, MD;  Location: [facility] CATH LAB;  Service: Cardiovascular;  Laterality: N/A;        Home Medications    Prior to Admission medications   Medication Sig Start Date End Date Taking? Authorizing Provider  albuterol (PROAIR HFA) 108 (90 Base) MCG/ACT inhaler Inhale 1-2 puffs into the lungs every 4 (four) hours as needed. 11/16/18   Nyoka Cowden, MD  albuterol (PROVENTIL) (2.5 MG/3ML) 0.083% nebulizer solution Take 2.5 mg by nebulization every 6 (six) hours as needed for wheezing or shortness of breath.    [provider]  ALPRAZolam Prudy Feeler) 0.5 MG tablet Take 0.5 mg by mouth at bedtime.    [provider]  aspirin EC 81 MG tablet Take 81 mg by mouth daily.    [provider]  chlorthalidone (HYGROTON) 25 MG tablet Take 25 mg by mouth daily.  [provider]  diltiazem (CARDIZEM CD) 120 MG 24 hr capsule Take 1 capsule by mouth daily. 08/01/18   [provider]  famotidine (PEPCID) 40 MG tablet 1 daily after supper 10/03/18   Tanda Rockers, MD  gabapentin (NEURONTIN) 300 MG capsule Take 300 mg by mouth 3 (three) times daily.  03/22/13   [provider]  ipratropium-albuterol (DUONEB) 0.5-2.5 (3) MG/3ML SOLN USE 1 VIAL VIA NEBULIZER 4 TIMES DAILY. 01/16/19   Tanda Rockers, MD  isosorbide mononitrate (IMDUR) 30 MG 24 hr tablet Take 30 mg by mouth daily.  03/22/14   [provider]  metFORMIN (GLUCOPHAGE) 500 MG tablet Take 500 mg by mouth daily.    [provider]  multivitamin (ONE-A-DAY MEN'S) TABS Take 1 tablet by mouth daily.      [provider]  omeprazole (PRILOSEC) 20 MG capsule Take 20 mg by mouth daily.    [provider]  OXYGEN 2lpm 24/7    [provider]  potassium chloride SA (K-DUR,KLOR-CON) 20 MEQ tablet Take 20 mEq by mouth daily.  05/12/14   [provider]  predniSONE (DELTASONE) 10 MG tablet Take 4 tablets (40 mg total) by mouth daily for 4 days. 01/22/19 01/26/19  Nuala Alpha A, PA-C  simvastatin (ZOCOR) 20 MG tablet Take 20 mg by mouth at bedtime.  03/22/14   [provider]  sucralfate (CARAFATE) 1 g tablet Take 1 tablet by mouth 4 (four) times daily. 07/19/18   [provider]  telmisartan (MICARDIS) 40 MG tablet Take 1 tablet (40 mg total) by mouth daily. Patient taking differently: Take 20 mg by mouth daily.  10/03/18   Tanda Rockers, MD    Family History Family History  Problem Relation Age of Onset  . Emphysema Father     Social History Social History   Tobacco Use  . Smoking status: Former Smoker    Packs/day: 1.00    Years: 35.00    Pack years: 35.00    Types: Cigarettes    Quit date: 05/09/2013    Years since quitting: 5.7  . Smokeless tobacco: Never Used  Substance Use Topics  . Alcohol use: No  . Drug use: No     Allergies   Codeine   Review of Systems Review of Systems Ten systems are reviewed and are negative for acute change except as noted in the HPI   Physical Exam Updated Vital Signs BP 129/84 (BP Location: Left Arm)   Pulse 93   Temp 98 F (36.7 C) (Oral)   Resp 16   Ht 5\' 6"  (1.676 m)   Wt 52 kg   SpO2 100%   BMI 18.50 kg/m   Physical Exam Constitutional:      General: He is not in acute distress.    Appearance: Normal appearance. He is well-developed. He is not ill-appearing or diaphoretic.   HENT:     Head: Normocephalic and atraumatic.     Right Ear: External ear normal.     Left Ear: External ear normal.     Nose: Nose normal.  Eyes:     General: Vision grossly intact. Gaze aligned appropriately.     Pupils: Pupils are equal, round, and reactive to light.  Neck:     Musculoskeletal: Normal range of motion.     Trachea: Trachea and phonation normal. No tracheal deviation.  Cardiovascular:     Rate and Rhythm: Normal rate and regular rhythm.  Pulmonary:     Effort:  Pulmonary effort is normal. No respiratory distress.     Breath sounds: Wheezing present. No rhonchi or rales.  Abdominal:     General: There is no distension.     Palpations: Abdomen is soft.     Tenderness: There is no abdominal tenderness. There is no guarding or rebound.  Musculoskeletal: Normal range of motion.     Right lower leg: He exhibits no tenderness. No edema.     Left lower leg: He exhibits no tenderness. No edema.  Skin:    General: Skin is warm and dry.  Neurological:     Mental Status: He is alert.     GCS: GCS eye subscore is 4. GCS verbal subscore is 5. GCS motor subscore is 6.     Comments: Speech is clear and goal oriented, follows commands Major Cranial nerves without deficit, no facial droop Moves extremities without ataxia, coordination intact  Psychiatric:        Behavior: Behavior normal.      ED Treatments / Results  Labs (all labs ordered are listed, but only abnormal results are displayed) Labs Reviewed  CBC WITH DIFFERENTIAL/PLATELET - Abnormal; Notable for the following components:      Result Value   RBC 4.02 (*)    Hemoglobin 11.6 (*)    HCT 36.6 (*)    Platelets 405 (*)    All other components within normal limits  COMPREHENSIVE METABOLIC PANEL - Abnormal; Notable for the following components:   Sodium 130 (*)    Chloride 86 (*)    Glucose, Bld 116 (*)    Alkaline Phosphatase 151 (*)    All other components within normal limits  SARS CORONAVIRUS 2  (HOSPITAL ORDER, [facility])    EKG EKG Interpretation  Date/Time:  Tuesday January 22 2019 23:04:24 EDT Ventricular Rate:  87 PR Interval:    QRS Duration: 97 QT Interval:  372 QTC Calculation: 448 R Axis:   82 Text Interpretation:  Sinus rhythm Borderline right axis deviation ST elevation, consider anterior injury No significant change since last tracing 30 Sep 2018 Confirmed by Devoria AlbeKnapp, Iva ([id number]) on 01/22/2019 11:08:33 PM   Radiology Dg Chest Port 1 View  Result Date: 01/22/2019 CLINICAL DATA:  Initial evaluation for acute chills with shortness of breath. EXAM: PORTABLE CHEST 1 VIEW COMPARISON:  Prior radiograph from 09/30/2018. FINDINGS: Patient is rotated to the right. Allowing for rotation, cardiac and mediastinal silhouettes are stable in size and contour, and remain within normal limits. Mild aortic atherosclerosis. Lungs are mildly hypoinflated. No focal infiltrates. No edema or effusion. No visible pneumothorax. No acute osseous finding. IMPRESSION: No radiographic evidence for active cardiopulmonary disease. Electronically Signed   By: Rise MuBenjamin  McClintock M.D.   On: 01/22/2019 21:12    Procedures Procedures (including critical care time)  Medications Ordered in ED Medications  albuterol (VENTOLIN HFA) 108 (90 Base) MCG/ACT inhaler 2 puff (has no administration in time range)  ipratropium-albuterol (DUONEB) 0.5-2.5 (3) MG/3ML nebulizer solution 3 mL (3 mLs Nebulization Given 01/22/19 2201)  methylPREDNISolone sodium succinate (SOLU-MEDROL) 125 mg/2 mL injection 125 mg (125 mg Intravenous Given 01/22/19 2155)  sodium chloride 0.9 % bolus 500 mL (500 mLs Intravenous New Bag/Given 01/22/19 2154)     Initial Impression / Assessment and Plan / ED Course  I have reviewed the triage vital signs and the nursing notes.  Pertinent labs & imaging results that were available during my care of the patient were reviewed by me and considered in  my medical  decision making (see chart for details).    On initial evaluation patient is overall well-appearing and in no acute distress.  He reports his symptoms have begun to improve since he was educated on albuterol inhaler/spacer by triage RN.  Heart regular rate and rhythm without murmur, lungs with moderate diffuse wheezings, abdomen soft nontender without peritoneal signs, neurovascular intact to all 4 extremities.  SPO2 100% on his regular 2 L nasal cannula. - CBC nonacute CMP with sodium 130, chloride 86 COVID negative Chest x-ray:    IMPRESSION:  No radiographic evidence for active cardiopulmonary disease.   EKG:  Sinus rhythm Borderline right axis deviation ST elevation, consider anterior injury No significant change since last tracing 30 Sep 2018 Confirmed by [name] ([id number]) on 01/22/2019 11:08:33 PM - Patient seen and evaluated by Dr. Estell Harpin, agrees with COPD exacerbation we will treat with prednisone, albuterol refill and patient will follow-up with PCP and pulmonology. - Patient reassessed resting comfortably no acute distress reports breathing at baseline and is requesting to be discharged.  Vital signs have remained stable throughout ER visit, SPO2 100% on his normal 2 L nasal cannula.  RN to provide education regarding spacer use.  As patient without history of fever or other infectious-like symptoms along with negative COVID-19 test today, low suspicion for viral/infectious etiology of his symptoms.  Additionally patient with a history of pain, no change on EKG and stable vital signs do not suspect PE or other acute cardiopulmonary process as etiology of patient's symptoms today. - At this time there does not appear to be any evidence of an acute emergency medical condition and the patient appears stable for discharge with appropriate outpatient follow up. Diagnosis was discussed with patient who verbalizes understanding of care plan and is agreeable to discharge. I have discussed return  precautions with patient who verbalizes understanding of return precautions. Patient encouraged to follow-up with their PCP and pulmonology. All questions answered.  Patient has been discharged in good condition.  Jeffery [name] was evaluated in Emergency Department on 01/22/2019 for the symptoms described in the history of present illness. He was evaluated in the context of the global COVID-19 pandemic, which necessitated consideration that the patient might be at risk for infection with the SARS-CoV-2 virus that causes COVID-19. Institutional protocols and algorithms that pertain to the evaluation of patients at risk for COVID-19 are in a state of rapid change based on information released by regulatory bodies including the CDC and federal and state organizations. These policies and algorithms were followed during the patient's care in the ED.   Note: Portions of this report may have been transcribed using voice recognition software. Every effort was made to ensure accuracy; however, inadvertent computerized transcription errors may still be present. Final Clinical Impressions(s) / ED Diagnoses   Final diagnoses:  COPD exacerbation [facility])    ED Discharge Orders         Ordered    predniSONE (DELTASONE) 10 MG tablet  Daily     01/22/19 2312           Elizabeth Palau 01/22/19 2322    Elizabeth Palau 01/22/19 2324    Bethann Berkshire, MD 01/24/19 [phone]

## 2019-01-22 NOTE — Telephone Encounter (Signed)
Primary Pulmonologist: MW Last office visit and with whom: 11/28/2018 w/ MW What do we see them for (pulmonary problems): COPD, Chronic Respiratory Failure w/ hypoxia, Essential Hypertension  Reason for call: Called and spoke with pt spouse, Doroteo Bradford (on [place]).Doroteo Bradford states pt has been lethargic, having trouble eating and swallowing, experiencing "cold chills" and having DOE. She states he has also been in pain - further noting he will "holler out" in pain when he first gets up in the morning. She states he will sometimes get up at 4-5 AM to take his breathing treatment and when he gets up to go back to bed or to the bathroom, he gets out of breath with O2 2L. She reports pt's SpO2 running between 94-98% on O2 2L. She states pt is currently taking breathing medications as directed and prednisone; however, has concerns about osteoporosis in pt from long term use side effects of pred. Pt spouse denies pt having any other acute symptoms such has fever/body aches, wheezing, chest pain/tightness.   In the last month, have you been in contact with someone who was confirmed or suspected to have Conoravirus / COVID-19?  No  Do you have any of the following symptoms developed in the last 30 days? Fever: No Cough: No Shortness of breath: Yes, with exertion.  When did your symptoms start?  01/22/2019  If the patient has a fever, what is the last reading?  (use n/a if patient denies fever)  N/A . IF THE PATIENT STATES THEY DO NOT OWN A THERMOMETER, THEY MUST GO AND PURCHASE ONE When did the fever start?: N/A Have you taken any medication to suppress a fever (ie Ibuprofen, Aleve, Tylenol)?: N/A  MW, please advise with your recommendations for this pt. Thank you.

## 2019-01-22 NOTE — Telephone Encounter (Signed)
Called and spoke with the patient's wife. Advised her of Dr. Gustavus Bryant recommendations. She voiced understanding, will take her husband to the ER and will call us back to schedule the follow up appointment for her husband to see Dr. Melvyn Novas. Nothing further needed at this time.

## 2019-01-24 ENCOUNTER — Ambulatory Visit: Payer: Medicare Other | Admitting: Internal Medicine

## 2019-01-28 ENCOUNTER — Ambulatory Visit: Payer: Medicare Other | Admitting: Internal Medicine

## 2019-01-28 ENCOUNTER — Telehealth: Payer: Self-pay | Admitting: Primary Care

## 2019-01-28 ENCOUNTER — Ambulatory Visit: Payer: Medicare Other | Admitting: Primary Care

## 2019-01-28 ENCOUNTER — Encounter: Payer: Self-pay | Admitting: Primary Care

## 2019-01-28 ENCOUNTER — Other Ambulatory Visit: Payer: Self-pay

## 2019-01-28 DIAGNOSIS — J449 Chronic obstructive pulmonary disease, unspecified: Secondary | ICD-10-CM | POA: Diagnosis not present

## 2019-01-28 DIAGNOSIS — Z23 Encounter for immunization: Secondary | ICD-10-CM | POA: Diagnosis not present

## 2019-01-28 DIAGNOSIS — R634 Abnormal weight loss: Secondary | ICD-10-CM

## 2019-01-28 MED ORDER — BUDESONIDE 0.25 MG/2ML IN SUSP: 0.2500 mg | Freq: Two times a day (BID) | RESPIRATORY_TRACT | 12 refills | Status: DC

## 2019-01-28 NOTE — Telephone Encounter (Signed)
Pt's wife returning call.  [phone]. °

## 2019-01-28 NOTE — Addendum Note (Signed)
Addended by: Martyn Ehrich on: 01/28/2019 01:13 PM   Modules accepted: Orders

## 2019-01-28 NOTE — Patient Instructions (Addendum)
It was a pleasure meeting you today   For now continue (ipratropium-albuterol) nebulizer 4 times daily scheduled As needed albuterol rescue inhaler EVERY 4-6 HOURS for breakthrough shortness of breath  We will discuss with Dr. Sherene SiresWert about adding Symbicort back into therapy as maintenance inhaler  (you have a full inhaler in the clear plastic bag that you could use)  Office treatment: You received a high-dose flu vaccine today  Recommendations: Focus on high-protein diet -try Ensure/boost There is medication that can help with weight gain if you want speak with your primary care regarding this  Follow-up: 3 months with Dr. Sherene SiresWert or sooner if needed    High-Protein and High-Calorie Diet Eating high-protein and high-calorie foods can help you to gain weight, heal after an injury, and recover after an illness or surgery. The specific amount of daily protein and calories you need depends on:  Your body weight.  The reason this diet is recommended for you. What is my plan? Generally, a high-protein, high-calorie diet involves:  Eating 250-500 extra calories each day.  Making sure that you get enough of your daily calories from protein. Ask your health care provider how many of your calories should come from protein. Talk with a health care provider, such as a diet and nutrition specialist (dietitian), about how much protein and how many calories you need each day. Follow the diet as directed by your health care provider. What are tips for following this plan?  Preparing meals  Add whole milk, half-and-half, or heavy cream to cereal, pudding, soup, or hot cocoa.  Add whole milk to instant breakfast drinks.  Add peanut butter to oatmeal or smoothies.  Add powdered milk to baked goods, smoothies, or milkshakes.  Add powdered milk, cream, or butter to mashed potatoes.  Add cheese to cooked vegetables.  Make whole-milk yogurt parfaits. Top them with granola, fruit, or nuts.  Add  cottage cheese to your fruit.  Add avocado, cheese, or both to sandwiches or salads.  Add meat, poultry, or seafood to rice, pasta, casseroles, salads, and soups.  Use mayonnaise when making egg salad, chicken salad, or tuna salad.  Use peanut butter as a dip for vegetables or as a topping for pretzels, celery, or crackers.  Add beans to casseroles, dips, and spreads.  Add pureed beans to sauces and soups.  Replace calorie-free drinks with calorie-containing drinks, such as milk and fruit juice.  Replace water with milk or heavy cream when making foods such as oatmeal, pudding, or cocoa. General instructions  Ask your health care provider if you should take a nutritional supplement.  Try to eat six small meals each day instead of three large meals.  Eat a balanced diet. In each meal, include one food that is high in protein.  Keep nutritious snacks available, such as nuts, trail mixes, dried fruit, and yogurt.  If you have kidney disease or diabetes, talk with your health care provider about how much protein is safe for you. Too much protein may put extra stress on your kidneys.  Drink your calories. Choose high-calorie drinks and have them after your meals. What high-protein foods should I eat?  Vegetables Soybeans. Peas. Grains Quinoa. Bulgur wheat. Meats and other proteins Beef, pork, and poultry. Fish and seafood. Eggs. Tofu. Textured vegetable protein (TVP). Peanut butter. Nuts and seeds. Dried beans. Protein powders. Dairy Whole milk. Whole-milk yogurt. Powdered milk. Cheese. [employer] CorporationCottage Cheese. Eggnog. Beverages High-protein supplement drinks. Soy milk. Other foods Protein bars. The items listed above may  not be a complete list of high-protein foods and beverages. Contact a dietitian for more options. What high-calorie foods should I eat? Fruits Dried fruit. Fruit leather. Canned fruit in syrup. Fruit juice. Avocado. Vegetables Vegetables cooked in oil or butter.  Fried potatoes. Grains Pasta. Quick breads. Muffins. Pancakes. Ready-to-eat cereal. Meats and other proteins Peanut butter. Nuts and seeds. Dairy Heavy cream. Whipped cream. Cream cheese. Sour cream. Ice cream. Custard. Pudding. Beverages Meal-replacement beverages. Nutrition shakes. Fruit juice. Sugar-sweetened soft drinks. Seasonings and condiments Salad dressing. Mayonnaise. Alfredo sauce. Fruit preserves or jelly. Honey. Syrup. Sweets and desserts Cake. Cookies. Pie. Pastries. Candy bars. Chocolate. Fats and oils Butter or margarine. Oil. Gravy. Other foods Meal-replacement bars. The items listed above may not be a complete list of high-calorie foods and beverages. Contact a dietitian for more options. Summary  A high-protein, high-calorie diet can help you gain weight or heal faster after an injury, illness, or surgery.  To increase your protein and calories, add ingredients such as whole milk, peanut butter, cheese, beans, meat, or seafood to meal items.  To get enough extra calories each day, include high-calorie foods and beverages at each meal.  Adding a high-calorie drink or shake can be an easy way to help you get enough calories each day. Talk with your healthcare provider or dietitian about the best options for you. This information is not intended to replace advice given to you by your health care provider. Make sure you discuss any questions you have with your health care provider. Document Released: 04/18/2005 Document Revised: 03/31/2017 Document Reviewed: 02/28/2017 Elsevier Patient Education  [place].   Influenza Virus Vaccine (Flucelvax) What is this medicine? INFLUENZA VIRUS VACCINE (in floo EN zuh VAHY ruhs vak SEEN) helps to reduce the risk of getting influenza also known as the flu. The vaccine only helps protect you against some strains of the flu. This medicine may be used for other purposes; ask your health care provider or pharmacist if you  have questions. COMMON BRAND NAME(S): FLUCELVAX What should I tell my health care provider before I take this medicine? They need to know if you have any of these conditions:  bleeding disorder like hemophilia  fever or infection  Guillain-Barre syndrome or other neurological problems  immune system problems  infection with the human immunodeficiency virus (HIV) or AIDS  low blood platelet counts  multiple sclerosis  an unusual or allergic reaction to influenza virus vaccine, other medicines, foods, dyes or preservatives  pregnant or trying to get pregnant  breast-feeding How should I use this medicine? This vaccine is for injection into a muscle. It is given by a health care professional. A copy of Vaccine Information Statements will be given before each vaccination. Read this sheet carefully each time. The sheet may change frequently. Talk to your pediatrician regarding the use of this medicine in children. Special care may be needed. Overdosage: If you think you've taken too much of this medicine contact a poison control center or emergency room at once. Overdosage: If you think you have taken too much of this medicine contact a poison control center or emergency room at once. NOTE: This medicine is only for you. Do not share this medicine with others. What if I miss a dose? This does not apply. What may interact with this medicine?  chemotherapy or radiation therapy  medicines that lower your immune system like etanercept, anakinra, infliximab, and adalimumab  medicines that treat or prevent blood clots like warfarin  phenytoin  steroid medicines like prednisone or cortisone  theophylline  vaccines This list may not describe all possible interactions. Give your health care provider a list of all the medicines, herbs, non-prescription drugs, or dietary supplements you use. Also tell them if you smoke, drink alcohol, or use illegal drugs. Some items may interact with  your medicine. What should I watch for while using this medicine? Report any side effects that do not go away within 3 days to your doctor or health care professional. Call your health care provider if any unusual symptoms occur within 6 weeks of receiving this vaccine. You may still catch the flu, but the illness is not usually as bad. You cannot get the flu from the vaccine. The vaccine will not protect against colds or other illnesses that may cause fever. The vaccine is needed every year. What side effects may I notice from receiving this medicine? Side effects that you should report to your doctor or health care professional as soon as possible:  allergic reactions like skin rash, itching or hives, swelling of the face, lips, or tongue Side effects that usually do not require medical attention (Report these to your doctor or health care professional if they continue or are bothersome.):  fever  headache  muscle aches and pains  pain, tenderness, redness, or swelling at the injection site  tiredness This list may not describe all possible side effects. Call your doctor for medical advice about side effects. You may report side effects to FDA at 1-800-FDA-1088. Where should I keep my medicine? The vaccine will be given by a health care professional in a clinic, pharmacy, doctor's office, or other health care setting. You will not be given vaccine doses to store at home. NOTE: This sheet is a summary. It may not cover all possible information. If you have questions about this medicine, talk to your doctor, pharmacist, or health care provider.  2020 Elsevier/Gold Standard (2011-03-30 14:06:47)

## 2019-01-28 NOTE — Progress Notes (Addendum)
@Patient  ID: Jeffery [name], male    DOB: 07-Apr-1951, 68 y.o.   MRN: [id number]  Chief Complaint  Patient presents with  . Follow-up    Pateint reports that he still has sob with exertion. He reports using his rescue inhaler 2-3 times daily.     Referring provider: Toma DeitersHasanaj, Xaje A, MD  HPI: 68 year old male, former smoker quit in 2015 (35-pack-year history).  Past medical history significant for hypertension, COPD GOLD ?, chronic respiratory failure with hypoxia, hyperlipidemia, prediabetes.  Patient of Dr. Sherene SiresWert, last seen 11/28/2018 (see for original consult in June 2020). He is off ACEi and chlorthalidone changed to 1/2 tab prn leg swelling. Wearing 2L oxygen 24/7. Maintained on duonebs QID and prn albuterol hfa. If using frequently then will need to look closer as maintenance inhaler. He has been on Spiriva HandiHaler and Wixela in the past. Continues prednisone with a floor of 5mg  daily. No PFTs on file  01/28/2019 Patient presents today for 2-3 month follow-up. Wife accompanied by phone call. He is doing a little better since started scheduled duonebs QID and coming off ACE inhibitor. Wife states that she has noticed an improvement and they have had less emergency room visits. He still experiences shortness of breath on exertion and requires his Albuterol rescue inhaler 2-3 times a day. Wife reports some occasional wheezing. He was recently seen in the Emergency Room 5 days ago for COPD exacerbation and given prednisone taper. He is maintained on 5mg  daily. He is doing some of his physical therapy exercises at home. Patient reports weight loss. He brought in all his medications to appointment today. Eosinophils on CBC were 400. Covid Negative.    Allergies  Allergen Reactions  . Codeine Nausea Only    Immunization History  Administered Date(s) Administered  . Fluad Quad(high Dose 65+) 01/28/2019  . Influenza Split 04/13/2011    Past Medical History:  Diagnosis Date  . Chest  pain 04/2011   Normal echocardiogram  . COPD (chronic obstructive pulmonary disease) (HCC)   . Fasting hyperglycemia    Normal hemoglobin A1c of 6.0  . GERD (gastroesophageal reflux disease)   . Hyperlipemia   . Hypertension   . On home O2    2L N/C  . On home O2    at night  . Prediabetes 06/04/2015  . Tobacco abuse     Tobacco History: Social History   Tobacco Use  Smoking Status Former Smoker  . Packs/day: 1.00  . Years: 35.00  . Pack years: 35.00  . Types: Cigarettes  . Quit date: 05/09/2013  . Years since quitting: 5.7  Smokeless Tobacco Never Used   Counseling given: Not Answered   Outpatient Medications Prior to Visit  Medication Sig Dispense Refill  . albuterol (PROAIR HFA) 108 (90 Base) MCG/ACT inhaler Inhale 1-2 puffs into the lungs every 4 (four) hours as needed. 8 g 5  . albuterol (PROVENTIL) (2.5 MG/3ML) 0.083% nebulizer solution Take 2.5 mg by nebulization every 6 (six) hours as needed for wheezing or shortness of breath.    . ALPRAZolam (XANAX) 0.5 MG tablet Take 0.5 mg by mouth at bedtime.    Marland Kitchen. aspirin EC 81 MG tablet Take 81 mg by mouth daily.    . chlorthalidone (HYGROTON) 25 MG tablet Take 25 mg by mouth daily.    Marland Kitchen. diltiazem (CARDIZEM CD) 120 MG 24 hr capsule Take 1 capsule by mouth daily.    . famotidine (PEPCID) 40 MG tablet 1 daily after supper    .  gabapentin (NEURONTIN) 300 MG capsule Take 300 mg by mouth 3 (three) times daily.     Marland Kitchen ipratropium-albuterol (DUONEB) 0.5-2.5 (3) MG/3ML SOLN USE 1 VIAL VIA NEBULIZER 4 TIMES DAILY. 360 mL 0  . isosorbide mononitrate (IMDUR) 30 MG 24 hr tablet Take 30 mg by mouth daily.  1  . metFORMIN (GLUCOPHAGE) 500 MG tablet Take 500 mg by mouth daily.    . multivitamin (ONE-A-DAY MEN'S) TABS Take 1 tablet by mouth daily.      Marland Kitchen omeprazole (PRILOSEC) 20 MG capsule Take 20 mg by mouth daily.    . OXYGEN 2lpm 24/7    . potassium chloride SA (K-DUR,KLOR-CON) 20 MEQ tablet Take 20 mEq by mouth daily.   1  . simvastatin  (ZOCOR) 20 MG tablet Take 20 mg by mouth at bedtime.   1  . sucralfate (CARAFATE) 1 g tablet Take 1 tablet by mouth 4 (four) times daily.    Marland Kitchen telmisartan (MICARDIS) 40 MG tablet Take 1 tablet (40 mg total) by mouth daily. (Patient taking differently: Take 20 mg by mouth daily. ) 30 tablet 11   No facility-administered medications prior to visit.     Review of Systems  Review of Systems  Constitutional: Positive for unexpected weight change.  HENT: Negative.   Respiratory: Positive for shortness of breath and wheezing. Negative for cough.     Physical Exam  BP 104/60 (BP Location: Left Arm, Cuff Size: Normal)   Pulse 84   Temp 97.8 F (36.6 C) (Temporal)   Ht 5\' 6"  (1.676 m)   Wt 105 lb 12.8 oz (48 kg)   SpO2 98%   BMI 17.08 kg/m  Physical Exam Constitutional:      General: He is not in acute distress.    Appearance: He is normal weight. He is not toxic-appearing.     Comments: Thin elderly male  HENT:     Head: Normocephalic and atraumatic.     Nose: Nose normal.     Mouth/Throat:     Mouth: Mucous membranes are moist.     Pharynx: Oropharynx is clear.  Neck:     Musculoskeletal: Normal range of motion and neck supple.  Cardiovascular:     Rate and Rhythm: Normal rate and regular rhythm.  Pulmonary:     Effort: Pulmonary effort is normal. No respiratory distress.     Breath sounds: No wheezing, rhonchi or rales.     Comments: Clear, diminished; on portable home oxygen Musculoskeletal:     Comments: Ambulating with rolling walker  Skin:    General: Skin is warm and dry.  Neurological:     General: No focal deficit present.     Mental Status: He is alert and oriented to person, place, and time. Mental status is at baseline.  Psychiatric:        Mood and Affect: Mood normal.        Behavior: Behavior normal.        Thought Content: Thought content normal.        Judgment: Judgment normal.      Lab Results:  CBC    Component Value Date/Time   WBC 9.7  01/22/2019 2105   RBC 4.02 (L) 01/22/2019 2105   HGB 11.6 (L) 01/22/2019 2105   HCT 36.6 (L) 01/22/2019 2105   PLT 405 (H) 01/22/2019 2105   MCV 91.0 01/22/2019 2105   MCH 28.9 01/22/2019 2105   MCHC 31.7 01/22/2019 2105   RDW 13.6 01/22/2019 2105   LYMPHSABS  0.7 01/22/2019 2105   MONOABS 0.9 01/22/2019 2105   EOSABS 0.4 01/22/2019 2105   BASOSABS 0.0 01/22/2019 2105    BMET    Component Value Date/Time   NA 130 (L) 01/22/2019 2105   K 3.6 01/22/2019 2105   CL 86 (L) 01/22/2019 2105   CO2 30 01/22/2019 2105   GLUCOSE 116 (H) 01/22/2019 2105   BUN 14 01/22/2019 2105   CREATININE 0.71 01/22/2019 2105   CREATININE 0.89 07/18/2011 1015   CALCIUM 9.9 01/22/2019 2105   GFRNONAA >60 01/22/2019 2105   GFRAA >60 01/22/2019 2105    BNP    Component Value Date/Time   BNP 13.0 09/30/2018 2141    ProBNP No results found for: PROBNP  Imaging: Dg Chest Port 1 View  Result Date: 01/22/2019 CLINICAL DATA:  Initial evaluation for acute chills with shortness of breath. EXAM: PORTABLE CHEST 1 VIEW COMPARISON:  Prior radiograph from 09/30/2018. FINDINGS: Patient is rotated to the right. Allowing for rotation, cardiac and mediastinal silhouettes are stable in size and contour, and remain within normal limits. Mild aortic atherosclerosis. Lungs are mildly hypoinflated. No focal infiltrates. No edema or effusion. No visible pneumothorax. No acute osseous finding. IMPRESSION: No radiographic evidence for active cardiopulmonary disease. Electronically Signed   By: Rise Mu M.D.   On: 01/22/2019 21:12     Assessment & Plan:   COPD  GOLD ?  - Stable; continues to have dyspnea on exertion - Compliant with ipratropium-albuterol (DUONEB) nebulizer 4 times daily as scheduled - Requires Albuterol hfa 2-3 times daily - Eosinophils absolute 400 on 9/22 labs in ED  - Discussed with Dr. Sherene Sires, patient had 0% effectiveness with inhaler device. Recommend adding Pulmicort 0.25 nebulizer  twice daily  - Received high dose influenza vaccine today  - Medications reconciled   Weight loss - Weight 105 lbs today (down 11lbs since July office visit) - Focus on high protein/calorie foods - Patient will consider nutrition consult  - Recommend following-up with primary care    Glenford Bayley, NP 01/28/2019

## 2019-01-28 NOTE — Progress Notes (Signed)
Chart and office note reviewed in detail  > agree with a/p as outlined    

## 2019-01-28 NOTE — Telephone Encounter (Signed)
LMTCB

## 2019-01-28 NOTE — Assessment & Plan Note (Addendum)
-   Stable; continues to have dyspnea on exertion - Compliant with ipratropium-albuterol (DUONEB) nebulizer 4 times daily as scheduled - Requires Albuterol hfa 2-3 times daily - Eosinophils absolute 400 on 9/22 labs in ED  - Discussed with Dr. Melvyn Novas, patient had 0% effectiveness with inhaler device. Recommend adding Pulmicort 0.25 nebulizer twice daily  - Received high dose influenza vaccine today  - Medications reconciled

## 2019-01-28 NOTE — Telephone Encounter (Signed)
Please let patient's wife know that I spoke with Dr. Melvyn Novas and he recommended adding a steroid inhaler called Pulmicort twice daily.   He will continue to use ipratropium-albuterol (DUONEB) x4 times daily as scheduled. His albuterol rescue inhaler is back up if he has breakthrough shortness of breath/wheezing.

## 2019-01-28 NOTE — Assessment & Plan Note (Signed)
-   Weight 105 lbs today (down 11lbs since July office visit) - Focus on high protein/calorie foods - Patient will consider nutrition consult  - Recommend following-up with primary care

## 2019-01-28 NOTE — Telephone Encounter (Signed)
I called and spoke with Jeffery [name] and made her aware. I advised her that we would send the Pulmicort to [facility] where it will be better cost wise and she was agreeable to that. Nothing further is needed at this time.

## 2019-01-30 ENCOUNTER — Telehealth: Payer: Self-pay | Admitting: Internal Medicine

## 2019-01-30 DIAGNOSIS — J449 Chronic obstructive pulmonary disease, unspecified: Secondary | ICD-10-CM | POA: Diagnosis not present

## 2019-01-30 NOTE — Telephone Encounter (Signed)
Called spoke with patient's spouse Jeffery [name] (dpr on file) who reported that since receiving the HD flu shot at the 9.28.2020 visit with [facility] NP patient has been experiencing hot and cold flashes and some diarrhea.  Jeffery [name] is not sure if the hot/cold flashes are from the flu vaccine or patient's neuropathy as this is a sign/symptom that he has experienced before.  Did explain to patient that while this is not a live virus and you cannot get the flu from the flu vaccine, it does prepare your body by way of causing you to make antibodies which can make you feel' fluish.'  Also informed Jeffery [name] that per [facility] NP, diarrhea is a common side effect of the flu vaccine.  Per Jeffery Maize NP: continue to monitor symptoms; push fluids and incorporate the BRAT diet.    Patient spouse Jeffery [name] voiced her understanding and denied any further questions/concerns.  She will continue to monitor symptoms, push fluids and bland/BRAT diet.  Nothing further needed at this time; will sign off.

## 2019-02-01 ENCOUNTER — Telehealth: Payer: Self-pay | Admitting: Internal Medicine

## 2019-02-01 DIAGNOSIS — J449 Chronic obstructive pulmonary disease, unspecified: Secondary | ICD-10-CM | POA: Diagnosis not present

## 2019-02-01 DIAGNOSIS — J9611 Chronic respiratory failure with hypoxia: Secondary | ICD-10-CM | POA: Diagnosis not present

## 2019-02-01 NOTE — Telephone Encounter (Signed)
Called and spoke with Patient's wife Doroteo Bradford ([place]).  Doroteo Bradford stated they received a neb medication today and it was not the medication prescribed.  Doroteo Bradford stated they received budesonide for Patient's neb machine.  Per 01/28/19 note from [place], NP, Pulmicort twice a day was prescribed through [facility].  Explained budesonide is the generic for Pulmicort.  Understanding stated. [name] asked if Patient needed to take all nebs currently prescribed.    Per 01/28/19 note from [place], NP- Please let patient's wife know that I spoke with Dr. Melvyn Novas and he recommended adding a steroid inhaler called Pulmicort twice daily.   He will continue to use ipratropium-albuterol (DUONEB) x4 times daily as scheduled. His albuterol rescue inhaler is back up if he has breakthrough shortness of breath/wheezing.   Doroteo Bradford stated understanding.  Nothing further at this time.

## 2019-02-12 ENCOUNTER — Encounter: Payer: Self-pay | Admitting: Primary Care

## 2019-02-12 ENCOUNTER — Ambulatory Visit (INDEPENDENT_AMBULATORY_CARE_PROVIDER_SITE_OTHER): Payer: Medicare Other | Admitting: Primary Care

## 2019-02-12 ENCOUNTER — Telehealth: Payer: Self-pay | Admitting: Primary Care

## 2019-02-12 DIAGNOSIS — R600 Localized edema: Secondary | ICD-10-CM

## 2019-02-12 NOTE — Patient Instructions (Signed)
Pedal edema (feet swelling): Advise leg elevation and compression stockings If no improvement follow-up with PCP  If you develop increased shortness of breath let our office know

## 2019-02-12 NOTE — Progress Notes (Signed)
Chart and office note reviewed in detail  > agree with a/p as outlined    

## 2019-02-12 NOTE — Telephone Encounter (Signed)
All returned to patient, he states someone has already called him and he no longer needs anything. I made him to call back if anything changes. Voiced understanding. Nothing further is needed at this time.

## 2019-02-12 NOTE — Progress Notes (Signed)
Virtual Visit via Telephone Note  I connected with Jeffery [name] on 02/12/19 at  9:00 AM EDT by telephone and verified that I am speaking with the correct person using two identifiers.  Location: Patient: Home Provider: Office   I discussed the limitations, risks, security and privacy concerns of performing an evaluation and management service by telephone and the availability of in person appointments. I also discussed with the patient that there may be a patient responsible charge related to this service. The patient expressed understanding and agreed to proceed.  History of Present Illness: 69 year old male, former smoker quit in 2015 (35-pack-year history).  Past medical history significant for hypertension, COPD GOLD ?, chronic respiratory failure with hypoxia, hyperlipidemia, prediabetes.  Patient of Dr. Melvyn Novas, last seen 11/28/2018 (see for original consult in June 2020). He is off ACEi and chlorthalidone changed to 1/2 tab prn leg swelling. Wearing 2L oxygen 24/7. Maintained on duonebs QID and prn albuterol hfa. If using frequently then will need to look closer as maintenance inhaler. He has been on Spiriva HandiHaler and Wixela in the past. Continues prednisone with a floor of 5mg  daily. No PFTs on file  Previous [facility] pulmonary encounter: 01/28/2019 Patient presents today for 2-3 month follow-up. Wife accompanied by phone call. He is doing a little better since started scheduled duonebs QID and coming off ACE inhibitor. Wife states that she has noticed an improvement and they have had less emergency room visits. He still experiences shortness of breath on exertion and requires his Albuterol rescue inhaler 2-3 times a day. Wife reports some occasional wheezing. He was recently seen in the Emergency Room 5 days ago for COPD exacerbation and given prednisone taper. He is maintained on 5mg  daily. He is doing some of his physical therapy exercises at home. Patient reports weight loss. He brought in  all his medications to appointment today. Eosinophils on CBC were 400. Covid Negative.   02/12/2019 Patient contact today for tele-visit, reports increased swelling in feet starting a few days ago. He has no other significant symptoms. Denies changes in breathing or worsening shortness of breath. Reports that he is taking half of his chlorthalidone. Continues Duonebs QID and Pulmicort BID.   Observations/Objective:  - No shortness of breath, wheezing or cough noted during phone conversation  Assessment and Plan:  Pedal edema - Advised leg elevation and compression stockings  - Recommend follow-up with PCP if does not improve - If develops increased shortness of breath needs to notify office or present to ED  Follow Up Instructions:   - As needed if symptoms do not improve or worsen  I discussed the assessment and treatment plan with the patient. The patient was provided an opportunity to ask questions and all were answered. The patient agreed with the plan and demonstrated an understanding of the instructions.   The patient was advised to call back or seek an in-person evaluation if the symptoms worsen or if the condition fails to improve as anticipated.  I provided 10 minutes of non-face-to-face time during this encounter.   Martyn Ehrich, NP

## 2019-02-14 ENCOUNTER — Other Ambulatory Visit: Payer: Self-pay | Admitting: Internal Medicine

## 2019-02-14 ENCOUNTER — Telehealth: Payer: Self-pay | Admitting: Internal Medicine

## 2019-02-14 MED ORDER — BUDESONIDE 0.25 MG/2ML IN SUSP: 0.2500 mg | Freq: Two times a day (BID) | RESPIRATORY_TRACT | 2 refills | Status: DC

## 2019-02-14 NOTE — Telephone Encounter (Signed)
Spoke with patient's wife [place]. She stated that the patient needs a refill of budesonide to be sent into [place]. Advised her that I would go ahead and send this in for him. She verbalized understanding.   Nothing further needed at time of call.

## 2019-02-15 ENCOUNTER — Telehealth: Payer: Self-pay | Admitting: Internal Medicine

## 2019-02-15 MED ORDER — BUDESONIDE 0.25 MG/2ML IN SUSP: 0.2500 mg | Freq: Two times a day (BID) | RESPIRATORY_TRACT | 5 refills | Status: DC

## 2019-02-15 NOTE — Telephone Encounter (Signed)
Left message for patient or wife to call back.

## 2019-02-15 NOTE — Telephone Encounter (Signed)
Spoke with pt's wife. Pt is actually needing a refill on Pulmicort neb solution. Rx has been sent in. Nothing further was needed at this time.

## 2019-02-15 NOTE — Telephone Encounter (Signed)
Pt's wife returning call.  [phone].

## 2019-02-28 ENCOUNTER — Ambulatory Visit: Payer: Medicare Other | Admitting: Internal Medicine

## 2019-03-04 DIAGNOSIS — K219 Gastro-esophageal reflux disease without esophagitis: Secondary | ICD-10-CM | POA: Diagnosis not present

## 2019-03-04 DIAGNOSIS — Z0000 Encounter for general adult medical examination without abnormal findings: Secondary | ICD-10-CM | POA: Diagnosis not present

## 2019-03-04 DIAGNOSIS — Z1389 Encounter for screening for other disorder: Secondary | ICD-10-CM | POA: Diagnosis not present

## 2019-03-04 DIAGNOSIS — I10 Essential (primary) hypertension: Secondary | ICD-10-CM | POA: Diagnosis not present

## 2019-03-04 DIAGNOSIS — J9611 Chronic respiratory failure with hypoxia: Secondary | ICD-10-CM | POA: Diagnosis not present

## 2019-03-04 DIAGNOSIS — J449 Chronic obstructive pulmonary disease, unspecified: Secondary | ICD-10-CM | POA: Diagnosis not present

## 2019-03-14 DIAGNOSIS — J449 Chronic obstructive pulmonary disease, unspecified: Secondary | ICD-10-CM | POA: Diagnosis not present

## 2019-03-18 ENCOUNTER — Other Ambulatory Visit: Payer: Self-pay

## 2019-03-18 NOTE — Patient Outreach (Signed)
[facility]) Care Management  03/18/2019  Jeffery [name] Feb 26, 1951 [id number]   Medication Adherence call to [name] [place] Identifiers Verify spoke with patient he is past due on Telmisartan 40 mg,patient explain he takes 1/2 tablet now not 1 tablet daily,patient has plenty at this time and will order when due. [name] is showing past due under [place].   [place] Direct Dial [phone]  Fax [phone] [name]

## 2019-04-03 DIAGNOSIS — J449 Chronic obstructive pulmonary disease, unspecified: Secondary | ICD-10-CM | POA: Diagnosis not present

## 2019-04-03 DIAGNOSIS — J9611 Chronic respiratory failure with hypoxia: Secondary | ICD-10-CM | POA: Diagnosis not present

## 2019-04-09 DIAGNOSIS — J449 Chronic obstructive pulmonary disease, unspecified: Secondary | ICD-10-CM | POA: Diagnosis not present

## 2019-04-20 ENCOUNTER — Encounter (HOSPITAL_COMMUNITY): Payer: Self-pay

## 2019-04-20 ENCOUNTER — Emergency Department (HOSPITAL_COMMUNITY)
Admission: EM | Admit: 2019-04-20 | Discharge: 2019-04-21 | Disposition: A | Discharge status: Home / Self Care | Payer: Medicare Other | Attending: Emergency Medicine | Admitting: Emergency Medicine

## 2019-04-20 ENCOUNTER — Emergency Department (HOSPITAL_COMMUNITY): Payer: Medicare Other

## 2019-04-20 ENCOUNTER — Other Ambulatory Visit: Payer: Self-pay

## 2019-04-20 DIAGNOSIS — J441 Chronic obstructive pulmonary disease with (acute) exacerbation: Secondary | ICD-10-CM | POA: Insufficient documentation

## 2019-04-20 DIAGNOSIS — Z7984 Long term (current) use of oral hypoglycemic drugs: Secondary | ICD-10-CM | POA: Insufficient documentation

## 2019-04-20 DIAGNOSIS — Z79899 Other long term (current) drug therapy: Secondary | ICD-10-CM | POA: Diagnosis not present

## 2019-04-20 DIAGNOSIS — I10 Essential (primary) hypertension: Secondary | ICD-10-CM | POA: Diagnosis not present

## 2019-04-20 DIAGNOSIS — Z7982 Long term (current) use of aspirin: Secondary | ICD-10-CM | POA: Insufficient documentation

## 2019-04-20 DIAGNOSIS — Z87891 Personal history of nicotine dependence: Secondary | ICD-10-CM | POA: Diagnosis not present

## 2019-04-20 DIAGNOSIS — Z20828 Contact with and (suspected) exposure to other viral communicable diseases: Secondary | ICD-10-CM | POA: Insufficient documentation

## 2019-04-20 DIAGNOSIS — J449 Chronic obstructive pulmonary disease, unspecified: Secondary | ICD-10-CM | POA: Diagnosis not present

## 2019-04-20 DIAGNOSIS — R0602 Shortness of breath: Secondary | ICD-10-CM | POA: Diagnosis present

## 2019-04-20 MED ORDER — ALBUTEROL SULFATE HFA 108 (90 BASE) MCG/ACT IN AERS
2.0000 | INHALATION_SPRAY | RESPIRATORY_TRACT | Status: DC
  Administered 2019-04-20: 22:00:00 2 via RESPIRATORY_TRACT
  Filled 2019-04-20: qty 6.7

## 2019-04-20 MED ORDER — DEXAMETHASONE SODIUM PHOSPHATE 4 MG/ML IJ SOLN
4.0000 mg | Freq: Once | INTRAMUSCULAR | Status: AC
  Administered 2019-04-20: 4 mg via INTRAMUSCULAR
  Filled 2019-04-20: qty 1

## 2019-04-20 MED ORDER — DOXYCYCLINE HYCLATE 100 MG PO CAPS: 100.0000 mg | ORAL_CAPSULE | Freq: Two times a day (BID) | ORAL | 0 refills | Status: DC

## 2019-04-20 MED ORDER — DOXYCYCLINE HYCLATE 100 MG PO TABS
100.0000 mg | ORAL_TABLET | Freq: Once | ORAL | Status: AC
  Administered 2019-04-20: 100 mg via ORAL
  Filled 2019-04-20: qty 1

## 2019-04-20 NOTE — ED Triage Notes (Signed)
Pt has hx of COPD, reports SOB that has increased over the past few days. Pt in on 2L of oxygen all the time. Pt has audible wheezes. Pt able to speak in full Sentences, but is sob at rest.

## 2019-04-20 NOTE — Discharge Instructions (Signed)
You have been given a shot of steroid tonight.  Use your inhalers as needed.  Recommend a repeat chest x-ray in 3 to 4 weeks to make sure everything has resolved appropriately.  Prescription for antibiotic sent to your pharmacy.

## 2019-04-21 NOTE — ED Provider Notes (Signed)
[facility] EMERGENCY DEPARTMENT Provider Note   CSN: [id number] Arrival date & time: 04/20/19  2044     History Chief Complaint  Patient presents with  . Shortness of Breath    Jeffery [name] is a 68 y.o. male.  Chief complaint COPD exacerbation.  Patient with a known history of COPD on 2 L of nasal oxygen all the time presents with increasing wheezing and dyspnea.  He is not in any profound respiratory distress.  Currently on prednisone 5 mg daily.  He has been using his nebulizer treatments at home.  Severity of symptoms moderate.  Exertion makes symptoms worse.        Past Medical History:  Diagnosis Date  . Chest pain 04/2011   Normal echocardiogram  . COPD (chronic obstructive pulmonary disease) (HCC)   . Fasting hyperglycemia    Normal hemoglobin A1c of 6.0  . GERD (gastroesophageal reflux disease)   . Hyperlipemia   . Hypertension   . On home O2    2L N/C  . On home O2    at night  . Prediabetes 06/04/2015  . Tobacco abuse     Patient Active Problem List   Diagnosis Date Noted  . Weight loss 01/28/2019  . COPD  GOLD ?  11/04/2018  . Chronic respiratory failure with hypoxia (HCC) 10/04/2018  . Right middle lobe pneumonia 11/07/2016  . Acute on chronic respiratory failure with hypoxia (HCC) 11/07/2016  . Prediabetes 06/04/2015  . Hyperglycemia, drug-induced 06/03/2015  . Essential hypertension 06/03/2015  . COPD exacerbation (HCC) 06/02/2015  . Hyperlipidemia 07/20/2011  . Fasting hyperglycemia   . Tobacco abuse   . Chest pain 04/02/2011    Past Surgical History:  Procedure Laterality Date  . APPENDECTOMY    . APPENDECTOMY    . LEFT HEART CATHETERIZATION WITH CORONARY ANGIOGRAM N/A 05/17/2011   Procedure: LEFT HEART CATHETERIZATION WITH CORONARY ANGIOGRAM;  Surgeon: Tonny Bollman, MD;  Location: [facility] CATH LAB;  Service: Cardiovascular;  Laterality: N/A;       Family History  Problem Relation Age of Onset  . Emphysema Father     Social  History   Tobacco Use  . Smoking status: Former Smoker    Packs/day: 1.00    Years: 35.00    Pack years: 35.00    Types: Cigarettes    Quit date: 05/09/2013    Years since quitting: 5.9  . Smokeless tobacco: Never Used  Substance Use Topics  . Alcohol use: No  . Drug use: No    Home Medications Prior to Admission medications   Medication Sig Start Date End Date Taking? Authorizing Provider  albuterol (PROAIR HFA) 108 (90 Base) MCG/ACT inhaler Inhale 1-2 puffs into the lungs every 4 (four) hours as needed. 11/16/18   Nyoka Cowden, MD  albuterol (PROVENTIL) (2.5 MG/3ML) 0.083% nebulizer solution Take 2.5 mg by nebulization every 6 (six) hours as needed for wheezing or shortness of breath.    [provider]  ALPRAZolam Prudy Feeler) 0.5 MG tablet Take 0.5 mg by mouth at bedtime.    [provider]  aspirin EC 81 MG tablet Take 81 mg by mouth daily.    [provider]  budesonide (PULMICORT) 0.25 MG/2ML nebulizer solution Take 2 mLs (0.25 mg total) by nebulization 2 (two) times daily. 02/15/19   Nyoka Cowden, MD  chlorthalidone (HYGROTON) 25 MG tablet Take 25 mg by mouth daily.    [provider]  diltiazem (CARDIZEM CD) 120 MG 24 hr capsule Take  1 capsule by mouth daily. 08/01/18   [provider]  doxycycline (VIBRAMYCIN) 100 MG capsule Take 1 capsule (100 mg total) by mouth 2 (two) times daily. 04/20/19   Donnetta Hutchingook, Lowell Makara, MD  famotidine (PEPCID) 40 MG tablet 1 daily after supper 10/03/18   Nyoka CowdenWert, Jhamir B, MD  gabapentin (NEURONTIN) 300 MG capsule Take 300 mg by mouth 3 (three) times daily.  03/22/13   [provider]  ipratropium-albuterol (DUONEB) 0.5-2.5 (3) MG/3ML SOLN USE 1 VIAL VIA NEBULIZER 4 TIMES DAILY. 02/14/19   Nyoka CowdenWert, Oral B, MD  isosorbide mononitrate (IMDUR) 30 MG 24 hr tablet Take 30 mg by mouth daily. 03/22/14   [provider]  metFORMIN (GLUCOPHAGE) 500 MG tablet Take 500 mg by mouth daily.    [provider]  multivitamin (ONE-A-DAY MEN'S) TABS Take 1 tablet by mouth daily.      [provider]  omeprazole (PRILOSEC) 20 MG capsule Take 20 mg by mouth daily.    [provider]  OXYGEN 2lpm 24/7    [provider]  potassium chloride SA (K-DUR,KLOR-CON) 20 MEQ tablet Take 20 mEq by mouth daily.  05/12/14   [provider]  simvastatin (ZOCOR) 20 MG tablet Take 20 mg by mouth at bedtime.  03/22/14   [provider]  sucralfate (CARAFATE) 1 g tablet Take 1 tablet by mouth 4 (four) times daily. 07/19/18   [provider]  telmisartan (MICARDIS) 40 MG tablet Take 1 tablet (40 mg total) by mouth daily. Patient taking differently: Take 20 mg by mouth daily.  10/03/18   Nyoka CowdenWert, Trumaine B, MD    Allergies    Codeine  Review of Systems   Review of Systems  All other systems reviewed and are negative.   Physical Exam Updated Vital Signs BP 132/82   Pulse 80   Temp 98.2 F (36.8 C) (Oral)   Resp 18   Ht 5\' 8"  (1.727 m)   Wt 49.9 kg   SpO2 100%   BMI 16.73 kg/m   Physical Exam Vitals and nursing note reviewed.  Constitutional:      Appearance: He is well-developed.     Comments: nad  HENT:     Head: Normocephalic and atraumatic.  Eyes:     Conjunctiva/sclera: Conjunctivae normal.  Cardiovascular:     Rate and Rhythm: Normal rate and regular rhythm.  Pulmonary:     Effort: Pulmonary effort is normal.     Comments: Bilateral expiratory wheezes. Abdominal:     General: Bowel sounds are normal.     Palpations: Abdomen is soft.  Musculoskeletal:        General: Normal range of motion.     Cervical back: Neck supple.  Skin:    General: Skin is warm and dry.  Neurological:     General: No focal deficit present.     Mental Status: He is alert and oriented to person, place, and time.  Psychiatric:        Behavior: Behavior normal.     ED Results / Procedures / Treatments   Labs (all labs ordered are listed, but only abnormal  results are displayed) Labs Reviewed  NOVEL CORONAVIRUS, NAA (HOSP ORDER, SEND-OUT TO REF LAB; TAT 18-24 HRS)    EKG None  Radiology DG Chest Port 1 View  Result Date: 04/20/2019 CLINICAL DATA:  COPD. EXAM: PORTABLE CHEST 1 VIEW COMPARISON:  01/22/2019 FINDINGS: Again noted are emphysematous changes bilaterally. The lungs are hyperexpanded. There is scattered linear airspace  opacities bilaterally favored to represent scarring or atelectasis. There is no large focal infiltrate. There is a rounded density projecting over the ninth rib posteriorly on the right. There is an additional rounded density projecting over the left mid lung zone between the eighth and ninth ribs posteriorly. There is no acute osseous abnormality. There is no pneumothorax. IMPRESSION: 1. COPD without evidence for focal infiltrate. 2. Bilateral rounded densities concerning for bilateral pulmonary nodules. Further evaluation with an outpatient two-view chest x-ray is recommended in 4-6 weeks for further evaluation of this finding. 3. Scattered linear airspace opacities bilaterally favored to represent atelectasis or scarring. An infiltrate is not entirely excluded on this exam. Electronically Signed   By: Constance Holster M.D.   On: 04/20/2019 22:36    Procedures Procedures (including critical care time)  Medications Ordered in ED Medications  dexamethasone (DECADRON) injection 4 mg (4 mg Intramuscular Given 04/20/19 2219)  doxycycline (VIBRA-TABS) tablet 100 mg (100 mg Oral Given 04/20/19 2339)    ED Course  I have reviewed the triage vital signs and the nursing notes.  Pertinent labs & imaging results that were available during my care of the patient were reviewed by me and considered in my medical decision making (see chart for details).    MDM Rules/Calculators/A&P                      Patient presents with COPD exacerbation.  Chest x-ray shows no obvious pneumonia.  Recommend repeat two-view chest in 4 to 6  weeks.  Decadron 4 mg IM in ED.  Discharge home on doxycycline.  Patient knows to get a repeat chest x-ray Final Clinical Impression(s) / ED Diagnoses Final diagnoses:  COPD exacerbation ([place])    Rx / DC Orders ED Discharge Orders         Ordered    doxycycline (VIBRAMYCIN) 100 MG capsule  2 times daily     04/20/19 2315           Nat Christen, MD 04/21/19 [phone]

## 2019-04-22 ENCOUNTER — Telehealth: Payer: Self-pay

## 2019-04-22 LAB — NOVEL CORONAVIRUS, NAA (HOSP ORDER, SEND-OUT TO REF LAB; TAT 18-24 HRS): SARS-CoV-2, NAA: NOT DETECTED

## 2019-04-22 NOTE — Telephone Encounter (Signed)
Pt's wife Doroteo Bradford called. Left message to return call.

## 2019-04-22 NOTE — Telephone Encounter (Signed)
Wife called to get covid test results. Results still pending. She had questions about quarantining time frames. Reached out to a nurse for support to call her back.  Pt voiced understanding.

## 2019-04-29 ENCOUNTER — Other Ambulatory Visit: Payer: Self-pay | Admitting: Internal Medicine

## 2019-04-29 ENCOUNTER — Encounter: Payer: Self-pay | Admitting: Internal Medicine

## 2019-04-29 ENCOUNTER — Other Ambulatory Visit: Payer: Self-pay

## 2019-04-29 ENCOUNTER — Ambulatory Visit (INDEPENDENT_AMBULATORY_CARE_PROVIDER_SITE_OTHER): Payer: Medicare Other

## 2019-04-29 ENCOUNTER — Ambulatory Visit: Payer: Medicare Other | Admitting: Internal Medicine

## 2019-04-29 DIAGNOSIS — J449 Chronic obstructive pulmonary disease, unspecified: Secondary | ICD-10-CM

## 2019-04-29 DIAGNOSIS — J9611 Chronic respiratory failure with hypoxia: Secondary | ICD-10-CM | POA: Diagnosis not present

## 2019-04-29 MED ORDER — PREDNISONE 5 MG PO TABS: ORAL_TABLET | ORAL | 0 refills | Status: DC

## 2019-04-29 NOTE — Progress Notes (Signed)
LMTCB

## 2019-04-29 NOTE — Assessment & Plan Note (Addendum)
Quit smoking  05/2013 and steroid dep since 2019  - 10/03/2018  After extensive coaching inhaler device,  effectiveness =    0% (can't use them) - d/c all inhalers x for neb 10/03/2018 and d/c ACEi as well   Despite issues with understanding / following structions and clinically very severe copd he is relatively stable on duoneb/bud though not using as instructed  Possible because he is on maint pred.  The goal with a chronic steroid dependent illness is always arriving at the lowest effective dose that controls the disease/symptoms and not accepting a set "formula" which is based on statistics or guidelines that don't always take into account patient  variability or the natural hx of the dz in every individual patient, which may well vary over time.  For now therefore I recommend the patient maintain 20 mg per day ceiling/ 5 mg floor.   Discussed in detail all the  indications, usual  risks and alternatives  relative to the benefits with patient who agrees to proceed with Rx as outlined.         Pt informed of the seriousness of COVID 19 infection as a direct risk to lung health  and safey and to close contacts and should continue to wear a facemask in public and minimize exposure to public locations but especially avoid any area or activity where non-close contacts are not observing distancing or wearing an appropriate face mask.  I strongly recommended vaccine when offered.    >>> f/u in 6 m to avoid exposure to COVID 19 during pandemic.

## 2019-04-29 NOTE — Patient Instructions (Addendum)
Plan A = Automatic = Always=    budsonide / ipatropium-albuterol first thing in am in nebulizer and again 12 hours   Plan B = Backup (to supplement plan A, not to replace it) Only use your albuterol inhaler(PROAIR /red)  as a rescue medication to be used if you can't catch your breath by resting or doing a relaxed purse lip breathing pattern.  - The less you use it, the better it will work when you need it. - Ok to use the inhaler up to 2 puffs  every 4 hours if you must but call for appointment if use goes up over your usual need - Don't leave home without it !!  (think of it like the spare tire for your car)   Plan C = Crisis (instead of Plan B but only if Plan B stops working) - only use your ipatropium/albuterol nebulizer if you first try Plan B and it fails to help > ok to use the nebulizer up to every 4 hours but if start needing it regularly call for immediate appointment  Plan D = Deltasone = Prednisone  Prednisone is 20 mg per day  Until  (5mg  x 4 pills) until better then taper to 5 mg  - if worse go back up    Please schedule a follow up visit in 6 months but call sooner if needed

## 2019-04-29 NOTE — Progress Notes (Signed)
Jeffery [name], male    DOB: 1950/05/04,    MRN: [id number]   Brief patient profile:  28  yobm quit smoking 05/2013 with onset of doe around 2013 and at the point he quit smoking already on neb qid and then placed on variable inhalers and 02 but does not recall ever having  pfts  Seen in ER x 6 x in 2020 so referred to pulmonary clinic 10/03/2018 by Jeffery   Scarlette [name] steroid dep x 2019.    History of Present Illness  10/03/2018  Pulmonary/ 1st office eval/Jeffery [name]  On ACEi/dpi's including spiriva  Chief Complaint  Patient presents with  . Consult    Consult UX:LKGM. States his breathing has its good and bad days. States he has been on oxygen for about 2 years and also uses at night.   Dyspnea:  Much better p ER rx x 6  But only temporarily while  On pred 40 mg then back to 5 mg and flares w/in a week  Cough: dry/choking sensation/ worse when use inhalers esp dpi's Sleep: prone / bed is flat SABA use: multiple and not helpful  02 2lpm hs  rec Take omeprazole 20 mg x 2  About 30-60 before your first meal Take famotidine 40 mg after supper or before bed  Stop all inhalers and just use the nebulizer = duoneb ipratropium /albuterol breafast lunch supper and bedtime automatically. Only use your albuterol nebulizer as a rescue medication  Stop benazapril and start  micardis (telmisartan) 40 mg one daily in its place and call me if there is a problem  Prednisone 20 mg this evening,  Then 20 mg x 2 days, 10 mg x 2 days then stay on 5 mg daily but if get worse go back to 20 mg each am and start over   10/31/2018  f/u ov/Jeffery [name] re:  Copd/ ? acei contributing to some of his symptoms?  Chief Complaint  Patient presents with  . Follow-up    Breathing is doing well today. He states his throat feels dry.    Dyspnea:  Walked to stop sign and back s stopping x two = 250 ft? Cough: better / no longer choking but throat still doesn't feel quite right  Sleeping: ok p xanax  SABA use: avg 2-3 daily  02:  2lpm NP  24/7  rec Reduce your telmisartan to 40 mg one half a pill daily  Duoneb=  ipratropium /albuterol breafast lunch supper and bedtime automatically. As a backup >>> Only use your albuterol nebulizer as a rescue medication to be used if you can't catch your breath by resting or doing a relaxed purse lip breathing pattern.  - The less you use it, the better it will work when you need it. - Ok to use up to  every 3  hours if you must but call for immediate appointment if use goes up over your usual need or resume prednisone at 20 mg   Prednisone is 20 mg per day  Until  (5mg  x 4 pills) until better then taper to 5 mg  - if worse go back up  Please schedule a follow up office visit in 4 weeks, sooner if needed  with all medications /inhalers/ solutions in hand so we can verify exactly what you are taking. This includes all medications from all doctors and over the counters Add: consider adding budesonide to neb next ov if can't reduce      11/28/2018  f/u ov/Jeffery [name] re: COPD /  f/u off acei improved overall - using duoneb qid as   maint rx plus saba hfa "when over does it" then makes him shaky   Chief Complaint  Patient presents with  . Follow-up    Breathing is "so so". He is c/o shakiness and feels like his legs give out too easily. He is using his albuterol inhaler 2 x daily and Duoneb 4 x daily.   Dyspnea:  Walks to stop sign and back on 2lpm Cough: no Sleeping: ok prone flat bed  SABA use: as above 02: 2l  Lpm24/7  rec No change rx     04/29/2019  f/u ov/Jeffery [name] re: Severe copd/ 02 dep / easily confused re details of care  Chief Complaint  Patient presents with  . Follow-up    Breathing is unchanged.   Dyspnea:  No longer walking outside home, doe x room to room  Cough: minimally congested, white mucus esp in am Sleeping: ok flat one pillow SABA use: much less  02: 2lpm 24/7    No obvious day to day or daytime variability or assoc excess/ purulent sputum or mucus plugs or hemoptysis  or cp or chest tightness, subjective wheeze or overt sinus or hb symptoms.   Sleeping as above  without nocturnal  or early am exacerbation  of respiratory  c/o's or need for noct saba. Also denies any obvious fluctuation of symptoms with weather or environmental changes or other aggravating or alleviating factors except as outlined above   No unusual exposure hx or h/o childhood pna/ asthma or knowledge of premature birth.  Current Allergies, Complete Past Medical History, Past Surgical History, Family History, and Social History were reviewed in [facility] electronic medical record.  ROS  The following are not active complaints unless bolded Hoarseness, sore throat, dysphagia, dental problems, itching, sneezing,  nasal congestion or discharge of excess mucus or purulent secretions, ear ache,   fever, chills, sweats, unintended wt loss or wt gain, classically pleuritic or exertional cp,  orthopnea pnd or arm/hand swelling  or leg swelling, presyncope, palpitations, abdominal pain, anorexia, nausea, vomiting, diarrhea  or change in bowel habits or change in bladder habits, change in stools or change in urine, dysuria, hematuria,  rash, arthralgias, visual complaints, headache, numbness, weakness or ataxia or problems with walking or coordination,  change in mood or  memory.        Current Meds  Medication Sig  . predniSONE (DELTASONE) 5 MG tablet Take 5 mg by mouth daily with breakfast.               Past Medical History:  Diagnosis Date  . Chest pain 04/2011   Normal echocardiogram  . COPD (chronic obstructive pulmonary disease) (HCC)   . Fasting hyperglycemia    Normal hemoglobin A1c of 6.0  . GERD (gastroesophageal reflux disease)   . Hyperlipemia   . Hypertension   . On home O2    2L N/C  . On home O2    at night  . Prediabetes 06/04/2015  . Tobacco abuse          Objective:     amb thin bm and   04/29/2019      110  11/28/2018        116   10/31/18 112 lb  (50.8 kg)  10/03/18 114 lb 6.4 oz (51.9 kg)  09/30/18 116 lb (52.6 kg)     Vital signs reviewed - Note on arrival 02 sats  100% on 2lpm POC  HEENT : pt wearing mask not removed for exam due to covid -19 concerns.    NECK :  without JVD/Nodes/TM/ nl carotid upstrokes bilaterally   LUNGS: no acc muscle use,  Mod barrel  contour chest wall with bilateral  Distant bs s audible wheeze and  without cough on insp or exp maneuvers and mod  Hyperresonant  to  percussion bilaterally     CV:  RRR  no s3 or murmur or increase in P2, and no edema   ABD:  soft and nontender with pos mid insp Hoover's  in the supine position. No bruits or organomegaly appreciated, bowel sounds nl  MS:     ext warm without deformities, calf tenderness, cyanosis or clubbing No obvious joint restrictions   SKIN: warm and dry without lesions    NEURO:  alert, approp, nl sensorium with  no motor or cerebellar deficits apparent.         CXR PA and Lateral:   04/29/2019 :    I personally reviewed images and agree with radiology impression as follows:        Assessment

## 2019-04-29 NOTE — Assessment & Plan Note (Signed)
02 2lpm 24/7 as of 10/03/2018   .Adequate control on present rx, reviewed in detail with pt > no change in rx needed    I had an extended discussion with the patient/wife Bonito by speaker phone/  reviewing all relevant studies completed to date and  lasting 15 to 20 minutes of a 25 minute visit    Each maintenance medication was reviewed in detail including most importantly the difference between maintenance and prns and under what circumstances the prns are to be triggered using an action plan format that is not reflected in the computer generated alphabetically organized AVS.     Please see AVS for specific instructions unique to this visit that I personally wrote and verbalized to the the pt in detail and then reviewed with pt  by my nurse highlighting any  changes in therapy recommended at today's visit to their plan of care.

## 2019-04-30 ENCOUNTER — Telehealth: Payer: Self-pay | Admitting: Internal Medicine

## 2019-04-30 NOTE — Telephone Encounter (Signed)
Jeffery Rockers, MD  04/29/2019 4:56 PM EST    Call pt: Reviewed cxr and no acute change so no change in recommendations made at ov - there are no nodules (as I suspected, they were just nipple shadows)   Called and spoke with pt's wife Jeffery [name] letting her know the results of pt's cxr. [name] verbalized understanding. Nothing further needed.

## 2019-05-04 DIAGNOSIS — J9611 Chronic respiratory failure with hypoxia: Secondary | ICD-10-CM | POA: Diagnosis not present

## 2019-05-04 DIAGNOSIS — J449 Chronic obstructive pulmonary disease, unspecified: Secondary | ICD-10-CM | POA: Diagnosis not present

## 2019-05-06 ENCOUNTER — Other Ambulatory Visit: Payer: Self-pay | Admitting: Internal Medicine

## 2019-05-07 DIAGNOSIS — J449 Chronic obstructive pulmonary disease, unspecified: Secondary | ICD-10-CM | POA: Diagnosis not present

## 2019-05-09 DIAGNOSIS — R5383 Other fatigue: Secondary | ICD-10-CM | POA: Diagnosis not present

## 2019-05-13 DIAGNOSIS — R5383 Other fatigue: Secondary | ICD-10-CM | POA: Diagnosis not present

## 2019-05-23 DIAGNOSIS — H40013 Open angle with borderline findings, low risk, bilateral: Secondary | ICD-10-CM | POA: Diagnosis not present

## 2019-05-23 DIAGNOSIS — H2513 Age-related nuclear cataract, bilateral: Secondary | ICD-10-CM | POA: Diagnosis not present

## 2019-05-29 NOTE — H&P (Signed)
Surgical History & Physical  Patient Name: Jeffery [name] DOB: 08/25/1950  Surgery: Cataract extraction with intraocular lens implant phacoemulsification; Left Eye  Surgeon: Fabio Pierce MD Surgery Date:  06/10/2019 Pre-Op Date:  05/23/2019  HPI: A 34 Yr. old male patient -referred by Dr. Daphine Deutscher for cataract evaluation (h/o glaucoma suspect) 1. 1. The patient complains of difficulty when driving, which began many years ago. Both eyes are affected. The episode is constant and gradual. The patient describes foggy, ghosting and hazy symptoms affecting their eyes/vision. The condition's severity decreased since last visit and is moderate. Symptoms occur when the patient is driving and reading. This is negatively affecting the patient's quality of life. HPI Completed by Dr. Fabio Pierce  Medical History: Glaucoma Cataracts Arthritis Diabetes High Blood Pressure LDL Stroke  Review of Systems Musculoskeletal Joint Ache Respiratory Shortness of Breath All recorded systems are negative except as noted above.  Social   Former smoker   Medication Albuterol, Alprazolam, Aspirin, Budesonide, Chlorthalidone, Diltiazem, Doxycycline, Famotidine, Gabapentin, Ipratropium Bromide, Isosorbide Dinitrate, Metformin, Multivitamin, Omeprazole, O2, Potassium chloride, Prednisone, Simvastatin, Sucralfate, Telmisartan,   Sx/Procedures  None  Drug Allergies   NKDA  History & Physical: Heent:  Cataract, Left eye NECK: supple without bruits LUNGS: lungs clear to auscultation CV: regular rate and rhythm Abdomen: soft and non-tender  Impression & Plan: Assessment: 1.  NUCLEAR SCLEROSIS AGE RELATED; Both Eyes (H25.13) 2.  OAG BORDERLINE FINDINGS LOW RISK; Both Eyes (H40.013)  Plan: 1.  Cataract accounts for the patient's decreased vision. This visual impairment is not correctable with a tolerable change in glasses or contact lenses. Cataract surgery with an implantation of a new lens should  significantly improve the visual and functional status of the patient. Discussed all risks, benefits, alternatives, and potential complications. Discussed the procedures and recovery. Patient desires to have surgery. A-scan ordered and performed today for intra-ocular lens calculations. The surgery will be performed in order to improve vision for driving, reading, and for eye examinations. Recommend phacoemulsification with intra-ocular lens. Left Eye. Dilates poorly - shugacaine by protocol. Omidira. 2.  Based on cup-to-disc ratio. Negative Family history. Likely physiologic due to high myopia. Will re-eval after cataract surgery. Detailed discussion about glaucoma today including importance of maintaining good follow up and following treatment plan, and the possibility of irreversible blindness as part of this disease process.

## 2019-06-03 DIAGNOSIS — H2512 Age-related nuclear cataract, left eye: Secondary | ICD-10-CM | POA: Diagnosis not present

## 2019-06-04 DIAGNOSIS — J9611 Chronic respiratory failure with hypoxia: Secondary | ICD-10-CM | POA: Diagnosis not present

## 2019-06-04 DIAGNOSIS — J449 Chronic obstructive pulmonary disease, unspecified: Secondary | ICD-10-CM | POA: Diagnosis not present

## 2019-06-05 DIAGNOSIS — J449 Chronic obstructive pulmonary disease, unspecified: Secondary | ICD-10-CM | POA: Diagnosis not present

## 2019-06-06 ENCOUNTER — Other Ambulatory Visit (HOSPITAL_COMMUNITY)
Admission: RE | Admit: 2019-06-06 | Discharge: 2019-06-06 | Disposition: A | Discharge status: Home / Self Care | Payer: Medicare Other | Source: Ambulatory Visit | Attending: Ophthalmology | Admitting: Ophthalmology

## 2019-06-06 ENCOUNTER — Encounter (HOSPITAL_COMMUNITY)
Admission: RE | Admit: 2019-06-06 | Discharge: 2019-06-06 | Disposition: A | Discharge status: Home / Self Care | Payer: Medicare Other | Source: Ambulatory Visit | Attending: Ophthalmology | Admitting: Ophthalmology

## 2019-06-06 ENCOUNTER — Other Ambulatory Visit: Payer: Self-pay

## 2019-06-06 DIAGNOSIS — Z01812 Encounter for preprocedural laboratory examination: Secondary | ICD-10-CM | POA: Insufficient documentation

## 2019-06-06 DIAGNOSIS — Z20822 Contact with and (suspected) exposure to covid-19: Secondary | ICD-10-CM | POA: Diagnosis not present

## 2019-06-06 LAB — SARS CORONAVIRUS 2 (TAT 6-24 HRS): SARS Coronavirus 2: NEGATIVE

## 2019-06-09 ENCOUNTER — Telehealth: Payer: Self-pay

## 2019-06-09 NOTE — Telephone Encounter (Signed)
Called and informed patient that test for Covid 19 was NEGATIVE. Discussed signs and symptoms of Covid 19 : fever, chills, respiratory symptoms, cough, ENT symptoms, sore throat, SOB, muscle pain, diarrhea, headache, loss of taste/smell, close exposure to COVID-19 patient. Pt instructed to call PCP if they develop the above signs and sx. Pt also instructed to call 911 if having respiratory issues/distress. . Pt verbalized understanding. Spoke with pt's wife. 

## 2019-06-10 ENCOUNTER — Ambulatory Visit (HOSPITAL_COMMUNITY)
Admission: RE | Admit: 2019-06-10 | Discharge: 2019-06-10 | Disposition: A | Discharge status: Home / Self Care | Payer: Medicare Other | Attending: Ophthalmology | Admitting: Ophthalmology

## 2019-06-10 ENCOUNTER — Encounter (HOSPITAL_COMMUNITY): Payer: Self-pay | Admitting: Ophthalmology

## 2019-06-10 ENCOUNTER — Ambulatory Visit (HOSPITAL_COMMUNITY): Payer: Medicare Other | Admitting: Anesthesiology

## 2019-06-10 ENCOUNTER — Encounter (HOSPITAL_COMMUNITY)
Admission: RE | Disposition: A | Discharge status: Home / Self Care | Payer: Self-pay | Source: Home / Self Care | Attending: Ophthalmology

## 2019-06-10 DIAGNOSIS — Z79899 Other long term (current) drug therapy: Secondary | ICD-10-CM | POA: Diagnosis not present

## 2019-06-10 DIAGNOSIS — Z7982 Long term (current) use of aspirin: Secondary | ICD-10-CM | POA: Insufficient documentation

## 2019-06-10 DIAGNOSIS — I10 Essential (primary) hypertension: Secondary | ICD-10-CM | POA: Insufficient documentation

## 2019-06-10 DIAGNOSIS — K219 Gastro-esophageal reflux disease without esophagitis: Secondary | ICD-10-CM | POA: Diagnosis not present

## 2019-06-10 DIAGNOSIS — E119 Type 2 diabetes mellitus without complications: Secondary | ICD-10-CM | POA: Diagnosis not present

## 2019-06-10 DIAGNOSIS — Z87891 Personal history of nicotine dependence: Secondary | ICD-10-CM | POA: Insufficient documentation

## 2019-06-10 DIAGNOSIS — J449 Chronic obstructive pulmonary disease, unspecified: Secondary | ICD-10-CM | POA: Insufficient documentation

## 2019-06-10 DIAGNOSIS — H2512 Age-related nuclear cataract, left eye: Secondary | ICD-10-CM | POA: Insufficient documentation

## 2019-06-10 DIAGNOSIS — Z7984 Long term (current) use of oral hypoglycemic drugs: Secondary | ICD-10-CM | POA: Diagnosis not present

## 2019-06-10 DIAGNOSIS — Z9981 Dependence on supplemental oxygen: Secondary | ICD-10-CM | POA: Diagnosis not present

## 2019-06-10 DIAGNOSIS — E1136 Type 2 diabetes mellitus with diabetic cataract: Secondary | ICD-10-CM | POA: Diagnosis not present

## 2019-06-10 HISTORY — PX: CATARACT EXTRACTION W/PHACO: SHX586

## 2019-06-10 LAB — GLUCOSE, CAPILLARY: Glucose-Capillary: 118 mg/dL — ABNORMAL HIGH (ref 70–99)

## 2019-06-10 SURGERY — PHACOEMULSIFICATION, CATARACT, WITH IOL INSERTION
Anesthesia: Monitor Anesthesia Care | Site: Eye | Laterality: Left

## 2019-06-10 MED ORDER — PHENYLEPHRINE-KETOROLAC 1-0.3 % IO SOLN
INTRAOCULAR | Status: DC | PRN
  Administered 2019-06-10: 10:00:00 500 mL via OPHTHALMIC

## 2019-06-10 MED ORDER — PHENYLEPHRINE HCL 2.5 % OP SOLN
1.0000 [drp] | OPHTHALMIC | Status: AC | PRN
  Administered 2019-06-10 (×3): 1 [drp] via OPHTHALMIC

## 2019-06-10 MED ORDER — TETRACAINE HCL 0.5 % OP SOLN
1.0000 [drp] | OPHTHALMIC | Status: AC | PRN
  Administered 2019-06-10 (×3): 1 [drp] via OPHTHALMIC

## 2019-06-10 MED ORDER — SODIUM HYALURONATE 23 MG/ML IO SOLN
INTRAOCULAR | Status: DC | PRN
  Administered 2019-06-10: 0.6 mL via INTRAOCULAR

## 2019-06-10 MED ORDER — BSS IO SOLN
INTRAOCULAR | Status: DC | PRN
  Administered 2019-06-10: 15 mL via INTRAOCULAR

## 2019-06-10 MED ORDER — POVIDONE-IODINE 5 % OP SOLN
OPHTHALMIC | Status: DC | PRN
  Administered 2019-06-10: 1 via OPHTHALMIC

## 2019-06-10 MED ORDER — LIDOCAINE HCL (PF) 1 % IJ SOLN
INTRAOCULAR | Status: DC | PRN
  Administered 2019-06-10: 10:00:00 1 mL via OPHTHALMIC

## 2019-06-10 MED ORDER — LIDOCAINE HCL 3.5 % OP GEL: 1.0000 "application " | Freq: Once | OPHTHALMIC | Status: DC

## 2019-06-10 MED ORDER — CYCLOPENTOLATE-PHENYLEPHRINE 0.2-1 % OP SOLN
1.0000 [drp] | OPHTHALMIC | Status: AC | PRN
  Administered 2019-06-10 (×3): 1 [drp] via OPHTHALMIC

## 2019-06-10 MED ORDER — PHENYLEPHRINE-KETOROLAC 1-0.3 % IO SOLN
INTRAOCULAR | Status: AC
  Filled 2019-06-10: qty 4

## 2019-06-10 MED ORDER — EPINEPHRINE PF 1 MG/ML IJ SOLN
INTRAMUSCULAR | Status: AC
  Filled 2019-06-10: qty 2

## 2019-06-10 MED ORDER — PROVISC 10 MG/ML IO SOLN
INTRAOCULAR | Status: DC | PRN
  Administered 2019-06-10: 0.85 mL via INTRAOCULAR

## 2019-06-10 MED ORDER — NEOMYCIN-POLYMYXIN-DEXAMETH 3.5-10000-0.1 OP SUSP
OPHTHALMIC | Status: DC | PRN
  Administered 2019-06-10: 1 [drp] via OPHTHALMIC

## 2019-06-10 SURGICAL SUPPLY — 13 items
CLOTH BEACON ORANGE TIMEOUT ST (SAFETY) ×3 IMPLANT
EYE SHIELD UNIVERSAL CLEAR (GAUZE/BANDAGES/DRESSINGS) ×3 IMPLANT
GLOVE BIOGEL PI IND STRL 7.0 (GLOVE) ×2 IMPLANT
GLOVE BIOGEL PI INDICATOR 7.0 (GLOVE) ×4
GOWN STRL REUS W/TWL LRG LVL3 (GOWN DISPOSABLE) ×3 IMPLANT
LENS ALC ACRYL/TECN (Ophthalmic Related) ×3 IMPLANT
NEEDLE HYPO 18GX1.5 BLUNT FILL (NEEDLE) ×3 IMPLANT
PAD ARMBOARD 7.5X6 YLW CONV (MISCELLANEOUS) ×3 IMPLANT
SYR TB 1ML LL NO SAFETY (SYRINGE) ×3 IMPLANT
TAPE SURG TRANSPORE 1 IN (GAUZE/BANDAGES/DRESSINGS) ×1 IMPLANT
TAPE SURGICAL TRANSPORE 1 IN (GAUZE/BANDAGES/DRESSINGS) ×2
VISCOELASTIC ADDITIONAL (OPHTHALMIC RELATED) ×3 IMPLANT
WATER STERILE IRR 250ML POUR (IV SOLUTION) ×3 IMPLANT

## 2019-06-10 NOTE — Anesthesia Postprocedure Evaluation (Signed)
Anesthesia Post Note  Patient: Jeffery [name]  Procedure(s) Performed: CATARACT EXTRACTION PHACO AND INTRAOCULAR LENS PLACEMENT ([place]) (Left Eye)  Patient location during evaluation: PACU Anesthesia Type: MAC Level of consciousness: awake and alert and oriented Pain management: pain level controlled Vital Signs Assessment: post-procedure vital signs reviewed and stable Respiratory status: spontaneous breathing Postop Assessment: no apparent nausea or vomiting Anesthetic complications: no     Last Vitals:  Vitals:   06/10/19 0855  BP: 113/74  Pulse: 82  Resp: 16  Temp: 36.6 C  SpO2: 100%    Last Pain:  Vitals:   06/10/19 0855  TempSrc: Oral  PainSc: 0-No pain                 Rudene Poulsen

## 2019-06-10 NOTE — Discharge Instructions (Signed)
Please discharge patient when stable, will follow up today with Dr. Jackee Glasner at the [facility] [place] office immediately following discharge.  Leave shield in place until visit.  All paperwork with discharge instructions will be given at the office.  [facility] Address:  [address]  , [place] [address]  

## 2019-06-10 NOTE — Interval H&P Note (Signed)
History and Physical Interval Note: The H and P was reviewed and updated. The patient was examined.  No changes were found after exam.  The surgical eye was marked.  06/10/2019 9:28 AM  Jeffery [name]  has presented today for surgery, with the diagnosis of Nuclear sclerotic cataract - Left eye.  The various methods of treatment have been discussed with the patient and family. After consideration of risks, benefits and other options for treatment, the patient has consented to  Procedure(s) with comments: CATARACT EXTRACTION PHACO AND INTRAOCULAR LENS PLACEMENT (IOC) (Left) - left as a surgical intervention.  The patient's history has been reviewed, patient examined, no change in status, stable for surgery.  I have reviewed the patient's chart and labs.  Questions were answered to the patient's satisfaction.     Fabio Pierce

## 2019-06-10 NOTE — Transfer of Care (Signed)
Immediate Anesthesia Transfer of Care Note  Patient: Jeffery [name]  Procedure(s) Performed: CATARACT EXTRACTION PHACO AND INTRAOCULAR LENS PLACEMENT (I[facility]) (Left Eye)  Patient Location: PACU  Anesthesia Type:MAC  Level of Consciousness: awake  Airway & Oxygen Therapy: Patient Spontanous Breathing  Post-op Assessment: Report given to RN  Post vital signs: Reviewed  Last Vitals:  Vitals Value Taken Time  BP    Temp    Pulse    Resp    SpO2      Last Pain:  Vitals:   06/10/19 0855  TempSrc: Oral  PainSc: 0-No pain      Patients Stated Pain Goal: 5 (09/81/19 1478)  Complications: No apparent anesthesia complications

## 2019-06-10 NOTE — Anesthesia Preprocedure Evaluation (Signed)
Anesthesia Evaluation  Patient identified by MRN, date of birth, ID band Patient awake    Reviewed: Allergy & Precautions, NPO status , Patient's Chart, lab work & pertinent test results  Airway Mallampati: II   Neck ROM: Full    Dental  (+) Missing,    Pulmonary shortness of breath and with exertion, pneumonia, resolved, COPD,  COPD inhaler and oxygen dependent, former smoker,    Pulmonary exam normal        Cardiovascular hypertension, Pt. on medications + angina with exertion  Rhythm:Regular Rate:Normal     Neuro/Psych negative neurological ROS  negative psych ROS   GI/Hepatic GERD  Medicated,  Endo/Other  diabetes, Well Controlled, Type 2  Renal/GU      Musculoskeletal   Abdominal   Peds  Hematology   Anesthesia Other Findings   Reproductive/Obstetrics                             Anesthesia Physical Anesthesia Plan  ASA: IV  Anesthesia Plan: MAC   Post-op Pain Management:    Induction:   PONV Risk Score and Plan:   Airway Management Planned: Nasal Cannula and Natural Airway  Additional Equipment:   Intra-op Plan:   Post-operative Plan:   Informed Consent: I have reviewed the patients History and Physical, chart, labs and discussed the procedure including the risks, benefits and alternatives for the proposed anesthesia with the patient or authorized representative who has indicated his/her understanding and acceptance.     Dental advisory given  Plan Discussed with:   Anesthesia Plan Comments:         Anesthesia Quick Evaluation

## 2019-06-10 NOTE — Op Note (Signed)
Date of procedure: 06/10/19  Pre-operative diagnosis: Visually significant age-related nuclear cataract, Left Eye (H25.12)  Post-operative diagnosis: Visually significant age-related nuclear cataract, Left Eye  Procedure: Removal of cataract via phacoemulsification and insertion of intra-ocular lens Wynetta Emery and [place]  +14.0D into the capsular bag of the Left Eye  Attending surgeon: Gerda Diss. Johann Santone, MD, MA  Anesthesia: MAC, Topical Akten  Complications: None  Estimated Blood Loss: <42m (minimal)  Specimens: None  Implants: As above  Indications:  Visually significant age-related cataract, Left Eye  Procedure:  The patient was seen and identified in the pre-operative area. The operative eye was identified and dilated.  The operative eye was marked.  Topical anesthesia was administered to the operative eye.     The patient was then to the operative suite and placed in the supine position.  A timeout was performed confirming the patient, procedure to be performed, and all other relevant information.   The patient's face was prepped and draped in the usual fashion for intra-ocular surgery.  A lid speculum was placed into the operative eye and the surgical microscope moved into place and focused.  An inferotemporal paracentesis was created using a 20 gauge paracentesis blade.  Shugarcaine was injected into the anterior chamber.  Viscoelastic was injected into the anterior chamber.  A temporal clear-corneal main wound incision was created using a 2.485mmicrokeratome.  A continuous curvilinear capsulorrhexis was initiated using an irrigating cystitome and completed using capsulorrhexis forceps.  Hydrodissection and hydrodeliniation were performed.  Viscoelastic was injected into the anterior chamber.  A phacoemulsification handpiece and a chopper as a second instrument were used to remove the nucleus and epinucleus. The irrigation/aspiration handpiece was used to remove any remaining  cortical material.   The capsular bag was reinflated with viscoelastic, checked, and found to be intact.  The intraocular lens was inserted into the capsular bag.  The irrigation/aspiration handpiece was used to remove any remaining viscoelastic.  The clear corneal wound and paracentesis wounds were then hydrated and checked with Weck-Cels to be watertight.  The lid-speculum and drape was removed, and the patient's face was cleaned with a wet and dry 4x4.  Maxitrol was instilled in the eye before a clear shield was taped over the eye. The patient was taken to the post-operative care unit in good condition, having tolerated the procedure well.  Post-Op Instructions: [facility] and will receive all other orders and instructions.

## 2019-06-17 DIAGNOSIS — H2511 Age-related nuclear cataract, right eye: Secondary | ICD-10-CM | POA: Diagnosis not present

## 2019-06-18 NOTE — H&P (Signed)
Surgical History & Physical  Patient Name: Jeffery [name] DOB: [birth year]  Surgery: Cataract extraction with intraocular lens implant phacoemulsification; Right Eye  Surgeon: Fabio Pierce MD Surgery Date:  06/24/2019 Pre-Op Date:  06/17/2019  HPI: A 69 Yr. old male patient 1. The patient is returning after cataract post-op. The left eye is affected. Status post cataract post-op, which began 1 week ago: Since the last visit, the affected area feels improvement. The patient's vision is improved. Patient is following medication instructions. 2. 2. The patient complains of difficulty when driving, which began 1 year ago. The right eye is affected. The episode is gradual. The condition's severity increased since last visit. Symptoms occur when the patient is driving, inside and outside. The patient describes foggy, ghosting and hazy symptoms affecting their eyes/vision. This is negatively affecting the patient's quality of life. HPI was performed by Fabio Pierce .  Medical History: Glaucoma Cataracts Arthritis Diabetes High Blood Pressure LDL Stroke  Review of Systems Musculoskeletal Joint Ache Respiratory Shortness of Breath All recorded systems are negative except as noted above.  Social   Former smoker   Medication Prednisolone-gatiflox-bromfenac,  Albuterol, Alprazolam, Aspirin, Budesonide, Chlorthalidone, Diltiazem, Doxycycline, Famotidine, Gabapentin, Ipratropium Bromide, Isosorbide Dinitrate, Metformin, Multivitamin, Omeprazole, O2, Potassium chloride, Prednisone, Simvastatin, Sucralfate, Telmisartan,   Sx/Procedures Phaco c IOL OS,   Drug Allergies   NKDA  History & Physical: Heent:  Cataract, Right eye NECK: supple without bruits LUNGS: lungs clear to auscultation CV: regular rate and rhythm Abdomen: soft and non-tender  Impression & Plan: Assessment: 1.  CATARACT EXTRACTION STATUS; Left Eye (Z98.42) 2.  NUCLEAR SCLEROSIS AGE RELATED; Right Eye (H25.11) 3.   Myopia ; Right Eye (H52.11)  Plan: 1.  1 week after cataract surgery. Doing well with improved vision and normal eye pressure. Call with any problems or concerns. Continue Gati-Brom-Pred 2x/day for 3 more weeks. 2.  Cataract accounts for the patient's decreased vision. This visual impairment is not correctable with a tolerable change in glasses or contact lenses. Cataract surgery with an implantation of a new lens should significantly improve the visual and functional status of the patient. Discussed all risks, benefits, alternatives, and potential complications. Discussed the procedures and recovery. Patient desires to have surgery. A-scan ordered and performed today for intra-ocular lens calculations. The surgery will be performed in order to improve vision for driving, reading, and for eye examinations. Recommend phacoemulsification with intra-ocular lens. Surgery required to correct imbalance of vision. Right eye. Dilates poorly - shugacaine by protocol. Omidira.

## 2019-06-20 ENCOUNTER — Encounter (HOSPITAL_COMMUNITY)
Admission: RE | Admit: 2019-06-20 | Discharge: 2019-06-20 | Disposition: A | Discharge status: Home / Self Care | Payer: Medicare Other | Source: Ambulatory Visit | Attending: Ophthalmology | Admitting: Ophthalmology

## 2019-06-21 ENCOUNTER — Other Ambulatory Visit (HOSPITAL_COMMUNITY)
Admission: RE | Admit: 2019-06-21 | Discharge: 2019-06-21 | Disposition: A | Discharge status: Home / Self Care | Payer: Medicare Other | Source: Ambulatory Visit | Attending: Ophthalmology | Admitting: Ophthalmology

## 2019-06-21 ENCOUNTER — Other Ambulatory Visit: Payer: Self-pay | Admitting: Internal Medicine

## 2019-06-21 ENCOUNTER — Other Ambulatory Visit: Payer: Self-pay

## 2019-06-21 DIAGNOSIS — Z20822 Contact with and (suspected) exposure to covid-19: Secondary | ICD-10-CM | POA: Insufficient documentation

## 2019-06-21 DIAGNOSIS — Z01812 Encounter for preprocedural laboratory examination: Secondary | ICD-10-CM | POA: Insufficient documentation

## 2019-06-21 LAB — SARS CORONAVIRUS 2 (TAT 6-24 HRS): SARS Coronavirus 2: NEGATIVE

## 2019-06-24 ENCOUNTER — Other Ambulatory Visit: Payer: Self-pay

## 2019-06-24 ENCOUNTER — Encounter (HOSPITAL_COMMUNITY)
Admission: RE | Disposition: A | Discharge status: Home / Self Care | Payer: Self-pay | Source: Home / Self Care | Attending: Ophthalmology

## 2019-06-24 ENCOUNTER — Ambulatory Visit (HOSPITAL_COMMUNITY): Payer: Medicare Other | Admitting: Anesthesiology

## 2019-06-24 ENCOUNTER — Encounter (HOSPITAL_COMMUNITY): Payer: Self-pay | Admitting: Ophthalmology

## 2019-06-24 ENCOUNTER — Ambulatory Visit (HOSPITAL_COMMUNITY)
Admission: RE | Admit: 2019-06-24 | Discharge: 2019-06-24 | Disposition: A | Discharge status: Home / Self Care | Payer: Medicare Other | Attending: Ophthalmology | Admitting: Ophthalmology

## 2019-06-24 DIAGNOSIS — I10 Essential (primary) hypertension: Secondary | ICD-10-CM | POA: Insufficient documentation

## 2019-06-24 DIAGNOSIS — Z7982 Long term (current) use of aspirin: Secondary | ICD-10-CM | POA: Insufficient documentation

## 2019-06-24 DIAGNOSIS — E1136 Type 2 diabetes mellitus with diabetic cataract: Secondary | ICD-10-CM | POA: Diagnosis not present

## 2019-06-24 DIAGNOSIS — K219 Gastro-esophageal reflux disease without esophagitis: Secondary | ICD-10-CM | POA: Insufficient documentation

## 2019-06-24 DIAGNOSIS — Z8673 Personal history of transient ischemic attack (TIA), and cerebral infarction without residual deficits: Secondary | ICD-10-CM | POA: Insufficient documentation

## 2019-06-24 DIAGNOSIS — E1139 Type 2 diabetes mellitus with other diabetic ophthalmic complication: Secondary | ICD-10-CM | POA: Diagnosis not present

## 2019-06-24 DIAGNOSIS — Z9981 Dependence on supplemental oxygen: Secondary | ICD-10-CM | POA: Insufficient documentation

## 2019-06-24 DIAGNOSIS — Z79899 Other long term (current) drug therapy: Secondary | ICD-10-CM | POA: Diagnosis not present

## 2019-06-24 DIAGNOSIS — J449 Chronic obstructive pulmonary disease, unspecified: Secondary | ICD-10-CM | POA: Diagnosis not present

## 2019-06-24 DIAGNOSIS — M1990 Unspecified osteoarthritis, unspecified site: Secondary | ICD-10-CM | POA: Diagnosis not present

## 2019-06-24 DIAGNOSIS — Z87891 Personal history of nicotine dependence: Secondary | ICD-10-CM | POA: Insufficient documentation

## 2019-06-24 DIAGNOSIS — Z7984 Long term (current) use of oral hypoglycemic drugs: Secondary | ICD-10-CM | POA: Insufficient documentation

## 2019-06-24 DIAGNOSIS — E7800 Pure hypercholesterolemia, unspecified: Secondary | ICD-10-CM | POA: Diagnosis not present

## 2019-06-24 DIAGNOSIS — H42 Glaucoma in diseases classified elsewhere: Secondary | ICD-10-CM | POA: Insufficient documentation

## 2019-06-24 DIAGNOSIS — H2511 Age-related nuclear cataract, right eye: Secondary | ICD-10-CM | POA: Diagnosis not present

## 2019-06-24 DIAGNOSIS — Z9842 Cataract extraction status, left eye: Secondary | ICD-10-CM | POA: Diagnosis not present

## 2019-06-24 HISTORY — PX: CATARACT EXTRACTION W/PHACO: SHX586

## 2019-06-24 LAB — GLUCOSE, CAPILLARY: Glucose-Capillary: 93 mg/dL (ref 70–99)

## 2019-06-24 SURGERY — PHACOEMULSIFICATION, CATARACT, WITH IOL INSERTION
Anesthesia: Monitor Anesthesia Care | Site: Eye | Laterality: Right

## 2019-06-24 MED ORDER — PROVISC 10 MG/ML IO SOLN
INTRAOCULAR | Status: DC | PRN
  Administered 2019-06-24: 0.85 mL via INTRAOCULAR

## 2019-06-24 MED ORDER — LIDOCAINE HCL (PF) 1 % IJ SOLN
INTRAOCULAR | Status: DC | PRN
  Administered 2019-06-24: 10:00:00 1 mL via OPHTHALMIC

## 2019-06-24 MED ORDER — PHENYLEPHRINE HCL 2.5 % OP SOLN
1.0000 [drp] | OPHTHALMIC | Status: AC | PRN
  Administered 2019-06-24 (×3): 1 [drp] via OPHTHALMIC

## 2019-06-24 MED ORDER — TETRACAINE HCL 0.5 % OP SOLN
1.0000 [drp] | OPHTHALMIC | Status: AC | PRN
  Administered 2019-06-24 (×3): 1 [drp] via OPHTHALMIC

## 2019-06-24 MED ORDER — LIDOCAINE HCL 3.5 % OP GEL
1.0000 "application " | Freq: Once | OPHTHALMIC | Status: AC
  Administered 2019-06-24: 1 via OPHTHALMIC

## 2019-06-24 MED ORDER — PROMETHAZINE HCL 25 MG/ML IJ SOLN: 6.2500 mg | INTRAMUSCULAR | Status: DC | PRN

## 2019-06-24 MED ORDER — POVIDONE-IODINE 5 % OP SOLN
OPHTHALMIC | Status: DC | PRN
  Administered 2019-06-24: 1 via OPHTHALMIC

## 2019-06-24 MED ORDER — SODIUM HYALURONATE 23 MG/ML IO SOLN
INTRAOCULAR | Status: DC | PRN
  Administered 2019-06-24: 0.6 mL via INTRAOCULAR

## 2019-06-24 MED ORDER — BSS IO SOLN
INTRAOCULAR | Status: DC | PRN
  Administered 2019-06-24: 15 mL via INTRAOCULAR

## 2019-06-24 MED ORDER — NEOMYCIN-POLYMYXIN-DEXAMETH 3.5-10000-0.1 OP SUSP
OPHTHALMIC | Status: DC | PRN
  Administered 2019-06-24: 1 [drp] via OPHTHALMIC

## 2019-06-24 MED ORDER — MIDAZOLAM HCL 2 MG/2ML IJ SOLN: 0.5000 mg | Freq: Once | INTRAMUSCULAR | Status: DC | PRN

## 2019-06-24 MED ORDER — LACTATED RINGERS IV SOLN: INTRAVENOUS | Status: DC

## 2019-06-24 MED ORDER — CYCLOPENTOLATE-PHENYLEPHRINE 0.2-1 % OP SOLN
1.0000 [drp] | OPHTHALMIC | Status: AC | PRN
  Administered 2019-06-24 (×3): 1 [drp] via OPHTHALMIC

## 2019-06-24 MED ORDER — EPINEPHRINE PF 1 MG/ML IJ SOLN
INTRAOCULAR | Status: DC | PRN
  Administered 2019-06-24: 500 mL

## 2019-06-24 SURGICAL SUPPLY — 12 items

## 2019-06-24 NOTE — Discharge Instructions (Signed)
Please discharge patient when stable, will follow up today with Dr. Elayah Klooster at the [facility] [place] office immediately following discharge.  Leave shield in place until visit.  All paperwork with discharge instructions will be given at the office.  [facility] Address:  [address]  [place], [place] [address]  

## 2019-06-24 NOTE — Transfer of Care (Signed)
Immediate Anesthesia Transfer of Care Note  Patient: Jeffery [name]  Procedure(s) Performed: CATARACT EXTRACTION PHACO AND INTRAOCULAR LENS PLACEMENT (IOC) (Right Eye)  Patient Location: PACU  Anesthesia Type:MAC  Level of Consciousness: awake  Airway & Oxygen Therapy: Patient Spontanous Breathing  Post-op Assessment: Report given to RN, Post -op Vital signs reviewed and stable and Patient moving all extremities X 4  Post vital signs: Reviewed and stable  Last Vitals:  Vitals Value Taken Time  BP    Temp    Pulse    Resp    SpO2      Last Pain:  Vitals:   06/24/19 0923  TempSrc: Oral         Complications: No apparent anesthesia complications

## 2019-06-24 NOTE — Op Note (Signed)
Date of procedure: 06/24/19  Pre-operative diagnosis: Visually significant age-related nuclear cataract, Right Eye (H25.11)  Post-operative diagnosis: Visually significant age-related nuclear cataract, Right Eye  Procedure: Removal of cataract via phacoemulsification and insertion of intra-ocular lens Wynetta Emery and [place]  +13.5D into the capsular bag of the Right Eye  Attending surgeon: Gerda Diss. Monish Haliburton, MD, MA  Anesthesia: MAC, Topical Akten  Complications: None  Estimated Blood Loss: <82m (minimal)  Specimens: None  Implants: As above  Indications:  Visually significant age-related cataract, Right Eye  Procedure:  The patient was seen and identified in the pre-operative area. The operative eye was identified and dilated.  The operative eye was marked.  Topical anesthesia was administered to the operative eye.     The patient was then to the operative suite and placed in the supine position.  A timeout was performed confirming the patient, procedure to be performed, and all other relevant information.   The patient's face was prepped and draped in the usual fashion for intra-ocular surgery.  A lid speculum was placed into the operative eye and the surgical microscope moved into place and focused.  A superotemporal paracentesis was created using a 20 gauge paracentesis blade.  Shugarcaine was injected into the anterior chamber.  Viscoelastic was injected into the anterior chamber.  A temporal clear-corneal main wound incision was created using a 2.478mmicrokeratome.  A continuous curvilinear capsulorrhexis was initiated using an irrigating cystitome and completed using capsulorrhexis forceps.  Hydrodissection and hydrodeliniation were performed.  Viscoelastic was injected into the anterior chamber.  A phacoemulsification handpiece and a chopper as a second instrument were used to remove the nucleus and epinucleus. The irrigation/aspiration handpiece was used to remove any  remaining cortical material.   The capsular bag was reinflated with viscoelastic, checked, and found to be intact.  The intraocular lens was inserted into the capsular bag.  The irrigation/aspiration handpiece was used to remove any remaining viscoelastic.  The clear corneal wound and paracentesis wounds were then hydrated and checked with Weck-Cels to be watertight.  The lid-speculum and drape was removed, and the patient's face was cleaned with a wet and dry 4x4.  Maxitrol was instilled in the eye before a clear shield was taped over the eye. The patient was taken to the post-operative care unit in good condition, having tolerated the procedure well.  Post-Op Instructions: [facility] and will receive all other orders and instructions.

## 2019-06-24 NOTE — Anesthesia Postprocedure Evaluation (Signed)
Anesthesia Post Note  Patient: Jeffery [name]  Procedure(s) Performed: CATARACT EXTRACTION PHACO AND INTRAOCULAR LENS PLACEMENT (IOC) (Right Eye)  Patient location during evaluation: PACU Anesthesia Type: MAC Level of consciousness: awake and alert Pain management: pain level controlled Vital Signs Assessment: post-procedure vital signs reviewed and stable Respiratory status: spontaneous breathing, nonlabored ventilation, respiratory function stable and patient connected to nasal cannula oxygen Cardiovascular status: stable and blood pressure returned to baseline Postop Assessment: no apparent nausea or vomiting Anesthetic complications: no     Last Vitals:  Vitals:   06/24/19 0923 06/24/19 1015  BP: 97/65 114/69  Pulse: 86 85  Resp: 20 18  Temp: 36.7 C 37.1 C  SpO2: 100% 100%    Last Pain:  Vitals:   06/24/19 1015  TempSrc: Oral  PainSc: 0-No pain                 Talitha Givens

## 2019-06-24 NOTE — Anesthesia Preprocedure Evaluation (Signed)
Anesthesia Evaluation  Patient identified by MRN, date of birth, ID band Patient awake    Reviewed: Allergy & Precautions, NPO status , Patient's Chart, lab work & pertinent test results  Airway Mallampati: II  TM Distance: >3 FB Neck ROM: Full    Dental no notable dental hx. (+) Missing   Pulmonary pneumonia, resolved, COPD,  oxygen dependent, former smoker,    Pulmonary exam normal breath sounds clear to auscultation       Cardiovascular Exercise Tolerance: Poor hypertension, Pt. on medications negative cardio ROS Normal cardiovascular examII Rhythm:Regular Rate:Normal     Neuro/Psych negative neurological ROS  negative psych ROS   GI/Hepatic Neg liver ROS, GERD  Medicated and Controlled,  Endo/Other  negative endocrine ROS  Renal/GU negative Renal ROS  negative genitourinary   Musculoskeletal negative musculoskeletal ROS (+)   Abdominal   Peds negative pediatric ROS (+)  Hematology negative hematology ROS (+)   Anesthesia Other Findings   Reproductive/Obstetrics negative OB ROS                             Anesthesia Physical Anesthesia Plan  ASA: III  Anesthesia Plan: MAC   Post-op Pain Management:    Induction: Intravenous  PONV Risk Score and Plan: 1 and TIVA and Treatment may vary due to age or medical condition  Airway Management Planned: Nasal Cannula  Additional Equipment:   Intra-op Plan:   Post-operative Plan:   Informed Consent: I have reviewed the patients History and Physical, chart, labs and discussed the procedure including the risks, benefits and alternatives for the proposed anesthesia with the patient or authorized representative who has indicated his/her understanding and acceptance.     Dental advisory given  Plan Discussed with: CRNA  Anesthesia Plan Comments: (Plan Full PPE use  Plan MAC -WTP with same after Q&A  Second IOL - first 06/10/19-  wants like last time )        Anesthesia Quick Evaluation

## 2019-06-24 NOTE — Interval H&P Note (Signed)
History and Physical Interval Note: The H and P was reviewed and updated. The patient was examined.  No changes were found after exam.  The surgical eye was marked.  06/24/2019 9:51 AM  [name]  has presented today for surgery, with the diagnosis of Nuclear sclerotic cataract - Right eye.  The various methods of treatment have been discussed with the patient and family. After consideration of risks, benefits and other options for treatment, the patient has consented to  Procedure(s) with comments: CATARACT EXTRACTION PHACO AND INTRAOCULAR LENS PLACEMENT (IOC) (Right) - right as a surgical intervention.  The patient's history has been reviewed, patient examined, no change in status, stable for surgery.  I have reviewed the patient's chart and labs.  Questions were answered to the patient's satisfaction.     Jeffery [name]

## 2019-06-27 DIAGNOSIS — M818 Other osteoporosis without current pathological fracture: Secondary | ICD-10-CM | POA: Diagnosis not present

## 2019-06-27 DIAGNOSIS — J449 Chronic obstructive pulmonary disease, unspecified: Secondary | ICD-10-CM | POA: Diagnosis not present

## 2019-06-27 DIAGNOSIS — Z0000 Encounter for general adult medical examination without abnormal findings: Secondary | ICD-10-CM | POA: Diagnosis not present

## 2019-06-27 DIAGNOSIS — I10 Essential (primary) hypertension: Secondary | ICD-10-CM | POA: Diagnosis not present

## 2019-06-27 DIAGNOSIS — E119 Type 2 diabetes mellitus without complications: Secondary | ICD-10-CM | POA: Diagnosis not present

## 2019-06-27 DIAGNOSIS — K219 Gastro-esophageal reflux disease without esophagitis: Secondary | ICD-10-CM | POA: Diagnosis not present

## 2019-07-02 DIAGNOSIS — J9611 Chronic respiratory failure with hypoxia: Secondary | ICD-10-CM | POA: Diagnosis not present

## 2019-07-02 DIAGNOSIS — J449 Chronic obstructive pulmonary disease, unspecified: Secondary | ICD-10-CM | POA: Diagnosis not present

## 2019-07-08 DIAGNOSIS — J449 Chronic obstructive pulmonary disease, unspecified: Secondary | ICD-10-CM | POA: Diagnosis not present

## 2019-07-13 ENCOUNTER — Ambulatory Visit: Payer: Medicare Other | Attending: Internal Medicine

## 2019-07-13 DIAGNOSIS — Z23 Encounter for immunization: Secondary | ICD-10-CM

## 2019-07-13 NOTE — Progress Notes (Signed)
   Covid-19 Vaccination Clinic  Name:  Jeffery [name]    MRN: [id number] DOB: Dec 05, 1950  07/13/2019  [name] was observed post Covid-19 immunization for 15 minutes without incident. He was provided with Vaccine Information Sheet and instruction to access the V-Safe system.   [name] was instructed to call 911 with any severe reactions post vaccine: Marland Kitchen Difficulty breathing  . Swelling of face and throat  . A fast heartbeat  . A bad rash all over body  . Dizziness and weakness   Immunizations Administered    Name Date Dose VIS Date Route   Moderna COVID-19 Vaccine 07/13/2019 11:24 AM 0.5 mL 04/02/2019 Intramuscular   Manufacturer: Moderna   Lot: [id number]   NDC: 85501-586-82

## 2019-07-23 ENCOUNTER — Other Ambulatory Visit: Payer: Self-pay

## 2019-07-23 NOTE — Patient Outreach (Signed)
[facility]) Care Management  07/23/2019  MAAN ZARCONE 06-01-1950 [id number]   Medication Adherence call to Mr. Latrelle Bazar HIPPA Compliant Voice message left with a call back number. Mr. Maniaci is showing past due on Telmisartan 40 mg under [employer].   Lillia Abed CPhT Pharmacy Technician [facility] Care Management Direct Dial [phone]  Fax [phone] [email or web address]

## 2019-07-26 ENCOUNTER — Other Ambulatory Visit: Payer: Self-pay

## 2019-07-26 ENCOUNTER — Encounter (HOSPITAL_COMMUNITY): Payer: Self-pay | Admitting: Emergency Medicine

## 2019-07-26 ENCOUNTER — Emergency Department (HOSPITAL_COMMUNITY)
Admission: EM | Admit: 2019-07-26 | Discharge: 2019-07-26 | Disposition: A | Discharge status: Home / Self Care | Payer: Medicare Other | Attending: Emergency Medicine | Admitting: Emergency Medicine

## 2019-07-26 ENCOUNTER — Emergency Department (HOSPITAL_COMMUNITY): Payer: Medicare Other

## 2019-07-26 DIAGNOSIS — Z79899 Other long term (current) drug therapy: Secondary | ICD-10-CM | POA: Insufficient documentation

## 2019-07-26 DIAGNOSIS — I10 Essential (primary) hypertension: Secondary | ICD-10-CM | POA: Diagnosis not present

## 2019-07-26 DIAGNOSIS — R0602 Shortness of breath: Secondary | ICD-10-CM

## 2019-07-26 DIAGNOSIS — Z7982 Long term (current) use of aspirin: Secondary | ICD-10-CM | POA: Diagnosis not present

## 2019-07-26 DIAGNOSIS — Z87891 Personal history of nicotine dependence: Secondary | ICD-10-CM | POA: Insufficient documentation

## 2019-07-26 DIAGNOSIS — Z7984 Long term (current) use of oral hypoglycemic drugs: Secondary | ICD-10-CM | POA: Diagnosis not present

## 2019-07-26 DIAGNOSIS — J441 Chronic obstructive pulmonary disease with (acute) exacerbation: Secondary | ICD-10-CM

## 2019-07-26 LAB — CBC WITH DIFFERENTIAL/PLATELET
Abs Immature Granulocytes: 0.02 10*3/uL (ref 0.00–0.07)
Basophils Absolute: 0 10*3/uL (ref 0.0–0.1)
Basophils Relative: 0 %
Eosinophils Absolute: 0.3 10*3/uL (ref 0.0–0.5)
Eosinophils Relative: 3 %
HCT: 33.1 % — ABNORMAL LOW (ref 39.0–52.0)
Hemoglobin: 11 g/dL — ABNORMAL LOW (ref 13.0–17.0)
Immature Granulocytes: 0 %
Lymphocytes Relative: 6 %
Lymphs Abs: 0.5 10*3/uL — ABNORMAL LOW (ref 0.7–4.0)
MCH: 29.9 pg (ref 26.0–34.0)
MCHC: 33.2 g/dL (ref 30.0–36.0)
MCV: 89.9 fL (ref 80.0–100.0)
Monocytes Absolute: 0.5 10*3/uL (ref 0.1–1.0)
Monocytes Relative: 6 %
Neutro Abs: 7.4 10*3/uL (ref 1.7–7.7)
Neutrophils Relative %: 85 %
Platelets: 342 10*3/uL (ref 150–400)
RBC: 3.68 MIL/uL — ABNORMAL LOW (ref 4.22–5.81)
RDW: 13.3 % (ref 11.5–15.5)
WBC: 8.7 10*3/uL (ref 4.0–10.5)
nRBC: 0 % (ref 0.0–0.2)

## 2019-07-26 LAB — BASIC METABOLIC PANEL
Anion gap: 10 (ref 5–15)
BUN: 12 mg/dL (ref 8–23)
CO2: 31 mmol/L (ref 22–32)
Calcium: 9.1 mg/dL (ref 8.9–10.3)
Chloride: 89 mmol/L — ABNORMAL LOW (ref 98–111)
Creatinine, Ser: 0.54 mg/dL — ABNORMAL LOW (ref 0.61–1.24)
GFR calc Af Amer: >60 mL/min (ref >60)
GFR calc non Af Amer: >60 mL/min (ref >60)
Glucose, Bld: 120 mg/dL — ABNORMAL HIGH (ref 70–99)
Potassium: 3.4 mmol/L — ABNORMAL LOW (ref 3.5–5.1)
Sodium: 130 mmol/L — ABNORMAL LOW (ref 135–145)

## 2019-07-26 MED ORDER — PREDNISONE 20 MG PO TABS: 40.0000 mg | ORAL_TABLET | Freq: Every day | ORAL | 0 refills | Status: AC

## 2019-07-26 MED ORDER — IPRATROPIUM-ALBUTEROL 0.5-2.5 (3) MG/3ML IN SOLN: 3.0000 mL | Freq: Once | RESPIRATORY_TRACT | Status: DC

## 2019-07-26 MED ORDER — ALBUTEROL SULFATE HFA 108 (90 BASE) MCG/ACT IN AERS
INHALATION_SPRAY | RESPIRATORY_TRACT | Status: AC
  Filled 2019-07-26: qty 6.7

## 2019-07-26 MED ORDER — ALBUTEROL SULFATE HFA 108 (90 BASE) MCG/ACT IN AERS
8.0000 | INHALATION_SPRAY | Freq: Once | RESPIRATORY_TRACT | Status: AC
  Administered 2019-07-26: 8 via RESPIRATORY_TRACT

## 2019-07-26 MED ORDER — POTASSIUM CHLORIDE CRYS ER 20 MEQ PO TBCR
20.0000 meq | EXTENDED_RELEASE_TABLET | Freq: Once | ORAL | Status: AC
  Administered 2019-07-26: 20 meq via ORAL
  Filled 2019-07-26: qty 1

## 2019-07-26 MED ORDER — PREDNISONE 50 MG PO TABS
60.0000 mg | ORAL_TABLET | Freq: Once | ORAL | Status: AC
  Administered 2019-07-26: 60 mg via ORAL
  Filled 2019-07-26: qty 1

## 2019-07-26 NOTE — ED Notes (Signed)
Pt speaks in complete sentences without stopping for breath, and continues to speak without any resp distress  He reports he was told to have O2/2L on at all times several months ago  Is difficult to ascertain if pt felt worse today - he states he came because his wife was upset with him  Standing orders started

## 2019-07-26 NOTE — BH Assessment (Signed)
Per resp when called, they must have 2 negative covid test to give a treatment   Suggest hits off inhaler instead  resp informed that pt has had first covid vaccine

## 2019-07-26 NOTE — ED Provider Notes (Addendum)
[facility] EMERGENCY DEPARTMENT Provider Note   CSN: [id number] Arrival date & time: 07/26/19  1233     History Chief Complaint  Patient presents with  . Shortness of Breath    x 4 days    Jeffery [name] is a 69 y.o. male with PMH of HTN, COPD on 2L home O2, HLD, GERD here with worsening dyspnea on exertion for the past 3-4 days.  Patient reports that he has oxygen at home that allows him to walk around the house and when he has been going from the bedroom to the bathroom he has been needing to sit down and use his treatments or take time to catch his breath which is out of the norm for him.  He states that he follows with pulmonology who has recently increased his duo nebs to twice daily.  He denies smoking.  He denies any leg swelling, fevers, chills, sick contacts.  Reports that he had his first dose of the Covid vaccine earlier this month.    Past Medical History:  Diagnosis Date  . Chest pain 04/2011   Normal echocardiogram  . COPD (chronic obstructive pulmonary disease) (HCC)   . Fasting hyperglycemia    Normal hemoglobin A1c of 6.0  . GERD (gastroesophageal reflux disease)   . Hyperlipemia   . Hypertension   . On home O2    2L N/C  . On home O2    at night  . Prediabetes 06/04/2015  . Tobacco abuse     Patient Active Problem List   Diagnosis Date Noted  . Weight loss 01/28/2019  . COPD  GOLD ?  11/04/2018  . Chronic respiratory failure with hypoxia (HCC) 10/04/2018  . Right middle lobe pneumonia 11/07/2016  . Acute on chronic respiratory failure with hypoxia (HCC) 11/07/2016  . Prediabetes 06/04/2015  . Hyperglycemia, drug-induced 06/03/2015  . Essential hypertension 06/03/2015  . COPD exacerbation (HCC) 06/02/2015  . Hyperlipidemia 07/20/2011  . Fasting hyperglycemia   . Tobacco abuse   . Chest pain 04/02/2011    Past Surgical History:  Procedure Laterality Date  . APPENDECTOMY    . APPENDECTOMY    . CATARACT EXTRACTION W/PHACO Left 06/10/2019   Procedure: CATARACT EXTRACTION PHACO AND INTRAOCULAR LENS PLACEMENT (IOC);  Surgeon: Fabio Pierce, MD;  Location: [facility] ORS;  Service: Ophthalmology;  Laterality: Left;  CDE: 3.73  . CATARACT EXTRACTION W/PHACO Right 06/24/2019   Procedure: CATARACT EXTRACTION PHACO AND INTRAOCULAR LENS PLACEMENT (IOC);  Surgeon: Fabio Pierce, MD;  Location: [facility];  Service: Ophthalmology;  Laterality: Right;  CDE: 4.42  . LEFT HEART CATHETERIZATION WITH CORONARY ANGIOGRAM N/A 05/17/2011   Procedure: LEFT HEART CATHETERIZATION WITH CORONARY ANGIOGRAM;  Surgeon: Tonny Bollman, MD;  Location: [facility] CATH LAB;  Service: Cardiovascular;  Laterality: N/A;      Family History  Problem Relation Age of Onset  . Emphysema Father     Social History   Tobacco Use  . Smoking status: Former Smoker    Packs/day: 1.00    Years: 35.00    Pack years: 35.00    Types: Cigarettes    Quit date: 05/09/2013    Years since quitting: 6.2  . Smokeless tobacco: Never Used  Substance Use Topics  . Alcohol use: No  . Drug use: No    Home Medications Prior to Admission medications   Medication Sig Start Date End Date Taking? Authorizing Provider  albuterol (PROAIR HFA) 108 (90 Base) MCG/ACT inhaler Inhale 1-2 puffs into the lungs every 4 (four)  hours as needed. Patient taking differently: Inhale 1-2 puffs into the lungs every 4 (four) hours as needed for wheezing or shortness of breath.  11/16/18   Nyoka Cowden, MD  ALPRAZolam Prudy Feeler) 0.5 MG tablet Take 0.5 mg by mouth at bedtime.    [provider]  aspirin EC 81 MG tablet Take 81 mg by mouth daily.    [provider]  budesonide (PULMICORT) 0.25 MG/2ML nebulizer solution Take 2 mLs (0.25 mg total) by nebulization 2 (two) times daily. 02/15/19   Nyoka Cowden, MD  chlorthalidone (HYGROTON) 25 MG tablet Take 12.5 mg by mouth daily.     [provider]  famotidine (PEPCID) 40 MG tablet 1 daily after supper Patient taking differently: Take 40 mg by  mouth daily after supper.  10/03/18   Nyoka Cowden, MD  gabapentin (NEURONTIN) 300 MG capsule Take 300 mg by mouth 3 (three) times daily.  03/22/13   [provider]  ipratropium-albuterol (DUONEB) 0.5-2.5 (3) MG/3ML SOLN USE 1 VIAL VIA NEBULIZER 4 TIMES DAILY. 06/24/19   Nyoka Cowden, MD  isosorbide mononitrate (IMDUR) 30 MG 24 hr tablet Take 30 mg by mouth daily. 03/22/14   [provider]  MATZIM LA 240 MG 24 hr tablet Take 240 mg by mouth daily. 04/16/19   [provider]  metFORMIN (GLUCOPHAGE) 500 MG tablet Take 500 mg by mouth daily.    [provider]  Multiple Vitamin (MULTIVITAMIN WITH MINERALS) TABS tablet Take 1 tablet by mouth daily.    [provider]  omeprazole (PRILOSEC) 20 MG capsule Take 20 mg by mouth daily.    [provider]  OXYGEN 2lpm 24/7    [provider]  potassium chloride SA (K-DUR,KLOR-CON) 20 MEQ tablet Take 20 mEq by mouth daily.  05/12/14   [provider]  predniSONE (DELTASONE) 5 MG tablet Prednisone is 20 mg per day  Until  (5mg  x 4 pills) until better then taper to 5 mg  - if worse go back up Patient taking differently: Take 5 mg by mouth daily with breakfast. Take 1 tablet (5 mg) by mouth scheduled each day, may increase to 20 mg if needed for respiratory issues 04/29/19   05/01/19, MD  simvastatin (ZOCOR) 20 MG tablet Take 20 mg by mouth at bedtime.  03/22/14   [provider]  sucralfate (CARAFATE) 1 g tablet Take 1 g by mouth 2 (two) times daily.  07/19/18   [provider]  telmisartan (MICARDIS) 40 MG tablet Take 1 tablet (40 mg total) by mouth daily. 10/03/18   12/03/18, MD    Allergies    Codeine  Review of Systems   Review of Systems  Constitutional: Negative for chills and fever.  HENT: Negative for congestion and sore throat.   Eyes: Negative for visual disturbance.  Respiratory: Positive for shortness of breath and wheezing. Negative for  cough and chest tightness.   Cardiovascular: Negative for chest pain, palpitations and leg swelling.  Gastrointestinal: Negative for abdominal pain.  Genitourinary: Negative for difficulty urinating.  Neurological: Negative for dizziness, weakness, light-headedness and headaches.    Physical Exam Updated Vital Signs BP (!) 124/92   Pulse 78   Temp 98.6 F (37 C) (Oral)   Resp 18   Ht 5\' 6"  (1.676 m)   Wt 49.4 kg   SpO2 100%   BMI 17.59 kg/m   Physical Exam Vitals and nursing note reviewed.  Constitutional:  General: He is not in acute distress.    Appearance: He is well-developed and normal weight.  HENT:     Head: Normocephalic and atraumatic.  Eyes:     Extraocular Movements: Extraocular movements intact.     Pupils: Pupils are equal, round, and reactive to light.  Cardiovascular:     Rate and Rhythm: Normal rate and regular rhythm.  Pulmonary:     Effort: Tachypnea and accessory muscle usage present. No respiratory distress.     Breath sounds: Examination of the right-upper field reveals wheezing. Examination of the left-upper field reveals wheezing. Examination of the right-lower field reveals decreased breath sounds. Examination of the left-lower field reveals decreased breath sounds. Decreased breath sounds and wheezing present. No rhonchi or rales.  Chest:     Chest wall: No tenderness.  Musculoskeletal:        General: Normal range of motion.     Cervical back: Normal range of motion and neck supple.     Right lower leg: No tenderness. No edema.     Left lower leg: No tenderness. No edema.  Skin:    Capillary Refill: Capillary refill takes less than 2 seconds.  Neurological:     General: No focal deficit present.     Mental Status: He is alert.  Psychiatric:        Mood and Affect: Mood normal.        Behavior: Behavior normal.    ED Results / Procedures / Treatments   Labs (all labs ordered are listed, but only abnormal results are displayed) Labs  Reviewed  CBC WITH DIFFERENTIAL/PLATELET - Abnormal; Notable for the following components:      Result Value   RBC 3.68 (*)    Hemoglobin 11.0 (*)    HCT 33.1 (*)    Lymphs Abs 0.5 (*)    All other components within normal limits  BASIC METABOLIC PANEL - Abnormal; Notable for the following components:   Sodium 130 (*)    Potassium 3.4 (*)    Chloride 89 (*)    Glucose, Bld 120 (*)    Creatinine, Ser 0.54 (*)    All other components within normal limits    EKG None  Radiology DG Chest Port 1 View  Result Date: 07/26/2019 CLINICAL DATA:  Shortness of breath for 4 days, history COPD, home oxygen, hypertension EXAM: PORTABLE CHEST 1 VIEW COMPARISON:  Portable exam 1302 hours compared to 04/29/2019 FINDINGS: Normal heart size, mediastinal contours, and pulmonary vascularity. Atherosclerotic calcification aorta. Emphysematous changes with RIGHT basilar and LEFT apical scarring. No definite acute infiltrate, pleural effusion or pneumothorax. Bones unremarkable. IMPRESSION: Changes of COPD with RIGHT basilar and LEFT apical scarring. No acute abnormalities. Electronically Signed   By: Ulyses Southward M.D.   On: 07/26/2019 13:16    Procedures Procedures (including critical care time)  Medications Ordered in ED Medications  potassium chloride SA (KLOR-CON) CR tablet 20 mEq (has no administration in time range)  predniSONE (DELTASONE) tablet 60 mg (60 mg Oral Given 07/26/19 1446)  albuterol (VENTOLIN HFA) 108 (90 Base) MCG/ACT inhaler 8 puff (8 puffs Inhalation Given 07/26/19 1446)    ED Course  I have reviewed the triage vital signs and the nursing notes.  Pertinent labs & imaging results that were available during my care of the patient were reviewed by me and considered in my medical decision making (see chart for details).    MDM Rules/Calculators/A&P  69 yo male with COPD on 2L home O2 who is coming in with worsening DoE over the past 3-4 days.  He denies any  chest pain, chest tightness, leg swelling, cough. Patient reports that he has been using his albuterol a few more times per day than usual as well as using his duo nebs twice daily.  Patient satting 100% on home O2 of 2 L but is using more accessory muscles and is tachypneic on exam.  No crackles noted on exam but with limited breath sounds in bilateral bases and wheezing in upper bases.  CXR with bibasilar and apical scarring 2/2 COPD with no signs of acute infection.   Will treat patient with 60 mg prednisone as well as 8 puffs albuterol. Given lack of productive cough and CXR with no signs of pneumonia, will not initiate antibiotics at this time.  Given patient's chronic illness unclear if this is much off patient's baseline but he does report that he is unable to ambulate from his bedroom to his bathroom without being short of breath which is new for him. Could be worsening COPD, less likely PE given no tachycardia, no signs of CHF, no signs of pna or acute infection.   Patient desatted to only 92% on home O2 with ambulation and was having decreased work of breathing after albuterol and steroid treatment. Will send home with 4 days of steroid burst with 40mg  of prednisone (5days total). Patient to use nebulizers and albuterol inhaler at home every 6 hours for the next few days. Strict return precautions discussed and patient voiced understanding. Will need to follow up with regular doctor.   Jeffery [name] was evaluated in Emergency Department on 07/26/2019 for the symptoms described in the history of present illness. He was evaluated in the context of the global COVID-19 pandemic, which necessitated consideration that the patient might be at risk for infection with the SARS-CoV-2 virus that causes COVID-19. Institutional protocols and algorithms that pertain to the evaluation of patients at risk for COVID-19 are in a state of rapid change based on information released by regulatory bodies including the  CDC and federal and state organizations. These policies and algorithms were followed during the patient's care in the ED. Final Clinical Impression(s) / ED Diagnoses Final diagnoses:  SOB (shortness of breath)    Rx / DC Orders ED Discharge Orders    None       Jamelyn Bovard, [place], DO 07/26/19 [place], [place], DO 07/26/19 [place], [place], DO 07/26/19 [place], [place], DO 07/26/19 1553    Elnora Morrison, MD 07/27/19 [phone]

## 2019-07-26 NOTE — Discharge Instructions (Signed)
Please follow up with your regular doctor. We will give you a steroid burst for 5 days total. Please take 40mg  of prednisone in addition to your 5mg  at home each day for 4 days. Please use your albuterol and your nebulizer at home for the next 4 days every 6 hours. If you develop worsening shortness of breath, chest pain or productive cough, please return as you may need further treatment.

## 2019-07-26 NOTE — ED Triage Notes (Signed)
On home O2/4L  Complains of SOB x 4 days   Has not contacted MD  Here for eval

## 2019-07-26 NOTE — ED Notes (Signed)
Pt ambulated down the hall with his home O2  His sats were steady at 92 per cent  His pulse steady at 92 also

## 2019-08-02 DIAGNOSIS — J9611 Chronic respiratory failure with hypoxia: Secondary | ICD-10-CM | POA: Diagnosis not present

## 2019-08-02 DIAGNOSIS — J449 Chronic obstructive pulmonary disease, unspecified: Secondary | ICD-10-CM | POA: Diagnosis not present

## 2019-08-06 DIAGNOSIS — J449 Chronic obstructive pulmonary disease, unspecified: Secondary | ICD-10-CM | POA: Diagnosis not present

## 2019-08-08 DIAGNOSIS — J440 Chronic obstructive pulmonary disease with acute lower respiratory infection: Secondary | ICD-10-CM | POA: Diagnosis not present

## 2019-08-08 DIAGNOSIS — R0602 Shortness of breath: Secondary | ICD-10-CM | POA: Diagnosis not present

## 2019-08-08 DIAGNOSIS — J441 Chronic obstructive pulmonary disease with (acute) exacerbation: Secondary | ICD-10-CM | POA: Diagnosis not present

## 2019-08-08 DIAGNOSIS — Z7984 Long term (current) use of oral hypoglycemic drugs: Secondary | ICD-10-CM | POA: Diagnosis not present

## 2019-08-08 DIAGNOSIS — J189 Pneumonia, unspecified organism: Secondary | ICD-10-CM | POA: Diagnosis not present

## 2019-08-08 DIAGNOSIS — E871 Hypo-osmolality and hyponatremia: Secondary | ICD-10-CM | POA: Diagnosis not present

## 2019-08-08 DIAGNOSIS — E1140 Type 2 diabetes mellitus with diabetic neuropathy, unspecified: Secondary | ICD-10-CM | POA: Diagnosis not present

## 2019-08-08 DIAGNOSIS — R778 Other specified abnormalities of plasma proteins: Secondary | ICD-10-CM | POA: Diagnosis not present

## 2019-08-08 DIAGNOSIS — Z20822 Contact with and (suspected) exposure to covid-19: Secondary | ICD-10-CM | POA: Diagnosis not present

## 2019-08-08 DIAGNOSIS — Z87891 Personal history of nicotine dependence: Secondary | ICD-10-CM | POA: Diagnosis not present

## 2019-08-08 DIAGNOSIS — K449 Diaphragmatic hernia without obstruction or gangrene: Secondary | ICD-10-CM | POA: Diagnosis not present

## 2019-08-08 DIAGNOSIS — I10 Essential (primary) hypertension: Secondary | ICD-10-CM | POA: Diagnosis not present

## 2019-08-08 DIAGNOSIS — I252 Old myocardial infarction: Secondary | ICD-10-CM | POA: Diagnosis not present

## 2019-08-14 ENCOUNTER — Ambulatory Visit: Payer: Medicare Other | Attending: Internal Medicine

## 2019-08-14 DIAGNOSIS — Z23 Encounter for immunization: Secondary | ICD-10-CM

## 2019-08-14 NOTE — Progress Notes (Signed)
   Covid-19 Vaccination Clinic  Name:  IZAAC REISIG    MRN: [id number] DOB: 12-May-1950  08/14/2019  Mr. Pannone was observed post Covid-19 immunization for 15 minutes without incident. He was provided with Vaccine Information Sheet and instruction to access the V-Safe system.   Mr. Pinson was instructed to call 911 with any severe reactions post vaccine: Marland Kitchen Difficulty breathing  . Swelling of face and throat  . A fast heartbeat  . A bad rash all over body  . Dizziness and weakness   Immunizations Administered    Name Date Dose VIS Date Route   Moderna COVID-19 Vaccine 08/14/2019  9:40 AM 0.5 mL 04/02/2019 Intramuscular   Manufacturer: Moderna   Lot: [id number]   NDC: 49201-007-12

## 2019-08-19 DIAGNOSIS — E871 Hypo-osmolality and hyponatremia: Secondary | ICD-10-CM | POA: Diagnosis not present

## 2019-08-19 DIAGNOSIS — J168 Pneumonia due to other specified infectious organisms: Secondary | ICD-10-CM | POA: Diagnosis not present

## 2019-08-28 DIAGNOSIS — J441 Chronic obstructive pulmonary disease with (acute) exacerbation: Secondary | ICD-10-CM | POA: Diagnosis not present

## 2019-08-28 DIAGNOSIS — I87303 Chronic venous hypertension (idiopathic) without complications of bilateral lower extremity: Secondary | ICD-10-CM | POA: Diagnosis not present

## 2019-08-28 DIAGNOSIS — R600 Localized edema: Secondary | ICD-10-CM | POA: Diagnosis not present

## 2019-08-30 DIAGNOSIS — J449 Chronic obstructive pulmonary disease, unspecified: Secondary | ICD-10-CM | POA: Diagnosis not present

## 2019-09-01 DIAGNOSIS — J9611 Chronic respiratory failure with hypoxia: Secondary | ICD-10-CM | POA: Diagnosis not present

## 2019-09-01 DIAGNOSIS — J449 Chronic obstructive pulmonary disease, unspecified: Secondary | ICD-10-CM | POA: Diagnosis not present

## 2019-10-02 DIAGNOSIS — E871 Hypo-osmolality and hyponatremia: Secondary | ICD-10-CM | POA: Diagnosis not present

## 2019-10-02 DIAGNOSIS — J449 Chronic obstructive pulmonary disease, unspecified: Secondary | ICD-10-CM | POA: Diagnosis not present

## 2019-10-02 DIAGNOSIS — I87303 Chronic venous hypertension (idiopathic) without complications of bilateral lower extremity: Secondary | ICD-10-CM | POA: Diagnosis not present

## 2019-10-02 DIAGNOSIS — J441 Chronic obstructive pulmonary disease with (acute) exacerbation: Secondary | ICD-10-CM | POA: Diagnosis not present

## 2019-10-02 DIAGNOSIS — J9611 Chronic respiratory failure with hypoxia: Secondary | ICD-10-CM | POA: Diagnosis not present

## 2019-10-02 DIAGNOSIS — Z0000 Encounter for general adult medical examination without abnormal findings: Secondary | ICD-10-CM | POA: Diagnosis not present

## 2019-10-09 DIAGNOSIS — J441 Chronic obstructive pulmonary disease with (acute) exacerbation: Secondary | ICD-10-CM | POA: Diagnosis not present

## 2019-10-15 DIAGNOSIS — J449 Chronic obstructive pulmonary disease, unspecified: Secondary | ICD-10-CM | POA: Diagnosis not present

## 2019-10-18 ENCOUNTER — Other Ambulatory Visit: Payer: Self-pay

## 2019-10-18 ENCOUNTER — Encounter: Payer: Self-pay | Admitting: Internal Medicine

## 2019-10-18 ENCOUNTER — Ambulatory Visit: Payer: Medicare Other | Admitting: Internal Medicine

## 2019-10-18 DIAGNOSIS — J449 Chronic obstructive pulmonary disease, unspecified: Secondary | ICD-10-CM | POA: Diagnosis not present

## 2019-10-18 DIAGNOSIS — J9611 Chronic respiratory failure with hypoxia: Secondary | ICD-10-CM | POA: Diagnosis not present

## 2019-10-18 MED ORDER — TRELEGY ELLIPTA 100-62.5-25 MCG/INH IN AEPB: INHALATION_SPRAY | RESPIRATORY_TRACT | 11 refills | Status: DC

## 2019-10-18 MED ORDER — TRELEGY ELLIPTA 100-62.5-25 MCG/INH IN AEPB: 1.0000 | INHALATION_SPRAY | Freq: Every day | RESPIRATORY_TRACT | 0 refills | Status: DC

## 2019-10-18 NOTE — Assessment & Plan Note (Signed)
02 2lpm 24/7 as of 10/03/2018  -  10/18/2019   Walked RA  approx   300 ft  @ moderate to slow   pace  stopped due to  Sob  With sats still 92%     Adequate control on present rx, reviewed in detail with pt > no change in rx needed  For now           Each maintenance medication was reviewed in detail including emphasizing most importantly the difference between maintenance and prns and under what circumstances the prns are to be triggered using an action plan format where appropriate.  Total time for H and P, chart review, counseling, teaching device/  directly observing portions of ambulatory 02 saturation study/  and generating customized AVS unique to this office visit / charting = 30 min

## 2019-10-18 NOTE — Patient Instructions (Addendum)
Plan A = Automatic = Always=    Trelegy 100 one click each am when feet hit the floor - take two good drags then brush teeth and tongue with arm and hammer toothpaste  Stop the budesonide   Plan B = Backup (to supplement plan A, not to replace it) Only use your albuterol (proair)  inhaler as a rescue medication to be used if you can't catch your breath by resting or doing a relaxed purse lip breathing pattern.  - The less you use it, the better it will work when you need it. - Ok to use the inhaler up to 2 puffs  every 4 hours if you must but call for appointment if use goes up over your usual need - Don't leave home without it !!  (think of it like the spare tire for your car)   Plan C = Crisis (instead of Plan B but only if Plan B stops working) - only use your albuterol nebulizer if you first try Plan B and it fails to help > ok to use the nebulizer up to every 4 hours but if start needing it regularly call for immediate appointment  Plan D = Deltasone 5 mg  4 daily until better then wean to one   Please schedule a follow up office visit in 6 weeks, sooner if needed

## 2019-10-18 NOTE — Progress Notes (Signed)
Jeffery [name], male    DOB: 06-Dec-1950,    MRN: [id number]   Brief patient profile:  89  yobm quit smoking 05/2013 with onset of doe around 2013 and at the point he quit smoking already on neb qid and then placed on variable inhalers and 02 but does not recall ever having  pfts  Seen in ER x 6 x in 2020 so referred to pulmonary clinic 10/03/2018 by Dr   Scarlette Ar steroid dep x 2019.    History of Present Illness  10/03/2018  Pulmonary/ 1st office eval/Jeffery [name]  On ACEi/dpi's including spiriva  Chief Complaint  Patient presents with  . Consult    Consult RD:EYCX. States his breathing has its good and bad days. States he has been on oxygen for about 2 years and also uses at night.   Dyspnea:  Much better p ER rx x 6  But only temporarily while  On pred 40 mg then back to 5 mg and flares w/in a week  Cough: dry/choking sensation/ worse when use inhalers esp dpi's Sleep: prone / bed is flat SABA use: multiple and not helpful  02 2lpm hs  rec Take omeprazole 20 mg x 2  About 30-60 before your first meal Take famotidine 40 mg after supper or before bed  Stop all inhalers and just use the nebulizer = duoneb ipratropium /albuterol breafast lunch supper and bedtime automatically. Only use your albuterol nebulizer as a rescue medication  Stop benazapril and start  micardis (telmisartan) 40 mg one daily in its place and call me if there is a problem  Prednisone 20 mg this evening,  Then 20 mg x 2 days, 10 mg x 2 days then stay on 5 mg daily but if get worse go back to 20 mg each am and start over   10/31/2018  f/u ov/Jeffery [name] re:  Copd/ ? acei contributing to some of his symptoms?  Chief Complaint  Patient presents with  . Follow-up    Breathing is doing well today. He states his throat feels dry.    Dyspnea:  Walked to stop sign and back s stopping x two = 250 ft? Cough: better / no longer choking but throat still doesn't feel quite right  Sleeping: ok p xanax  SABA use: avg 2-3 daily  02:  2lpm NP  24/7  rec Reduce your telmisartan to 40 mg one half a pill daily  Duoneb=  ipratropium /albuterol breafast lunch supper and bedtime automatically. As a backup >>> Only use your albuterol nebulizer as a rescue medication to be used if you can't catch your breath by resting or doing a relaxed purse lip breathing pattern.  - The less you use it, the better it will work when you need it. - Ok to use up to  every 3  hours if you must but call for immediate appointment if use goes up over your usual need or resume prednisone at 20 mg   Prednisone is 20 mg per day  Until  (5mg  x 4 pills) until better then taper to 5 mg  - if worse go back up  Please schedule a follow up office visit in 4 weeks, sooner if needed  with all medications /inhalers/ solutions in hand so we can verify exactly what you are taking. This includes all medications from all doctors and over the counters Add: consider adding budesonide to neb next ov if can't reduce      11/28/2018  f/u ov/Jeffery [name] re: COPD /  f/u off acei improved overall - using duoneb qid as   maint rx plus saba hfa "when over does it" then makes him shaky   Chief Complaint  Patient presents with  . Follow-up    Breathing is "so so". He is c/o shakiness and feels like his legs give out too easily. He is using his albuterol inhaler 2 x daily and Duoneb 4 x daily.   Dyspnea:  Walks to stop sign and back on 2lpm Cough: no Sleeping: ok prone flat bed  SABA use: as above 02: 2l  Lpm24/7  rec No change rx     04/29/2019  f/u ov/Jeffery [name] re: Severe copd/ 02 dep / easily confused re details of care  Chief Complaint  Patient presents with  . Follow-up    Breathing is unchanged.   Dyspnea:  No longer walking outside home, doe x room to room  Cough: minimally congested, white mucus esp in am Sleeping: ok flat one pillow SABA use: much less  02: 2lpm 24/7  rec Plan A = Automatic = Always=    budsonide / ipatropium-albuterol first thing in am in nebulizer and again  12 hours  Plan B = Backup (to supplement plan A, not to replace it) Only use your albuterol inhaler(PROAIR /red)  as a rescue medication   Plan C = Crisis (instead of Plan B but only if Plan B stops working) - only use your ipatropium/albuterol nebulizer if you first try Plan B and it fails to help > ok to use the nebulizer up to every 4 hours but if start needing it regularly call for immediate appointment Plan D = Deltasone = Prednisone  Prednisone is 20 mg per day  Until  (5mg  x 4 pills) until better then taper to 5 mg  - if worse go back up      10/18/2019  f/u ov/Jeffery [name] re: severe copd /taking salt supplements with newly reported leg swelling/ had both shots  Chief Complaint  Patient presents with  . Follow-up    Breathing is unchanged since the last visit. He is using his albuterol inhaler 2-3 x per day and neb with albuterol 2-3 x per day.   Dyspnea: room to room  Cough: none  Sleeping: one pillow bed is flat  SABA use: as above using neb only and very easily confused with details of care.  02: 2lpm 24/ 7    No obvious day to day or daytime variability or assoc excess/ purulent sputum or mucus plugs or hemoptysis or cp or chest tightness, subjective wheeze or overt sinus or hb symptoms.   Sleeping  without nocturnal  or early am exacerbation  of respiratory  c/o's or need for noct saba. Also denies any obvious fluctuation of symptoms with weather or environmental changes or other aggravating or alleviating factors except as outlined above   No unusual exposure hx or h/o childhood pna/ asthma or knowledge of premature birth.  Current Allergies, Complete Past Medical History, Past Surgical History, Family History, and Social History were reviewed in 06-10-1993 record.  ROS  The following are not active complaints unless bolded Hoarseness, sore throat, dysphagia, dental problems, itching, sneezing,  nasal congestion or discharge of excess mucus or purulent  secretions, ear ache,   fever, chills, sweats, unintended wt loss or wt gain, classically pleuritic or exertional cp,  orthopnea pnd or arm/hand swelling  or leg swelling, presyncope, palpitations, abdominal pain, anorexia, nausea, vomiting, diarrhea  or change in bowel habits  or change in bladder habits, change in stools or change in urine, dysuria, hematuria,  rash, arthralgias, visual complaints, headache, numbness, weakness or ataxia or problems with walking or coordination,  change in mood or  memory.        Current Meds  Medication Sig  . albuterol (PROAIR HFA) 108 (90 Base) MCG/ACT inhaler Inhale 1-2 puffs into the lungs every 4 (four) hours as needed. (Patient taking differently: Inhale 1-2 puffs into the lungs every 4 (four) hours as needed for wheezing or shortness of breath. )  . ALPRAZolam (XANAX) 0.5 MG tablet Take 0.5 mg by mouth at bedtime.  Marland Kitchen aspirin EC 81 MG tablet Take 81 mg by mouth daily.  . budesonide (PULMICORT) 0.25 MG/2ML nebulizer solution Take 2 mLs (0.25 mg total) by nebulization 2 (two) times daily.  . chlorthalidone (HYGROTON) 25 MG tablet Take 12.5 mg by mouth daily.   . famotidine (PEPCID) 40 MG tablet 1 daily after supper (Patient taking differently: Take 40 mg by mouth daily after supper. )  . gabapentin (NEURONTIN) 300 MG capsule Take 300 mg by mouth 3 (three) times daily.   Marland Kitchen ipratropium-albuterol (DUONEB) 0.5-2.5 (3) MG/3ML SOLN USE 1 VIAL VIA NEBULIZER 4 TIMES DAILY.  . isosorbide mononitrate (IMDUR) 30 MG 24 hr tablet Take 30 mg by mouth daily.  Marland Kitchen MATZIM LA 240 MG 24 hr tablet Take 240 mg by mouth daily.  . metFORMIN (GLUCOPHAGE) 500 MG tablet Take 500 mg by mouth daily.  . Multiple Vitamin (MULTIVITAMIN WITH MINERALS) TABS tablet Take 1 tablet by mouth daily.  Marland Kitchen omeprazole (PRILOSEC) 20 MG capsule Take 20 mg by mouth daily.  . OXYGEN 2lpm 24/7  . potassium chloride SA (K-DUR,KLOR-CON) 20 MEQ tablet Take 20 mEq by mouth daily.   . predniSONE (DELTASONE) 5 MG  tablet Prednisone is 20 mg per day  Until  (5mg  x 4 pills) until better then taper to 5 mg  - if worse go back up (Patient taking differently: Take 5 mg by mouth daily with breakfast. Take 1 tablet (5 mg) by mouth scheduled each day, may increase to 20 mg if needed for respiratory issues)  . simvastatin (ZOCOR) 20 MG tablet Take 20 mg by mouth at bedtime.   . sucralfate (CARAFATE) 1 g tablet Take 1 g by mouth 2 (two) times daily.   telmisartan (MICARDIS) 40 MG tablet Take 1 tablet (40 mg total) by mouth daily.  Marland Kitchen UNABLE TO FIND Med Name: buffered salt supplement twice daily                Past Medical History:  Diagnosis Date  . Chest pain 04/2011   Normal echocardiogram  . COPD (chronic obstructive pulmonary disease) (HCC)   . Fasting hyperglycemia    Normal hemoglobin A1c of 6.0  . GERD (gastroesophageal reflux disease)   . Hyperlipemia   . Hypertension   . On home O2    2L N/C  . On home O2    at night  . Prediabetes 06/04/2015  . Tobacco abuse          Objective:    Elderly bm >> stated age  Vital signs reviewed  10/18/2019  - Note at rest 02 sats  99% on 2lpm pulsed    10/18/2019        109 04/29/2019      110  11/28/2018        116   10/31/18 112 lb (50.8 kg)  10/03/18 114 lb 6.4  oz (51.9 kg)  09/30/18 116 lb (52.6 kg)    HEENT : pt wearing mask not removed for exam due to covid -19 concerns.   NECK :  without JVD/Nodes/TM/ nl carotid upstrokes bilaterally  LUNGS: no acc muscle use,  Mod barrel  contour chest wall with bilateral  Distant bs s audible wheeze and  without cough on insp or exp maneuvers and mod  Hyperresonant  to  percussion bilaterally     CV:  RRR  no s3 or murmur or increase in P2, and trace bilateral ankle edema   ABD:  soft and nontender with pos mid insp Hoover's  in the supine position. No bruits or organomegaly appreciated, bowel sounds nl  MS:     ext warm without deformities, calf tenderness, cyanosis or clubbing No obvious joint  restrictions   SKIN: warm and dry without lesions    NEURO:  alert, approp, nl sensorium with  no motor or cerebellar deficits apparent.          I personally reviewed images and agree with radiology impression as follows:  CXR:   Portable 07/26/19 Changes of COPD with RIGHT basilar and LEFT apical scarring. No acute abnormalities.       Assessment

## 2019-10-18 NOTE — Assessment & Plan Note (Signed)
Quit smoking  05/2013 and steroid dep since 2019  - 10/03/2018  After extensive coaching inhaler device,  effectiveness =    0% (can't use them) - d/c all inhalers x for neb 10/03/2018 and d/c ACEi as well  - 10/18/2019  After extensive coaching inhaler device,  effectiveness =    75% with elipta > try trelegy one click each am   Group D in terms of symptom/risk and laba/lama/ICS  therefore appropriate rx at this point >>>  Try trelegy   I spent extra time with pt today reviewing appropriate use of albuterol for prn use on exertion with the following points: 1) saba is for relief of sob that does not improve by walking a slower pace or resting but rather if the pt does not improve after trying this first. 2) If the pt is convinced, as many are, that saba helps recover from activity faster then it's easy to tell if this is the case by re-challenging : ie stop, take the inhaler, then p 5 minutes try the exact same activity (intensity of workload) that just caused the symptoms and see if they are substantially diminished or not after saba 3) if there is an activity that reproducibly causes the symptoms, try the saba 15 min before the activity on alternate days   If in fact the saba really does help, then fine to continue to use it prn but advised may need to look closer at the maintenance regimen being used to achieve better control of airways disease with exertion.   Advised: Advised:  formulary restrictions will be an ongoing challenge for the forseable future and I would be happy to pick an alternative if the pt will first  provide me a list of them -  pt  will need to return here for training for any new device that is required eg dpi vs hfa vs respimat.    In the meantime we can always provide samples(as we did today)  so that the patient never runs out of any needed respiratory medications.

## 2019-10-28 ENCOUNTER — Other Ambulatory Visit: Payer: Self-pay | Admitting: Internal Medicine

## 2019-10-28 ENCOUNTER — Ambulatory Visit: Payer: Medicare Other | Admitting: Internal Medicine

## 2019-11-01 DIAGNOSIS — J449 Chronic obstructive pulmonary disease, unspecified: Secondary | ICD-10-CM | POA: Diagnosis not present

## 2019-11-01 DIAGNOSIS — J9611 Chronic respiratory failure with hypoxia: Secondary | ICD-10-CM | POA: Diagnosis not present

## 2019-11-11 ENCOUNTER — Telehealth: Payer: Self-pay | Admitting: Internal Medicine

## 2019-11-11 NOTE — Telephone Encounter (Signed)
Ran test claims for 1 month supply.   Trelegy- $47.00  Breztri- $47.00  If copay at other pharmacy was more expensive, it could be due to the remaining deductible. $47.00 is the standard copay. Patient can apply for patient assistance if standard copay is too expensive.

## 2019-11-11 NOTE — Telephone Encounter (Signed)
Called and spoke with pt's wife Jeffery [name] letting her know the info we found out from [place], Associate Professor. She stated that she did call insurance and was told that they did have a remaining deductible that needed to be paid and then after that, Rx would be copay of $47 which is same amount Rachael received. Jeffery [name] stated that she did tell the pharmacy to go ahead and fill the Rx and they would pay the fee of $147. Nothing further needed.

## 2019-11-11 NOTE — Telephone Encounter (Signed)
Rachael, is there any way you can help [place] figure out which meds might be more affordable for pt?

## 2019-11-25 ENCOUNTER — Ambulatory Visit: Payer: Medicare Other | Admitting: Internal Medicine

## 2019-11-25 ENCOUNTER — Encounter: Payer: Self-pay | Admitting: Internal Medicine

## 2019-11-25 ENCOUNTER — Other Ambulatory Visit: Payer: Self-pay

## 2019-11-25 DIAGNOSIS — J9611 Chronic respiratory failure with hypoxia: Secondary | ICD-10-CM | POA: Diagnosis not present

## 2019-11-25 DIAGNOSIS — J449 Chronic obstructive pulmonary disease, unspecified: Secondary | ICD-10-CM

## 2019-11-25 NOTE — Patient Instructions (Addendum)
Make sure you check your oxygen saturations at highest level of activity to be sure it stays over 90% and adjust upward to maintain this level if needed but remember to turn it back to previous settings when you stop (to conserve your supply).   No change in medications    Please schedule a follow up visit in 3 months but call sooner if needed

## 2019-11-25 NOTE — Progress Notes (Signed)
Jeffery [name], male    DOB: 06-12-1950,    MRN: [id number]   Brief patient profile:  72  yobm quit smoking 05/2013 with onset of doe around 2013 and at the point he quit smoking already on neb qid and then placed on variable inhalers and 02 but does not recall ever having  pfts  Seen in ER x 6 x in 2020 so referred to pulmonary clinic 10/03/2018 by Dr   Jeffery [name] steroid dep x 2019.    History of Present Illness  10/03/2018  Pulmonary/ 1st office eval/Jeffery [name]  On ACEi/dpi's including spiriva  Chief Complaint  Patient presents with  . Consult    Consult WN:UUVO. States his breathing has its good and bad days. States he has been on oxygen for about 2 years and also uses at night.   Dyspnea:  Much better p ER rx x 6  But only temporarily while  On pred 40 mg then back to 5 mg and flares w/in a week  Cough: dry/choking sensation/ worse when use inhalers esp dpi's Sleep: prone / bed is flat SABA use: multiple and not helpful  02 2lpm hs  rec Take omeprazole 20 mg x 2  About 30-60 before your first meal Take famotidine 40 mg after supper or before bed  Stop all inhalers and just use the nebulizer = duoneb ipratropium /albuterol breafast lunch supper and bedtime automatically. Only use your albuterol nebulizer as a rescue medication  Stop benazapril and start  micardis (telmisartan) 40 mg one daily in its place and call me if there is a problem  Prednisone 20 mg this evening,  Then 20 mg x 2 days, 10 mg x 2 days then stay on 5 mg daily but if get worse go back to 20 mg each am and start over   10/31/2018  f/u ov/Jeffery [name] re:  Copd/ ? acei contributing to some of his symptoms?  Chief Complaint  Patient presents with  . Follow-up    Breathing is doing well today. He states his throat feels dry.    Dyspnea:  Walked to stop sign and back s stopping x two = 250 ft? Cough: better / no longer choking but throat still doesn't feel quite right  Sleeping: ok p xanax  SABA use: avg 2-3 daily  02:  2lpm NP  24/7  rec Reduce your telmisartan to 40 mg one half a pill daily  Duoneb=  ipratropium /albuterol breafast lunch supper and bedtime automatically. As a backup >>> Only use your albuterol nebulizer as a rescue medication to be used if you can't catch your breath by resting or doing a relaxed purse lip breathing pattern.  - The less you use it, the better it will work when you need it. - Ok to use up to  every 3  hours if you must but call for immediate appointment if use goes up over your usual need or resume prednisone at 20 mg   Prednisone is 20 mg per day  Until  (5mg  x 4 pills) until better then taper to 5 mg  - if worse go back up  Please schedule a follow up office visit in 4 weeks, sooner if needed  with all medications /inhalers/ solutions in hand so we can verify exactly what you are taking. This includes all medications from all doctors and over the counters Add: consider adding budesonide to neb next ov if can't reduce      11/28/2018  f/u ov/Jeffery [name] re: COPD /  f/u off acei improved overall - using duoneb qid as   maint rx plus saba hfa "when over does it" then makes him shaky   Chief Complaint  Patient presents with  . Follow-up    Breathing is "so so". He is c/o shakiness and feels like his legs give out too easily. He is using his albuterol inhaler 2 x daily and Duoneb 4 x daily.   Dyspnea:  Walks to stop sign and back on 2lpm Cough: no Sleeping: ok prone flat bed  SABA use: as above 02: 2l  Lpm24/7  rec No change rx     04/29/2019  f/u ov/Jeffery [name] re: Severe copd/ 02 dep / easily confused re details of care  Chief Complaint  Patient presents with  . Follow-up    Breathing is unchanged.   Dyspnea:  No longer walking outside home, doe x room to room  Cough: minimally congested, white mucus esp in am Sleeping: ok flat one pillow SABA use: much less  02: 2lpm 24/7  rec Plan A = Automatic = Always=    budsonide / ipatropium-albuterol first thing in am in nebulizer and again  12 hours  Plan B = Backup (to supplement plan A, not to replace it) Only use your albuterol inhaler(PROAIR /red)  as a rescue medication   Plan C = Crisis (instead of Plan B but only if Plan B stops working) - only use your ipatropium/albuterol nebulizer if you first try Plan B and it fails to help > ok to use the nebulizer up to every 4 hours but if start needing it regularly call for immediate appointment Plan D = Deltasone = Prednisone  Prednisone is 20 mg per day  Until  (5mg  x 4 pills) until better then taper to 5 mg  - if worse go back up      10/18/2019  f/u ov/Jeffery [name] re: severe copd /taking salt supplements with newly reported leg swelling/ had both shots  Chief Complaint  Patient presents with  . Follow-up    Breathing is unchanged since the last visit. He is using his albuterol inhaler 2-3 x per day and neb with albuterol 2-3 x per day.   Dyspnea: room to room  Cough: none  Sleeping: one pillow bed is flat  SABA use: as above using neb only and very easily confused with details of care.  02: 2lpm 24/ 7  rec Plan A = Automatic = Always=    Trelegy 100 one click each am when feet hit the floor - take two good drags then brush teeth and tongue with arm and hammer toothpaste Stop the budesonide  Plan B = Backup (to supplement plan A, not to replace it) Only use your albuterol (proair)  inhaler as a rescue medication Plan C = Crisis (instead of Plan B but only if Plan B stops working) - only use your albuterol nebulizer if you first try Plan B and it fails to help > ok to use the nebulizer up to every 4 hours but if start needing it regularly call for immediate appointment Plan D = Deltasone 5 mg  4 daily until better then wean to one  Please schedule a follow up office visit in 6 weeks, sooner if needed      11/25/2019  f/u ov/Jeffery [name] office/Jeffery [name] re:  GOLD ? / 02 dep trelegy / pred dep/02 dep  Chief Complaint  Patient presents with  . Follow-up    Breathing is overall doing  well. He has  not used his rescue inhaler or neb.   Dyspnea:  Walking neighborhood now up to 15 min Cough: rarely Sleeping: bed is flat one pillow SABA use: none  02: 2lpm 24/7    No obvious day to day or daytime variability or assoc excess/ purulent sputum or mucus plugs or hemoptysis or cp or chest tightness, subjective wheeze or overt sinus or hb symptoms.   Sleeping  without nocturnal  or early am exacerbation  of respiratory  c/o's or need for noct saba. Also denies any obvious fluctuation of symptoms with weather or environmental changes or other aggravating or alleviating factors except as outlined above   No unusual exposure hx or h/o childhood pna/ asthma or knowledge of premature birth.  Current Allergies, Complete Past Medical History, Past Surgical History, Family History, and Social History were reviewed in [employer] record.  ROS  The following are not active complaints unless bolded Hoarseness, sore throat, dysphagia, dental problems, itching, sneezing,  nasal congestion or discharge of excess mucus or purulent secretions, ear ache,   fever, chills, sweats, unintended wt loss or wt gain, classically pleuritic or exertional cp,  orthopnea pnd or arm/hand swelling  or leg swelling, presyncope, palpitations, abdominal pain, anorexia, nausea, vomiting, diarrhea  or change in bowel habits or change in bladder habits, change in stools or change in urine, dysuria, hematuria,  rash, arthralgias, visual complaints, headache, numbness, weakness or ataxia or problems with walking or coordination,  change in mood or  memory.        Current Meds  Medication Sig  . albuterol (PROAIR HFA) 108 (90 Base) MCG/ACT inhaler Inhale 1-2 puffs into the lungs every 4 (four) hours as needed. (Patient taking differently: Inhale 1-2 puffs into the lungs every 4 (four) hours as needed for wheezing or shortness of breath. )  . ALPRAZolam (XANAX) 0.5 MG tablet Take 0.5 mg by mouth at  bedtime.  Marland Kitchen aspirin EC 81 MG tablet Take 81 mg by mouth daily.  . famotidine (PEPCID) 40 MG tablet 1 daily after supper (Patient taking differently: Take 40 mg by mouth daily after supper. )  . Fluticasone-Umeclidin-Vilant (TRELEGY ELLIPTA) 100-62.5-25 MCG/INH AEPB One click each am  . gabapentin (NEURONTIN) 300 MG capsule Take 300 mg by mouth 3 (three) times daily.   . isosorbide mononitrate (IMDUR) 30 MG 24 hr tablet Take 30 mg by mouth daily.  Marland Kitchen MATZIM LA 240 MG 24 hr tablet Take 240 mg by mouth daily.  . metFORMIN (GLUCOPHAGE) 500 MG tablet Take 500 mg by mouth daily.  . Multiple Vitamin (MULTIVITAMIN WITH MINERALS) TABS tablet Take 1 tablet by mouth daily.  Marland Kitchen omeprazole (PRILOSEC) 20 MG capsule Take 20 mg by mouth daily.  . OXYGEN 2lpm 24/7  . potassium chloride SA (K-DUR,KLOR-CON) 20 MEQ tablet Take 20 mEq by mouth daily.   . predniSONE (DELTASONE) 5 MG tablet Prednisone is 20 mg per day  Until  (5mg  x 4 pills) until better then taper to 5 mg  - if worse go back up (Patient taking differently: Take 5 mg by mouth daily with breakfast. Take 1 tablet (5 mg) by mouth scheduled each day, may increase to 20 mg if needed for respiratory issues)  . simvastatin (ZOCOR) 20 MG tablet Take 20 mg by mouth at bedtime.   . sucralfate (CARAFATE) 1 g tablet Take 1 g by mouth 2 (two) times daily.   telmisartan (MICARDIS) 40 MG tablet TAKE 1 TABLET BY MOUTH ONCE A DAY.  Marland Kitchen  UNABLE TO FIND Med Name: buffered salt supplement twice daily                Past Medical History:  Diagnosis Date  . Chest pain 04/2011   Normal echocardiogram  . COPD (chronic obstructive pulmonary disease) (HCC)   . Fasting hyperglycemia    Normal hemoglobin A1c of 6.0  . GERD (gastroesophageal reflux disease)   . Hyperlipemia   . Hypertension   . On home O2    2L N/C  . On home O2    at night  . Prediabetes 06/04/2015  . Tobacco abuse          Objective:        11/25/2019        113 10/18/2019         109 04/29/2019      110  11/28/2018        116   10/31/18 112 lb (50.8 kg)  10/03/18 114 lb 6.4 oz (51.9 kg)  09/30/18 116 lb (52.6 kg)       Vital signs reviewed  11/25/2019  - Note at rest 02 sats  99% on 2lpm pulsed   HEENT : pt wearing mask not removed for exam due to covid -19 concerns.    NECK :  without JVD/Nodes/TM/ nl carotid upstrokes bilaterally   LUNGS: no acc muscle use,  Mod barrel  contour chest wall with bilateral  Distant bs s audible wheeze and  without cough on insp or exp maneuvers and mod  Hyperresonant  to  percussion bilaterally     CV:  RRR  no s3 or murmur or increase in P2, and trace ankle pitting sym bilaterally    ABD:  soft and nontender with pos mid insp Hoover's  in the supine position. No bruits or organomegaly appreciated, bowel sounds nl  MS:     ext warm without deformities, calf tenderness, cyanosis or clubbing No obvious joint restrictions   SKIN: warm and dry without lesions    NEURO:  alert, approp, nl sensorium with  no motor or cerebellar deficits apparent.                  Assessment

## 2019-11-26 ENCOUNTER — Encounter: Payer: Self-pay | Admitting: Internal Medicine

## 2019-11-26 NOTE — Assessment & Plan Note (Signed)
Quit smoking  05/2013 and steroid dep since 2019  - 10/03/2018  After extensive coaching inhaler device,  effectiveness =    0% (can't use them) - d/c all inhalers x for neb 10/03/2018 and d/c ACEi as well  - 10/18/2019  After extensive coaching inhaler device,  effectiveness =    75% with elipta > try trelegy one click each am > improved 11/25/2019    Group D in terms of symptom/risk and laba/lama/ICS  therefore appropriate rx at this point >>>  Continue trelegy/ prn saba

## 2019-11-26 NOTE — Assessment & Plan Note (Signed)
02 2lpm 24/7 as of 10/03/2018  -  10/18/2019   Walked RA  approx   300 ft  @ moderate to slow   pace  stopped due to  Sob  With sats still 92%     Advised: Make sure you check your oxygen saturations at highest level of activity to be sure it stays over 90% and adjust upward to maintain this level if needed but remember to turn it back to previous settings when you stop (to conserve your supply).    F/u q 3 m. Sooner if needed           Each maintenance medication was reviewed in detail including emphasizing most importantly the difference between maintenance and prns and under what circumstances the prns are to be triggered using an action plan format where appropriate.  Total time for H and P, chart review, counseling, teaching device and generating customized AVS unique to this office visit / charting = 20 min

## 2019-12-02 ENCOUNTER — Other Ambulatory Visit: Payer: Self-pay | Admitting: Primary Care

## 2019-12-02 DIAGNOSIS — J449 Chronic obstructive pulmonary disease, unspecified: Secondary | ICD-10-CM | POA: Diagnosis not present

## 2019-12-02 DIAGNOSIS — E1143 Type 2 diabetes mellitus with diabetic autonomic (poly)neuropathy: Secondary | ICD-10-CM | POA: Diagnosis not present

## 2019-12-02 DIAGNOSIS — E0843 Diabetes mellitus due to underlying condition with diabetic autonomic (poly)neuropathy: Secondary | ICD-10-CM | POA: Diagnosis not present

## 2019-12-02 DIAGNOSIS — M810 Age-related osteoporosis without current pathological fracture: Secondary | ICD-10-CM | POA: Diagnosis not present

## 2019-12-02 DIAGNOSIS — K219 Gastro-esophageal reflux disease without esophagitis: Secondary | ICD-10-CM | POA: Diagnosis not present

## 2019-12-02 DIAGNOSIS — J441 Chronic obstructive pulmonary disease with (acute) exacerbation: Secondary | ICD-10-CM | POA: Diagnosis not present

## 2019-12-02 DIAGNOSIS — J9611 Chronic respiratory failure with hypoxia: Secondary | ICD-10-CM | POA: Diagnosis not present

## 2019-12-16 DIAGNOSIS — H16223 Keratoconjunctivitis sicca, not specified as Sjogren's, bilateral: Secondary | ICD-10-CM | POA: Diagnosis not present

## 2019-12-16 DIAGNOSIS — Z961 Presence of intraocular lens: Secondary | ICD-10-CM | POA: Diagnosis not present

## 2020-01-01 DIAGNOSIS — R232 Flushing: Secondary | ICD-10-CM | POA: Diagnosis not present

## 2020-01-01 DIAGNOSIS — Z681 Body mass index (BMI) 19 or less, adult: Secondary | ICD-10-CM | POA: Diagnosis not present

## 2020-01-01 DIAGNOSIS — J441 Chronic obstructive pulmonary disease with (acute) exacerbation: Secondary | ICD-10-CM | POA: Diagnosis not present

## 2020-01-02 DIAGNOSIS — J9611 Chronic respiratory failure with hypoxia: Secondary | ICD-10-CM | POA: Diagnosis not present

## 2020-01-02 DIAGNOSIS — J449 Chronic obstructive pulmonary disease, unspecified: Secondary | ICD-10-CM | POA: Diagnosis not present

## 2020-01-03 DIAGNOSIS — R232 Flushing: Secondary | ICD-10-CM | POA: Diagnosis not present

## 2020-01-03 DIAGNOSIS — J441 Chronic obstructive pulmonary disease with (acute) exacerbation: Secondary | ICD-10-CM | POA: Diagnosis not present

## 2020-02-01 DIAGNOSIS — J449 Chronic obstructive pulmonary disease, unspecified: Secondary | ICD-10-CM | POA: Diagnosis not present

## 2020-02-01 DIAGNOSIS — J9611 Chronic respiratory failure with hypoxia: Secondary | ICD-10-CM | POA: Diagnosis not present

## 2020-02-03 DIAGNOSIS — J441 Chronic obstructive pulmonary disease with (acute) exacerbation: Secondary | ICD-10-CM | POA: Diagnosis not present

## 2020-02-03 DIAGNOSIS — Z681 Body mass index (BMI) 19 or less, adult: Secondary | ICD-10-CM | POA: Diagnosis not present

## 2020-02-03 DIAGNOSIS — J40 Bronchitis, not specified as acute or chronic: Secondary | ICD-10-CM | POA: Diagnosis not present

## 2020-02-16 ENCOUNTER — Emergency Department (HOSPITAL_COMMUNITY): Payer: Medicare Other

## 2020-02-16 ENCOUNTER — Inpatient Hospital Stay (HOSPITAL_COMMUNITY)
Admission: EM | Admit: 2020-02-16 | Discharge: 2020-02-20 | DRG: 871 | Disposition: A | Discharge status: Home / Self Care | Payer: Medicare Other | Attending: Internal Medicine | Admitting: Internal Medicine

## 2020-02-16 ENCOUNTER — Encounter (HOSPITAL_COMMUNITY): Payer: Self-pay | Admitting: Emergency Medicine

## 2020-02-16 ENCOUNTER — Other Ambulatory Visit: Payer: Self-pay

## 2020-02-16 DIAGNOSIS — K5900 Constipation, unspecified: Secondary | ICD-10-CM | POA: Diagnosis not present

## 2020-02-16 DIAGNOSIS — Z7984 Long term (current) use of oral hypoglycemic drugs: Secondary | ICD-10-CM | POA: Diagnosis not present

## 2020-02-16 DIAGNOSIS — E119 Type 2 diabetes mellitus without complications: Secondary | ICD-10-CM | POA: Diagnosis not present

## 2020-02-16 DIAGNOSIS — E785 Hyperlipidemia, unspecified: Secondary | ICD-10-CM | POA: Diagnosis present

## 2020-02-16 DIAGNOSIS — Z7951 Long term (current) use of inhaled steroids: Secondary | ICD-10-CM

## 2020-02-16 DIAGNOSIS — Z79899 Other long term (current) drug therapy: Secondary | ICD-10-CM

## 2020-02-16 DIAGNOSIS — J181 Lobar pneumonia, unspecified organism: Secondary | ICD-10-CM | POA: Diagnosis present

## 2020-02-16 DIAGNOSIS — R7303 Prediabetes: Secondary | ICD-10-CM | POA: Diagnosis present

## 2020-02-16 DIAGNOSIS — F411 Generalized anxiety disorder: Secondary | ICD-10-CM | POA: Diagnosis present

## 2020-02-16 DIAGNOSIS — J440 Chronic obstructive pulmonary disease with acute lower respiratory infection: Secondary | ICD-10-CM | POA: Diagnosis present

## 2020-02-16 DIAGNOSIS — Z87891 Personal history of nicotine dependence: Secondary | ICD-10-CM | POA: Diagnosis not present

## 2020-02-16 DIAGNOSIS — Z885 Allergy status to narcotic agent status: Secondary | ICD-10-CM | POA: Diagnosis not present

## 2020-02-16 DIAGNOSIS — K219 Gastro-esophageal reflux disease without esophagitis: Secondary | ICD-10-CM | POA: Diagnosis present

## 2020-02-16 DIAGNOSIS — Z23 Encounter for immunization: Secondary | ICD-10-CM | POA: Diagnosis not present

## 2020-02-16 DIAGNOSIS — I10 Essential (primary) hypertension: Secondary | ICD-10-CM | POA: Diagnosis not present

## 2020-02-16 DIAGNOSIS — Z825 Family history of asthma and other chronic lower respiratory diseases: Secondary | ICD-10-CM

## 2020-02-16 DIAGNOSIS — Z7982 Long term (current) use of aspirin: Secondary | ICD-10-CM | POA: Diagnosis not present

## 2020-02-16 DIAGNOSIS — E43 Unspecified severe protein-calorie malnutrition: Secondary | ICD-10-CM | POA: Diagnosis not present

## 2020-02-16 DIAGNOSIS — Z681 Body mass index (BMI) 19 or less, adult: Secondary | ICD-10-CM | POA: Diagnosis not present

## 2020-02-16 DIAGNOSIS — D72829 Elevated white blood cell count, unspecified: Secondary | ICD-10-CM

## 2020-02-16 DIAGNOSIS — R109 Unspecified abdominal pain: Secondary | ICD-10-CM | POA: Diagnosis not present

## 2020-02-16 DIAGNOSIS — J189 Pneumonia, unspecified organism: Secondary | ICD-10-CM | POA: Diagnosis present

## 2020-02-16 DIAGNOSIS — R0602 Shortness of breath: Secondary | ICD-10-CM | POA: Diagnosis not present

## 2020-02-16 DIAGNOSIS — Z20822 Contact with and (suspected) exposure to covid-19: Secondary | ICD-10-CM | POA: Diagnosis present

## 2020-02-16 DIAGNOSIS — A419 Sepsis, unspecified organism: Principal | ICD-10-CM | POA: Diagnosis present

## 2020-02-16 DIAGNOSIS — R000 Tachycardia, unspecified: Secondary | ICD-10-CM | POA: Diagnosis not present

## 2020-02-16 DIAGNOSIS — Z9981 Dependence on supplemental oxygen: Secondary | ICD-10-CM | POA: Diagnosis not present

## 2020-02-16 DIAGNOSIS — J9611 Chronic respiratory failure with hypoxia: Secondary | ICD-10-CM | POA: Diagnosis present

## 2020-02-16 DIAGNOSIS — Z720 Tobacco use: Secondary | ICD-10-CM | POA: Diagnosis not present

## 2020-02-16 HISTORY — DX: Generalized anxiety disorder: F41.1

## 2020-02-16 LAB — CBC WITH DIFFERENTIAL/PLATELET
Abs Immature Granulocytes: 0.24 10*3/uL — ABNORMAL HIGH (ref 0.00–0.07)
Basophils Absolute: 0.1 10*3/uL (ref 0.0–0.1)
Basophils Relative: 0 %
Eosinophils Absolute: 0.1 10*3/uL (ref 0.0–0.5)
Eosinophils Relative: 0 %
HCT: 41.2 % (ref 39.0–52.0)
Hemoglobin: 13 g/dL (ref 13.0–17.0)
Immature Granulocytes: 1 %
Lymphocytes Relative: 4 %
Lymphs Abs: 0.9 10*3/uL (ref 0.7–4.0)
MCH: 28.2 pg (ref 26.0–34.0)
MCHC: 31.6 g/dL (ref 30.0–36.0)
MCV: 89.4 fL (ref 80.0–100.0)
Monocytes Absolute: 1.8 10*3/uL — ABNORMAL HIGH (ref 0.1–1.0)
Monocytes Relative: 7 %
Neutro Abs: 21.6 10*3/uL — ABNORMAL HIGH (ref 1.7–7.7)
Neutrophils Relative %: 88 %
Platelets: 304 10*3/uL (ref 150–400)
RBC: 4.61 MIL/uL (ref 4.22–5.81)
RDW: 15.6 % — ABNORMAL HIGH (ref 11.5–15.5)
WBC: 24.7 10*3/uL — ABNORMAL HIGH (ref 4.0–10.5)
nRBC: 0 % (ref 0.0–0.2)

## 2020-02-16 LAB — BASIC METABOLIC PANEL
Anion gap: 15 (ref 5–15)
BUN: 19 mg/dL (ref 8–23)
CO2: 27 mmol/L (ref 22–32)
Calcium: 9.8 mg/dL (ref 8.9–10.3)
Chloride: 95 mmol/L — ABNORMAL LOW (ref 98–111)
Creatinine, Ser: 0.86 mg/dL (ref 0.61–1.24)
GFR, Estimated: >60 mL/min (ref >60)
Glucose, Bld: 120 mg/dL — ABNORMAL HIGH (ref 70–99)
Potassium: 4.3 mmol/L (ref 3.5–5.1)
Sodium: 137 mmol/L (ref 135–145)

## 2020-02-16 LAB — BRAIN NATRIURETIC PEPTIDE: B Natriuretic Peptide: 78 pg/mL (ref 0.0–100.0)

## 2020-02-16 MED ORDER — SODIUM CHLORIDE 0.9 % IV SOLN
500.0000 mg | Freq: Once | INTRAVENOUS | Status: AC
  Administered 2020-02-17: 500 mg via INTRAVENOUS
  Filled 2020-02-16: qty 500

## 2020-02-16 MED ORDER — SODIUM CHLORIDE 0.9 % IV SOLN
1.0000 g | Freq: Once | INTRAVENOUS | Status: AC
  Administered 2020-02-17: 1 g via INTRAVENOUS
  Filled 2020-02-16: qty 10

## 2020-02-16 NOTE — ED Triage Notes (Signed)
Pt reports hx of COPD with 2L [place] continuous at home, reports SOB since yesterday

## 2020-02-16 NOTE — ED Provider Notes (Signed)
[facility] EMERGENCY DEPARTMENT Provider Note   CSN: [id number] Arrival date & time: 02/16/20  1511     History Chief Complaint  Patient presents with  . Shortness of Breath    Jeffery [name] is a 69 y.o. male with a history of advanced COPD on home oxygen at 2 L 24/7, 3L when exercising, GERD, hypertension and diet-controlled diabetes presenting for evaluation of increased shortness of breath and wheezing which started yesterday.  He states he has a new nebulizer machine at home which he used for the first time yesterday and after using this he seemed to get increasingly more short of breath.  He has had a cough which is nonproductive.  He denies chest pain.  He has had intermittent wheezing and hears crackles when he breathes.  He has required increased oxygen at home since his symptoms worsen.  He denies orthopnea, has no swelling in his legs.  He has had intermittent chills followed by hot sweats, has taken his temperature and has been afebrile.  He is Covid vaccinated.  HPI     Past Medical History:  Diagnosis Date  . Chest pain 04/2011   Normal echocardiogram  . COPD (chronic obstructive pulmonary disease) (HCC)   . Fasting hyperglycemia    Normal hemoglobin A1c of 6.0  . GERD (gastroesophageal reflux disease)   . Hyperlipemia   . Hypertension   . On home O2    2L N/C  . On home O2    at night  . Prediabetes 06/04/2015  . Tobacco abuse     Patient Active Problem List   Diagnosis Date Noted  . Weight loss 01/28/2019  . COPD  GOLD ?  11/04/2018  . Chronic respiratory failure with hypoxia (HCC) 10/04/2018  . Right middle lobe pneumonia 11/07/2016  . Acute on chronic respiratory failure with hypoxia (HCC) 11/07/2016  . Prediabetes 06/04/2015  . Hyperglycemia, drug-induced 06/03/2015  . Essential hypertension 06/03/2015  . COPD exacerbation (HCC) 06/02/2015  . Hyperlipidemia 07/20/2011  . Fasting hyperglycemia   . Tobacco abuse   . Chest pain 04/02/2011     Past Surgical History:  Procedure Laterality Date  . APPENDECTOMY    . APPENDECTOMY    . CATARACT EXTRACTION W/PHACO Left 06/10/2019   Procedure: CATARACT EXTRACTION PHACO AND INTRAOCULAR LENS PLACEMENT (IOC);  Surgeon: Fabio Pierce, MD;  Location: [facility];  Service: Ophthalmology;  Laterality: Left;  CDE: 3.73  . CATARACT EXTRACTION W/PHACO Right 06/24/2019   Procedure: CATARACT EXTRACTION PHACO AND INTRAOCULAR LENS PLACEMENT (IOC);  Surgeon: Fabio Pierce, MD;  Location: [facility];  Service: Ophthalmology;  Laterality: Right;  CDE: 4.42  . LEFT HEART CATHETERIZATION WITH CORONARY ANGIOGRAM N/A 05/17/2011   Procedure: LEFT HEART CATHETERIZATION WITH CORONARY ANGIOGRAM;  Surgeon: Tonny Bollman, MD;  Location: [facility] CATH LAB;  Service: Cardiovascular;  Laterality: N/A;       Family History  Problem Relation Age of Onset  . Emphysema Father     Social History   Tobacco Use  . Smoking status: Former Smoker    Packs/day: 1.00    Years: 35.00    Pack years: 35.00    Types: Cigarettes    Quit date: 05/09/2013    Years since quitting: 6.7  . Smokeless tobacco: Never Used  Vaping Use  . Vaping Use: Never used  Substance Use Topics  . Alcohol use: No  . Drug use: No    Home Medications Prior to Admission medications   Medication Sig Start Date End Date  Taking? Authorizing Provider  albuterol (PROAIR HFA) 108 (90 Base) MCG/ACT inhaler Inhale 1-2 puffs into the lungs every 4 (four) hours as needed. Patient taking differently: Inhale 1-2 puffs into the lungs every 4 (four) hours as needed for wheezing or shortness of breath.  11/16/18   Nyoka Cowden, MD  ALPRAZolam Prudy Feeler) 0.5 MG tablet Take 0.5 mg by mouth at bedtime.    [provider]  aspirin EC 81 MG tablet Take 81 mg by mouth daily.    [provider]  chlorthalidone (HYGROTON) 25 MG tablet Take 12.5 mg by mouth daily.     [provider]  famotidine (PEPCID) 40 MG tablet 1 daily after supper Patient  taking differently: Take 40 mg by mouth daily after supper.  10/03/18   Nyoka Cowden, MD  Fluticasone-Umeclidin-Vilant (TRELEGY ELLIPTA) 100-62.5-25 MCG/INH AEPB One click each am 10/18/19   Nyoka Cowden, MD  gabapentin (NEURONTIN) 300 MG capsule Take 300 mg by mouth 3 (three) times daily.  03/22/13   [provider]  isosorbide mononitrate (IMDUR) 30 MG 24 hr tablet Take 30 mg by mouth daily. 03/22/14   [provider]  MATZIM LA 240 MG 24 hr tablet Take 240 mg by mouth daily. 04/16/19   [provider]  metFORMIN (GLUCOPHAGE) 500 MG tablet Take 500 mg by mouth daily.    [provider]  Multiple Vitamin (MULTIVITAMIN WITH MINERALS) TABS tablet Take 1 tablet by mouth daily.    [provider]  omeprazole (PRILOSEC) 20 MG capsule Take 20 mg by mouth daily.    [provider]  OXYGEN 2lpm 24/7    [provider]  potassium chloride SA (K-DUR,KLOR-CON) 20 MEQ tablet Take 20 mEq by mouth daily.  05/12/14   [provider]  predniSONE (DELTASONE) 5 MG tablet Prednisone is 20 mg per day  Until  (5mg  x 4 pills) until better then taper to 5 mg  - if worse go back up Patient taking differently: Take 5 mg by mouth daily with breakfast. Take 1 tablet (5 mg) by mouth scheduled each day, may increase to 20 mg if needed for respiratory issues 04/29/19   05/01/19, MD  simvastatin (ZOCOR) 20 MG tablet Take 20 mg by mouth at bedtime.  03/22/14   [provider]  sucralfate (CARAFATE) 1 g tablet Take 1 g by mouth 2 (two) times daily.  07/19/18   [provider]  telmisartan (MICARDIS) 40 MG tablet TAKE 1 TABLET BY MOUTH ONCE A DAY. 10/28/19   10/30/19, MD  UNABLE TO FIND Med Name: buffered salt supplement twice daily    [provider]    Allergies    Codeine  Review of Systems   Review of Systems  Constitutional: Positive for chills. Negative for fever.  HENT: Negative.   Eyes: Negative.    Respiratory: Positive for cough, chest tightness, shortness of breath and wheezing.   Cardiovascular: Negative for chest pain, palpitations and leg swelling.  Gastrointestinal: Negative for abdominal pain, nausea and vomiting.  Genitourinary: Negative.   Musculoskeletal: Negative for arthralgias, joint swelling and neck pain.  Skin: Negative.  Negative for rash and wound.  Neurological: Negative for dizziness, weakness, light-headedness, numbness and headaches.  Psychiatric/Behavioral: Negative.     Physical Exam Updated Vital Signs BP 118/64 (BP Location: Right Arm)   Pulse (!) 104   Temp 99.3 F (37.4 C) (Oral)   Resp (!) 22   Ht 5\' 6"  (1.676 m)  Wt 50.8 kg   SpO2 100%   BMI 18.08 kg/m   Physical Exam Vitals and nursing note reviewed.  Constitutional:      Appearance: He is well-developed.  HENT:     Head: Normocephalic and atraumatic.  Eyes:     Conjunctiva/sclera: Conjunctivae normal.  Cardiovascular:     Rate and Rhythm: Regular rhythm. Tachycardia present.     Heart sounds: Normal heart sounds.  Pulmonary:     Effort: Pulmonary effort is normal.     Breath sounds: Examination of the right-lower field reveals rhonchi. Rhonchi present. No wheezing.  Chest:     Chest wall: No tenderness.  Abdominal:     General: Bowel sounds are normal.     Palpations: Abdomen is soft.     Tenderness: There is no abdominal tenderness.  Musculoskeletal:        General: Normal range of motion.     Cervical back: Normal range of motion.     Right lower leg: No edema.     Left lower leg: No edema.  Skin:    General: Skin is warm and dry.  Neurological:     General: No focal deficit present.     Mental Status: He is alert.  Psychiatric:        Mood and Affect: Mood normal.     ED Results / Procedures / Treatments   Labs (all labs ordered are listed, but only abnormal results are displayed) Labs Reviewed  CBC WITH DIFFERENTIAL/PLATELET - Abnormal; Notable for the  following components:      Result Value   WBC 24.7 (*)    RDW 15.6 (*)    Neutro Abs 21.6 (*)    Monocytes Absolute 1.8 (*)    Abs Immature Granulocytes 0.24 (*)    All other components within normal limits  BASIC METABOLIC PANEL - Abnormal; Notable for the following components:   Chloride 95 (*)    Glucose, Bld 120 (*)    All other components within normal limits  RESP PANEL BY RT PCR (RSV, FLU A&B, COVID)  CULTURE, BLOOD (ROUTINE X 2)  CULTURE, BLOOD (ROUTINE X 2)  BRAIN NATRIURETIC PEPTIDE  LACTIC ACID, PLASMA    EKG None  Radiology DG Chest 2 View  Result Date: 02/16/2020 CLINICAL DATA:  Shortness of breath EXAM: CHEST - 2 VIEW COMPARISON:  08/08/2019 FINDINGS: Bibasilar airspace opacities. No effusions. Heart is normal size. No acute bony abnormality. IMPRESSION: Bilateral lower lobe airspace opacities, atelectasis versus infiltrates/pneumonia. Electronically Signed   By: Charlett Nose M.D.   On: 02/16/2020 20:54    Procedures Procedures (including critical care time)  Medications Ordered in ED Medications  cefTRIAXone (ROCEPHIN) 1 g in sodium chloride 0.9 % 100 mL IVPB (has no administration in time range)  azithromycin (ZITHROMAX) 500 mg in sodium chloride 0.9 % 250 mL IVPB (has no administration in time range)    ED Course  I have reviewed the triage vital signs and the nursing notes.  Pertinent labs & imaging results that were available during my care of the patient were reviewed by me and considered in my medical decision making (see chart for details).    MDM Rules/Calculators/A&P                          Pt with 1 day history of sob, copd on home oxygen,  bilateral pneumonia with significant leukocytosis.  Rhonchi at right base, no wheezing appreciated.  Additional labs  including lactate and blood cultures have been ordered.  Covid screening has also been ordered.  He is Covid vaccinated.  Chest x-ray does not suggest a Covid pattern.  He was given Rocephin  and Zithromax.  Call placed to hospitalist for admission.  Discussed with Dr. Carren RangZierle-Ghosh who will consult pt and consider for admission.  Final Clinical Impression(s) / ED Diagnoses Final diagnoses:  Community acquired pneumonia, unspecified laterality  Leukocytosis, unspecified type    Rx / DC Orders ED Discharge Orders    None       Victoriano Laindol, Andrus Sharp, PA-C 02/17/20 0007    Pricilla LovelessGoldston, Scott, MD 02/17/20 [id number]

## 2020-02-17 ENCOUNTER — Encounter (HOSPITAL_COMMUNITY): Payer: Self-pay | Admitting: Family Medicine

## 2020-02-17 DIAGNOSIS — D72829 Elevated white blood cell count, unspecified: Secondary | ICD-10-CM | POA: Diagnosis not present

## 2020-02-17 DIAGNOSIS — Z79899 Other long term (current) drug therapy: Secondary | ICD-10-CM | POA: Diagnosis not present

## 2020-02-17 DIAGNOSIS — F411 Generalized anxiety disorder: Secondary | ICD-10-CM | POA: Diagnosis present

## 2020-02-17 DIAGNOSIS — Z720 Tobacco use: Secondary | ICD-10-CM | POA: Diagnosis not present

## 2020-02-17 DIAGNOSIS — Z7951 Long term (current) use of inhaled steroids: Secondary | ICD-10-CM | POA: Diagnosis not present

## 2020-02-17 DIAGNOSIS — E43 Unspecified severe protein-calorie malnutrition: Secondary | ICD-10-CM | POA: Diagnosis present

## 2020-02-17 DIAGNOSIS — E785 Hyperlipidemia, unspecified: Secondary | ICD-10-CM | POA: Diagnosis present

## 2020-02-17 DIAGNOSIS — J9611 Chronic respiratory failure with hypoxia: Secondary | ICD-10-CM | POA: Diagnosis present

## 2020-02-17 DIAGNOSIS — J189 Pneumonia, unspecified organism: Secondary | ICD-10-CM | POA: Diagnosis present

## 2020-02-17 DIAGNOSIS — Z7984 Long term (current) use of oral hypoglycemic drugs: Secondary | ICD-10-CM | POA: Diagnosis not present

## 2020-02-17 DIAGNOSIS — Z87891 Personal history of nicotine dependence: Secondary | ICD-10-CM | POA: Diagnosis not present

## 2020-02-17 DIAGNOSIS — R109 Unspecified abdominal pain: Secondary | ICD-10-CM | POA: Diagnosis not present

## 2020-02-17 DIAGNOSIS — J440 Chronic obstructive pulmonary disease with acute lower respiratory infection: Secondary | ICD-10-CM | POA: Diagnosis present

## 2020-02-17 DIAGNOSIS — Z23 Encounter for immunization: Secondary | ICD-10-CM | POA: Diagnosis not present

## 2020-02-17 DIAGNOSIS — K219 Gastro-esophageal reflux disease without esophagitis: Secondary | ICD-10-CM | POA: Diagnosis present

## 2020-02-17 DIAGNOSIS — Z20822 Contact with and (suspected) exposure to covid-19: Secondary | ICD-10-CM | POA: Diagnosis present

## 2020-02-17 DIAGNOSIS — Z7982 Long term (current) use of aspirin: Secondary | ICD-10-CM | POA: Diagnosis not present

## 2020-02-17 DIAGNOSIS — A419 Sepsis, unspecified organism: Secondary | ICD-10-CM | POA: Diagnosis present

## 2020-02-17 DIAGNOSIS — J181 Lobar pneumonia, unspecified organism: Secondary | ICD-10-CM | POA: Diagnosis present

## 2020-02-17 DIAGNOSIS — R000 Tachycardia, unspecified: Secondary | ICD-10-CM | POA: Diagnosis not present

## 2020-02-17 DIAGNOSIS — Z825 Family history of asthma and other chronic lower respiratory diseases: Secondary | ICD-10-CM | POA: Diagnosis not present

## 2020-02-17 DIAGNOSIS — E119 Type 2 diabetes mellitus without complications: Secondary | ICD-10-CM | POA: Diagnosis present

## 2020-02-17 DIAGNOSIS — Z681 Body mass index (BMI) 19 or less, adult: Secondary | ICD-10-CM | POA: Diagnosis not present

## 2020-02-17 DIAGNOSIS — Z9981 Dependence on supplemental oxygen: Secondary | ICD-10-CM | POA: Diagnosis not present

## 2020-02-17 DIAGNOSIS — Z885 Allergy status to narcotic agent status: Secondary | ICD-10-CM | POA: Diagnosis not present

## 2020-02-17 DIAGNOSIS — K5900 Constipation, unspecified: Secondary | ICD-10-CM | POA: Diagnosis present

## 2020-02-17 DIAGNOSIS — I10 Essential (primary) hypertension: Secondary | ICD-10-CM | POA: Diagnosis not present

## 2020-02-17 LAB — MAGNESIUM: Magnesium: 2.1 mg/dL (ref 1.7–2.4)

## 2020-02-17 LAB — GLUCOSE, CAPILLARY
Glucose-Capillary: 113 mg/dL — ABNORMAL HIGH (ref 70–99)
Glucose-Capillary: 104 mg/dL — ABNORMAL HIGH (ref 70–99)

## 2020-02-17 LAB — CBC WITH DIFFERENTIAL/PLATELET
Abs Immature Granulocytes: 0.3 10*3/uL — ABNORMAL HIGH (ref 0.00–0.07)
Basophils Absolute: 0.1 10*3/uL (ref 0.0–0.1)
Basophils Relative: 0 %
Eosinophils Absolute: 0.2 10*3/uL (ref 0.0–0.5)
Eosinophils Relative: 1 %
HCT: 36.6 % — ABNORMAL LOW (ref 39.0–52.0)
Hemoglobin: 11.5 g/dL — ABNORMAL LOW (ref 13.0–17.0)
Immature Granulocytes: 1 %
Lymphocytes Relative: 4 %
Lymphs Abs: 0.8 10*3/uL (ref 0.7–4.0)
MCH: 28.7 pg (ref 26.0–34.0)
MCHC: 31.4 g/dL (ref 30.0–36.0)
MCV: 91.3 fL (ref 80.0–100.0)
Monocytes Absolute: 1.5 10*3/uL — ABNORMAL HIGH (ref 0.1–1.0)
Monocytes Relative: 7 %
Neutro Abs: 19.5 10*3/uL — ABNORMAL HIGH (ref 1.7–7.7)
Neutrophils Relative %: 87 %
Platelets: 294 10*3/uL (ref 150–400)
RBC: 4.01 MIL/uL — ABNORMAL LOW (ref 4.22–5.81)
RDW: 15.6 % — ABNORMAL HIGH (ref 11.5–15.5)
WBC: 22.3 10*3/uL — ABNORMAL HIGH (ref 4.0–10.5)
nRBC: 0 % (ref 0.0–0.2)

## 2020-02-17 LAB — COMPREHENSIVE METABOLIC PANEL
ALT: 14 U/L (ref 0–44)
AST: 15 U/L (ref 15–41)
Albumin: 3.8 g/dL (ref 3.5–5.0)
Alkaline Phosphatase: 63 U/L (ref 38–126)
Anion gap: 14 (ref 5–15)
BUN: 19 mg/dL (ref 8–23)
CO2: 28 mmol/L (ref 22–32)
Calcium: 9 mg/dL (ref 8.9–10.3)
Chloride: 95 mmol/L — ABNORMAL LOW (ref 98–111)
Creatinine, Ser: 0.87 mg/dL (ref 0.61–1.24)
GFR, Estimated: >60 mL/min (ref >60)
Glucose, Bld: 93 mg/dL (ref 70–99)
Potassium: 4.1 mmol/L (ref 3.5–5.1)
Sodium: 137 mmol/L (ref 135–145)
Total Bilirubin: 1.1 mg/dL (ref 0.3–1.2)
Total Protein: 7 g/dL (ref 6.5–8.1)

## 2020-02-17 LAB — CBG MONITORING, ED
Glucose-Capillary: 78 mg/dL (ref 70–99)
Glucose-Capillary: 112 mg/dL — ABNORMAL HIGH (ref 70–99)

## 2020-02-17 LAB — BLOOD GAS, ARTERIAL
Acid-Base Excess: 3.4 mmol/L — ABNORMAL HIGH (ref 0.0–2.0)
Bicarbonate: 27.1 mmol/L (ref 20.0–28.0)
FIO2: 32
O2 Saturation: 97.2 %
Patient temperature: 37.4
pCO2 arterial: 49 mmHg — ABNORMAL HIGH (ref 32.0–48.0)
pH, Arterial: 7.379 (ref 7.350–7.450)
pO2, Arterial: 96.2 mmHg (ref 83.0–108.0)

## 2020-02-17 LAB — RESP PANEL BY RT PCR (RSV, FLU A&B, COVID)
Influenza A by PCR: NEGATIVE
Influenza B by PCR: NEGATIVE
Respiratory Syncytial Virus by PCR: NEGATIVE
SARS Coronavirus 2 by RT PCR: NEGATIVE

## 2020-02-17 LAB — BLOOD GAS, VENOUS
Acid-Base Excess: 6.3 mmol/L — ABNORMAL HIGH (ref 0.0–2.0)
Bicarbonate: 27.7 mmol/L (ref 20.0–28.0)
FIO2: 28
O2 Saturation: 44.3 %
Patient temperature: 37
pCO2, Ven: 63 mmHg — ABNORMAL HIGH (ref 44.0–60.0)
pH, Ven: 7.327 (ref 7.250–7.430)
pO2, Ven: <31 mmHg — CL (ref 32.0–45.0)

## 2020-02-17 LAB — HIV ANTIBODY (ROUTINE TESTING W REFLEX): HIV Screen 4th Generation wRfx: NONREACTIVE

## 2020-02-17 LAB — LACTIC ACID, PLASMA: Lactic Acid, Venous: 1.2 mmol/L (ref 0.5–1.9)

## 2020-02-17 MED ORDER — ALPRAZOLAM 0.5 MG PO TABS: 0.5000 mg | ORAL_TABLET | Freq: Every day | ORAL | Status: DC

## 2020-02-17 MED ORDER — SODIUM CHLORIDE 0.9 % IV SOLN: 500.0000 mg | INTRAVENOUS | Status: DC

## 2020-02-17 MED ORDER — HYDROXYZINE HCL 25 MG PO TABS
25.0000 mg | ORAL_TABLET | Freq: Three times a day (TID) | ORAL | Status: DC | PRN
  Administered 2020-02-17 (×2): 25 mg via ORAL
  Filled 2020-02-17 (×2): qty 1

## 2020-02-17 MED ORDER — ALPRAZOLAM 0.5 MG PO TABS: 0.5000 mg | ORAL_TABLET | Freq: Three times a day (TID) | ORAL | Status: DC | PRN

## 2020-02-17 MED ORDER — DILTIAZEM HCL ER COATED BEADS 240 MG PO TB24
240.0000 mg | ORAL_TABLET | Freq: Every day | ORAL | Status: DC
  Administered 2020-02-17 – 2020-02-20 (×4): 240 mg via ORAL
  Filled 2020-02-17 (×8): qty 1

## 2020-02-17 MED ORDER — ALBUTEROL SULFATE HFA 108 (90 BASE) MCG/ACT IN AERS: 1.0000 | INHALATION_SPRAY | RESPIRATORY_TRACT | Status: DC | PRN

## 2020-02-17 MED ORDER — ISOSORBIDE MONONITRATE ER 60 MG PO TB24
30.0000 mg | ORAL_TABLET | Freq: Every day | ORAL | Status: DC
  Administered 2020-02-17 – 2020-02-20 (×4): 30 mg via ORAL
  Filled 2020-02-17 (×6): qty 1

## 2020-02-17 MED ORDER — INSULIN ASPART 100 UNIT/ML ~~LOC~~ SOLN
0.0000 [IU] | Freq: Three times a day (TID) | SUBCUTANEOUS | Status: DC
  Administered 2020-02-18 – 2020-02-19 (×2): 2 [IU] via SUBCUTANEOUS

## 2020-02-17 MED ORDER — ASPIRIN EC 81 MG PO TBEC
81.0000 mg | DELAYED_RELEASE_TABLET | Freq: Every day | ORAL | Status: DC
  Administered 2020-02-17 – 2020-02-20 (×4): 81 mg via ORAL
  Filled 2020-02-17 (×4): qty 1

## 2020-02-17 MED ORDER — LORAZEPAM 2 MG/ML IJ SOLN
0.5000 mg | Freq: Once | INTRAMUSCULAR | Status: AC
  Administered 2020-02-17: 0.5 mg via INTRAVENOUS
  Filled 2020-02-17: qty 1

## 2020-02-17 MED ORDER — ALPRAZOLAM 0.5 MG PO TABS: 0.5000 mg | ORAL_TABLET | Freq: Every evening | ORAL | Status: DC | PRN

## 2020-02-17 MED ORDER — SIMVASTATIN 20 MG PO TABS
20.0000 mg | ORAL_TABLET | Freq: Every day | ORAL | Status: DC
  Administered 2020-02-17 (×2): 20 mg via ORAL
  Filled 2020-02-17: qty 2
  Filled 2020-02-17: qty 1

## 2020-02-17 MED ORDER — FAMOTIDINE 20 MG PO TABS
40.0000 mg | ORAL_TABLET | Freq: Every day | ORAL | Status: DC
  Administered 2020-02-17 – 2020-02-19 (×3): 40 mg via ORAL
  Filled 2020-02-17 (×3): qty 2

## 2020-02-17 MED ORDER — GABAPENTIN 300 MG PO CAPS
300.0000 mg | ORAL_CAPSULE | Freq: Three times a day (TID) | ORAL | Status: DC
  Administered 2020-02-17 – 2020-02-20 (×9): 300 mg via ORAL
  Filled 2020-02-17 (×10): qty 1

## 2020-02-17 MED ORDER — IRBESARTAN 150 MG PO TABS
150.0000 mg | ORAL_TABLET | Freq: Every day | ORAL | Status: DC
  Administered 2020-02-17 – 2020-02-20 (×4): 150 mg via ORAL
  Filled 2020-02-17 (×6): qty 1

## 2020-02-17 MED ORDER — MIRTAZAPINE 15 MG PO TABS
7.5000 mg | ORAL_TABLET | Freq: Every day | ORAL | Status: DC
  Administered 2020-02-17 – 2020-02-19 (×3): 7.5 mg via ORAL
  Filled 2020-02-17 (×3): qty 1

## 2020-02-17 MED ORDER — ADULT MULTIVITAMIN W/MINERALS CH
1.0000 | ORAL_TABLET | Freq: Every day | ORAL | Status: DC
  Administered 2020-02-17 – 2020-02-20 (×4): 1 via ORAL
  Filled 2020-02-17 (×4): qty 1

## 2020-02-17 MED ORDER — SODIUM CHLORIDE 0.9 % IV SOLN
1.0000 g | INTRAVENOUS | Status: DC
  Administered 2020-02-17 – 2020-02-19 (×3): 1 g via INTRAVENOUS
  Filled 2020-02-17 (×3): qty 10

## 2020-02-17 MED ORDER — SODIUM CHLORIDE 0.9 % IV SOLN
INTRAVENOUS | Status: DC
Start: 2020-02-17 — End: 2020-02-20

## 2020-02-17 MED ORDER — PREDNISONE 10 MG PO TABS
5.0000 mg | ORAL_TABLET | Freq: Every day | ORAL | Status: DC
  Administered 2020-02-17 – 2020-02-20 (×4): 5 mg via ORAL
  Filled 2020-02-17 (×4): qty 1

## 2020-02-17 MED ORDER — HEPARIN SODIUM (PORCINE) 5000 UNIT/ML IJ SOLN
5000.0000 [IU] | Freq: Three times a day (TID) | INTRAMUSCULAR | Status: DC
  Administered 2020-02-17 – 2020-02-20 (×11): 5000 [IU] via SUBCUTANEOUS
  Filled 2020-02-17 (×11): qty 1

## 2020-02-17 MED ORDER — INFLUENZA VAC A&B SA ADJ QUAD 0.5 ML IM PRSY
0.5000 mL | PREFILLED_SYRINGE | INTRAMUSCULAR | Status: AC
  Administered 2020-02-20: 0.5 mL via INTRAMUSCULAR
  Filled 2020-02-17: qty 0.5

## 2020-02-17 MED ORDER — SODIUM CHLORIDE 0.9 % IV SOLN
500.0000 mg | INTRAVENOUS | Status: DC
  Administered 2020-02-17 – 2020-02-19 (×3): 500 mg via INTRAVENOUS
  Filled 2020-02-17 (×3): qty 500

## 2020-02-17 MED ORDER — IPRATROPIUM-ALBUTEROL 0.5-2.5 (3) MG/3ML IN SOLN: 3.0000 mL | Freq: Four times a day (QID) | RESPIRATORY_TRACT | Status: DC | PRN

## 2020-02-17 MED ORDER — CHLORTHALIDONE 25 MG PO TABS
12.5000 mg | ORAL_TABLET | Freq: Every day | ORAL | Status: DC
  Administered 2020-02-17 – 2020-02-20 (×4): 12.5 mg via ORAL
  Filled 2020-02-17: qty 0.5
  Filled 2020-02-17: qty 1
  Filled 2020-02-17: qty 0.5
  Filled 2020-02-17 (×2): qty 1
  Filled 2020-02-17: qty 0.5

## 2020-02-17 NOTE — ED Notes (Signed)
Date and time results received: 02/17/20 0417   Test: pO2 Critical Value: <  31  Name of Provider Notified: Cicero Duck, Asia DO

## 2020-02-17 NOTE — H&P (Signed)
TRH H&P    Patient Demographics:    Jeffery [name], is a 69 y.o. male  MRN: [id number]  DOB - 06-24-1950  Admit Date - 02/16/2020  Referring MD/NP/PA: Lincoln Maxin  Outpatient Primary MD for the patient is Toma Deiters, MD  Patient coming from: Home  Chief complaint-dyspnea   HPI:    Jeffery [name]  is a 69 y.o. male, with history of hypertension, hyperlipidemia, GERD, COPD, and more presents to the ED with a chief complaint of dyspnea.  Patient is on chronic 2 L nasal cannula.  He also uses inhalers and nebulizers for his COPD.  He reports that he started feeling dyspneic yesterday.  It has been constant and worse through today.  Reports that its worse with exertion, better with rest.  He has tried his nebulizers but they have not offered any relief.  He has an associated dry cough.  He does report body aches as well and a decreased appetite.  He denies any chest pain, palpitations, lightheadedness, change in urine or bowel movements, nausea and vomiting.  Patient reports he does not think he has had any fevers at home either.  His only other concern at this time is that he would like something for anxiety.  In the ED Temperature 99.3, heart rate 104, respiratory rate 22, blood pressure 118/64 White blood cell count 24.7, hemoglobin 13 CHEM panel unremarkable BNP 78 Blood cultures pending Lactic 1.2 Seven and Zithromax started Chest x-ray shows bilateral lower lobe airspace opacities, atelectasis versus infiltrate/pneumonia Covid pending -patient is fully vaccinated for Covid     Review of systems:    In addition to the HPI above,  No Fever-chills, No Headache, No changes with Vision or hearing, No problems swallowing food or Liquids, No Chest pain, admits to dry cough and shortness of Breath, No Abdominal pain, No Nausea or Vomiting, bowel movements are regular, reports decreased appetite  no Blood  in stool or Urine, No dysuria, No new skin rashes or bruises, No new joints pains-aches,  No new weakness, tingling, numbness in any extremity, No recent weight gain or loss, No polyuria, polydypsia or polyphagia, No significant Mental Stressors.  All other systems reviewed and are negative.    Past History of the following :    Past Medical History:  Diagnosis Date  . Chest pain 04/2011   Normal echocardiogram  . COPD (chronic obstructive pulmonary disease) (HCC)   . Fasting hyperglycemia    Normal hemoglobin A1c of 6.0  . GERD (gastroesophageal reflux disease)   . Hyperlipemia   . Hypertension   . On home O2    2L N/C  . On home O2    at night  . Prediabetes 06/04/2015  . Tobacco abuse       Past Surgical History:  Procedure Laterality Date  . APPENDECTOMY    . APPENDECTOMY    . CATARACT EXTRACTION W/PHACO Left 06/10/2019   Procedure: CATARACT EXTRACTION PHACO AND INTRAOCULAR LENS PLACEMENT (IOC);  Surgeon: Fabio Pierce, MD;  Location: [facility];  Service: Ophthalmology;  Laterality:  Left;  CDE: 3.73  . CATARACT EXTRACTION W/PHACO Right 06/24/2019   Procedure: CATARACT EXTRACTION PHACO AND INTRAOCULAR LENS PLACEMENT (IOC);  Surgeon: Fabio Pierce, MD;  Location: [facility];  Service: Ophthalmology;  Laterality: Right;  CDE: 4.42  . LEFT HEART CATHETERIZATION WITH CORONARY ANGIOGRAM N/A 05/17/2011   Procedure: LEFT HEART CATHETERIZATION WITH CORONARY ANGIOGRAM;  Surgeon: Tonny Bollman, MD;  Location: [facility] CATH LAB;  Service: Cardiovascular;  Laterality: N/A;      Social History:      Social History   Tobacco Use  . Smoking status: Former Smoker    Packs/day: 1.00    Years: 35.00    Pack years: 35.00    Types: Cigarettes    Quit date: 05/09/2013    Years since quitting: 6.7  . Smokeless tobacco: Never Used  Substance Use Topics  . Alcohol use: No       Family History :     Family History  Problem Relation Age of Onset  . Emphysema Father       Home  Medications:   Prior to Admission medications   Medication Sig Start Date End Date Taking? Authorizing Provider  albuterol (PROAIR HFA) 108 (90 Base) MCG/ACT inhaler Inhale 1-2 puffs into the lungs every 4 (four) hours as needed. Patient taking differently: Inhale 1-2 puffs into the lungs every 4 (four) hours as needed for wheezing or shortness of breath.  11/16/18   Nyoka Cowden, MD  ALPRAZolam Prudy Feeler) 0.5 MG tablet Take 0.5 mg by mouth at bedtime.    [provider]  aspirin EC 81 MG tablet Take 81 mg by mouth daily.    [provider]  chlorthalidone (HYGROTON) 25 MG tablet Take 12.5 mg by mouth daily.     [provider]  famotidine (PEPCID) 40 MG tablet 1 daily after supper Patient taking differently: Take 40 mg by mouth daily after supper.  10/03/18   Nyoka Cowden, MD  Fluticasone-Umeclidin-Vilant (TRELEGY ELLIPTA) 100-62.5-25 MCG/INH AEPB One click each am 10/18/19   Nyoka Cowden, MD  gabapentin (NEURONTIN) 300 MG capsule Take 300 mg by mouth 3 (three) times daily.  03/22/13   [provider]  isosorbide mononitrate (IMDUR) 30 MG 24 hr tablet Take 30 mg by mouth daily. 03/22/14   [provider]  MATZIM LA 240 MG 24 hr tablet Take 240 mg by mouth daily. 04/16/19   [provider]  metFORMIN (GLUCOPHAGE) 500 MG tablet Take 500 mg by mouth daily.    [provider]  Multiple Vitamin (MULTIVITAMIN WITH MINERALS) TABS tablet Take 1 tablet by mouth daily.    [provider]  omeprazole (PRILOSEC) 20 MG capsule Take 20 mg by mouth daily.    [provider]  OXYGEN 2lpm 24/7    [provider]  potassium chloride SA (K-DUR,KLOR-CON) 20 MEQ tablet Take 20 mEq by mouth daily.  05/12/14   [provider]  predniSONE (DELTASONE) 5 MG tablet Prednisone is 20 mg per day  Until  (5mg  x 4 pills) until better then taper to 5 mg  - if worse go back up Patient taking differently: Take 5 mg by mouth daily  with breakfast. Take 1 tablet (5 mg) by mouth scheduled each day, may increase to 20 mg if needed for respiratory issues 04/29/19   05/01/19, MD  simvastatin (ZOCOR) 20 MG tablet Take 20 mg by mouth at bedtime.  03/22/14   [provider]  sucralfate (CARAFATE) 1 g tablet  Take 1 g by mouth 2 (two) times daily.  07/19/18   [provider]  telmisartan (MICARDIS) 40 MG tablet TAKE 1 TABLET BY MOUTH ONCE A DAY. 10/28/19   Nyoka CowdenWert, Gavynn B, MD  UNABLE TO FIND Med Name: buffered salt supplement twice daily    [provider]     Allergies:     Allergies  Allergen Reactions  . Codeine Nausea Only     Physical Exam:   Vitals  Blood pressure 118/64, pulse (!) 104, temperature 99.3 F (37.4 C), temperature source Oral, resp. rate (!) 22, height 5\' 6"  (1.676 m), weight 50.8 kg, SpO2 100 %.  1.  General: Lying supine in bed no acute distress  2. Psychiatric: Very anxious, cooperative with exam, alert and oriented x3  3. Neurologic: Cranial nerves II through XII are grossly intact, no focal deficit on limited exam  4. HEENMT:  Head is atraumatic, normocephalic, pupils reactive to light, neck is supple, trachea is midline, mucous membranes are mildly dry, very poor dentition  5. Respiratory : Diminished breath sounds in the bilateral lower lung fields, mild wheeze, no rhonchi or rales, barrel chest  6. Cardiovascular : Heart rate tachycardic, rhythm regular, no murmurs rubs or gallops  7. Gastrointestinal:  Abdomen is soft, nondistended, nontender to palpation  8. Skin:  No acute lesions on limited skin exam  9.Musculoskeletal:  No peripheral edema or acute deformity    Data Review:    CBC Recent Labs  Lab 02/16/20 1945  WBC 24.7*  HGB 13.0  HCT 41.2  PLT 304  MCV 89.4  MCH 28.2  MCHC 31.6  RDW 15.6*  LYMPHSABS 0.9  MONOABS 1.8*  EOSABS 0.1  BASOSABS 0.1    ------------------------------------------------------------------------------------------------------------------  Results for orders placed or performed during the hospital encounter of 02/16/20 (from the past 48 hour(s))  CBC with Differential     Status: Abnormal   Collection Time: 02/16/20  7:45 PM  Result Value Ref Range   WBC 24.7 (H) 4.0 - 10.5 K/uL   RBC 4.61 4.22 - 5.81 MIL/uL   Hemoglobin 13.0 13.0 - 17.0 g/dL   HCT 04.541.2 39 - 52 %   MCV 89.4 80.0 - 100.0 fL   MCH 28.2 26.0 - 34.0 pg   MCHC 31.6 30.0 - 36.0 g/dL   RDW 40.915.6 (H) 81.111.5 - 91.415.5 %   Platelets 304 150 - 400 K/uL   nRBC 0.0 0.0 - 0.2 %   Neutrophils Relative % 88 %   Neutro Abs 21.6 (H) 1.7 - 7.7 K/uL   Lymphocytes Relative 4 %   Lymphs Abs 0.9 0.7 - 4.0 K/uL   Monocytes Relative 7 %   Monocytes Absolute 1.8 (H) 0.1 - 1.0 K/uL   Eosinophils Relative 0 %   Eosinophils Absolute 0.1 0.0 - 0.5 K/uL   Basophils Relative 0 %   Basophils Absolute 0.1 0.0 - 0.1 K/uL   Immature Granulocytes 1 %   Abs Immature Granulocytes 0.24 (H) 0.00 - 0.07 K/uL    Comment: Performed at [facility], [address] [place], [place] [id number]  Basic metabolic panel     Status: Abnormal   Collection Time: 02/16/20  7:45 PM  Result Value Ref Range   Sodium 137 135 - 145 mmol/L   Potassium 4.3 3.5 - 5.1 mmol/L   Chloride 95 (L) 98 - 111 mmol/L   CO2 27 22 - 32 mmol/L   Glucose, Bld 120 (H) 70 - 99 mg/dL  Comment: Glucose reference range applies only to samples taken after fasting for at least 8 hours.   BUN 19 8 - 23 mg/dL   Creatinine, Ser 7.25 0.61 - 1.24 mg/dL   Calcium 9.8 8.9 - 36.6 mg/dL   GFR, Estimated >44 >03 mL/min   Anion gap 15 5 - 15    Comment: Performed at [facility], [address] [place], [place] [address]  Brain natriuretic peptide     Status: None   Collection Time: 02/16/20  7:52 PM  Result Value Ref Range   B Natriuretic Peptide 78.0 0.0 - 100.0 pg/mL    Comment: Performed at [facility],  [address] [place], [place] [address]  Lactic acid, plasma     Status: None   Collection Time: 02/16/20 11:27 PM  Result Value Ref Range   Lactic Acid, Venous 1.2 0.5 - 1.9 mmol/L    Comment: Performed at [facility], [address], [place], [place] [address]    Chemistries  Recent Labs  Lab 02/16/20 1945  NA 137  K 4.3  CL 95*  CO2 27  GLUCOSE 120*  BUN 19  CREATININE 0.86  CALCIUM 9.8   ------------------------------------------------------------------------------------------------------------------  ------------------------------------------------------------------------------------------------------------------ GFR: Estimated Creatinine Clearance: 59.9 mL/min (by C-G formula based on SCr of 0.86 mg/dL). Liver Function Tests: No results for input(s): AST, ALT, ALKPHOS, BILITOT, PROT, ALBUMIN in the last 168 hours. No results for input(s): LIPASE, AMYLASE in the last 168 hours. No results for input(s): AMMONIA in the last 168 hours. Coagulation Profile: No results for input(s): INR, PROTIME in the last 168 hours. Cardiac Enzymes: No results for input(s): CKTOTAL, CKMB, CKMBINDEX, TROPONINI in the last 168 hours. BNP (last 3 results) No results for input(s): PROBNP in the last 8760 hours. HbA1C: No results for input(s): HGBA1C in the last 72 hours. CBG: No results for input(s): GLUCAP in the last 168 hours. Lipid Profile: No results for input(s): CHOL, HDL, LDLCALC, TRIG, CHOLHDL, LDLDIRECT in the last 72 hours. Thyroid Function Tests: No results for input(s): TSH, T4TOTAL, FREET4, T3FREE, THYROIDAB in the last 72 hours. Anemia Panel: No results for input(s): VITAMINB12, FOLATE, FERRITIN, TIBC, IRON, RETICCTPCT in the last 72 hours.  --------------------------------------------------------------------------------------------------------------- Urine analysis:    Component Value Date/Time   COLORURINE YELLOW 06/20/2018 1202   APPEARANCEUR CLEAR 06/20/2018 1202    LABSPEC 1.008 06/20/2018 1202   PHURINE 8.0 06/20/2018 1202   GLUCOSEU NEGATIVE 06/20/2018 1202   HGBUR NEGATIVE 06/20/2018 1202   BILIRUBINUR NEGATIVE 06/20/2018 1202   KETONESUR NEGATIVE 06/20/2018 1202   PROTEINUR NEGATIVE 06/20/2018 1202   UROBILINOGEN 0.2 04/12/2011 1548   NITRITE NEGATIVE 06/20/2018 1202   LEUKOCYTESUR NEGATIVE 06/20/2018 1202      Imaging Results:    DG Chest 2 View  Result Date: 02/16/2020 CLINICAL DATA:  Shortness of breath EXAM: CHEST - 2 VIEW COMPARISON:  08/08/2019 FINDINGS: Bibasilar airspace opacities. No effusions. Heart is normal size. No acute bony abnormality. IMPRESSION: Bilateral lower lobe airspace opacities, atelectasis versus infiltrates/pneumonia. Electronically Signed   By: Charlett Nose M.D.   On: 02/16/2020 20:54    My personal review of EKG: Rhythm sinus tachycardia, Rate 104 /min,no Acute ST changes   Assessment & Plan:    Active Problems:   CAP (community acquired pneumonia)   1. Community-acquired pneumonia 1. Chest x-ray shows bilateral pneumonia 2. Covid pending 3. Patient is fully vaccinated for Covid 4. Rocephin and Zithromax started in the ER -continue 5. With blood cell count of 24.7, tachycardia 110s,  blood pressure stable, lactate 1.2 6. Sputum culture and blood culture pending 2. COPD 1. VBG pending 2. Continue inhalers 3. Nebulizer treatment as needed 3. Tachycardia 1. Secondary to infection 2. EKG shows rate 104, rate 110s during my exam 3. Gentle hydration 4. Continue monitor 4. Hypertension 1. Continue Micardis, chlorthalidone, Matzim 5. Diabetes mellitus type 2 1. Hold Metformin 2. Sliding scale coverage    DVT Prophylaxis-heparin- SCDs   AM Labs Ordered, also please review Full Orders  Family Communication: No family at bedside  code Status:  FULL  Admission status: Observation  Time spent in minutes : 65   Evon Dejarnett B Zierle-Ghosh DO

## 2020-02-17 NOTE — ED Notes (Signed)
Pt not anxious, resting quietly.

## 2020-02-17 NOTE — Plan of Care (Signed)
  Problem: Education: Goal: Knowledge of General Education information will improve Description: Including pain rating scale, medication(s)/side effects and non-pharmacologic comfort measures Outcome: Progressing   Problem: Health Behavior/Discharge Planning: Goal: Ability to manage health-related needs will improve Outcome: Progressing   Problem: Clinical Measurements: Goal: Respiratory complications will improve Outcome: Progressing Goal: Cardiovascular complication will be avoided Outcome: Progressing   Problem: Activity: Goal: Risk for activity intolerance will decrease Outcome: Progressing   Problem: Nutrition: Goal: Adequate nutrition will be maintained Outcome: Progressing   Problem: Coping: Goal: Level of anxiety will decrease Outcome: Progressing   

## 2020-02-17 NOTE — Progress Notes (Signed)
ASSUMPTION OF CARE NOTE   02/17/2020 1:47 PM  Jeffery [name] was seen and examined.  The H&P by the admitting provider, orders, imaging was reviewed.  Please see new orders.  Will continue to follow.   Vitals:   02/17/20 1300 02/17/20 1330  BP: 110/62 114/79  Pulse: 99 (!) 110  Resp: 14 (!) 23  Temp:    SpO2: 99% 99%    Results for orders placed or performed during the hospital encounter of 02/16/20  Resp Panel by RT PCR (RSV, Flu A&B, Covid) - Nasopharyngeal Swab   Specimen: Nasopharyngeal Swab  Result Value Ref Range   SARS Coronavirus 2 by RT PCR NEGATIVE NEGATIVE   Influenza A by PCR NEGATIVE NEGATIVE   Influenza B by PCR NEGATIVE NEGATIVE   Respiratory Syncytial Virus by PCR NEGATIVE NEGATIVE  Blood culture (routine x 2)   Specimen: BLOOD RIGHT ARM  Result Value Ref Range   Specimen Description BLOOD RIGHT ARM    Special Requests      BOTTLES DRAWN AEROBIC AND ANAEROBIC Blood Culture adequate volume   Culture      NO GROWTH < 12 HOURS Performed at [facility], [address] [place], [place] [address]    Report Status PENDING   Blood culture (routine x 2)   Specimen: Left Antecubital; Blood  Result Value Ref Range   Specimen Description LEFT ANTECUBITAL    Special Requests      BOTTLES DRAWN AEROBIC AND ANAEROBIC Blood Culture adequate volume   Culture      NO GROWTH < 12 HOURS Performed at [facility], [address], [place], [place] [address]    Report Status PENDING   CBC with Differential  Result Value Ref Range   WBC 24.7 (H) 4.0 - 10.5 K/uL   RBC 4.61 4.22 - 5.81 MIL/uL   Hemoglobin 13.0 13.0 - 17.0 g/dL   HCT 86.5 39 - 52 %   MCV 89.4 80.0 - 100.0 fL   MCH 28.2 26.0 - 34.0 pg   MCHC 31.6 30.0 - 36.0 g/dL   RDW 78.4 (H) 69.6 - 29.5 %   Platelets 304 150 - 400 K/uL   nRBC 0.0 0.0 - 0.2 %   Neutrophils Relative % 88 %   Neutro Abs 21.6 (H) 1.7 - 7.7 K/uL   Lymphocytes Relative 4 %   Lymphs Abs 0.9 0.7 - 4.0 K/uL   Monocytes Relative 7 %    Monocytes Absolute 1.8 (H) 0.1 - 1.0 K/uL   Eosinophils Relative 0 %   Eosinophils Absolute 0.1 0.0 - 0.5 K/uL   Basophils Relative 0 %   Basophils Absolute 0.1 0.0 - 0.1 K/uL   Immature Granulocytes 1 %   Abs Immature Granulocytes 0.24 (H) 0.00 - 0.07 K/uL  Basic metabolic panel  Result Value Ref Range   Sodium 137 135 - 145 mmol/L   Potassium 4.3 3.5 - 5.1 mmol/L   Chloride 95 (L) 98 - 111 mmol/L   CO2 27 22 - 32 mmol/L   Glucose, Bld 120 (H) 70 - 99 mg/dL   BUN 19 8 - 23 mg/dL   Creatinine, Ser 2.84 0.61 - 1.24 mg/dL   Calcium 9.8 8.9 - 13.2 mg/dL   GFR, Estimated >44 >01 mL/min   Anion gap 15 5 - 15  Brain natriuretic peptide  Result Value Ref Range   B Natriuretic Peptide 78.0 0.0 - 100.0 pg/mL  Lactic acid, plasma  Result Value Ref Range   Lactic Acid,  Venous 1.2 0.5 - 1.9 mmol/L  Blood gas, venous  Result Value Ref Range   FIO2 28.00    pH, Ven 7.327 7.25 - 7.43   pCO2, Ven 63.0 (H) 44 - 60 mmHg   pO2, Ven <31.0 (LL) 32 - 45 mmHg   Bicarbonate 27.7 20.0 - 28.0 mmol/L   Acid-Base Excess 6.3 (H) 0.0 - 2.0 mmol/L   O2 Saturation 44.3 %   Patient temperature 37.0   HIV Antibody (routine testing w rflx)  Result Value Ref Range   HIV Screen 4th Generation wRfx Non Reactive Non Reactive  Comprehensive metabolic panel  Result Value Ref Range   Sodium 137 135 - 145 mmol/L   Potassium 4.1 3.5 - 5.1 mmol/L   Chloride 95 (L) 98 - 111 mmol/L   CO2 28 22 - 32 mmol/L   Glucose, Bld 93 70 - 99 mg/dL   BUN 19 8 - 23 mg/dL   Creatinine, Ser 1.61 0.61 - 1.24 mg/dL   Calcium 9.0 8.9 - 09.6 mg/dL   Total Protein 7.0 6.5 - 8.1 g/dL   Albumin 3.8 3.5 - 5.0 g/dL   AST 15 15 - 41 U/L   ALT 14 0 - 44 U/L   Alkaline Phosphatase 63 38 - 126 U/L   Total Bilirubin 1.1 0.3 - 1.2 mg/dL   GFR, Estimated >04 >54 mL/min   Anion gap 14 5 - 15  Magnesium  Result Value Ref Range   Magnesium 2.1 1.7 - 2.4 mg/dL  CBC WITH DIFFERENTIAL  Result Value Ref Range   WBC 22.3 (H) 4.0 - 10.5 K/uL    RBC 4.01 (L) 4.22 - 5.81 MIL/uL   Hemoglobin 11.5 (L) 13.0 - 17.0 g/dL   HCT 09.8 (L) 39 - 52 %   MCV 91.3 80.0 - 100.0 fL   MCH 28.7 26.0 - 34.0 pg   MCHC 31.4 30.0 - 36.0 g/dL   RDW 11.9 (H) 14.7 - 82.9 %   Platelets 294 150 - 400 K/uL   nRBC 0.0 0.0 - 0.2 %   Neutrophils Relative % 87 %   Neutro Abs 19.5 (H) 1.7 - 7.7 K/uL   Lymphocytes Relative 4 %   Lymphs Abs 0.8 0.7 - 4.0 K/uL   Monocytes Relative 7 %   Monocytes Absolute 1.5 (H) 0.1 - 1.0 K/uL   Eosinophils Relative 1 %   Eosinophils Absolute 0.2 0.0 - 0.5 K/uL   Basophils Relative 0 %   Basophils Absolute 0.1 0.0 - 0.1 K/uL   Immature Granulocytes 1 %   Abs Immature Granulocytes 0.30 (H) 0.00 - 0.07 K/uL  Blood gas, arterial  Result Value Ref Range   FIO2 32.00    pH, Arterial 7.379 7.35 - 7.45   pCO2 arterial 49.0 (H) 32 - 48 mmHg   pO2, Arterial 96.2 83 - 108 mmHg   Bicarbonate 27.1 20.0 - 28.0 mmol/L   Acid-Base Excess 3.4 (H) 0.0 - 2.0 mmol/L   O2 Saturation 97.2 %   Patient temperature 37.4    Allens test (pass/fail) PASS PASS  CBG monitoring, ED  Result Value Ref Range   Glucose-Capillary 78 70 - 99 mg/dL  CBG monitoring, ED  Result Value Ref Range   Glucose-Capillary 112 (H) 70 - 99 mg/dL   Maryln Manuel, MD [facility]   02/16/2020  7:15 PM How to contact the [facility] Attending or Consulting provider 7A - 7P or covering provider during after hours 7P -7A, for this patient?  1. Check  the care team in [facility] and look for a) attending/consulting [facility] provider listed and b) the [facility] team listed 2. Log into [email or web address] and use [facility] universal password to access. If you do not have the password, please contact the hospital operator. 3. Locate the [facility] provider you are looking for under [facility] and page to a number that you can be directly reached. 4. If you still have difficulty reaching the provider, please page the [facility] (Director on Call) for the Hospitalists listed on amion for  assistance.

## 2020-02-18 ENCOUNTER — Encounter (HOSPITAL_COMMUNITY): Payer: Self-pay | Admitting: Family Medicine

## 2020-02-18 ENCOUNTER — Ambulatory Visit: Payer: Medicare Other | Admitting: Internal Medicine

## 2020-02-18 DIAGNOSIS — I10 Essential (primary) hypertension: Secondary | ICD-10-CM

## 2020-02-18 DIAGNOSIS — R7303 Prediabetes: Secondary | ICD-10-CM

## 2020-02-18 DIAGNOSIS — E43 Unspecified severe protein-calorie malnutrition: Secondary | ICD-10-CM | POA: Insufficient documentation

## 2020-02-18 DIAGNOSIS — F411 Generalized anxiety disorder: Secondary | ICD-10-CM

## 2020-02-18 DIAGNOSIS — J189 Pneumonia, unspecified organism: Secondary | ICD-10-CM | POA: Diagnosis not present

## 2020-02-18 DIAGNOSIS — Z9981 Dependence on supplemental oxygen: Secondary | ICD-10-CM

## 2020-02-18 DIAGNOSIS — D72829 Elevated white blood cell count, unspecified: Secondary | ICD-10-CM | POA: Diagnosis not present

## 2020-02-18 DIAGNOSIS — K219 Gastro-esophageal reflux disease without esophagitis: Secondary | ICD-10-CM | POA: Diagnosis present

## 2020-02-18 DIAGNOSIS — Z720 Tobacco use: Secondary | ICD-10-CM

## 2020-02-18 LAB — HEMOGLOBIN A1C
Hgb A1c MFr Bld: 5.8 % — ABNORMAL HIGH (ref 4.8–5.6)
Mean Plasma Glucose: 120 mg/dL

## 2020-02-18 LAB — GLUCOSE, CAPILLARY
Glucose-Capillary: 81 mg/dL (ref 70–99)
Glucose-Capillary: 109 mg/dL — ABNORMAL HIGH (ref 70–99)
Glucose-Capillary: 186 mg/dL — ABNORMAL HIGH (ref 70–99)

## 2020-02-18 MED ORDER — ATORVASTATIN CALCIUM 10 MG PO TABS
10.0000 mg | ORAL_TABLET | Freq: Every day | ORAL | Status: DC
  Administered 2020-02-18 – 2020-02-19 (×2): 10 mg via ORAL
  Filled 2020-02-18 (×2): qty 1

## 2020-02-18 MED ORDER — ACETAMINOPHEN 325 MG PO TABS: 650.0000 mg | ORAL_TABLET | Freq: Four times a day (QID) | ORAL | Status: DC | PRN

## 2020-02-18 MED ORDER — ACETAMINOPHEN 500 MG PO TABS
1000.0000 mg | ORAL_TABLET | Freq: Once | ORAL | Status: AC
  Administered 2020-02-18: 1000 mg via ORAL
  Filled 2020-02-18: qty 2

## 2020-02-18 MED ORDER — ENSURE ENLIVE PO LIQD
237.0000 mL | Freq: Two times a day (BID) | ORAL | Status: DC
  Administered 2020-02-18 – 2020-02-19 (×3): 237 mL via ORAL

## 2020-02-18 NOTE — Plan of Care (Signed)
  Problem: Acute Rehab PT Goals(only PT should resolve) Goal: Pt Will Ambulate Outcome: Progressing Flowsheets (Taken 02/18/2020 1238) Pt will Ambulate:  > 125 feet  Independently   Tori Ronnel Zuercher PT, DPT 02/18/20, 12:38 PM [phone]

## 2020-02-18 NOTE — Progress Notes (Addendum)
PROGRESS NOTE   Jeffery [name]  JAS:[id number] DOB: 01/23/1951 DOA: 02/16/2020 PCP: Toma Deiters, MD   Chief Complaint  Patient presents with  . Shortness of Breath    Brief Admission History:  69 y.o. male, with history of hypertension, hyperlipidemia, GERD, COPD, and more presents to the ED with a chief complaint of dyspnea.  Patient is on chronic 2 L nasal cannula.  He also uses inhalers and nebulizers for his COPD.  He reports that he started feeling dyspneic yesterday.  It has been constant and worse through today.  Reports that its worse with exertion, better with rest.  He has tried his nebulizers but they have not offered any relief.  He has an associated dry cough.  He does report body aches as well and a decreased appetite.  He denies any chest pain, palpitations, lightheadedness, change in urine or bowel movements, nausea and vomiting.  Assessment & Plan:   Principal Problem:   CAP (community acquired pneumonia) Active Problems:   Tobacco abuse   Hyperlipidemia   Essential hypertension   Prediabetes   Bilateral pneumonia   Leukocytosis   On home O2   GERD (gastroesophageal reflux disease)   GAD (generalized anxiety disorder)  1. Sepsis secondary to pneumonia - Pt presented with WBC 24, HR 110, CXR findings of bilateral pneumonia - He is responding well to IV antibiotics and supportive measures.  Follow cultures. No growth to date.  2. CAP - continue azithromycin and ceftriaxone and supportive measures.  3. Fever - added tylenol for symptoms.  4. Leukocytosis - slight down trend today, continue to follow.  5. Tachycardia - he has been restarted on his home diltiazem with good results.  6. Severe protein calorie malnutrition - dietitian consult requested.  7. Generalized weakness - PT evaluation pending.  8. Prediabetes - stable. a1c 5.8%  DVT prophylaxis: sQ heparin Code Status: Full Code  Family Communication: wife at bedside Disposition: Home   Status is:  Inpatient  Remains inpatient appropriate because:IV treatments appropriate due to intensity of illness or inability to take PO, Inpatient level of care appropriate due to severity of illness and Febrile  Dispo:  Patient From: Home  Planned Disposition: Home  Expected discharge date: 02/18/20  Medically stable for discharge: No  Consultants:     Procedures:     Antimicrobials:  Ceftriaxone 10/18>> Azithromycin 10/18>>   Subjective: Pt reports having fever this morning.  Cough only slightly improved.   Objective: Vitals:   02/17/20 2112 02/18/20 0134 02/18/20 0517 02/18/20 0909  BP: 116/79 128/77 (!) 159/79 126/75  Pulse: 92 88 (!) 106 94  Resp: 18 18 20 16   Temp: 98.9 F (37.2 C) 99.2 F (37.3 C) (!) 101.6 F (38.7 C) 99 F (37.2 C)  TempSrc:   Oral   SpO2: 99% 100% 96% 97%  Weight:      Height:        Intake/Output Summary (Last 24 hours) at 02/18/2020 1209 Last data filed at 02/18/2020 0542 Gross per 24 hour  Intake 1249.33 ml  Output --  Net 1249.33 ml   Filed Weights   02/16/20 1558  Weight: 50.8 kg   Examination:  General exam: emaciated, chronically ill appearing male, awake, alert, Appears calm and comfortable  Respiratory system: bibasilar rales. No increased work of breathing. Cardiovascular system: S1 & S2 heard, tachcyardic. No JVD, murmurs, rubs, gallops or clicks. No pedal edema. Gastrointestinal system: Abdomen is nondistended, soft and nontender. No organomegaly or masses felt. Normal  bowel sounds heard. Central nervous system: Alert and oriented. No focal neurological deficits. Extremities: Symmetric 5 x 5 power. Skin: No rashes, lesions or ulcers Psychiatry: Judgement and insight appear normal. Mood & affect appropriate.   Data Reviewed: I have personally reviewed following labs and imaging studies  CBC: Recent Labs  Lab 02/16/20 1945 02/17/20 0343  WBC 24.7* 22.3*  NEUTROABS 21.6* 19.5*  HGB 13.0 11.5*  HCT 41.2 36.6*  MCV  89.4 91.3  PLT 304 294    Basic Metabolic Panel: Recent Labs  Lab 02/16/20 1945 02/17/20 0343  NA 137 137  K 4.3 4.1  CL 95* 95*  CO2 27 28  GLUCOSE 120* 93  BUN 19 19  CREATININE 0.86 0.87  CALCIUM 9.8 9.0  MG  --  2.1    GFR: Estimated Creatinine Clearance: 59.2 mL/min (by C-G formula based on SCr of 0.87 mg/dL).  Liver Function Tests: Recent Labs  Lab 02/17/20 0343  AST 15  ALT 14  ALKPHOS 63  BILITOT 1.1  PROT 7.0  ALBUMIN 3.8    CBG: Recent Labs  Lab 02/17/20 0757 02/17/20 1157 02/17/20 1644 02/17/20 2114 02/18/20 0812  GLUCAP 78 112* 113* 104* 81    Recent Results (from the past 240 hour(s))  Blood culture (routine x 2)     Status: None (Preliminary result)   Collection Time: 02/16/20 11:27 PM   Specimen: BLOOD RIGHT ARM  Result Value Ref Range Status   Specimen Description BLOOD RIGHT ARM  Final   Special Requests   Final    BOTTLES DRAWN AEROBIC AND ANAEROBIC Blood Culture adequate volume   Culture   Final    NO GROWTH 2 DAYS Performed at [facility], [address], [place], [place] [address]    Report Status PENDING  Incomplete  Blood culture (routine x 2)     Status: None (Preliminary result)   Collection Time: 02/16/20 11:33 PM   Specimen: Left Antecubital; Blood  Result Value Ref Range Status   Specimen Description LEFT ANTECUBITAL  Final   Special Requests   Final    BOTTLES DRAWN AEROBIC AND ANAEROBIC Blood Culture adequate volume   Culture   Final    NO GROWTH 2 DAYS Performed at [facility], [address] [place], [place] [address]    Report Status PENDING  Incomplete  Resp Panel by RT PCR (RSV, Flu A&B, Covid) - Nasopharyngeal Swab     Status: None   Collection Time: 02/16/20 11:56 PM   Specimen: Nasopharyngeal Swab  Result Value Ref Range Status   SARS Coronavirus 2 by RT PCR NEGATIVE NEGATIVE Final    Comment: (NOTE) SARS-CoV-2 target nucleic acids are NOT DETECTED.  The SARS-CoV-2 RNA is generally detectable in  upper respiratoy specimens during the acute phase of infection. The lowest concentration of SARS-CoV-2 viral copies this assay can detect is 131 copies/mL. A negative result does not preclude SARS-Cov-2 infection and should not be used as the sole basis for treatment or other patient management decisions. A negative result may occur with  improper specimen collection/handling, submission of specimen other than nasopharyngeal swab, presence of viral mutation(s) within the areas targeted by this assay, and inadequate number of viral copies (<131 copies/mL). A negative result must be combined with clinical observations, patient history, and epidemiological information. The expected result is Negative.  Fact Sheet for Patients:  https://www.moore.com/  Fact Sheet for Healthcare Providers:  https://www.young.biz/  This test is no t yet approved or cleared by the [place]  States FDA and  has been authorized for detection and/or diagnosis of SARS-CoV-2 by FDA under an Emergency Use Authorization (EUA). This EUA will remain  in effect (meaning this test can be used) for the duration of the COVID-19 declaration under Section 564(b)(1) of the Act, 21 U.S.C. section 360bbb-3(b)(1), unless the authorization is terminated or revoked sooner.     Influenza A by PCR NEGATIVE NEGATIVE Final   Influenza B by PCR NEGATIVE NEGATIVE Final    Comment: (NOTE) The Xpert Xpress SARS-CoV-2/FLU/RSV assay is intended as an aid in  the diagnosis of influenza from Nasopharyngeal swab specimens and  should not be used as a sole basis for treatment. Nasal washings and  aspirates are unacceptable for Xpert Xpress SARS-CoV-2/FLU/RSV  testing.  Fact Sheet for Patients: https://www.moore.com/https://www.fda.gov/media/142436/download  Fact Sheet for Healthcare Providers: https://www.young.biz/https://www.fda.gov/media/142435/download  This test is not yet approved or cleared by the [place] FDA and  has been  authorized for detection and/or diagnosis of SARS-CoV-2 by  FDA under an Emergency Use Authorization (EUA). This EUA will remain  in effect (meaning this test can be used) for the duration of the  Covid-19 declaration under Section 564(b)(1) of the Act, 21  U.S.C. section 360bbb-3(b)(1), unless the authorization is  terminated or revoked.    Respiratory Syncytial Virus by PCR NEGATIVE NEGATIVE Final    Comment: (NOTE) Fact Sheet for Patients: https://www.moore.com/https://www.fda.gov/media/142436/download  Fact Sheet for Healthcare Providers: https://www.young.biz/https://www.fda.gov/media/142435/download  This test is not yet approved or cleared by the [place] FDA and  has been authorized for detection and/or diagnosis of SARS-CoV-2 by  FDA under an Emergency Use Authorization (EUA). This EUA will remain  in effect (meaning this test can be used) for the duration of the  COVID-19 declaration under Section 564(b)(1) of the Act, 21 U.S.C.  section 360bbb-3(b)(1), unless the authorization is terminated or  revoked. Performed at [facility], [address] [place], [place] [address]      Radiology Studies: DG Chest 2 View  Result Date: 02/16/2020 CLINICAL DATA:  Shortness of breath EXAM: CHEST - 2 VIEW COMPARISON:  08/08/2019 FINDINGS: Bibasilar airspace opacities. No effusions. Heart is normal size. No acute bony abnormality. IMPRESSION: Bilateral lower lobe airspace opacities, atelectasis versus infiltrates/pneumonia. Electronically Signed   By: Charlett NoseKevin  Dover M.D.   On: 02/16/2020 20:54     Scheduled Meds: . aspirin EC  81 mg Oral Daily  . atorvastatin  10 mg Oral QHS  . chlorthalidone  12.5 mg Oral Daily  . diltiazem  240 mg Oral Daily  . famotidine  40 mg Oral QPC supper  . gabapentin  300 mg Oral TID  . heparin  5,000 Units Subcutaneous Q8H  . influenza vaccine adjuvanted  0.5 mL Intramuscular Tomorrow-1000  . insulin aspart  0-9 Units Subcutaneous TID WC  . irbesartan  150 mg Oral Daily  . isosorbide  mononitrate  30 mg Oral Daily  . mirtazapine  7.5 mg Oral QHS  . multivitamin with minerals  1 tablet Oral Daily  . predniSONE  5 mg Oral Q breakfast   Continuous Infusions: . sodium chloride 10 mL/hr at 02/17/20 1016  . azithromycin Stopped (02/17/20 2329)  . cefTRIAXone (ROCEPHIN)  IV Stopped (02/17/20 2208)     LOS: 1 day   Time spent: 30 minutes    Gentri Guardado Laural BenesJohnson, MD How to contact the [facility] Attending or Consulting provider 7A - 7P or covering provider during after hours 7P -7A, for this patient?  1. Check the care team in  [facility] and look for a) attending/consulting [facility] provider listed and b) the [facility] team listed 2. Log into [email or web address] and use [facility]'s universal password to access. If you do not have the password, please contact the hospital operator. 3. Locate the [facility] provider you are looking for under [facility] and page to a number that you can be directly reached. 4. If you still have difficulty reaching the provider, please page the [facility] (Director on Call) for the Hospitalists listed on amion for assistance.  02/18/2020, 12:09 PM

## 2020-02-18 NOTE — Progress Notes (Addendum)
Initial Nutrition Assessment  DOCUMENTATION CODES:   Severe malnutrition in context of chronic illness  INTERVENTION:  Ensure Enlive po BID, each supplement provides 350 kcal and 20 grams of protein  Recommend he continue high calorie / high protein oral supplement at home daily at least 30 days.  NUTRITION DIAGNOSIS:   Severe Malnutrition related to chronic illness, poor appetite (COPD on chronic O2.) as evidenced by severe fat depletion, severe muscle depletion.   GOAL:    Pt to meet >/= 90% of their estimated nutrition needs    MONITOR:  PO intake, Supplement acceptance, Labs, Weight trends, Skin   REASON FOR ASSESSMENT:   Consult Assessment of nutrition requirement/status  ASSESSMENT: Patient is an underweight 69 yo male with hx of HTN, DM-2, GERD, COPD chronic O2 @ 2-3 liters. Presents with dyspnea. Chest x-ray findings:CAP.   Patient reports regular diet at home. His appetite has not been good and he is taking Remeron. Able to feed himself. Meal intake < 50% currently. Patient likes Ensure and is agreeable to support his nutrition with ONS. Recommend he continue high calorie/ high protein nutrition shake at home twice daily.    Medications reviewed and include: Pepcid, Insulin, Remeron, MVI, Prednisone.  Drips: Zithromax, Rocephin  Patient weight has ranged between 49-52 kg over the past year according to records. Distant history of moderate malnutrition. Patient remains underweight.   Labs: BMP Latest Ref Rng & Units 02/17/2020 02/16/2020 07/26/2019  Glucose 70 - 99 mg/dL 93 629(B) 284(X)  BUN 8 - 23 mg/dL 19 19 12   Creatinine 0.61 - 1.24 mg/dL 3.24 4.01)  Sodium 135 - 145 mmol/L 137 137 130(L)  Potassium 3.5 - 5.1 mmol/L 4.1 4.3 3.4(L)  Chloride 98 - 111 mmol/L 95(L) 95(L) 89(L)  CO2 22 - 32 mmol/L 28 27 31   Calcium 8.9 - 10.3 mg/dL 9.0 9.8 9.1    NUTRITION - FOCUSED PHYSICAL EXAM:    Most Recent Value  Orbital Region Moderate depletion  Upper Arm  Region Severe depletion  Thoracic and Lumbar Region Severe depletion  Buccal Region Mild depletion  Temple Region Moderate depletion  Clavicle Bone Region Severe depletion  Clavicle and Acromion Bone Region Severe depletion  Scapular Bone Region Severe depletion  Dorsal Hand Mild depletion  Patellar Region Severe depletion  Anterior Thigh Region Severe depletion  Posterior Calf Region Moderate depletion  Edema (RD Assessment) Mild BLE  Hair Reviewed  Eyes Reviewed  Mouth Reviewed  Skin Reviewed  Nails Reviewed      Diet Order:   Diet Order            Diet heart healthy/carb modified Room service appropriate? Yes; Fluid consistency: Thin  Diet effective now                 EDUCATION NEEDS:   Education needs have been addressed Skin:  Skin Assessment: Reviewed RN Assessment  Last BM:  10/18  Height:   Ht Readings from Last 1 Encounters:  02/16/20 5\' 6"  (1.676 m)    Weight:   Wt Readings from Last 1 Encounters:  02/16/20 50.8 kg    Ideal Body Weight:   65 kg  BMI:  Body mass index is 18.08 kg/m.  Estimated Nutritional Needs:   Kcal:  02/18/20 (35-38 kcal/kg/bw)  Protein:  77-85 gr  Fluid:  >1500 ml daily   MS,RD,CSG,LDN Pager: 02/18/20

## 2020-02-18 NOTE — Evaluation (Signed)
Physical Therapy Evaluation Patient Details Name: Jeffery [name] MRN: [id number] DOB: 03/01/1951 Today's Date: 02/18/2020   History of Present Illness  69 y.o. male with PMH of HTN, HLD, GERD, COPD, bil cataract removal in Feb 2021, and on 2L O2 at home. Pt presents to ED with complaint of dyspnea; worse with exertion and better with rest.  Clinical Impression  Pt admitted with above diagnosis. Pt slightly below baseline, able to ambulate with slightly increased time and therapist holding O2 tank. Pt without LOB and able to continue conversation while ambulating on 2L and SpO2 93-94%, but limited in distance compared to baseline per pt. Pt currently with functional limitations due to the deficits listed below (see PT Problem List). Pt will benefit from skilled PT to increase their independence and safety with mobility to allow discharge to the venue listed below.       Follow Up Recommendations No PT follow up    Equipment Recommendations  None recommended by PT    Recommendations for Other Services       Precautions / Restrictions Precautions Precautions: Other (comment) Precaution Comments: chronic O2 Restrictions Weight Bearing Restrictions: No      Mobility  Bed Mobility Overal bed mobility: Modified Independent   General bed mobility comments: supine<>sit without cues, slightly increased time  Transfers Overall transfer level: Modified independent Equipment used: None   Ambulation/Gait Ambulation/Gait assistance: Supervision Gait Distance (Feet): 200 Feet Assistive device: None Gait Pattern/deviations: WFL(Within Functional Limits);Narrow base of support Gait velocity: slightly decreased   General Gait Details: slightly decreased cadence with narrow BOS but overall WFL, mild SOB noted while ambulating and able to continue conversation, on 2L O2 with SpO2 93-94%  Stairs            Wheelchair Mobility    Modified Rankin (Stroke Patients Only)        Balance Overall balance assessment: No apparent balance deficits (not formally assessed)        Pertinent Vitals/Pain Pain Assessment: No/denies pain    Home Living Family/patient expects to be discharged to:: Private residence Living Arrangements: Spouse/significant other Available Help at Discharge: Family;Available 24 hours/day Type of Home: House Home Access: Level entry     Home Layout: One level Home Equipment: Shower seat;Other (comment) (rollator, o2)      Prior Function Level of Independence: Independent         Comments: Pt reports independence with community ambulation, ADLs, drives, on 2L O2 24/7.Marland Kitchen Spouse will assist with washing pt's back and pt prefers to sit while showering due to fatigue.     Hand Dominance        Extremity/Trunk Assessment   Upper Extremity Assessment Upper Extremity Assessment: Overall WFL for tasks assessed    Lower Extremity Assessment Lower Extremity Assessment: Overall WFL for tasks assessed (AROM WNL, strength 4+/5 throughout)    Cervical / Trunk Assessment Cervical / Trunk Assessment: Normal  Communication   Communication: No difficulties  Cognition Arousal/Alertness: Awake/alert Behavior During Therapy: WFL for tasks assessed/performed Overall Cognitive Status: Within Functional Limits for tasks assessed       General Comments General comments (skin integrity, edema, etc.): on 2L O2 with SpO2 93-94% with ambulation    Exercises     Assessment/Plan    PT Assessment Patient needs continued PT services  PT Problem List Decreased activity tolerance;Cardiopulmonary status limiting activity       PT Treatment Interventions DME instruction;Gait training;Functional mobility training;Therapeutic activities;Therapeutic exercise;Balance training;Patient/family education    PT Goals (  Current goals can be found in the Care Plan section)  Acute Rehab PT Goals Patient Stated Goal: return home with spouse PT Goal  Formulation: With patient Time For Goal Achievement: 02/25/20 Potential to Achieve Goals: Good    Frequency Min 3X/week   Barriers to discharge        Co-evaluation               AM-PAC PT "6 Clicks" Mobility  Outcome Measure Help needed turning from your back to your side while in a flat bed without using bedrails?: None Help needed moving from lying on your back to sitting on the side of a flat bed without using bedrails?: None Help needed moving to and from a bed to a chair (including a wheelchair)?: None Help needed standing up from a chair using your arms (e.g., wheelchair or bedside chair)?: None Help needed to walk in hospital room?: None Help needed climbing 3-5 steps with a railing? : None 6 Click Score: 24    End of Session Equipment Utilized During Treatment: Oxygen Activity Tolerance: Patient tolerated treatment well Patient left: in bed;with call bell/phone within reach Nurse Communication: Mobility status PT Visit Diagnosis: Other abnormalities of gait and mobility (R26.89)    Time: 4174-0814 PT Time Calculation (min) (ACUTE ONLY): 23 min   Charges:   PT Evaluation $PT Eval Low Complexity: 1 Low PT Treatments $Gait Training: 8-22 mins         Domenick Bookbinder PT, DPT 02/18/20, 12:35 PM [phone]

## 2020-02-18 NOTE — Progress Notes (Signed)
   02/18/20 0517  Assess: MEWS Score  Temp (!) 101.6 F (38.7 C)  BP (!) 159/79  Pulse Rate (!) 106  Resp 20  Level of Consciousness Alert  SpO2 96 %  O2 Device Nasal Cannula  O2 Flow Rate (L/min) 2 L/min  Assess: MEWS Score  MEWS Temp 2  MEWS Systolic 0  MEWS Pulse 1  MEWS RR 0  MEWS LOC 0  MEWS Score 3  MEWS Score Color Yellow  Take Vital Signs  Increase Vital Sign Frequency  Yellow: Q 2hr X 2 then Q 4hr X 2, if remains yellow, continue Q 4hrs  Escalate  MEWS: Escalate Yellow: discuss with charge nurse/RN and consider discussing with provider and RRT  Notify: Charge Nurse/RN  Name of Charge Nurse/RN Notified Edgar Frisk RN  Date Charge Nurse/RN Notified 02/18/20  Time Charge Nurse/RN Notified 0526  Notify: Provider  Provider Name/Title Asia Zierle-Ghosh  Date Provider Notified 02/18/20  Time Provider Notified [phone]  Notification Type Page (Textpage)  Notification Reason Change in status  Response See new orders  Date of Provider Response 02/18/20  Time of Provider Response [phone]

## 2020-02-19 ENCOUNTER — Inpatient Hospital Stay (HOSPITAL_COMMUNITY): Payer: Medicare Other

## 2020-02-19 DIAGNOSIS — Z720 Tobacco use: Secondary | ICD-10-CM | POA: Diagnosis not present

## 2020-02-19 DIAGNOSIS — I10 Essential (primary) hypertension: Secondary | ICD-10-CM | POA: Diagnosis not present

## 2020-02-19 DIAGNOSIS — E43 Unspecified severe protein-calorie malnutrition: Secondary | ICD-10-CM

## 2020-02-19 DIAGNOSIS — Z9981 Dependence on supplemental oxygen: Secondary | ICD-10-CM | POA: Diagnosis not present

## 2020-02-19 LAB — BLOOD CULTURE ID PANEL (REFLEXED) - BCID2

## 2020-02-19 LAB — GLUCOSE, CAPILLARY
Glucose-Capillary: 102 mg/dL — ABNORMAL HIGH (ref 70–99)
Glucose-Capillary: 177 mg/dL — ABNORMAL HIGH (ref 70–99)
Glucose-Capillary: 185 mg/dL — ABNORMAL HIGH (ref 70–99)

## 2020-02-19 LAB — CBC WITH DIFFERENTIAL/PLATELET
Abs Immature Granulocytes: 0.04 10*3/uL (ref 0.00–0.07)
Basophils Absolute: 0 10*3/uL (ref 0.0–0.1)
Basophils Relative: 0 %
Eosinophils Absolute: 0.5 10*3/uL (ref 0.0–0.5)
Eosinophils Relative: 5 %
HCT: 33.6 % — ABNORMAL LOW (ref 39.0–52.0)
Hemoglobin: 10.4 g/dL — ABNORMAL LOW (ref 13.0–17.0)
Immature Granulocytes: 0 %
Lymphocytes Relative: 10 %
Lymphs Abs: 1.1 10*3/uL (ref 0.7–4.0)
MCH: 28 pg (ref 26.0–34.0)
MCHC: 31 g/dL (ref 30.0–36.0)
MCV: 90.6 fL (ref 80.0–100.0)
Monocytes Absolute: 1.2 10*3/uL — ABNORMAL HIGH (ref 0.1–1.0)
Monocytes Relative: 11 %
Neutro Abs: 8.2 10*3/uL — ABNORMAL HIGH (ref 1.7–7.7)
Neutrophils Relative %: 74 %
Platelets: 322 10*3/uL (ref 150–400)
RBC: 3.71 MIL/uL — ABNORMAL LOW (ref 4.22–5.81)
RDW: 14.8 % (ref 11.5–15.5)
WBC: 11.2 10*3/uL — ABNORMAL HIGH (ref 4.0–10.5)
nRBC: 0 % (ref 0.0–0.2)

## 2020-02-19 LAB — BASIC METABOLIC PANEL
Anion gap: 14 (ref 5–15)
BUN: 15 mg/dL (ref 8–23)
CO2: 31 mmol/L (ref 22–32)
Calcium: 9.3 mg/dL (ref 8.9–10.3)
Chloride: 92 mmol/L — ABNORMAL LOW (ref 98–111)
Creatinine, Ser: 0.85 mg/dL (ref 0.61–1.24)
GFR, Estimated: >60 mL/min (ref >60)
Glucose, Bld: 99 mg/dL (ref 70–99)
Potassium: 3.6 mmol/L (ref 3.5–5.1)
Sodium: 137 mmol/L (ref 135–145)

## 2020-02-19 MED ORDER — IOHEXOL 9 MG/ML PO SOLN
ORAL | Status: AC
  Filled 2020-02-19: qty 1000

## 2020-02-19 MED ORDER — IOHEXOL 300 MG/ML  SOLN
100.0000 mL | Freq: Once | INTRAMUSCULAR | Status: AC | PRN
  Administered 2020-02-19: 80 mL via INTRAVENOUS

## 2020-02-19 NOTE — Progress Notes (Addendum)
PROGRESS NOTE  Jeffery FiscalMichael L Couper ZOX:[id number] DOB: October 20, 1950 DOA: 02/16/2020 PCP: Toma DeitersHasanaj, Xaje A, MD  Brief History:  69 year old male with a history of hypertension, hyperlipidemia, COPD, diet-controlled diabetes mellitus type 2, chronic with hypoxia on 2 L, tobacco abuse presenting with 1 day history of shortness of breath and wheezing with associated nonproductive cough.  He also complained of some myalgias and decreased oral intake.  He had denied any chest pain, palpitations, nausea, vomiting, diarrhea, abdominal pain, dysuria. In the emergency department, the patient had low-grade temperature of 99.3 F.  He was hemodynamically stable with heart rate of 110.  WBC was 24.7.  He was 97% on 2 L.  Chest x-ray showed bibasilar lower lobe opacities.  The patient was started on ceftriaxone and azithromycin.  Assessment/Plan: Sepsis -Present on admission -Presented with tachycardia and leukocytosis -Secondary to pneumonia -he is COVID vaccinated  Lobar pneumonia -Continue ceftriaxone and azithromycin -Personally reviewed chest x-ray--bibasilar opacities  Bacteremia -one of four sets--likely contaminant  Essential hypertension -Continue diltiazem and chlorthalidone -Continue ARB  Diabetes mellitus type 2 -Holding metformin -02/17/2020 hemoglobin A1c 5.8  COPD -30 pack year hx -stopped smoking 5 yrs ago  Chronic respiratory failure with hypoxia -stable on 2L at home -on 2L currently  Hyperlipidemia -continue statin  Abdominal pain -CT abd/pelvis       Status is: Inpatient  Remains inpatient appropriate because:IV treatments appropriate due to intensity of illness or inability to take PO   Dispo:  Patient From: Home  Planned Disposition: Home  Expected discharge date: 02/20/20  Medically stable for discharge: No      Total time spent 35 minutes.  Greater than 50% spent face to face counseling and coordinating care.    Family  Communication:   Spouse updated at bedside 10/20  Consultants:  none  Code Status:  FULL   DVT Prophylaxis:  [place] Heparin    Procedures: As Listed in Progress Note Above  Antibiotics: Ceftriaxone 10/18>> azithro 10/18>>     Subjective: Patient complains of intermittent abd pain.  Denies f/c, cp, hemoptysis.  Has cough and some dyspnea on exertion.  Denies dysuria, hematuria  Objective: Vitals:   02/18/20 2122 02/19/20 0616 02/19/20 0952 02/19/20 1351  BP: 115/83 127/79 139/83 115/74  Pulse: 80 80 86 92  Resp: 16 20 20    Temp: 98.9 F (37.2 C) 99.3 F (37.4 C) 98.8 F (37.1 C) 98.7 F (37.1 C)  TempSrc: Oral Oral Oral Oral  SpO2: 100% 100% 98% 97%  Weight:      Height:        Intake/Output Summary (Last 24 hours) at 02/19/2020 1533 Last data filed at 02/19/2020 1300 Gross per 24 hour  Intake 120 ml  Output 920 ml  Net -800 ml   Weight change:  Exam:   General:  Pt is alert, follows commands appropriately, not in acute distress  HEENT: No icterus, No thrush, No neck mass, [place]/AT  Cardiovascular: RRR, S1/S2, no rubs, no gallops  Respiratory: CTA bilaterally, no wheezing, no crackles, no rhonchi  Abdomen: Soft/+BS, non tender, non distended, no guarding  Extremities: No edema, No lymphangitis, No petechiae, No rashes, no synovitis   Data Reviewed: I have personally reviewed following labs and imaging studies Basic Metabolic Panel: Recent Labs  Lab 02/16/20 1945 02/17/20 0343 02/19/20 0431  NA 137 137 137  K 4.3 4.1 3.6  CL 95* 95* 92*  CO2 27 28 31   GLUCOSE 120* 93  99  BUN CREATININE 0.86 0.87 0.85  CALCIUM 9.8 9.0 9.3  MG  --  2.1  --    Liver Function Tests: Recent Labs  Lab 02/17/20 0343  AST 15  ALT 14  ALKPHOS 63  BILITOT 1.1  PROT 7.0  ALBUMIN 3.8   No results for input(s): LIPASE, AMYLASE in the last 168 hours. No results for input(s): AMMONIA in the last 168 hours. Coagulation Profile: No results for input(s):  INR, PROTIME in the last 168 hours. CBC: Recent Labs  Lab 02/16/20 1945 02/17/20 0343 02/19/20 0431  WBC 24.7* 22.3* 11.2*  NEUTROABS 21.6* 19.5* 8.2*  HGB 13.0 11.5* 10.4*  HCT 41.2 36.6* 33.6*  MCV 89.4 91.3 90.6  PLT 304 294 322   Cardiac Enzymes: No results for input(s): CKTOTAL, CKMB, CKMBINDEX, TROPONINI in the last 168 hours. BNP: Invalid input(s): POCBNP CBG: Recent Labs  Lab 02/18/20 0812 02/18/20 1147 02/18/20 1658 02/19/20 0739 02/19/20 1109  GLUCAP 81 109* 186* 102* 177*   HbA1C: Recent Labs    02/17/20 0343  HGBA1C 5.8*   Urine analysis:    Component Value Date/Time   COLORURINE YELLOW 06/20/2018 1202   APPEARANCEUR CLEAR 06/20/2018 1202   LABSPEC 1.008 06/20/2018 1202   PHURINE 8.0 06/20/2018 1202   GLUCOSEU NEGATIVE 06/20/2018 1202   HGBUR NEGATIVE 06/20/2018 1202   BILIRUBINUR NEGATIVE 06/20/2018 1202   KETONESUR NEGATIVE 06/20/2018 1202   PROTEINUR NEGATIVE 06/20/2018 1202   UROBILINOGEN 0.2 04/12/2011 1548   NITRITE NEGATIVE 06/20/2018 1202   LEUKOCYTESUR NEGATIVE 06/20/2018 1202   Sepsis Labs: (procalcitonin:4,lacticidven:4) ) Recent Results (from the past 240 hour(s))  Blood culture (routine x 2)     Status: None (Preliminary result)   Collection Time: 02/16/20 11:27 PM   Specimen: BLOOD RIGHT ARM  Result Value Ref Range Status   Specimen Description BLOOD RIGHT ARM  Final   Special Requests   Final    BOTTLES DRAWN AEROBIC AND ANAEROBIC Blood Culture adequate volume   Culture   Final    NO GROWTH 3 DAYS Performed at [facility], [address] [place], [place] [address]    Report Status PENDING  Incomplete  Blood culture (routine x 2)     Status: None (Preliminary result)   Collection Time: 02/16/20 11:33 PM   Specimen: Left Antecubital; Blood  Result Value Ref Range Status   Specimen Description LEFT ANTECUBITAL  Final   Special Requests   Final    BOTTLES DRAWN AEROBIC AND ANAEROBIC Blood Culture adequate volume    Culture   Final    NO GROWTH 3 DAYS Performed at [facility], [address] [place], [place] [address]    Report Status PENDING  Incomplete  Resp Panel by RT PCR (RSV, Flu A&B, Covid) - Nasopharyngeal Swab     Status: None   Collection Time: 02/16/20 11:56 PM   Specimen: Nasopharyngeal Swab  Result Value Ref Range Status   SARS Coronavirus 2 by RT PCR NEGATIVE NEGATIVE Final    Comment: (NOTE) SARS-CoV-2 target nucleic acids are NOT DETECTED.  The SARS-CoV-2 RNA is generally detectable in upper respiratoy specimens during the acute phase of infection. The lowest concentration of SARS-CoV-2 viral copies this assay can detect is 131 copies/mL. A negative result does not preclude SARS-Cov-2 infection and should not be used as the sole basis for treatment or other patient management decisions. A negative result may occur with  improper specimen collection/handling, submission of specimen other than nasopharyngeal  swab, presence of viral mutation(s) within the areas targeted by this assay, and inadequate number of viral copies (<131 copies/mL). A negative result must be combined with clinical observations, patient history, and epidemiological information. The expected result is Negative.  Fact Sheet for Patients:  https://www.moore.com/  Fact Sheet for Healthcare Providers:  https://www.young.biz/  This test is no t yet approved or cleared by the [place] FDA and  has been authorized for detection and/or diagnosis of SARS-CoV-2 by FDA under an Emergency Use Authorization (EUA). This EUA will remain  in effect (meaning this test can be used) for the duration of the COVID-19 declaration under Section 564(b)(1) of the Act, 21 U.S.C. section 360bbb-3(b)(1), unless the authorization is terminated or revoked sooner.     Influenza A by PCR NEGATIVE NEGATIVE Final   Influenza B by PCR NEGATIVE NEGATIVE Final    Comment: (NOTE) The Xpert  Xpress SARS-CoV-2/FLU/RSV assay is intended as an aid in  the diagnosis of influenza from Nasopharyngeal swab specimens and  should not be used as a sole basis for treatment. Nasal washings and  aspirates are unacceptable for Xpert Xpress SARS-CoV-2/FLU/RSV  testing.  Fact Sheet for Patients: https://www.moore.com/  Fact Sheet for Healthcare Providers: https://www.young.biz/  This test is not yet approved or cleared by the [place] FDA and  has been authorized for detection and/or diagnosis of SARS-CoV-2 by  FDA under an Emergency Use Authorization (EUA). This EUA will remain  in effect (meaning this test can be used) for the duration of the  Covid-19 declaration under Section 564(b)(1) of the Act, 21  U.S.C. section 360bbb-3(b)(1), unless the authorization is  terminated or revoked.    Respiratory Syncytial Virus by PCR NEGATIVE NEGATIVE Final    Comment: (NOTE) Fact Sheet for Patients: https://www.moore.com/  Fact Sheet for Healthcare Providers: https://www.young.biz/  This test is not yet approved or cleared by the [place] FDA and  has been authorized for detection and/or diagnosis of SARS-CoV-2 by  FDA under an Emergency Use Authorization (EUA). This EUA will remain  in effect (meaning this test can be used) for the duration of the  COVID-19 declaration under Section 564(b)(1) of the Act, 21 U.S.C.  section 360bbb-3(b)(1), unless the authorization is terminated or  revoked. Performed at [facility], [address] [place], [place] [address]   Culture, blood (routine x 2)     Status: None (Preliminary result)   Collection Time: 02/18/20  5:48 AM   Specimen: BLOOD  Result Value Ref Range Status   Specimen Description   Final    BLOOD Performed at [facility], [address], [place], [place] [address]    Special Requests   Final    LEFT ANTECUBITAL Performed at [facility], [address], [place], [place] [address]    Culture  Setup Time   Final    GRAM POSITIVE COCCI IN CLUSTERS ANAEROBIC BOTTLE ONLY Organism ID to follow CRITICAL RESULT CALLED TO, READ BACK BY AND VERIFIED WITH: G. Coffee PharmD 11:00 02/19/20 ([email or web address]) Performed at [facility], [address], [place], [place] [address]    Culture   Final    NO GROWTH < 24 HOURS Performed at [facility], [address] [place], [place] [address]    Report Status PENDING  Incomplete  Blood Culture ID Panel (Reflexed)     Status: None   Collection Time: 02/18/20  5:48 AM  Result Value Ref Range Status   Enterococcus faecalis NOT DETECTED NOT  DETECTED Final   Enterococcus Faecium NOT DETECTED NOT DETECTED Final   Listeria monocytogenes NOT DETECTED NOT DETECTED Final   Staphylococcus species NOT DETECTED NOT DETECTED Final   Staphylococcus aureus (BCID) NOT DETECTED NOT DETECTED Final   Staphylococcus epidermidis NOT DETECTED NOT DETECTED Final   Staphylococcus lugdunensis NOT DETECTED NOT DETECTED Final   Streptococcus species NOT DETECTED NOT DETECTED Final   Streptococcus agalactiae NOT DETECTED NOT DETECTED Final   Streptococcus pneumoniae NOT DETECTED NOT DETECTED Final   Streptococcus pyogenes NOT DETECTED NOT DETECTED Final   A.calcoaceticus-baumannii NOT DETECTED NOT DETECTED Final   Bacteroides fragilis NOT DETECTED NOT DETECTED Final   Enterobacterales NOT DETECTED NOT DETECTED Final   Enterobacter cloacae complex NOT DETECTED NOT DETECTED Final   Escherichia coli NOT DETECTED NOT DETECTED Final   Klebsiella aerogenes NOT DETECTED NOT DETECTED Final   Klebsiella oxytoca NOT DETECTED NOT DETECTED Final   Klebsiella pneumoniae NOT DETECTED NOT DETECTED Final   Proteus species NOT DETECTED NOT DETECTED Final   Salmonella species NOT DETECTED NOT DETECTED Final   Serratia marcescens NOT DETECTED NOT DETECTED Final   Haemophilus influenzae NOT DETECTED NOT DETECTED Final   Neisseria  meningitidis NOT DETECTED NOT DETECTED Final   Pseudomonas aeruginosa NOT DETECTED NOT DETECTED Final   Stenotrophomonas maltophilia NOT DETECTED NOT DETECTED Final   Candida albicans NOT DETECTED NOT DETECTED Final   Candida auris NOT DETECTED NOT DETECTED Final   Candida glabrata NOT DETECTED NOT DETECTED Final   Candida krusei NOT DETECTED NOT DETECTED Final   Candida parapsilosis NOT DETECTED NOT DETECTED Final   Candida tropicalis NOT DETECTED NOT DETECTED Final   Cryptococcus neoformans/gattii NOT DETECTED NOT DETECTED Final    Comment: Performed at [facility], [address], [place], [place] [address]  Culture, blood (routine x 2)     Status: None (Preliminary result)   Collection Time: 02/18/20  5:56 AM   Specimen: BLOOD  Result Value Ref Range Status   Specimen Description BLOOD  Final   Special Requests NONE  Final   Culture   Final    NO GROWTH < 24 HOURS Performed at [facility], [address], [place], [place] [address]    Report Status PENDING  Incomplete     Scheduled Meds: . aspirin EC  81 mg Oral Daily  . atorvastatin  10 mg Oral QHS  . chlorthalidone  12.5 mg Oral Daily  . diltiazem  240 mg Oral Daily  . famotidine  40 mg Oral QPC supper  . feeding supplement  237 mL Oral BID BM  . gabapentin  300 mg Oral TID  . heparin  5,000 Units Subcutaneous Q8H  . influenza vaccine adjuvanted  0.5 mL Intramuscular Tomorrow-1000  . insulin aspart  0-9 Units Subcutaneous TID WC  . irbesartan  150 mg Oral Daily  . isosorbide mononitrate  30 mg Oral Daily  . mirtazapine  7.5 mg Oral QHS  . multivitamin with minerals  1 tablet Oral Daily  . predniSONE  5 mg Oral Q breakfast   Continuous Infusions: . sodium chloride 10 mL/hr at 02/17/20 1016  . azithromycin 500 mg (02/18/20 2155)  . cefTRIAXone (ROCEPHIN)  IV 1 g (02/18/20 2314)    Procedures/Studies: DG Chest 2 View  Result Date: 02/16/2020 CLINICAL DATA:  Shortness of breath EXAM: CHEST - 2 VIEW  COMPARISON:  08/08/2019 FINDINGS: Bibasilar airspace opacities. No effusions. Heart is normal size. No acute bony abnormality. IMPRESSION: Bilateral lower lobe airspace opacities,  atelectasis versus infiltrates/pneumonia. Electronically Signed   By: Charlett Nose M.D.   On: 02/16/2020 20:54    Catarina Hartshorn, DO  [facility]  If 7PM-7AM, please contact night-coverage [email or web address] Password [email or web address] 02/19/2020, 3:33 PM   LOS: 2 days

## 2020-02-19 NOTE — Progress Notes (Signed)
Pt has completed first bottle of oral contrast and is now beginning to drink second bottle. New IV started per radiology request in left FA, #20g saline lock.

## 2020-02-20 DIAGNOSIS — Z720 Tobacco use: Secondary | ICD-10-CM | POA: Diagnosis not present

## 2020-02-20 DIAGNOSIS — E43 Unspecified severe protein-calorie malnutrition: Secondary | ICD-10-CM | POA: Diagnosis not present

## 2020-02-20 DIAGNOSIS — J189 Pneumonia, unspecified organism: Secondary | ICD-10-CM | POA: Diagnosis not present

## 2020-02-20 DIAGNOSIS — I10 Essential (primary) hypertension: Secondary | ICD-10-CM | POA: Diagnosis not present

## 2020-02-20 LAB — COMPREHENSIVE METABOLIC PANEL
ALT: 17 U/L (ref 0–44)
AST: 15 U/L (ref 15–41)
Albumin: 3.6 g/dL (ref 3.5–5.0)
Alkaline Phosphatase: 60 U/L (ref 38–126)
Anion gap: 14 (ref 5–15)
BUN: 15 mg/dL (ref 8–23)
CO2: 32 mmol/L (ref 22–32)
Calcium: 9.9 mg/dL (ref 8.9–10.3)
Chloride: 90 mmol/L — ABNORMAL LOW (ref 98–111)
Creatinine, Ser: 0.7 mg/dL (ref 0.61–1.24)
GFR, Estimated: >60 mL/min (ref >60)
Glucose, Bld: 93 mg/dL (ref 70–99)
Potassium: 3.4 mmol/L — ABNORMAL LOW (ref 3.5–5.1)
Sodium: 136 mmol/L (ref 135–145)
Total Bilirubin: 1.2 mg/dL (ref 0.3–1.2)
Total Protein: 6.9 g/dL (ref 6.5–8.1)

## 2020-02-20 LAB — MAGNESIUM: Magnesium: 2.1 mg/dL (ref 1.7–2.4)

## 2020-02-20 LAB — CBC
HCT: 32.8 % — ABNORMAL LOW (ref 39.0–52.0)
Hemoglobin: 10.2 g/dL — ABNORMAL LOW (ref 13.0–17.0)
MCH: 27.7 pg (ref 26.0–34.0)
MCHC: 31.1 g/dL (ref 30.0–36.0)
MCV: 89.1 fL (ref 80.0–100.0)
Platelets: 327 10*3/uL (ref 150–400)
RBC: 3.68 MIL/uL — ABNORMAL LOW (ref 4.22–5.81)
RDW: 14.3 % (ref 11.5–15.5)
WBC: 10 10*3/uL (ref 4.0–10.5)
nRBC: 0 % (ref 0.0–0.2)

## 2020-02-20 MED ORDER — AZITHROMYCIN 500 MG PO TABS
500.0000 mg | ORAL_TABLET | Freq: Every day | ORAL | 0 refills | Status: DC
Start: 2020-02-20 — End: 2020-03-16

## 2020-02-20 MED ORDER — AZITHROMYCIN 250 MG PO TABS: 500.0000 mg | ORAL_TABLET | Freq: Every day | ORAL | Status: DC

## 2020-02-20 MED ORDER — CEFDINIR 300 MG PO CAPS
300.0000 mg | ORAL_CAPSULE | Freq: Two times a day (BID) | ORAL | 0 refills | Status: DC
Start: 2020-02-20 — End: 2020-03-16

## 2020-02-20 MED ORDER — CEFDINIR 300 MG PO CAPS: 300.0000 mg | ORAL_CAPSULE | Freq: Two times a day (BID) | ORAL | Status: DC

## 2020-02-20 NOTE — Progress Notes (Signed)
Pt discharged via WC to POV by unit staff member with home O2 tank at 2lmp [place] and all personal belongings in his possession.

## 2020-02-20 NOTE — Discharge Summary (Signed)
Physician Discharge Summary  Jeffery [name] ZOX:[id number] DOB: 1950-07-23 DOA: 02/16/2020  PCP: Toma Deiters, MD  Admit date: 02/16/2020 Discharge date: 02/20/2020  Admitted From: Home Disposition:  Home   Recommendations for Outpatient Follow-up:  1. Follow up with PCP in 1-2 weeks 2. Please obtain BMP/CBC in one week   Home Health: No Equipment/Devices: 2L  Discharge Condition: Stable CODE STATUS: FULL Diet recommendation: Heart Healthy   Brief/Interim Summary: 69 year old male with a history of hypertension, hyperlipidemia, COPD, diet-controlled diabetes mellitus type 2, chronic with hypoxia on 2 L, tobacco abuse presenting with 1 day history of shortness of breath and wheezing with associated nonproductive cough.  He also complained of some myalgias and decreased oral intake.  He had denied any chest pain, palpitations, nausea, vomiting, diarrhea, abdominal pain, dysuria. In the emergency department, the patient had low-grade temperature of 99.3 F.  He was hemodynamically stable with heart rate of 110.  WBC was 24.7.  He was 97% on 2 L.  Chest x-ray showed bibasilar lower lobe opacities.  The patient was started on ceftriaxone and azithromycin.  Discharge Diagnoses:  Sepsis -Present on admission -Presented with tachycardia and leukocytosis -Secondary to pneumonia -he is COVID vaccinated -sepsis physiology resolved  Lobar pneumonia -Continue ceftriaxone and azithromycin -Personally reviewed chest x-ray--bibasilar opacities -d/c home with cefdinir and azithro x 4 more days  Bacteremia -one of four sets--likely contaminant  Essential hypertension -Continue diltiazem and chlorthalidone -Continue ARB  Diabetes mellitus type 2 -Holding metformin -02/17/2020 hemoglobin A1c 5.8  COPD -30 pack year hx -stopped smoking 5 yrs ago  Chronic respiratory failure with hypoxia -stable on 2L at home -on 2L currently  Hyperlipidemia -continue  statin  Abdominal pain -CT abd/pelvis--neg for acute findings;  Mod-to-large amount of stool -tolerating diet -likely related to constipation -miralax daily  Discharge Instructions   Allergies as of 02/20/2020      Reactions   Codeine Nausea Only      Medication List    TAKE these medications   albuterol 108 (90 Base) MCG/ACT inhaler Commonly known as: ProAir HFA Inhale 1-2 puffs into the lungs every 4 (four) hours as needed. What changed: reasons to take this   ALPRAZolam 0.5 MG tablet Commonly known as: XANAX Take 0.5 mg by mouth at bedtime.   aspirin EC 81 MG tablet Take 81 mg by mouth daily.   azithromycin 500 MG tablet Commonly known as: ZITHROMAX Take 1 tablet (500 mg total) by mouth daily.   cefdinir 300 MG capsule Commonly known as: OMNICEF Take 1 capsule (300 mg total) by mouth every 12 (twelve) hours.   chlorthalidone 25 MG tablet Commonly known as: HYGROTON Take 12.5 mg by mouth daily.   famotidine 40 MG tablet Commonly known as: PEPCID 1 daily after supper What changed:   how much to take  how to take this  when to take this  additional instructions   furosemide 20 MG tablet Commonly known as: LASIX Take 10 mg by mouth daily.   gabapentin 300 MG capsule Commonly known as: NEURONTIN Take 300 mg by mouth 3 (three) times daily.   isosorbide mononitrate 30 MG 24 hr tablet Commonly known as: IMDUR Take 30 mg by mouth daily.   Matzim LA 240 MG 24 hr tablet Generic drug: diltiazem Take 240 mg by mouth daily.   metFORMIN 500 MG tablet Commonly known as: GLUCOPHAGE Take 500 mg by mouth daily.   mirtazapine 15 MG tablet Commonly known as: REMERON Take 7.5 mg by mouth  at bedtime.   multivitamin with minerals Tabs tablet Take 1 tablet by mouth daily.   omeprazole 20 MG capsule Commonly known as: PRILOSEC Take 20 mg by mouth daily.   OXYGEN 2lpm 24/7   potassium chloride SA 20 MEQ tablet Commonly known as: KLOR-CON Take 20  mEq by mouth daily.   predniSONE 5 MG tablet Commonly known as: DELTASONE Prednisone is 20 mg per day  Until  (5mg  x 4 pills) until better then taper to 5 mg  - if worse go back up What changed:   how much to take  how to take this  when to take this  additional instructions   simvastatin 20 MG tablet Commonly known as: ZOCOR Take 20 mg by mouth at bedtime.   sucralfate 1 g tablet Commonly known as: CARAFATE Take 1 g by mouth 2 (two) times daily.   telmisartan 40 MG tablet Commonly known as: MICARDIS TAKE 1 TABLET BY MOUTH ONCE A DAY.   Trelegy Ellipta 100-62.5-25 MCG/INH Aepb Generic drug: Fluticasone-Umeclidin-Vilant One click each am   UNABLE TO FIND Med Name: buffered salt supplement twice daily       Allergies  Allergen Reactions  . Codeine Nausea Only    Consultations:  none   Procedures/Studies: DG Chest 2 View  Result Date: 02/16/2020 CLINICAL DATA:  Shortness of breath EXAM: CHEST - 2 VIEW COMPARISON:  08/08/2019 FINDINGS: Bibasilar airspace opacities. No effusions. Heart is normal size. No acute bony abnormality. IMPRESSION: Bilateral lower lobe airspace opacities, atelectasis versus infiltrates/pneumonia. Electronically Signed   By: Charlett NoseKevin  Dover M.D.   On: 02/16/2020 20:54   CT ABDOMEN PELVIS W CONTRAST  Result Date: 02/19/2020 CLINICAL DATA:  Abdominal pain with fever. EXAM: CT ABDOMEN AND PELVIS WITH CONTRAST TECHNIQUE: Multidetector CT imaging of the abdomen and pelvis was performed using the standard protocol following bolus administration of intravenous contrast. CONTRAST:  80mL OMNIPAQUE IOHEXOL 300 MG/ML  SOLN COMPARISON:  Chest radiographs 02/16/2020. Abdominal radiographs 12/10/2018. Abdominal CT 11/22/2013 unavailable. FINDINGS: Lower chest: Emphysematous changes at both lung bases with partially visualized airspace disease in the right middle lobe. No significant pleural or pericardial effusion. Mild aortic atherosclerosis. Hepatobiliary:  The liver is normal in density without suspicious focal abnormality. No evidence of gallstones, gallbladder wall thickening or biliary dilatation. Pancreas: Unremarkable. No pancreatic ductal dilatation or surrounding inflammatory changes. Spleen: Normal in size without focal abnormality. Adrenals/Urinary Tract: Both adrenal glands appear normal. The kidneys appear normal without evidence of urinary tract calculus, suspicious lesion or hydronephrosis. No bladder abnormalities are seen. Stomach/Bowel: No evidence of bowel wall thickening, distention or surrounding inflammatory change. The appendix is not convincingly demonstrated, but there is no evidence of pericecal inflammation. There is a moderate to large amount of stool throughout the colon. Vascular/Lymphatic: There are no enlarged abdominal or pelvic lymph nodes. Aortic and branch vessel atherosclerosis without aneurysm or acute vascular findings. The portal, superior mesenteric and splenic veins are patent. Reproductive: Mild enlargement of the prostate gland. Other: Paucity of intra-abdominal fat. Multiple right-sided pelvic phleboliths. Mild bowel protrusion in both inguinal regions without discrete hernia. Musculoskeletal: No acute or significant osseous findings. There are multiple superior endplate compression deformities/Schmorl's nodes in the lumbar spine which do not appear acute, most prominent at L2, L4 and L5. IMPRESSION: 1. No acute abdominal findings or explanation for the patient's symptoms. 2. Partially visualized right middle lobe airspace disease, suspicious for pneumonia. Radiographic follow up recommended. 3. Moderate to large amount of stool throughout the colon suggesting constipation.  4. Multiple chronic appearing Schmorl's nodes in the lumbar spine. 5. Aortic Atherosclerosis (ICD10-I70.0) and Emphysema (ICD10-J43.9). Electronically Signed   By: Carey Bullocks M.D.   On: 02/19/2020 20:34         Discharge Exam: Vitals:    02/19/20 2117 02/20/20 0446  BP: 126/77 130/70  Pulse: 77 78  Resp: 16 20  Temp: 99 F (37.2 C) 98.2 F (36.8 C)  SpO2: 100% 99%   Vitals:   02/19/20 0952 02/19/20 1351 02/19/20 2117 02/20/20 0446  BP: 139/83 115/74 126/77 130/70  Pulse: 86 92 77 78  Resp: 20  16 20   Temp: 98.8 F (37.1 C) 98.7 F (37.1 C) 99 F (37.2 C) 98.2 F (36.8 C)  TempSrc: Oral Oral Oral Oral  SpO2: 98% 97% 100% 99%  Weight:      Height:        General: Pt is alert, awake, not in acute distress Cardiovascular: RRR, S1/S2 +, no rubs, no gallops Respiratory: diminished BS.  Bibasilar rales. No wheeze Abdominal: Soft, NT, ND, bowel sounds + Extremities: no edema, no cyanosis   The results of significant diagnostics from this hospitalization (including imaging, microbiology, ancillary and laboratory) are listed below for reference.    Significant Diagnostic Studies: DG Chest 2 View  Result Date: 02/16/2020 CLINICAL DATA:  Shortness of breath EXAM: CHEST - 2 VIEW COMPARISON:  08/08/2019 FINDINGS: Bibasilar airspace opacities. No effusions. Heart is normal size. No acute bony abnormality. IMPRESSION: Bilateral lower lobe airspace opacities, atelectasis versus infiltrates/pneumonia. Electronically Signed   By: 10/08/2019 M.D.   On: 02/16/2020 20:54   CT ABDOMEN PELVIS W CONTRAST  Result Date: 02/19/2020 CLINICAL DATA:  Abdominal pain with fever. EXAM: CT ABDOMEN AND PELVIS WITH CONTRAST TECHNIQUE: Multidetector CT imaging of the abdomen and pelvis was performed using the standard protocol following bolus administration of intravenous contrast. CONTRAST:  29mL OMNIPAQUE IOHEXOL 300 MG/ML  SOLN COMPARISON:  Chest radiographs 02/16/2020. Abdominal radiographs 12/10/2018. Abdominal CT 11/22/2013 unavailable. FINDINGS: Lower chest: Emphysematous changes at both lung bases with partially visualized airspace disease in the right middle lobe. No significant pleural or pericardial effusion. Mild aortic  atherosclerosis. Hepatobiliary: The liver is normal in density without suspicious focal abnormality. No evidence of gallstones, gallbladder wall thickening or biliary dilatation. Pancreas: Unremarkable. No pancreatic ductal dilatation or surrounding inflammatory changes. Spleen: Normal in size without focal abnormality. Adrenals/Urinary Tract: Both adrenal glands appear normal. The kidneys appear normal without evidence of urinary tract calculus, suspicious lesion or hydronephrosis. No bladder abnormalities are seen. Stomach/Bowel: No evidence of bowel wall thickening, distention or surrounding inflammatory change. The appendix is not convincingly demonstrated, but there is no evidence of pericecal inflammation. There is a moderate to large amount of stool throughout the colon. Vascular/Lymphatic: There are no enlarged abdominal or pelvic lymph nodes. Aortic and branch vessel atherosclerosis without aneurysm or acute vascular findings. The portal, superior mesenteric and splenic veins are patent. Reproductive: Mild enlargement of the prostate gland. Other: Paucity of intra-abdominal fat. Multiple right-sided pelvic phleboliths. Mild bowel protrusion in both inguinal regions without discrete hernia. Musculoskeletal: No acute or significant osseous findings. There are multiple superior endplate compression deformities/Schmorl's nodes in the lumbar spine which do not appear acute, most prominent at L2, L4 and L5. IMPRESSION: 1. No acute abdominal findings or explanation for the patient's symptoms. 2. Partially visualized right middle lobe airspace disease, suspicious for pneumonia. Radiographic follow up recommended. 3. Moderate to large amount of stool throughout the colon suggesting constipation. 4. Multiple  chronic appearing Schmorl's nodes in the lumbar spine. 5. Aortic Atherosclerosis (ICD10-I70.0) and Emphysema (ICD10-J43.9). Electronically Signed   By: Carey Bullocks M.D.   On: 02/19/2020 20:34      Microbiology: Recent Results (from the past 240 hour(s))  Blood culture (routine x 2)     Status: None (Preliminary result)   Collection Time: 02/16/20 11:27 PM   Specimen: BLOOD RIGHT ARM  Result Value Ref Range Status   Specimen Description BLOOD RIGHT ARM  Final   Special Requests   Final    BOTTLES DRAWN AEROBIC AND ANAEROBIC Blood Culture adequate volume   Culture   Final    NO GROWTH 4 DAYS Performed at [facility], [address] [place], [place] [address]    Report Status PENDING  Incomplete  Blood culture (routine x 2)     Status: None (Preliminary result)   Collection Time: 02/16/20 11:33 PM   Specimen: Left Antecubital; Blood  Result Value Ref Range Status   Specimen Description LEFT ANTECUBITAL  Final   Special Requests   Final    BOTTLES DRAWN AEROBIC AND ANAEROBIC Blood Culture adequate volume   Culture   Final    NO GROWTH 4 DAYS Performed at [facility], [address] [place], [place] [address]    Report Status PENDING  Incomplete  Resp Panel by RT PCR (RSV, Flu A&B, Covid) - Nasopharyngeal Swab     Status: None   Collection Time: 02/16/20 11:56 PM   Specimen: Nasopharyngeal Swab  Result Value Ref Range Status   SARS Coronavirus 2 by RT PCR NEGATIVE NEGATIVE Final    Comment: (NOTE) SARS-CoV-2 target nucleic acids are NOT DETECTED.  The SARS-CoV-2 RNA is generally detectable in upper respiratoy specimens during the acute phase of infection. The lowest concentration of SARS-CoV-2 viral copies this assay can detect is 131 copies/mL. A negative result does not preclude SARS-Cov-2 infection and should not be used as the sole basis for treatment or other patient management decisions. A negative result may occur with  improper specimen collection/handling, submission of specimen other than nasopharyngeal swab, presence of viral mutation(s) within the areas targeted by this assay, and inadequate number of viral copies (<131 copies/mL). A negative result  must be combined with clinical observations, patient history, and epidemiological information. The expected result is Negative.  Fact Sheet for Patients:  https://www.moore.com/  Fact Sheet for Healthcare Providers:  https://www.young.biz/  This test is no t yet approved or cleared by the [place] FDA and  has been authorized for detection and/or diagnosis of SARS-CoV-2 by FDA under an Emergency Use Authorization (EUA). This EUA will remain  in effect (meaning this test can be used) for the duration of the COVID-19 declaration under Section 564(b)(1) of the Act, 21 U.S.C. section 360bbb-3(b)(1), unless the authorization is terminated or revoked sooner.     Influenza A by PCR NEGATIVE NEGATIVE Final   Influenza B by PCR NEGATIVE NEGATIVE Final    Comment: (NOTE) The Xpert Xpress SARS-CoV-2/FLU/RSV assay is intended as an aid in  the diagnosis of influenza from Nasopharyngeal swab specimens and  should not be used as a sole basis for treatment. Nasal washings and  aspirates are unacceptable for Xpert Xpress SARS-CoV-2/FLU/RSV  testing.  Fact Sheet for Patients: https://www.moore.com/  Fact Sheet for Healthcare Providers: https://www.young.biz/  This test is not yet approved or cleared by the [place] FDA and  has been authorized for detection and/or diagnosis of SARS-CoV-2 by  FDA under an Emergency Use  Authorization (EUA). This EUA will remain  in effect (meaning this test can be used) for the duration of the  Covid-19 declaration under Section 564(b)(1) of the Act, 21  U.S.C. section 360bbb-3(b)(1), unless the authorization is  terminated or revoked.    Respiratory Syncytial Virus by PCR NEGATIVE NEGATIVE Final    Comment: (NOTE) Fact Sheet for Patients: https://www.moore.com/  Fact Sheet for Healthcare Providers: https://www.young.biz/  This  test is not yet approved or cleared by the [place] FDA and  has been authorized for detection and/or diagnosis of SARS-CoV-2 by  FDA under an Emergency Use Authorization (EUA). This EUA will remain  in effect (meaning this test can be used) for the duration of the  COVID-19 declaration under Section 564(b)(1) of the Act, 21 U.S.C.  section 360bbb-3(b)(1), unless the authorization is terminated or  revoked. Performed at [facility], [address], [place], [place] [address]   Culture, blood (routine x 2)     Status: None (Preliminary result)   Collection Time: 02/18/20  5:48 AM   Specimen: BLOOD  Result Value Ref Range Status   Specimen Description   Final    BLOOD Performed at [facility], [address] [place], [place] [address]    Special Requests   Final    LEFT ANTECUBITAL Performed at [facility], [address], [place], [place] [address]    Culture  Setup Time   Final    GRAM POSITIVE COCCI IN CLUSTERS ANAEROBIC BOTTLE ONLY Organism ID to follow CRITICAL RESULT CALLED TO, READ BACK BY AND VERIFIED WITH: G. Coffee PharmD 11:00 02/19/20 ([email or web address]) Performed at [facility], [address], [place], [place] [address]    Culture   Final    NO GROWTH 2 DAYS Performed at [facility], [address], [place], [place] [address]    Report Status PENDING  Incomplete  Blood Culture ID Panel (Reflexed)     Status: None   Collection Time: 02/18/20  5:48 AM  Result Value Ref Range Status   Enterococcus faecalis NOT DETECTED NOT DETECTED Final   Enterococcus Faecium NOT DETECTED NOT DETECTED Final   Listeria monocytogenes NOT DETECTED NOT DETECTED Final   Staphylococcus species NOT DETECTED NOT DETECTED Final   Staphylococcus aureus (BCID) NOT DETECTED NOT DETECTED Final   Staphylococcus epidermidis NOT DETECTED NOT DETECTED Final   Staphylococcus lugdunensis NOT DETECTED NOT DETECTED Final   Streptococcus species NOT DETECTED NOT DETECTED Final    Streptococcus agalactiae NOT DETECTED NOT DETECTED Final   Streptococcus pneumoniae NOT DETECTED NOT DETECTED Final   Streptococcus pyogenes NOT DETECTED NOT DETECTED Final   A.calcoaceticus-baumannii NOT DETECTED NOT DETECTED Final   Bacteroides fragilis NOT DETECTED NOT DETECTED Final   Enterobacterales NOT DETECTED NOT DETECTED Final   Enterobacter cloacae complex NOT DETECTED NOT DETECTED Final   Escherichia coli NOT DETECTED NOT DETECTED Final   Klebsiella aerogenes NOT DETECTED NOT DETECTED Final   Klebsiella oxytoca NOT DETECTED NOT DETECTED Final   Klebsiella pneumoniae NOT DETECTED NOT DETECTED Final   Proteus species NOT DETECTED NOT DETECTED Final   Salmonella species NOT DETECTED NOT DETECTED Final   Serratia marcescens NOT DETECTED NOT DETECTED Final   Haemophilus influenzae NOT DETECTED NOT DETECTED Final   Neisseria meningitidis NOT DETECTED NOT DETECTED Final   Pseudomonas aeruginosa NOT DETECTED NOT DETECTED Final   Stenotrophomonas maltophilia NOT DETECTED NOT DETECTED Final   Candida albicans NOT DETECTED NOT DETECTED Final   Candida auris NOT DETECTED  NOT DETECTED Final   Candida glabrata NOT DETECTED NOT DETECTED Final   Candida krusei NOT DETECTED NOT DETECTED Final   Candida parapsilosis NOT DETECTED NOT DETECTED Final   Candida tropicalis NOT DETECTED NOT DETECTED Final   Cryptococcus neoformans/gattii NOT DETECTED NOT DETECTED Final    Comment: Performed at [facility], [address] [place], [place] [address]  Culture, blood (routine x 2)     Status: None (Preliminary result)   Collection Time: 02/18/20  5:56 AM   Specimen: BLOOD  Result Value Ref Range Status   Specimen Description BLOOD  Final   Special Requests NONE  Final   Culture   Final    NO GROWTH 2 DAYS Performed at [facility], [address] [place], [place] [address]    Report Status PENDING  Incomplete     Labs: Basic Metabolic Panel: Recent Labs  Lab 02/16/20 1945  02/16/20 1945 02/17/20 0343 02/17/20 0343 02/19/20 0431 02/20/20 0554  NA 137  --  137  --  137 136  K 4.3   < > 4.1   < > 3.6 3.4*  CL 95*  --  95*  --  92* 90*  CO2 27  --  28  --  31 32  GLUCOSE 120*  --  93  --  99 93  BUN 19  --  19  --  15 15  CREATININE 0.86  --  0.87  --  0.85 0.70  CALCIUM 9.8  --  9.0  --  9.3 9.9  MG  --   --  2.1  --   --  2.1   < > = values in this interval not displayed.   Liver Function Tests: Recent Labs  Lab 02/17/20 0343 02/20/20 0554  AST 15 15  ALT 14 17  ALKPHOS 63 60  BILITOT 1.1 1.2  PROT 7.0 6.9  ALBUMIN 3.8 3.6   No results for input(s): LIPASE, AMYLASE in the last 168 hours. No results for input(s): AMMONIA in the last 168 hours. CBC: Recent Labs  Lab 02/16/20 1945 02/17/20 0343 02/19/20 0431 02/20/20 0554  WBC 24.7* 22.3* 11.2* 10.0  NEUTROABS 21.6* 19.5* 8.2*  --   HGB 13.0 11.5* 10.4* 10.2*  HCT 41.2 36.6* 33.6* 32.8*  MCV 89.4 91.3 90.6 89.1  PLT 304 294 322 327   Cardiac Enzymes: No results for input(s): CKTOTAL, CKMB, CKMBINDEX, TROPONINI in the last 168 hours. BNP: Invalid input(s): POCBNP CBG: Recent Labs  Lab 02/18/20 1147 02/18/20 1658 02/19/20 0739 02/19/20 1109 02/19/20 1619  GLUCAP 109* 186* 102* 177* 185*    Time coordinating discharge:  36 minutes  Signed:  Catarina Hartshorn, DO [facility] Pager: [phone] 02/20/2020, 10:02 AM

## 2020-02-21 LAB — CULTURE, BLOOD (ROUTINE X 2)
Culture: NO GROWTH
Culture: NO GROWTH
Special Requests: ADEQUATE
Special Requests: ADEQUATE

## 2020-02-23 LAB — CULTURE, BLOOD (ROUTINE X 2)
Culture: NO GROWTH
Culture: NO GROWTH

## 2020-03-03 DIAGNOSIS — J9611 Chronic respiratory failure with hypoxia: Secondary | ICD-10-CM | POA: Diagnosis not present

## 2020-03-03 DIAGNOSIS — J449 Chronic obstructive pulmonary disease, unspecified: Secondary | ICD-10-CM | POA: Diagnosis not present

## 2020-03-16 ENCOUNTER — Encounter: Payer: Self-pay | Admitting: Internal Medicine

## 2020-03-16 ENCOUNTER — Ambulatory Visit: Payer: Medicare Other | Admitting: Internal Medicine

## 2020-03-16 ENCOUNTER — Other Ambulatory Visit: Payer: Self-pay

## 2020-03-16 ENCOUNTER — Ambulatory Visit (HOSPITAL_COMMUNITY)
Admission: RE | Admit: 2020-03-16 | Discharge: 2020-03-16 | Disposition: A | Discharge status: Home / Self Care | Payer: Medicare Other | Source: Ambulatory Visit | Attending: Internal Medicine | Admitting: Internal Medicine

## 2020-03-16 DIAGNOSIS — J9611 Chronic respiratory failure with hypoxia: Secondary | ICD-10-CM

## 2020-03-16 DIAGNOSIS — J449 Chronic obstructive pulmonary disease, unspecified: Secondary | ICD-10-CM | POA: Insufficient documentation

## 2020-03-16 DIAGNOSIS — J439 Emphysema, unspecified: Secondary | ICD-10-CM | POA: Diagnosis not present

## 2020-03-16 MED ORDER — TRELEGY ELLIPTA 100-62.5-25 MCG/INH IN AEPB: 1.0000 | INHALATION_SPRAY | Freq: Every day | RESPIRATORY_TRACT | 0 refills | Status: DC

## 2020-03-16 NOTE — Progress Notes (Signed)
Jeffery [name], male    DOB: 09-02-1950,    MRN: [id number]   Brief patient profile:  32  yobm quit smoking 05/2013 with onset of doe around 2013 and at the point he quit smoking already on neb qid and then placed on variable inhalers and 02 but does not recall ever having  pfts  Seen in ER x 6 x in 2020 so referred to pulmonary clinic 10/03/2018 by Dr   Kreg Shropshire steroid dep x 2019.    History of Present Illness  10/03/2018  Pulmonary/ 1st office eval/Captola Teschner  On ACEi/dpi's including spiriva  Chief Complaint  Patient presents with  . Consult    Consult WP:[id number]. States his breathing has its good and bad days. States he has been on oxygen for about 2 years and also uses at night.   Dyspnea:  Much better p ER rx x 6  But only temporarily while  On pred 40 mg then back to 5 mg and flares w/in a week  Cough: dry/choking sensation/ worse when use inhalers esp dpi's Sleep: prone / bed is flat SABA use: multiple and not helpful  02 2lpm hs  rec Take omeprazole 20 mg x 2  About 30-60 before your first meal Take famotidine 40 mg after supper or before bed  Stop all inhalers and just use the nebulizer = duoneb ipratropium /albuterol breafast lunch supper and bedtime automatically. Only use your albuterol nebulizer as a rescue medication  Stop benazapril and start  micardis (telmisartan) 40 mg one daily in its place and call me if there is a problem  Prednisone 20 mg this evening,  Then 20 mg x 2 days, 10 mg x 2 days then stay on 5 mg daily but if get worse go back to 20 mg each am and start over   10/31/2018  f/u ov/Kerby Hockley re:  Copd/ ? acei contributing to some of his symptoms?  Chief Complaint  Patient presents with  . Follow-up    Breathing is doing well today. He states his throat feels dry.    Dyspnea:  Walked to stop sign and back s stopping x two = 250 ft? Cough: better / no longer choking but throat still doesn't feel quite right  Sleeping: ok p xanax  SABA use: avg 2-3 daily  02:  2lpm NP  24/7  rec Reduce your telmisartan to 40 mg one half a pill daily  Duoneb=  ipratropium /albuterol breafast lunch supper and bedtime automatically. As a backup >>> Only use your albuterol nebulizer as a rescue medication to be used if you can't catch your breath by resting or doing a relaxed purse lip breathing pattern.  - The less you use it, the better it will work when you need it. - Ok to use up to  every 3  hours if you must but call for immediate appointment if use goes up over your usual need or resume prednisone at 20 mg   Prednisone is 20 mg per day  Until  (5mg  x 4 pills) until better then taper to 5 mg  - if worse go back up  Please schedule a follow up office visit in 4 weeks, sooner if needed  with all medications /inhalers/ solutions in hand so we can verify exactly what you are taking. This includes all medications from all doctors and over the counters Add: consider adding budesonide to neb next ov if can't reduce      11/28/2018  f/u ov/Chrishun Scheer re: COPD /  f/u off acei improved overall - using duoneb qid as   maint rx plus saba hfa "when over does it" then makes him shaky   Chief Complaint  Patient presents with  . Follow-up    Breathing is "so so". He is c/o shakiness and feels like his legs give out too easily. He is using his albuterol inhaler 2 x daily and Duoneb 4 x daily.   Dyspnea:  Walks to stop sign and back on 2lpm Cough: no Sleeping: ok prone flat bed  SABA use: as above 02: 2l  Lpm24/7  rec No change rx     04/29/2019  f/u ov/Malania Gawthrop re: Severe copd/ 02 dep / easily confused re details of care  Chief Complaint  Patient presents with  . Follow-up    Breathing is unchanged.   Dyspnea:  No longer walking outside home, doe x room to room  Cough: minimally congested, white mucus esp in am Sleeping: ok flat one pillow SABA use: much less  02: 2lpm 24/7  rec Plan A = Automatic = Always=    budsonide / ipatropium-albuterol first thing in am in nebulizer and again  12 hours  Plan B = Backup (to supplement plan A, not to replace it) Only use your albuterol inhaler(PROAIR /red)  as a rescue medication   Plan C = Crisis (instead of Plan B but only if Plan B stops working) - only use your ipatropium/albuterol nebulizer if you first try Plan B and it fails to help > ok to use the nebulizer up to every 4 hours but if start needing it regularly call for immediate appointment Plan D = Deltasone = Prednisone  Prednisone is 20 mg per day  Until  (5mg  x 4 pills) until better then taper to 5 mg  - if worse go back up      10/18/2019  f/u ov/Hunter Pinkard re: severe copd /taking salt supplements with newly reported leg swelling/ had both shots  Chief Complaint  Patient presents with  . Follow-up    Breathing is unchanged since the last visit. He is using his albuterol inhaler 2-3 x per day and neb with albuterol 2-3 x per day.   Dyspnea: room to room  Cough: none  Sleeping: one pillow bed is flat  SABA use: as above using neb only and very easily confused with details of care.  02: 2lpm 24/ 7  rec Plan A = Automatic = Always=    Trelegy 100 one click each am when feet hit the floor - take two good drags then brush teeth and tongue with arm and hammer toothpaste Stop the budesonide  Plan B = Backup (to supplement plan A, not to replace it) Only use your albuterol (proair)  inhaler as a rescue medication Plan C = Crisis (instead of Plan B but only if Plan B stops working) - only use your albuterol nebulizer if you first try Plan B and it fails to help > ok to use the nebulizer up to every 4 hours but if start needing it regularly call for immediate appointment Plan D = Deltasone 5 mg  4 daily until better then wean to one  Please schedule a follow up office visit in 6 weeks, sooner if needed      11/25/2019  f/u ov/[facility] office/Aqil Goetting re:  GOLD ? / 02 dep trelegy / pred dep/02 dep  Chief Complaint  Patient presents with  . Follow-up    Breathing is overall doing  well. He has  not used his rescue inhaler or neb.   Dyspnea:  Walking neighborhood now up to 15 min Cough: rarely Sleeping: bed is flat one pillow SABA use: none  02: 2lpm 24/7  rec  no change rx/ check 02 sats  03/16/2020  f/u ov/Baylen Buckner re: GOLD ?  maint on trelegy  Chief Complaint  Patient presents with  . Follow-up    shortness of breath with activity  Dyspnea:  Walking neighborhood 10-15 min on 3lpm POC  Cough: none  Sleeping: bed is flat/ one pillow  SABA use: rarely but has proair and neb 02: 2lpm 2/4  And 3lpm walking  but not cheicking sats    No obvious day to day or daytime variability or assoc excess/ purulent sputum or mucus plugs or hemoptysis or cp or chest tightness, subjective wheeze or overt sinus or hb symptoms.   Sleeping as above without nocturnal  or early am exacerbation  of respiratory  c/o's or need for noct saba. Also denies any obvious fluctuation of symptoms with weather or environmental changes or other aggravating or alleviating factors except as outlined above   No unusual exposure hx or h/o childhood pna/ asthma or knowledge of premature birth.  Current Allergies, Complete Past Medical History, Past Surgical History, Family History, and Social History were reviewed in [employer] record.  ROS  The following are not active complaints unless bolded Hoarseness, sore throat, dysphagia, dental problems, itching, sneezing,  nasal congestion or discharge of excess mucus or purulent secretions, ear ache,   fever, chills, sweats, unintended wt loss or wt gain, classically pleuritic or exertional cp,  orthopnea pnd or arm/hand swelling  or leg swelling, presyncope, palpitations, abdominal pain, anorexia, nausea, vomiting, diarrhea  or change in bowel habits or change in bladder habits, change in stools or change in urine, dysuria, hematuria,  rash, arthralgias, visual complaints, headache, numbness, weakness or ataxia or problems with walking or  coordination,  change in mood or  memory.        Current Meds  Medication Sig  . albuterol (PROAIR HFA) 108 (90 Base) MCG/ACT inhaler Inhale 1-2 puffs into the lungs every 4 (four) hours as needed. (Patient taking differently: Inhale 1-2 puffs into the lungs every 4 (four) hours as needed for wheezing or shortness of breath. )  . ALPRAZolam (XANAX) 0.5 MG tablet Take 0.5 mg by mouth 2 (two) times daily. Takes 1/2 tab in the morning and 1 tablet at bedtime  . aspirin EC 81 MG tablet Take 81 mg by mouth daily.  . famotidine (PEPCID) 40 MG tablet 1 daily after supper (Patient taking differently: Take 40 mg by mouth 2 (two) times daily. )  . Fluticasone-Umeclidin-Vilant (TRELEGY ELLIPTA) 100-62.5-25 MCG/INH AEPB One click each am  . furosemide (LASIX) 20 MG tablet Take 10 mg by mouth daily.  Marland Kitchen gabapentin (NEURONTIN) 300 MG capsule Take 300 mg by mouth 3 (three) times daily.   . isosorbide mononitrate (IMDUR) 30 MG 24 hr tablet Take 30 mg by mouth daily.  Marland Kitchen MATZIM LA 240 MG 24 hr tablet Take 240 mg by mouth daily.  . metFORMIN (GLUCOPHAGE) 500 MG tablet Take 500 mg by mouth daily.  . mirtazapine (REMERON) 15 MG tablet Take 7.5 mg by mouth at bedtime.  . Multiple Vitamin (MULTIVITAMIN WITH MINERALS) TABS tablet Take 1 tablet by mouth daily.  Marland Kitchen omeprazole (PRILOSEC) 20 MG capsule Take 20 mg by mouth daily.  . OXYGEN 2lpm 24/7  . potassium chloride SA (K-DUR,KLOR-CON) 20  MEQ tablet Take 20 mEq by mouth daily.   . predniSONE (DELTASONE) 5 MG tablet Prednisone is 20 mg per day  Until  (5mg  x 4 pills) until better then taper to 5 mg  - if worse go back up (Patient taking differently: Take 5 mg by mouth daily with breakfast. Take 1 tablet (5 mg) by mouth scheduled each day, may increase to 20 mg if needed for respiratory issues)  . simvastatin (ZOCOR) 20 MG tablet Take 20 mg by mouth at bedtime.   . sucralfate (CARAFATE) 1 g tablet Take 1 g by mouth 2 (two) times daily.   telmisartan (MICARDIS) 40 MG  tablet TAKE 1 TABLET BY MOUTH ONCE A DAY. (Patient taking differently: Take 40 mg by mouth daily. )  . UNABLE TO FIND Med Name: buffered salt supplement twice daily                   Past Medical History:  Diagnosis Date  . Chest pain 04/2011   Normal echocardiogram  . COPD (chronic obstructive pulmonary disease) (HCC)   . Fasting hyperglycemia    Normal hemoglobin A1c of 6.0  . GERD (gastroesophageal reflux disease)   . Hyperlipemia   . Hypertension   . On home O2    2L N/C  . On home O2    at night  . Prediabetes 06/04/2015  . Tobacco abuse          Objective:      03/16/2020       115  11/25/2019        113 10/18/2019        109 04/29/2019      110  11/28/2018        116   10/31/18 112 lb (50.8 kg)  10/03/18 114 lb 6.4 oz (51.9 kg)  09/30/18 116 lb (52.6 kg)     Vital signs reviewed  03/16/2020  - Note at rest 02 sats  99% on 2lpm POC    pleasant amb elderly bm nad  HEENT : pt wearing mask not removed for exam due to covid -19 concerns.    NECK :  without JVD/Nodes/TM/ nl carotid upstrokes bilaterally   LUNGS: no acc muscle use,  Mod barrel  contour chest wall with bilateral  Distant bs s audible wheeze and  without cough on insp or exp maneuvers and mod  Hyperresonant  to  percussion bilaterally     CV:  RRR  no s3 or murmur or increase in P2, and no edema   ABD:  soft and nontender with pos mid insp Hoover's  in the supine position. No bruits or organomegaly appreciated, bowel sounds nl  MS:     ext warm without deformities, calf tenderness, cyanosis or clubbing No obvious joint restrictions   SKIN: warm and dry without lesions    NEURO:  alert, approp, nl sensorium with  no motor or cerebellar deficits apparent.       CXR PA and Lateral:   03/16/2020 :    I personally reviewed images and agree with radiology impression as follows:    Persistent but improved bibasilar opacities. Recommend additional follow-up study. My impression: most of the  change is in the RML and linear in nature and has improved since prior studies esp on PA view c/w resolving inflammatory dz              Assessment

## 2020-03-16 NOTE — Patient Instructions (Addendum)
Plan A = Automatic = Always=    Trelegy one click each am  Plan B = Backup (to supplement plan A, not to replace it) Only use your albuterol inhaler as a rescue medication to be used if you can't catch your breath by resting or doing a relaxed purse lip breathing pattern.  - The less you use it, the better it will work when you need it. - Ok to use the inhaler up to 2 puffs  every 4 hours if you must but call for appointment if use goes up over your usual need - Don't leave home without it !!  (think of it like the spare tire for your car)   Plan C = Crisis (instead of Plan B but only if Plan B stops working) - only use your albuterol nebulizer if you first try Plan B and it fails to help > ok to use the nebulizer up to every 4 hours but if start needing it regularly call for immediate appointment   Plan D = Deltasone = prednisone  4 daily until better then wean to one half if feel great     Plan E = ER - go to ER or call 911 if all else fails    Make sure you check your oxygen saturations at highest level of activity to be sure it stays over 90% and adjust  02 flow upward to maintain this level if needed but remember to turn it back to previous settings when you stop (to conserve your supply).    Please remember to go to the  x-ray department  @  [facility] for your tests - we will call you with the results when they are available     Please schedule a follow up visit in 6   months but call sooner if needed PFTs same day

## 2020-03-17 ENCOUNTER — Encounter: Payer: Self-pay | Admitting: Internal Medicine

## 2020-03-17 NOTE — Progress Notes (Signed)
LMTCB

## 2020-03-17 NOTE — Assessment & Plan Note (Signed)
02 2lpm 24/7 as of 10/03/2018  -  10/18/2019   Walked RA  approx   300 ft  @ moderate to slow   pace  stopped due to  Sob  With sats still 92%     Again advised: Make sure you check your oxygen saturations at highest level of activity to be sure it stays over 90% and adjust  02 flow upward to maintain this level if needed but remember to turn it back to previous settings when you stop (to conserve your supply).           Each maintenance medication was reviewed in detail including emphasizing most importantly the difference between maintenance and prns and under what circumstances the prns are to be triggered using an action plan format where appropriate.  Total time for H and P, chart review, counseling, teaching device and generating customized AVS unique to this office visit / charting =  20 min

## 2020-03-17 NOTE — Assessment & Plan Note (Signed)
Quit smoking  05/2013 and steroid dep since 2019  - 10/03/2018  After extensive coaching inhaler device,  effectiveness =    0% (can't use them) - d/c all inhalers x for neb 10/03/2018 and d/c ACEi as well  - 10/18/2019  After extensive coaching inhaler device,  effectiveness =    75% with elipta > try trelegy one click each am > improved 11/25/2019  - 03/16/2020  After extensive coaching inhaler device,  effectiveness =    75% with spacer    Group D in terms of symptom/risk and laba/lama/ICS  therefore appropriate rx at this point >>>  Continue trelegy/ minimal prednisone (try 2.5 mg daily) and approp saba  I spent extra time with pt today reviewing appropriate use of albuterol for prn use on exertion with the following points: 1) saba is for relief of sob that does not improve by walking a slower pace or resting but rather if the pt does not improve after trying this first. 2) If the pt is convinced, as many are, that saba helps recover from activity faster then it's easy to tell if this is the case by re-challenging : ie stop, take the inhaler, then p 5 minutes try the exact same activity (intensity of workload) that just caused the symptoms and see if they are substantially diminished or not after saba 3) if there is an activity that reproducibly causes the symptoms, try the saba 15 min before the activity on alternate days   If in fact the saba really does help, then fine to continue to use it prn but advised may need to look closer at the maintenance regimen being used to achieve better control of airways disease with exertion.   Return in 6 m with pfts

## 2020-04-02 DIAGNOSIS — J449 Chronic obstructive pulmonary disease, unspecified: Secondary | ICD-10-CM | POA: Diagnosis not present

## 2020-04-02 DIAGNOSIS — J9611 Chronic respiratory failure with hypoxia: Secondary | ICD-10-CM | POA: Diagnosis not present

## 2020-04-02 NOTE — Progress Notes (Signed)
Left detailed msg on machine with results ok per DPR

## 2020-04-06 DIAGNOSIS — J441 Chronic obstructive pulmonary disease with (acute) exacerbation: Secondary | ICD-10-CM | POA: Diagnosis not present

## 2020-04-06 DIAGNOSIS — Z681 Body mass index (BMI) 19 or less, adult: Secondary | ICD-10-CM | POA: Diagnosis not present

## 2020-04-06 DIAGNOSIS — K5900 Constipation, unspecified: Secondary | ICD-10-CM | POA: Diagnosis not present

## 2020-04-29 DIAGNOSIS — K5900 Constipation, unspecified: Secondary | ICD-10-CM | POA: Diagnosis not present

## 2020-04-29 DIAGNOSIS — Z681 Body mass index (BMI) 19 or less, adult: Secondary | ICD-10-CM | POA: Diagnosis not present

## 2020-04-29 DIAGNOSIS — J441 Chronic obstructive pulmonary disease with (acute) exacerbation: Secondary | ICD-10-CM | POA: Diagnosis not present

## 2020-05-03 DIAGNOSIS — J449 Chronic obstructive pulmonary disease, unspecified: Secondary | ICD-10-CM | POA: Diagnosis not present

## 2020-05-03 DIAGNOSIS — J9611 Chronic respiratory failure with hypoxia: Secondary | ICD-10-CM | POA: Diagnosis not present

## 2020-05-20 ENCOUNTER — Encounter (HOSPITAL_COMMUNITY): Payer: Self-pay

## 2020-05-20 ENCOUNTER — Emergency Department (HOSPITAL_COMMUNITY): Payer: Medicare Other

## 2020-05-20 ENCOUNTER — Emergency Department (HOSPITAL_COMMUNITY)
Admission: EM | Admit: 2020-05-20 | Discharge: 2020-05-20 | Disposition: A | Discharge status: Home / Self Care | Payer: Medicare Other | Attending: Emergency Medicine | Admitting: Emergency Medicine

## 2020-05-20 ENCOUNTER — Other Ambulatory Visit: Payer: Self-pay

## 2020-05-20 DIAGNOSIS — Z87891 Personal history of nicotine dependence: Secondary | ICD-10-CM | POA: Diagnosis not present

## 2020-05-20 DIAGNOSIS — E1165 Type 2 diabetes mellitus with hyperglycemia: Secondary | ICD-10-CM | POA: Diagnosis not present

## 2020-05-20 DIAGNOSIS — Z20822 Contact with and (suspected) exposure to covid-19: Secondary | ICD-10-CM | POA: Diagnosis not present

## 2020-05-20 DIAGNOSIS — Z7982 Long term (current) use of aspirin: Secondary | ICD-10-CM | POA: Insufficient documentation

## 2020-05-20 DIAGNOSIS — Z79899 Other long term (current) drug therapy: Secondary | ICD-10-CM | POA: Diagnosis not present

## 2020-05-20 DIAGNOSIS — Z7984 Long term (current) use of oral hypoglycemic drugs: Secondary | ICD-10-CM | POA: Insufficient documentation

## 2020-05-20 DIAGNOSIS — Z7951 Long term (current) use of inhaled steroids: Secondary | ICD-10-CM | POA: Diagnosis not present

## 2020-05-20 DIAGNOSIS — I10 Essential (primary) hypertension: Secondary | ICD-10-CM | POA: Insufficient documentation

## 2020-05-20 DIAGNOSIS — R0602 Shortness of breath: Secondary | ICD-10-CM | POA: Diagnosis not present

## 2020-05-20 DIAGNOSIS — J189 Pneumonia, unspecified organism: Secondary | ICD-10-CM | POA: Diagnosis not present

## 2020-05-20 DIAGNOSIS — Z955 Presence of coronary angioplasty implant and graft: Secondary | ICD-10-CM | POA: Insufficient documentation

## 2020-05-20 DIAGNOSIS — J441 Chronic obstructive pulmonary disease with (acute) exacerbation: Secondary | ICD-10-CM | POA: Insufficient documentation

## 2020-05-20 MED ORDER — AMOXICILLIN-POT CLAVULANATE 875-125 MG PO TABS: 1.0000 | ORAL_TABLET | Freq: Two times a day (BID) | ORAL | 0 refills | Status: DC

## 2020-05-20 MED ORDER — AZITHROMYCIN 250 MG PO TABS
500.0000 mg | ORAL_TABLET | Freq: Once | ORAL | Status: AC
  Administered 2020-05-20: 500 mg via ORAL
  Filled 2020-05-20: qty 2

## 2020-05-20 MED ORDER — PREDNISONE 20 MG PO TABS: 40.0000 mg | ORAL_TABLET | Freq: Every day | ORAL | 0 refills | Status: DC

## 2020-05-20 MED ORDER — AZITHROMYCIN 250 MG PO TABS: 250.0000 mg | ORAL_TABLET | Freq: Every day | ORAL | 0 refills | Status: AC

## 2020-05-20 MED ORDER — PREDNISONE 50 MG PO TABS
60.0000 mg | ORAL_TABLET | ORAL | Status: AC
  Administered 2020-05-20: 60 mg via ORAL
  Filled 2020-05-20: qty 1

## 2020-05-20 MED ORDER — AMOXICILLIN-POT CLAVULANATE 875-125 MG PO TABS
1.0000 | ORAL_TABLET | Freq: Once | ORAL | Status: AC
  Administered 2020-05-20: 1 via ORAL
  Filled 2020-05-20: qty 1

## 2020-05-20 NOTE — ED Triage Notes (Signed)
Pt presents to ED with complaints of increased SOB since 0300. Pt wears 2L of O2 chronically.

## 2020-05-20 NOTE — Discharge Instructions (Addendum)
As discussed, you have been diagnosed with pneumonia.  With your history of COPD, it is important that you monitor your condition carefully, and take all medication as directed/prescribed.  Similarly important is that you follow-up with your physician to ensure appropriate improvement in your condition.  A COVID test has been performed and results should be available in the next day.  If this result is positive, please quarantine appropriately, and discussed this result with your physician via telephone.  Return here for any concerning changes in your condition.

## 2020-05-20 NOTE — ED Provider Notes (Signed)
[facility]Y [facility]MENT Provider Note   CSN: [id number] Arrival date & time: 05/20/20  1435     History Chief Complaint  Patient presents with  . Shortness of Breath    Jeffery [name] is a 70 y.o. male.  HPI Patient with a history of COPD, on oxygen 2 L via nasal cannula 24/7 presents with dyspnea. Patient was well until this morning about 15 hours ago, when he noticed dyspnea. Symptoms were more pronounced then and they are currently, and the patient has taken all of his home medication as well as continue to use 2 L via nasal cannula throughout the day. Currently no pain, no fever.  When asked how he is feeling patient replies" I feel fine". However given his substantial medical issues he presents for evaluation.     Past Medical History:  Diagnosis Date  . Chest pain 04/2011   Normal echocardiogram  . COPD (chronic obstructive pulmonary disease) (HCC)   . Fasting hyperglycemia    Normal hemoglobin A1c of 6.0  . GAD (generalized anxiety disorder)   . GERD (gastroesophageal reflux disease)   . Hyperlipemia   . Hypertension   . On home O2    2L N/C  . On home O2    at night  . Prediabetes 06/04/2015  . Tobacco abuse     Patient Active Problem List   Diagnosis Date Noted  . Leukocytosis 02/18/2020  . Protein-calorie malnutrition, severe 02/18/2020  . On home O2   . GERD (gastroesophageal reflux disease)   . GAD (generalized anxiety disorder)   . CAP (community acquired pneumonia) 02/17/2020  . Bilateral pneumonia 02/17/2020  . Weight loss 01/28/2019  . COPD  GOLD ?  11/04/2018  . Chronic respiratory failure with hypoxia (HCC) 10/04/2018  . Acute on chronic respiratory failure with hypoxia (HCC) 11/07/2016  . Prediabetes 06/04/2015  . Hyperglycemia, drug-induced 06/03/2015  . Essential hypertension 06/03/2015  . COPD exacerbation (HCC) 06/02/2015  . Hyperlipidemia 07/20/2011  . Fasting hyperglycemia   . Tobacco abuse   . Chest pain 04/02/2011     Past Surgical History:  Procedure Laterality Date  . APPENDECTOMY    . APPENDECTOMY    . CATARACT EXTRACTION W/PHACO Left 06/10/2019   Procedure: CATARACT EXTRACTION PHACO AND INTRAOCULAR LENS PLACEMENT (IOC);  Surgeon: Fabio PierceWrzosek, James, MD;  Location: [facility];  Service: Ophthalmology;  Laterality: Left;  CDE: 3.73  . CATARACT EXTRACTION W/PHACO Right 06/24/2019   Procedure: CATARACT EXTRACTION PHACO AND INTRAOCULAR LENS PLACEMENT (IOC);  Surgeon: Fabio PierceWrzosek, James, MD;  Location: [facility];  Service: Ophthalmology;  Laterality: Right;  CDE: 4.42  . LEFT HEART CATHETERIZATION WITH CORONARY ANGIOGRAM N/A 05/17/2011   Procedure: LEFT HEART CATHETERIZATION WITH CORONARY ANGIOGRAM;  Surgeon: Tonny BollmanMichael Cooper, MD;  [facility];  Service: Cardiovascular;  Laterality: N/A;       Family History  Problem Relation Age of Onset  . Emphysema Father     Social History   Tobacco Use  . Smoking status: Former Smoker    Packs/day: 1.00    Years: 35.00    Pack years: 35.00    Types: Cigarettes    Quit date: 05/09/2013    Years since quitting: 7.0  . Smokeless tobacco: Never Used  Vaping Use  . Vaping Use: Never used  Substance Use Topics  . Alcohol use: No  . Drug use: No    Home Medications Prior to Admission medications   Medication Sig Start Date End Date Taking? Authorizing Provider  albuterol (PROAIR HFA) 108 (90 Base) MCG/ACT inhaler Inhale 1-2 puffs into the lungs every 4 (four) hours as needed. Patient taking differently: Inhale 1-2 puffs into the lungs every 4 (four) hours as needed for wheezing or shortness of breath.  11/16/18   Nyoka Cowden, MD  ALPRAZolam Prudy Feeler) 0.5 MG tablet Take 0.5 mg by mouth 2 (two) times daily. Takes 1/2 tab in the morning and 1 tablet at bedtime    [provider]  aspirin EC 81 MG tablet Take 81 mg by mouth daily.    [provider]  chlorthalidone (HYGROTON) 25 MG tablet Take 12.5 mg by mouth daily.  Patient not taking: Reported  on 03/16/2020    [provider]  famotidine (PEPCID) 40 MG tablet 1 daily after supper Patient taking differently: Take 40 mg by mouth 2 (two) times daily.  10/03/18   Nyoka Cowden, MD  Fluticasone-Umeclidin-Vilant (TRELEGY ELLIPTA) 100-62.5-25 MCG/INH AEPB One click each am 10/18/19   Nyoka Cowden, MD  Fluticasone-Umeclidin-Vilant (TRELEGY ELLIPTA) 100-62.5-25 MCG/INH AEPB Inhale 1 puff into the lungs daily. 03/16/20   Nyoka Cowden, MD  furosemide (LASIX) 20 MG tablet Take 10 mg by mouth daily. 11/27/19   [provider]  gabapentin (NEURONTIN) 300 MG capsule Take 300 mg by mouth 3 (three) times daily.  03/22/13   [provider]  isosorbide mononitrate (IMDUR) 30 MG 24 hr tablet Take 30 mg by mouth daily. 03/22/14   [provider]  MATZIM LA 240 MG 24 hr tablet Take 240 mg by mouth daily. 04/16/19   [provider]  metFORMIN (GLUCOPHAGE) 500 MG tablet Take 500 mg by mouth daily.    [provider]  mirtazapine (REMERON) 15 MG tablet Take 7.5 mg by mouth at bedtime. 02/12/20   [provider]  Multiple Vitamin (MULTIVITAMIN WITH MINERALS) TABS tablet Take 1 tablet by mouth daily.    [provider]  omeprazole (PRILOSEC) 20 MG capsule Take 20 mg by mouth daily.    [provider]  OXYGEN 2lpm 24/7    [provider]  potassium chloride SA (K-DUR,KLOR-CON) 20 MEQ tablet Take 20 mEq by mouth daily.  05/12/14   [provider]  predniSONE (DELTASONE) 5 MG tablet Prednisone is 20 mg per day  Until  (5mg  x 4 pills) until better then taper to 5 mg  - if worse go back up Patient taking differently: Take 5 mg by mouth daily with breakfast. Take 1 tablet (5 mg) by mouth scheduled each day, may increase to 20 mg if needed for respiratory issues 04/29/19   05/01/19, MD  simvastatin (ZOCOR) 20 MG tablet Take 20 mg by mouth at bedtime.  03/22/14   [provider]  sucralfate (CARAFATE) 1 g  tablet Take 1 g by mouth 2 (two) times daily.  07/19/18   [provider]  telmisartan (MICARDIS) 40 MG tablet TAKE 1 TABLET BY MOUTH ONCE A DAY. Patient taking differently: Take 40 mg by mouth daily.  10/28/19   10/30/19, MD  UNABLE TO FIND Med Name: buffered salt supplement twice daily    [provider]    Allergies    Codeine  Review of Systems   Review of Systems  Constitutional:       Per HPI, otherwise negative  HENT:       Per HPI, otherwise negative  Respiratory:       Per HPI, otherwise negative  Cardiovascular:  Per HPI, otherwise negative  Gastrointestinal: Negative for vomiting.  Endocrine:       Negative aside from HPI  Genitourinary:       Neg aside from HPI   Musculoskeletal:       Per HPI, otherwise negative  Skin: Negative.   Neurological: Negative for syncope.    Physical Exam Updated Vital Signs BP 123/73 (BP Location: Right Arm)   Pulse 77   Temp 99.6 F (37.6 C) (Oral)   Resp 18   Ht 5\' 6"  (1.676 m)   Wt 52.2 kg   SpO2 100%   BMI 18.56 kg/m   Physical Exam Vitals and nursing note reviewed.  Constitutional:      General: He is not in acute distress.    Appearance: He is not ill-appearing.     Comments: Thin adult male awake and alert sitting upright.  2 L via nasal cannula in place.  HENT:     Head: Normocephalic and atraumatic.  Eyes:     Extraocular Movements: EOM normal.     Conjunctiva/sclera: Conjunctivae normal.  Cardiovascular:     Rate and Rhythm: Normal rate and regular rhythm.  Pulmonary:     Effort: Pulmonary effort is normal. No respiratory distress.     Breath sounds: No stridor. Decreased breath sounds present.  Abdominal:     General: There is no distension.  Musculoskeletal:        General: No edema.  Skin:    General: Skin is warm and dry.  Neurological:     Mental Status: He is alert and oriented to person, place, and time.  Psychiatric:        Mood and Affect: Mood and affect  normal.     ED Results / Procedures / Treatments   Labs (all labs ordered are listed, but only abnormal results are displayed) Labs Reviewed  SARS CORONAVIRUS 2 (TAT 6-24 HRS)    EKG EKG Interpretation  Date/Time:  Wednesday May 20 2020 14:42:34 EST Ventricular Rate:  94 PR Interval:  158 QRS Duration: 82 QT Interval:  336 QTC Calculation: 420 R Axis:   76 Text Interpretation: Normal sinus rhythm Nonspecific T wave abnormality No significant change since last tracing Abnormal ECG Confirmed by [id number] [phone]) on 05/20/2020 10:01:59 PM   Radiology DG Chest 2 View  Result Date: 05/20/2020 CLINICAL DATA:  Shortness of breath, bilateral pneumonia November EXAM: CHEST - 2 VIEW COMPARISON:  Chest x-ray 03/16/2020 FINDINGS: The heart size and mediastinal contours are unchanged. Round density at the peripheral left base external to patient consistent with EKG leads sticker. Well-defined round density overlying the right mid lung zone likely represents a nipple shadow similar findings on the left. Vague patchy airspace opacities at the lung bases. Biapical pleural/pulmonary scarring. No pulmonary edema. No pleural effusion. No pneumothorax. No acute osseous abnormality. IMPRESSION: 1. Vague patchy airspace opacity at the lung bases that could represent a combination of scarring, atelectasis, infection/inflammation. Followup PA and lateral chest X-ray is recommended in 3-4 weeks following trial therapy to ensure resolution and exclude underlying malignancy. 2. Repeat chest x-ray recommended to be obtained with nipple markers. Electronically Signed   By: 03/18/2020 M.D.   On: 05/20/2020 15:07    Procedures Procedures (including critical care time)  Medications Ordered in ED Medications  amoxicillin-clavulanate (AUGMENTIN) 875-125 MG per tablet 1 tablet (has no administration in time range)  azithromycin (ZITHROMAX) tablet 500 mg (has no administration in time range)   predniSONE (DELTASONE) tablet 60  mg (has no administration in time range)    ED Course  I have reviewed the triage vital signs and the nursing notes.  Pertinent labs & imaging results that were available during my care of the patient were reviewed by me and considered in my medical decision making (see chart for details).   After the initial evaluation I reviewed the patient's medical records, hospitalization last year for pneumonia.  Patient's x-ray also reviewed, and subsequently discussed with him, evidence for pneumonia. Patient was placed on continuous pulse oximetry, 100% on 2 L via nasal cannula, abnormal, but typical for him. Patient is thin, no evidence for fluid overload status, no wheezing, crackles, and again no new oxygen requirement suggesting decompensated heart failure either.  Patient has no chest pain suggesting atypical ACS.  Vital signs, absence of complaints reassuring for low suspicion of bacteremia, sepsis. Patient has no evidence for stress, decompensated state, no new oxygen requirement, he is generally well-appearing, states that he feels fine. I discussed admission versus initiation of antibiotics, close outpatient therapy with him, and he was amenable to this option. Patient had COVID test sent from the ED, but given the generally reassuring findings as above, patient discharged in stable condition.  Final Clinical Impression(s) / ED Diagnoses Final diagnoses:  Community acquired pneumonia, bilateral    Rx / DC Orders ED Discharge Orders         Ordered    azithromycin (ZITHROMAX) 250 MG tablet  Daily        05/20/20 2238    amoxicillin-clavulanate (AUGMENTIN) 875-125 MG tablet  Every 12 hours        05/20/20 2238    predniSONE (DELTASONE) 20 MG tablet  Daily with breakfast        05/20/20 2238           Gerhard Munch, MD 05/20/20 2238

## 2020-05-21 LAB — SARS CORONAVIRUS 2 (TAT 6-24 HRS): SARS Coronavirus 2: NEGATIVE

## 2020-05-22 ENCOUNTER — Telehealth: Payer: Self-pay | Admitting: Pulmonary Disease

## 2020-05-22 ENCOUNTER — Other Ambulatory Visit: Payer: Self-pay | Admitting: Internal Medicine

## 2020-05-22 NOTE — Telephone Encounter (Signed)
[facility]:  We were contacted by the [facility].  Recommendations: abnormal CXR Appointment requested: follow up with Dr. Sherene Sires  Time frame: 3-4 weeks   Franne G[facility] 05/22/2020 12:39 PM

## 2020-05-22 NOTE — Telephone Encounter (Signed)
-----   Message from Novella Olive sent at 05/22/2020 11:22 AM EST ----- Regarding: Radiology [place] Up 1. Vague patchy airspace opacity at the lung bases that could represent a combination of scarring, atelectasis, infection/inflammation. Followup PA and lateral chest X-ray is recommended in 3-4 weeks following trial therapy to ensure resolution and exclude underlying malignancy.  2. Repeat chest x-ray recommended to be obtained with nipple markers.

## 2020-05-22 NOTE — Telephone Encounter (Signed)
Patient scheduled with Dr. Sherene Sires in [place] on 06/22/2020-pr

## 2020-06-02 DIAGNOSIS — Z681 Body mass index (BMI) 19 or less, adult: Secondary | ICD-10-CM | POA: Diagnosis not present

## 2020-06-02 DIAGNOSIS — K5900 Constipation, unspecified: Secondary | ICD-10-CM | POA: Diagnosis not present

## 2020-06-03 DIAGNOSIS — J9611 Chronic respiratory failure with hypoxia: Secondary | ICD-10-CM | POA: Diagnosis not present

## 2020-06-03 DIAGNOSIS — J449 Chronic obstructive pulmonary disease, unspecified: Secondary | ICD-10-CM | POA: Diagnosis not present

## 2020-06-11 ENCOUNTER — Encounter (HOSPITAL_COMMUNITY): Payer: Self-pay | Admitting: Emergency Medicine

## 2020-06-11 ENCOUNTER — Other Ambulatory Visit: Payer: Self-pay

## 2020-06-11 ENCOUNTER — Emergency Department (HOSPITAL_COMMUNITY)
Admission: EM | Admit: 2020-06-11 | Discharge: 2020-06-11 | Disposition: A | Discharge status: Home / Self Care | Payer: Medicare Other | Attending: Emergency Medicine | Admitting: Emergency Medicine

## 2020-06-11 ENCOUNTER — Emergency Department (HOSPITAL_COMMUNITY): Payer: Medicare Other

## 2020-06-11 DIAGNOSIS — Z7982 Long term (current) use of aspirin: Secondary | ICD-10-CM | POA: Insufficient documentation

## 2020-06-11 DIAGNOSIS — I10 Essential (primary) hypertension: Secondary | ICD-10-CM | POA: Diagnosis not present

## 2020-06-11 DIAGNOSIS — J439 Emphysema, unspecified: Secondary | ICD-10-CM | POA: Diagnosis not present

## 2020-06-11 DIAGNOSIS — Z79899 Other long term (current) drug therapy: Secondary | ICD-10-CM | POA: Diagnosis not present

## 2020-06-11 DIAGNOSIS — R109 Unspecified abdominal pain: Secondary | ICD-10-CM | POA: Insufficient documentation

## 2020-06-11 DIAGNOSIS — R7303 Prediabetes: Secondary | ICD-10-CM | POA: Insufficient documentation

## 2020-06-11 DIAGNOSIS — Z7984 Long term (current) use of oral hypoglycemic drugs: Secondary | ICD-10-CM | POA: Insufficient documentation

## 2020-06-11 DIAGNOSIS — Z87891 Personal history of nicotine dependence: Secondary | ICD-10-CM | POA: Insufficient documentation

## 2020-06-11 DIAGNOSIS — J441 Chronic obstructive pulmonary disease with (acute) exacerbation: Secondary | ICD-10-CM | POA: Insufficient documentation

## 2020-06-11 DIAGNOSIS — Z7951 Long term (current) use of inhaled steroids: Secondary | ICD-10-CM | POA: Diagnosis not present

## 2020-06-11 DIAGNOSIS — R0602 Shortness of breath: Secondary | ICD-10-CM | POA: Diagnosis not present

## 2020-06-11 LAB — CBC
HCT: 37.9 % — ABNORMAL LOW (ref 39.0–52.0)
Hemoglobin: 11.5 g/dL — ABNORMAL LOW (ref 13.0–17.0)
MCH: 28.3 pg (ref 26.0–34.0)
MCHC: 30.3 g/dL (ref 30.0–36.0)
MCV: 93.3 fL (ref 80.0–100.0)
Platelets: 244 10*3/uL (ref 150–400)
RBC: 4.06 MIL/uL — ABNORMAL LOW (ref 4.22–5.81)
RDW: 15.2 % (ref 11.5–15.5)
WBC: 8.2 10*3/uL (ref 4.0–10.5)
nRBC: 0 % (ref 0.0–0.2)

## 2020-06-11 LAB — BASIC METABOLIC PANEL
Anion gap: 8 (ref 5–15)
BUN: 13 mg/dL (ref 8–23)
CO2: 30 mmol/L (ref 22–32)
Calcium: 9.3 mg/dL (ref 8.9–10.3)
Chloride: 101 mmol/L (ref 98–111)
Creatinine, Ser: 0.71 mg/dL (ref 0.61–1.24)
GFR, Estimated: >60 mL/min (ref >60)
Glucose, Bld: 77 mg/dL (ref 70–99)
Potassium: 4 mmol/L (ref 3.5–5.1)
Sodium: 139 mmol/L (ref 135–145)

## 2020-06-11 LAB — TROPONIN I (HIGH SENSITIVITY)
Troponin I (High Sensitivity): <2 ng/L (ref <18)
Troponin I (High Sensitivity): <2 ng/L (ref <18)

## 2020-06-11 MED ORDER — PREDNISONE 20 MG PO TABS
20.0000 mg | ORAL_TABLET | Freq: Once | ORAL | Status: AC
  Administered 2020-06-11: 20 mg via ORAL
  Filled 2020-06-11: qty 1

## 2020-06-11 MED ORDER — PREDNISONE 5 MG PO TBEC: DELAYED_RELEASE_TABLET | ORAL | 0 refills | Status: DC

## 2020-06-11 NOTE — Discharge Instructions (Addendum)
As discussed, take the increased dosing of prednisone for the next 9 days as discussed, get this new prescription picked up from your pharmacy.  You have received a 20 mg dose today, take your next dose of this tomorrow.  When you have finished the 10 mg dosing you can then return to your regular daily 5 mg tablets.  Get rechecked for any worsening symptoms either here, with your primary MD or Dr. Sherene Sires.  Your exam and x-rays are reassuring today.

## 2020-06-11 NOTE — ED Notes (Signed)
Patient ambulated in hall on 2 lpm o2.  Patient's o2 sat maintained at 96-100%.  Patient denies any shortness of breath

## 2020-06-11 NOTE — ED Triage Notes (Signed)
Pt reports SHOB that is worse with exertion. Pt also reports abdominal pain. Pt on 2L chronically.

## 2020-06-11 NOTE — ED Provider Notes (Signed)
[facility] EMERGENCY DEPARTMENT Provider Note   CSN: [id number] Arrival date & time: 06/11/20  1009     History Chief Complaint  Patient presents with  . Shortness of Breath    Jeffery [name] is a 70 y.o. male with a history of COPD on chronic home oxygen 2L, presenting with a one week history of increasing shortness of breath with exertion.  He describes normally being able to walk freely through his home with oxygen in place, noticing sob when he walks from one end of his home to the other.  He denies cough, fever, chest pain, peripheral edema and orthopnea. He does endorse wheezing when sob is present.  Currently reports feeling baseline with breathing.  He is under the care of Dr. Sherene Sires, takes prednisone 5 mg daily, also using his inhaled medications as prescribed. He last used his albuterol rescue inhaler early this am.  Was treated for pneumonia last month and is concerned about return of this. He completed his entire course of antibiotics.  Fully covid vaccinated.  Note in triage note complaint of abdominal pain.  Pt reports he has chronic abdominal pain secondary to GERD which is treated with pepcid, omeprazole and carafate . He denies any new or escalating sx today, no n/v/d, abdominal distention. Appetite has been fair.  The history is provided by the patient.       Past Medical History:  Diagnosis Date  . Chest pain 04/2011   Normal echocardiogram  . COPD (chronic obstructive pulmonary disease) (HCC)   . Fasting hyperglycemia    Normal hemoglobin A1c of 6.0  . GAD (generalized anxiety disorder)   . GERD (gastroesophageal reflux disease)   . Hyperlipemia   . Hypertension   . On home O2    2L N/C  . On home O2    at night  . Prediabetes 06/04/2015  . Tobacco abuse     Patient Active Problem List   Diagnosis Date Noted  . Leukocytosis 02/18/2020  . Protein-calorie malnutrition, severe 02/18/2020  . On home O2   . GERD (gastroesophageal reflux disease)   . GAD  (generalized anxiety disorder)   . CAP (community acquired pneumonia) 02/17/2020  . Bilateral pneumonia 02/17/2020  . Weight loss 01/28/2019  . COPD  GOLD ?  11/04/2018  . Chronic respiratory failure with hypoxia (HCC) 10/04/2018  . Acute on chronic respiratory failure with hypoxia (HCC) 11/07/2016  . Prediabetes 06/04/2015  . Hyperglycemia, drug-induced 06/03/2015  . Essential hypertension 06/03/2015  . COPD exacerbation (HCC) 06/02/2015  . Hyperlipidemia 07/20/2011  . Fasting hyperglycemia   . Tobacco abuse   . Chest pain 04/02/2011    Past Surgical History:  Procedure Laterality Date  . APPENDECTOMY    . APPENDECTOMY    . CATARACT EXTRACTION W/PHACO Left 06/10/2019   Procedure: CATARACT EXTRACTION PHACO AND INTRAOCULAR LENS PLACEMENT (IOC);  Surgeon: Fabio Pierce, MD;  Location: [facility];  Service: Ophthalmology;  Laterality: Left;  CDE: 3.73  . CATARACT EXTRACTION W/PHACO Right 06/24/2019   Procedure: CATARACT EXTRACTION PHACO AND INTRAOCULAR LENS PLACEMENT (IOC);  Surgeon: Fabio Pierce, MD;  Location: [facility];  Service: Ophthalmology;  Laterality: Right;  CDE: 4.42  . LEFT HEART CATHETERIZATION WITH CORONARY ANGIOGRAM N/A 05/17/2011   Procedure: LEFT HEART CATHETERIZATION WITH CORONARY ANGIOGRAM;  Surgeon: Tonny Bollman, MD;  Location: [facility] CATH LAB;  Service: Cardiovascular;  Laterality: N/A;       Family History  Problem Relation Age of Onset  . Emphysema Father  Social History   Tobacco Use  . Smoking status: Former Smoker    Packs/day: 1.00    Years: 35.00    Pack years: 35.00    Types: Cigarettes    Quit date: 05/09/2013    Years since quitting: 7.0  . Smokeless tobacco: Never Used  Vaping Use  . Vaping Use: Never used  Substance Use Topics  . Alcohol use: No  . Drug use: No    Home Medications Prior to Admission medications   Medication Sig Start Date End Date Taking? Authorizing Provider  albuterol (PROAIR HFA) 108 (90 Base) MCG/ACT inhaler Inhale 1-2  puffs into the lungs every 4 (four) hours as needed. Patient taking differently: Inhale 1-2 puffs into the lungs every 4 (four) hours as needed for wheezing or shortness of breath. 11/16/18  Yes Nyoka Cowden, MD  ALPRAZolam Prudy Feeler) 0.5 MG tablet Take 0.5 mg by mouth 2 (two) times daily. Takes 1/2 tab in the morning and 1 tablet at bedtime   Yes [provider]  aspirin EC 81 MG tablet Take 81 mg by mouth daily.   Yes [provider]  famotidine (PEPCID) 40 MG tablet 1 daily after supper Patient taking differently: Take 40 mg by mouth 2 (two) times daily. 10/03/18  Yes Nyoka Cowden, MD  furosemide (LASIX) 20 MG tablet Take 10 mg by mouth daily. 11/27/19  Yes [provider]  gabapentin (NEURONTIN) 300 MG capsule Take 300 mg by mouth 3 (three) times daily.  03/22/13  Yes [provider]  isosorbide mononitrate (IMDUR) 30 MG 24 hr tablet Take 30 mg by mouth daily. 03/22/14  Yes [provider]  MATZIM LA 240 MG 24 hr tablet Take 240 mg by mouth daily. 04/16/19  Yes [provider]  metFORMIN (GLUCOPHAGE) 500 MG tablet Take 500 mg by mouth daily.   Yes [provider]  mirtazapine (REMERON) 15 MG tablet Take 7.5 mg by mouth at bedtime. 02/12/20  Yes [provider]  Multiple Vitamin (MULTIVITAMIN WITH MINERALS) TABS tablet Take 1 tablet by mouth daily.   Yes [provider]  omeprazole (PRILOSEC) 20 MG capsule Take 20 mg by mouth daily.   Yes [provider]  OXYGEN 2lpm 24/7   Yes [provider]  polyethylene glycol (MIRALAX / GLYCOLAX) 17 g packet Take 17 g by mouth daily.   Yes [provider]  potassium chloride SA (K-DUR,KLOR-CON) 20 MEQ tablet Take 20 mEq by mouth daily.  05/12/14  Yes [provider]  predniSONE (DELTASONE) 5 MG tablet Take 5 mg by mouth daily with breakfast.   Yes [provider]  predniSONE 5 MG TBEC Take 4 tablets for 2 additional days starting  tomorrow. Then take 3 tablets daily for 3 days, then 2 tablets daily for 3 days. 06/11/20  Yes Torre Pikus, Raynelle Fanning, PA-C  senna (SENOKOT) 8.6 MG TABS tablet Take 1 tablet by mouth daily as needed for mild constipation.   Yes [provider]  simvastatin (ZOCOR) 20 MG tablet Take 20 mg by mouth at bedtime.  03/22/14  Yes [provider]  sucralfate (CARAFATE) 1 g tablet Take 1 g by mouth 2 (two) times daily.  07/19/18  Yes [provider]  telmisartan (MICARDIS) 40 MG tablet TAKE 1 TABLET BY MOUTH ONCE A DAY. Patient taking differently: Take 40 mg by mouth daily. 10/28/19  Yes Nyoka Cowden, MD  TRELEGY ELLIPTA 100-62.5-25 MCG/INH AEPB INHALE 1 PUFF INTO THE LUNGS EACH MORNING. 05/22/20  Yes Nyoka Cowden, MD  amoxicillin-clavulanate (AUGMENTIN) 875-125 MG tablet Take 1 tablet by mouth every 12 (twelve) hours. Patient not taking: No sig reported 05/20/20   Gerhard Munch, MD  chlorthalidone (HYGROTON) 25 MG tablet Take 12.5 mg by mouth daily.  Patient not taking: No sig reported    [provider]  Fluticasone-Umeclidin-Vilant (TRELEGY ELLIPTA) 100-62.5-25 MCG/INH AEPB One click each am 10/18/19   Nyoka Cowden, MD  Fluticasone-Umeclidin-Vilant (TRELEGY ELLIPTA) 100-62.5-25 MCG/INH AEPB Inhale 1 puff into the lungs daily. 03/16/20   Nyoka Cowden, MD    Allergies    Codeine  Review of Systems   Review of Systems  Constitutional: Negative for chills and fever.  HENT: Negative for congestion and sore throat.   Eyes: Negative.   Respiratory: Positive for shortness of breath and wheezing. Negative for chest tightness.   Cardiovascular: Negative for chest pain, palpitations and leg swelling.  Gastrointestinal: Negative for abdominal distention, abdominal pain, nausea and vomiting.       Negative except as mentioned in HPI.   Genitourinary: Negative.   Musculoskeletal: Negative for arthralgias, joint swelling and neck pain.  Skin: Negative.  Negative for rash  and wound.  Neurological: Negative for dizziness, weakness, light-headedness, numbness and headaches.  Psychiatric/Behavioral: Negative.   All other systems reviewed and are negative.   Physical Exam Updated Vital Signs BP 138/75 (BP Location: Left Arm)   Pulse 93   Temp 98.3 F (36.8 C)   Resp (!) 22   SpO2 100%   Physical Exam Vitals and nursing note reviewed.  Constitutional:      Appearance: He is well-developed and well-nourished.  HENT:     Head: Normocephalic and atraumatic.  Eyes:     Conjunctiva/sclera: Conjunctivae normal.  Cardiovascular:     Rate and Rhythm: Normal rate and regular rhythm.     Pulses: Intact distal pulses.     Heart sounds: Normal heart sounds.  Pulmonary:     Effort: Pulmonary effort is normal. No tachypnea, accessory muscle usage or respiratory distress.     Breath sounds: Normal breath sounds. No wheezing or rhonchi.     Comments: Distant breath sounds throughout. No wheeze, rhonchi, rales, no accessory muscle use.  Pt appears comfortable with his breathing. Abdominal:     General: Abdomen is flat. Bowel sounds are normal. There is no distension.     Palpations: Abdomen is soft.     Tenderness: There is no abdominal tenderness. There is no guarding.     Comments: Benign abdominal exam.  Musculoskeletal:        General: Normal range of motion.     Cervical back: Normal range of motion.  Skin:    General: Skin is warm and dry.  Neurological:     Mental Status: He is alert.  Psychiatric:        Mood and Affect: Mood and affect normal.     ED Results / Procedures / Treatments   Labs (all labs ordered are listed, but only abnormal results are displayed) Labs Reviewed  CBC - Abnormal; Notable for the following components:      Result Value   RBC 4.06 (*)    Hemoglobin 11.5 (*)    HCT 37.9 (*)    All other components within normal limits  BASIC METABOLIC PANEL  TROPONIN I (HIGH SENSITIVITY)  TROPONIN I (HIGH SENSITIVITY)     EKG EKG Interpretation  Date/Time:  Thursday June 11 2020 10:38:36 EST Ventricular Rate:  94 PR  Interval:  152 QRS Duration: 84 QT Interval:  346 QTC Calculation: 432 R Axis:   73 Text Interpretation: Normal sinus rhythm Right atrial enlargement Nonspecific T wave abnormality Abnormal ECG No significant change since last tracing Confirmed by Susy Frizzle [phone]) on 06/11/2020 5:14:13 PM   Radiology DG Chest Portable 1 View  Result Date: 06/11/2020 CLINICAL DATA:  70 year old male with shortness of breath. Bilateral pneumonia November. EXAM: PORTABLE CHEST 1 VIEW COMPARISON:  Chest radiographs 05/20/2020 and earlier. FINDINGS: Portable AP upright view at 1051 hours. Chronic pulmonary hyperinflation. Attenuation of bronchial vascular markings also with evidence of emphysema on CT Abdomen and Pelvis last year. Mediastinal contours remain within normal limits. Visualized tracheal air column is within normal limits. EKG button artifact in both lungs. No pneumothorax or acute pulmonary opacity. Negative visible bowel gas. No acute osseous abnormality identified. IMPRESSION: Chronic lung disease with suspected Emphysema. No acute cardiopulmonary abnormality. Electronically Signed   By: Odessa Fleming M.D.   On: 06/11/2020 11:04    Procedures Procedures   Medications Ordered in ED Medications  predniSONE (DELTASONE) tablet 20 mg (20 mg Oral Given 06/11/20 1700)    ED Course  I have reviewed the triage vital signs and the nursing notes.  Pertinent labs & imaging results that were available during my care of the patient were reviewed by me and considered in my medical decision making (see chart for details).    MDM Rules/Calculators/A&P                          Pt's labs, imaging and ekg reviewed, exam reassuring including ambulation of pt in dept with no increased work of breathing, complaint of sob or oxygen desaturation.  Delta troponins stable, doubt cardiac source.  No pneumonia,  clinically not chf.  VSS, doubt PE.    Review of chart indicates pt has been given permission to do a 20 mg back to 5 mg prednisone taper with COPD flares by his pulmonologist.  This was discussed with pt and he agrees to this taper, concern for running out of his prednisone early so additional prescription given.  He has f/u with Dr. Sherene Sires on 2/22, plan to keep this appt, getting rechecked sooner for an exacerbation of sx. Pt understands and agrees to plan.  The patient appears reasonably screened and/or stabilized for discharge and I doubt any other medical condition or other [facility] requiring further screening, evaluation, or treatment in the ED at this time prior to discharge.  Final Clinical Impression(s) / ED Diagnoses Final diagnoses:  COPD exacerbation [facility])    Rx / DC Orders ED Discharge Orders         Ordered    predniSONE 5 MG TBEC        06/11/20 1655           Burgess Amor, Cordelia Poche 06/14/20 [id number]    Bethann Berkshire, MD 06/14/20 [phone]

## 2020-06-22 ENCOUNTER — Other Ambulatory Visit: Payer: Self-pay | Admitting: Internal Medicine

## 2020-06-22 ENCOUNTER — Ambulatory Visit: Payer: Medicare Other | Admitting: Internal Medicine

## 2020-06-22 NOTE — Telephone Encounter (Signed)
Pharmacy refill request for telmisartan. Order is pended below if you are ok with refilling.

## 2020-06-23 ENCOUNTER — Other Ambulatory Visit: Payer: Self-pay

## 2020-06-23 ENCOUNTER — Encounter: Payer: Self-pay | Admitting: Internal Medicine

## 2020-06-23 ENCOUNTER — Ambulatory Visit: Payer: Medicare Other | Admitting: Internal Medicine

## 2020-06-23 DIAGNOSIS — J9611 Chronic respiratory failure with hypoxia: Secondary | ICD-10-CM

## 2020-06-23 DIAGNOSIS — J449 Chronic obstructive pulmonary disease, unspecified: Secondary | ICD-10-CM | POA: Diagnosis not present

## 2020-06-23 MED ORDER — PREDNISONE 5 MG PO TABS: ORAL_TABLET | ORAL | 2 refills | Status: DC

## 2020-06-23 NOTE — Assessment & Plan Note (Signed)
02 2lpm 24/7 as of 10/03/2018  -  10/18/2019   Walked RA  approx   300 ft  @ moderate to slow   pace  stopped due to  Sob  With sats still 92%    Advised: Make sure you check your oxygen saturation  at your highest level of activity  to be sure it stays over 90% and adjust  02 flow upward to maintain this level if needed but remember to turn it back to previous settings when you stop (to conserve your supply).           Each maintenance medication was reviewed in detail including emphasizing most importantly the difference between maintenance and prns and under what circumstances the prns are to be triggered using an action plan format where appropriate.  Total time for H and P, chart review, counseling, reviewing elipta device(s) and generating customized AVS unique to this office visit / same day charting = 30 min

## 2020-06-23 NOTE — Patient Instructions (Addendum)
Plan A = Automatic = Always=    Trelegy one click each am  Plan B = Backup (to supplement plan A, not to replace it) Only use your albuterol inhaler as a rescue medication to be used if you can't catch your breath by resting or doing a relaxed purse lip breathing pattern.  - The less you use it, the better it will work when you need it. - Ok to use the inhaler up to 2 puffs  every 4 hours if you must but call for appointment if use goes up over your usual need - Don't leave home without it !!  (think of it like the spare tire for your car)   Plan C = Crisis (instead of Plan B but only if Plan B stops working) - only use your albuterol nebulizer if you first try Plan B and it fails to help > ok to use the nebulizer up to every 4 hours but if start needing it regularly call for immediate appointment   Plan D = Deltasone = prednisone  4 daily until ALL better then wean to one half if feel great     Plan E = ER - go to ER or call 911 if all else fails    Make sure you check your oxygen saturations at highest level of activity to be sure it stays over 90% and adjust  02 flow upward to maintain this level if needed but remember to turn it back to previous settings when you stop (to conserve your supply).    Please schedule a follow up visit in 3 months but call sooner if needed  - PFTs on return and bring all inhalers/ solutions

## 2020-06-23 NOTE — Progress Notes (Signed)
Jeffery [name], male    DOB: 09-02-1950,    MRN: [id number]   Brief patient profile:  32  yobm quit smoking 05/2013 with onset of doe around 2013 and at the point he quit smoking already on neb qid and then placed on variable inhalers and 02 but does not recall ever having  pfts  Seen in ER x 6 x in 2020 so referred to pulmonary clinic 10/03/2018 by Dr   Kreg Shropshire steroid dep x 2019.    History of Present Illness  10/03/2018  Pulmonary/ 1st office eval/Mickie Kozikowski  On ACEi/dpi's including spiriva  Chief Complaint  Patient presents with  . Consult    Consult WP:[id number]. States his breathing has its good and bad days. States he has been on oxygen for about 2 years and also uses at night.   Dyspnea:  Much better p ER rx x 6  But only temporarily while  On pred 40 mg then back to 5 mg and flares w/in a week  Cough: dry/choking sensation/ worse when use inhalers esp dpi's Sleep: prone / bed is flat SABA use: multiple and not helpful  02 2lpm hs  rec Take omeprazole 20 mg x 2  About 30-60 before your first meal Take famotidine 40 mg after supper or before bed  Stop all inhalers and just use the nebulizer = duoneb ipratropium /albuterol breafast lunch supper and bedtime automatically. Only use your albuterol nebulizer as a rescue medication  Stop benazapril and start  micardis (telmisartan) 40 mg one daily in its place and call me if there is a problem  Prednisone 20 mg this evening,  Then 20 mg x 2 days, 10 mg x 2 days then stay on 5 mg daily but if get worse go back to 20 mg each am and start over   10/31/2018  f/u ov/[place] Ndiaye re:  Copd/ ? acei contributing to some of his symptoms?  Chief Complaint  Patient presents with  . Follow-up    Breathing is doing well today. He states his throat feels dry.    Dyspnea:  Walked to stop sign and back s stopping x two = 250 ft? Cough: better / no longer choking but throat still doesn't feel quite right  Sleeping: ok p xanax  SABA use: avg 2-3 daily  02:  2lpm NP  24/7  rec Reduce your telmisartan to 40 mg one half a pill daily  Duoneb=  ipratropium /albuterol breafast lunch supper and bedtime automatically. As a backup >>> Only use your albuterol nebulizer as a rescue medication to be used if you can't catch your breath by resting or doing a relaxed purse lip breathing pattern.  - The less you use it, the better it will work when you need it. - Ok to use up to  every 3  hours if you must but call for immediate appointment if use goes up over your usual need or resume prednisone at 20 mg   Prednisone is 20 mg per day  Until  (5mg  x 4 pills) until better then taper to 5 mg  - if worse go back up  Please schedule a follow up office visit in 4 weeks, sooner if needed  with all medications /inhalers/ solutions in hand so we can verify exactly what you are taking. This includes all medications from all doctors and over the counters Add: consider adding budesonide to neb next ov if can't reduce      11/28/2018  f/u ov/Rudy Luhmann re: COPD /  f/u off acei improved overall - using duoneb qid as   maint rx plus saba hfa "when over does it" then makes him shaky   Chief Complaint  Patient presents with  . Follow-up    Breathing is "so so". He is c/o shakiness and feels like his legs give out too easily. He is using his albuterol inhaler 2 x daily and Duoneb 4 x daily.   Dyspnea:  Walks to stop sign and back on 2lpm Cough: no Sleeping: ok prone flat bed  SABA use: as above 02: 2l  Lpm24/7  rec No change rx     04/29/2019  f/u ov/Nijae Doyel re: Severe copd/ 02 dep / easily confused re details of care  Chief Complaint  Patient presents with  . Follow-up    Breathing is unchanged.   Dyspnea:  No longer walking outside home, doe x room to room  Cough: minimally congested, white mucus esp in am Sleeping: ok flat one pillow SABA use: much less  02: 2lpm 24/7  rec Plan A = Automatic = Always=    budsonide / ipatropium-albuterol first thing in am in nebulizer and again  12 hours  Plan B = Backup (to supplement plan A, not to replace it) Only use your albuterol inhaler(PROAIR /red)  as a rescue medication   Plan C = Crisis (instead of Plan B but only if Plan B stops working) - only use your ipatropium/albuterol nebulizer if you first try Plan B and it fails to help > ok to use the nebulizer up to every 4 hours but if start needing it regularly call for immediate appointment Plan D = Deltasone = Prednisone  Prednisone is 20 mg per day  Until  (5mg  x 4 pills) until better then taper to 5 mg  - if worse go back up      10/18/2019  f/u ov/Gwynneth Fabio re: severe copd /taking salt supplements with newly reported leg swelling/ had both shots  Chief Complaint  Patient presents with  . Follow-up    Breathing is unchanged since the last visit. He is using his albuterol inhaler 2-3 x per day and neb with albuterol 2-3 x per day.   Dyspnea: room to room  Cough: none  Sleeping: one pillow bed is flat  SABA use: as above using neb only and very easily confused with details of care.  02: 2lpm 24/ 7  rec Plan A = Automatic = Always=    Trelegy 100 one click each am when feet hit the floor - take two good drags then brush teeth and tongue with arm and hammer toothpaste Stop the budesonide  Plan B = Backup (to supplement plan A, not to replace it) Only use your albuterol (proair)  inhaler as a rescue medication Plan C = Crisis (instead of Plan B but only if Plan B stops working) - only use your albuterol nebulizer if you first try Plan B and it fails to help > ok to use the nebulizer up to every 4 hours but if start needing it regularly call for immediate appointment Plan D = Deltasone 5 mg  4 daily until better then wean to one  Please schedule a follow up office visit in 6 weeks, sooner if needed      11/25/2019  f/u ov/[place] office/Abdalrahman Clementson re:  GOLD ? / 02 dep trelegy / pred dep/02 dep  Chief Complaint  Patient presents with  . Follow-up    Breathing is overall doing  well. He has  not used his rescue inhaler or neb.   Dyspnea:  Walking neighborhood now up to 15 min Cough: rarely Sleeping: bed is flat one pillow SABA use: none  02: 2lpm 24/7  rec  no change rx/ check 02 sats  03/16/2020  f/u ov/Carlicia Leavens re: GOLD ?  maint on trelegy  Chief Complaint  Patient presents with  . Follow-up    shortness of breath with activity  Dyspnea:  Walking neighborhood 10-15 min on 3lpm POC  Cough: none  Sleeping: bed is flat/ one pillow  SABA use: rarely but has proair and neb 02: 2lpm 2/4  And 3lpm walking  but not cheicking sats rec Plan A = Automatic = Always=    Trelegy one click each am Plan B = Backup (to supplement plan A, not to replace it) Only use your albuterol inhaler as a rescue medication Plan C = Crisis (instead of Plan B but only if Plan B stops working) - only use your albuterol nebulizer if you first try Plan B and it fails to help  Plan D = Deltasone = prednisone  4 daily until better then wean to one half if feel great  Make sure you check your oxygen saturations at highest level of activity to be sure it stays over 90% and adjust  02 flow upward to maintain this level if needed but remember to turn it back to previous settings when you stop (to conserve your supply).    06/23/2020  f/u ov/[place] office/Marjorie Deprey re: prednisone 5 mg daily  Chief Complaint  Patient presents with  . Follow-up    Breathing has been worse over the past wk. He is getting winded walking from room to room at home. He is not using his albuterol inhaler.  Dyspnea:  Variable/ not clear understood the ABCD plan as 2 recent ER trips due to sob. Cough: none now Sleeping: able to lie flat bed/ one pillow  SABA use: none 02: 2lpm except 3lpm walking  Covid status: vax x 3      No obvious day to day or daytime variability or assoc excess/ purulent sputum or mucus plugs or hemoptysis or cp or chest tightness, subjective wheeze or overt sinus or hb symptoms.   Sleeping as  above without nocturnal  or early am exacerbation  of respiratory  c/o's or need for noct saba. Also denies any obvious fluctuation of symptoms with weather or environmental changes or other aggravating or alleviating factors except as outlined above   No unusual exposure hx or h/o childhood pna/ asthma or knowledge of premature birth.  Current Allergies, Complete Past Medical History, Past Surgical History, Family History, and Social History were reviewed in [employer] record.  ROS  The following are not active complaints unless bolded Hoarseness, sore throat, dysphagia, dental problems, itching, sneezing,  nasal congestion or discharge of excess mucus or purulent secretions, ear ache,   fever, chills, sweats, unintended wt loss or wt gain, classically pleuritic or exertional cp,  orthopnea pnd or arm/hand swelling  or leg swelling, presyncope, palpitations, abdominal pain, anorexia, nausea, vomiting, diarrhea  or change in bowel habits or change in bladder habits, change in stools or change in urine, dysuria, hematuria,  rash, arthralgias, visual complaints, headache, numbness, weakness or ataxia or problems with walking or coordination,  change in mood or  memory.        Current Meds  Medication Sig  . albuterol (PROAIR HFA) 108 (90 Base) MCG/ACT inhaler Inhale 1-2 puffs into  the lungs every 4 (four) hours as needed. (Patient taking differently: Inhale 1-2 puffs into the lungs every 4 (four) hours as needed for wheezing or shortness of breath.)  . ALPRAZolam (XANAX) 0.5 MG tablet Take 0.5 mg by mouth 2 (two) times daily. Takes 1/2 tab in the morning and 1 tablet at bedtime  . aspirin EC 81 MG tablet Take 81 mg by mouth daily.  . chlorthalidone (HYGROTON) 25 MG tablet Take 12.5 mg by mouth daily.  . famotidine (PEPCID) 40 MG tablet 1 daily after supper (Patient taking differently: Take 40 mg by mouth 2 (two) times daily.)  . Fluticasone-Umeclidin-Vilant (TRELEGY ELLIPTA)  100-62.5-25 MCG/INH AEPB One click each am  . furosemide (LASIX) 20 MG tablet Take 10 mg by mouth daily.  Marland Kitchen gabapentin (NEURONTIN) 300 MG capsule Take 300 mg by mouth 3 (three) times daily.   . isosorbide mononitrate (IMDUR) 30 MG 24 hr tablet Take 30 mg by mouth daily.  Marland Kitchen MATZIM LA 240 MG 24 hr tablet Take 240 mg by mouth daily.  . metFORMIN (GLUCOPHAGE) 500 MG tablet Take 500 mg by mouth daily.  . mirtazapine (REMERON) 15 MG tablet Take 7.5 mg by mouth at bedtime.  . Multiple Vitamin (MULTIVITAMIN WITH MINERALS) TABS tablet Take 1 tablet by mouth daily.  Marland Kitchen omeprazole (PRILOSEC) 20 MG capsule Take 20 mg by mouth daily.  . OXYGEN 2lpm 24/7  . polyethylene glycol (MIRALAX / GLYCOLAX) 17 g packet Take 17 g by mouth daily.  . potassium chloride SA (K-DUR,KLOR-CON) 20 MEQ tablet Take 20 mEq by mouth daily.   . predniSONE (DELTASONE) 5 MG tablet Take 5 mg by mouth daily with breakfast.  . senna (SENOKOT) 8.6 MG TABS tablet Take 1 tablet by mouth daily as needed for mild constipation.  . simvastatin (ZOCOR) 20 MG tablet Take 20 mg by mouth at bedtime.   . sucralfate (CARAFATE) 1 g tablet Take 1 g by mouth 2 (two) times daily.   Marland Kitchen telmisartan (MICARDIS) 40 MG tablet Take 1 tablet (40 mg total) by mouth daily.                               Past Medical History:  Diagnosis Date  . Chest pain 04/2011   Normal echocardiogram  . COPD (chronic obstructive pulmonary disease) (HCC)   . Fasting hyperglycemia    Normal hemoglobin A1c of 6.0  . GERD (gastroesophageal reflux disease)   . Hyperlipemia   . Hypertension   . On home O2    2L N/C  . On home O2    at night  . Prediabetes 06/04/2015  . Tobacco abuse          Objective:      06/23/2020        121 03/16/2020      115  11/25/2019        113 10/18/2019        109 04/29/2019      110  11/28/2018        116   10/31/18 112 lb (50.8 kg)  10/03/18 114 lb 6.4 oz (51.9 kg)  09/30/18 116 lb (52.6 kg)      Vital signs reviewed   06/23/2020  - Note at rest 02 sats  97% on 2lpm pulsed  General appearance:    Elderly amb bm > stated age     HEENT : pt wearing mask not removed for exam due to  covid -19 concerns.    NECK :  without JVD/Nodes/TM/ nl carotid upstrokes bilaterally   LUNGS: no acc muscle use,  Mod barrel  contour chest wall with bilateral  Distant bs s audible wheeze and  without cough on insp or exp maneuvers and mod  Hyperresonant  to  percussion bilaterally     CV:  RRR  no s3 or murmur or increase in P2, and no edema   ABD:  soft and nontender with pos mid insp Hoover's  in the supine position. No bruits or organomegaly appreciated, bowel sounds nl  MS:     ext warm without deformities, calf tenderness, cyanosis or clubbing No obvious joint restrictions   SKIN: warm and dry without lesions    NEURO:  alert, approp, nl sensorium with  no motor or cerebellar deficits apparent.            I personally reviewed images and agree with radiology impression as follows:  CXR:   06/11/20  Chronic lung disease with suspected Emphysema. No acute cardiopulmonary abnormality         Assessment

## 2020-06-23 NOTE — Assessment & Plan Note (Signed)
Quit smoking  05/2013 and steroid dep since 2019  - 10/03/2018  After extensive coaching inhaler device,  effectiveness =    0% (can't use them) - d/c all inhalers x for neb 10/03/2018 and d/c ACEi as well  - 10/18/2019  After extensive coaching inhaler device,  effectiveness =    75% with elipta > try trelegy one click each am > improved 11/25/2019  - 03/16/2020  After extensive coaching inhaler device,  effectiveness =    75% with spacer    Group D in terms of symptom/risk and laba/lama/ICS  therefore appropriate rx at this point >>>  Continue trelegy and pred 5 mg floor  The goal with a chronic steroid dependent illness is always arriving at the lowest effective dose that controls the disease/symptoms and not accepting a set "formula" which is based on statistics or guidelines that don't always take into account patient  variability or the natural hx of the dz in every individual patient, which may well vary over time.  For now therefore I recommend the patient maintain  20 mg ceiling and 5 mg floor   Re saba: I spent extra time with pt today reviewing appropriate use of albuterol for prn use on exertion with the following points: 1) saba is for relief of sob that does not improve by walking a slower pace or resting but rather if the pt does not improve after trying this first. 2) If the pt is convinced, as many are, that saba helps recover from activity faster then it's easy to tell if this is the case by re-challenging : ie stop, take the inhaler, then p 5 minutes try the exact same activity (intensity of workload) that just caused the symptoms and see if they are substantially diminished or not after saba 3) if there is an activity that reproducibly causes the symptoms, try the saba 15 min before the activity on alternate days   If in fact the saba really does help, then fine to continue to use it prn but advised may need to look closer at the maintenance regimen being used to achieve better control  of airways disease with exertion.

## 2020-07-01 DIAGNOSIS — J449 Chronic obstructive pulmonary disease, unspecified: Secondary | ICD-10-CM | POA: Diagnosis not present

## 2020-07-01 DIAGNOSIS — J9611 Chronic respiratory failure with hypoxia: Secondary | ICD-10-CM | POA: Diagnosis not present

## 2020-07-05 ENCOUNTER — Emergency Department (HOSPITAL_COMMUNITY): Payer: Medicare Other

## 2020-07-05 ENCOUNTER — Other Ambulatory Visit: Payer: Self-pay

## 2020-07-05 ENCOUNTER — Encounter (HOSPITAL_COMMUNITY): Payer: Self-pay

## 2020-07-05 ENCOUNTER — Inpatient Hospital Stay (HOSPITAL_COMMUNITY)
Admission: EM | Admit: 2020-07-05 | Discharge: 2020-07-07 | DRG: 190 | Disposition: A | Discharge status: Home / Self Care | Payer: Medicare Other | Attending: Internal Medicine | Admitting: Internal Medicine

## 2020-07-05 DIAGNOSIS — Z79899 Other long term (current) drug therapy: Secondary | ICD-10-CM

## 2020-07-05 DIAGNOSIS — J302 Other seasonal allergic rhinitis: Secondary | ICD-10-CM | POA: Diagnosis not present

## 2020-07-05 DIAGNOSIS — Z7952 Long term (current) use of systemic steroids: Secondary | ICD-10-CM

## 2020-07-05 DIAGNOSIS — J471 Bronchiectasis with (acute) exacerbation: Principal | ICD-10-CM | POA: Diagnosis present

## 2020-07-05 DIAGNOSIS — R7303 Prediabetes: Secondary | ICD-10-CM | POA: Diagnosis present

## 2020-07-05 DIAGNOSIS — E785 Hyperlipidemia, unspecified: Secondary | ICD-10-CM | POA: Diagnosis present

## 2020-07-05 DIAGNOSIS — I10 Essential (primary) hypertension: Secondary | ICD-10-CM | POA: Diagnosis present

## 2020-07-05 DIAGNOSIS — Z7984 Long term (current) use of oral hypoglycemic drugs: Secondary | ICD-10-CM

## 2020-07-05 DIAGNOSIS — Z9981 Dependence on supplemental oxygen: Secondary | ICD-10-CM

## 2020-07-05 DIAGNOSIS — J441 Chronic obstructive pulmonary disease with (acute) exacerbation: Principal | ICD-10-CM | POA: Diagnosis present

## 2020-07-05 DIAGNOSIS — E099 Drug or chemical induced diabetes mellitus without complications: Secondary | ICD-10-CM | POA: Diagnosis not present

## 2020-07-05 DIAGNOSIS — Z825 Family history of asthma and other chronic lower respiratory diseases: Secondary | ICD-10-CM

## 2020-07-05 DIAGNOSIS — Z7951 Long term (current) use of inhaled steroids: Secondary | ICD-10-CM | POA: Diagnosis not present

## 2020-07-05 DIAGNOSIS — Z7982 Long term (current) use of aspirin: Secondary | ICD-10-CM

## 2020-07-05 DIAGNOSIS — Z20822 Contact with and (suspected) exposure to covid-19: Secondary | ICD-10-CM | POA: Diagnosis not present

## 2020-07-05 DIAGNOSIS — R0602 Shortness of breath: Secondary | ICD-10-CM | POA: Diagnosis not present

## 2020-07-05 DIAGNOSIS — Z87891 Personal history of nicotine dependence: Secondary | ICD-10-CM

## 2020-07-05 DIAGNOSIS — K219 Gastro-esophageal reflux disease without esophagitis: Secondary | ICD-10-CM | POA: Diagnosis not present

## 2020-07-05 DIAGNOSIS — J439 Emphysema, unspecified: Secondary | ICD-10-CM | POA: Diagnosis not present

## 2020-07-05 DIAGNOSIS — I2510 Atherosclerotic heart disease of native coronary artery without angina pectoris: Secondary | ICD-10-CM | POA: Diagnosis present

## 2020-07-05 DIAGNOSIS — F411 Generalized anxiety disorder: Secondary | ICD-10-CM | POA: Diagnosis present

## 2020-07-05 DIAGNOSIS — R531 Weakness: Secondary | ICD-10-CM | POA: Diagnosis not present

## 2020-07-05 DIAGNOSIS — J9621 Acute and chronic respiratory failure with hypoxia: Secondary | ICD-10-CM | POA: Diagnosis present

## 2020-07-05 DIAGNOSIS — R059 Cough, unspecified: Secondary | ICD-10-CM | POA: Diagnosis not present

## 2020-07-05 LAB — CBC WITH DIFFERENTIAL/PLATELET
Abs Immature Granulocytes: 0.02 10*3/uL (ref 0.00–0.07)
Basophils Absolute: 0 10*3/uL (ref 0.0–0.1)
Basophils Relative: 0 %
Eosinophils Absolute: 0 10*3/uL (ref 0.0–0.5)
Eosinophils Relative: 0 %
HCT: 40.2 % (ref 39.0–52.0)
Hemoglobin: 12.5 g/dL — ABNORMAL LOW (ref 13.0–17.0)
Immature Granulocytes: 0 %
Lymphocytes Relative: 6 %
Lymphs Abs: 0.6 10*3/uL — ABNORMAL LOW (ref 0.7–4.0)
MCH: 28.4 pg (ref 26.0–34.0)
MCHC: 31.1 g/dL (ref 30.0–36.0)
MCV: 91.4 fL (ref 80.0–100.0)
Monocytes Absolute: 0.6 10*3/uL (ref 0.1–1.0)
Monocytes Relative: 7 %
Neutro Abs: 8.2 10*3/uL — ABNORMAL HIGH (ref 1.7–7.7)
Neutrophils Relative %: 87 %
Platelets: 276 10*3/uL (ref 150–400)
RBC: 4.4 MIL/uL (ref 4.22–5.81)
RDW: 16.3 % — ABNORMAL HIGH (ref 11.5–15.5)
WBC: 9.5 10*3/uL (ref 4.0–10.5)
nRBC: 0 % (ref 0.0–0.2)

## 2020-07-05 LAB — BASIC METABOLIC PANEL
Anion gap: 14 (ref 5–15)
BUN: 19 mg/dL (ref 8–23)
CO2: 26 mmol/L (ref 22–32)
Calcium: 9.9 mg/dL (ref 8.9–10.3)
Chloride: 102 mmol/L (ref 98–111)
Creatinine, Ser: 0.83 mg/dL (ref 0.61–1.24)
GFR, Estimated: >60 mL/min (ref >60)
Glucose, Bld: 147 mg/dL — ABNORMAL HIGH (ref 70–99)
Potassium: 3.8 mmol/L (ref 3.5–5.1)
Sodium: 142 mmol/L (ref 135–145)

## 2020-07-05 LAB — RESP PANEL BY RT-PCR (FLU A&B, COVID) ARPGX2
Influenza A by PCR: NEGATIVE
Influenza B by PCR: NEGATIVE
SARS Coronavirus 2 by RT PCR: NEGATIVE

## 2020-07-05 LAB — TROPONIN I (HIGH SENSITIVITY)
Troponin I (High Sensitivity): 3 ng/L (ref <18)
Troponin I (High Sensitivity): 5 ng/L (ref <18)

## 2020-07-05 MED ORDER — AEROCHAMBER PLUS FLO-VU MEDIUM MISC
1.0000 | Freq: Once | Status: DC
  Filled 2020-07-05: qty 1

## 2020-07-05 MED ORDER — METHYLPREDNISOLONE SODIUM SUCC 125 MG IJ SOLR
125.0000 mg | Freq: Once | INTRAMUSCULAR | Status: AC
  Administered 2020-07-05: 125 mg via INTRAVENOUS
  Filled 2020-07-05: qty 2

## 2020-07-05 MED ORDER — IPRATROPIUM-ALBUTEROL 20-100 MCG/ACT IN AERS
2.0000 | INHALATION_SPRAY | Freq: Once | RESPIRATORY_TRACT | Status: AC
  Administered 2020-07-05: 2 via RESPIRATORY_TRACT
  Filled 2020-07-05: qty 4

## 2020-07-05 MED ORDER — IPRATROPIUM-ALBUTEROL 20-100 MCG/ACT IN AERS
2.0000 | INHALATION_SPRAY | Freq: Four times a day (QID) | RESPIRATORY_TRACT | Status: DC
  Administered 2020-07-05 – 2020-07-06 (×5): 2 via RESPIRATORY_TRACT

## 2020-07-05 MED ORDER — MAGNESIUM SULFATE 2 GM/50ML IV SOLN
2.0000 g | Freq: Once | INTRAVENOUS | Status: AC
  Administered 2020-07-05: 2 g via INTRAVENOUS
  Filled 2020-07-05: qty 50

## 2020-07-05 MED ORDER — ALBUTEROL (5 MG/ML) CONTINUOUS INHALATION SOLN
10.0000 mg/h | INHALATION_SOLUTION | Freq: Once | RESPIRATORY_TRACT | Status: AC
  Administered 2020-07-05: 10 mg/h via RESPIRATORY_TRACT
  Filled 2020-07-05: qty 20

## 2020-07-05 MED ORDER — PREDNISONE 50 MG PO TABS: 50.0000 mg | ORAL_TABLET | Freq: Every day | ORAL | 0 refills | Status: DC

## 2020-07-05 NOTE — ED Provider Notes (Signed)
[facility] EMERGENCY DEPARTMENT Provider Note   CSN: [id number] Arrival date & time: 07/05/20  1837     History Chief Complaint  Patient presents with  . Shortness of Breath    AB LEAMING is a 70 y.o. male.  HPI   Pt is a a 70 y/o male with a h/o COPD, hyperglycemia, GAD, GERD, HLD, HTN, on home O2 who presents to the ED today for eval of SOB that started 3 days ago. Reports a chronic cough that seems relatively unchanged. He has had one episode of a productive cough since sxs started. Reports wheezing that seems at baseline. Reports he has had some chest congestion.  States he has been taking prednisone for the last 3 days without relief.  Denies fevers or chest pain. He is vaccinated against covid.   Past Medical History:  Diagnosis Date  . Chest pain 04/2011   Normal echocardiogram  . COPD (chronic obstructive pulmonary disease) (HCC)   . Fasting hyperglycemia    Normal hemoglobin A1c of 6.0  . GAD (generalized anxiety disorder)   . GERD (gastroesophageal reflux disease)   . Hyperlipemia   . Hypertension   . On home O2    2L N/C  . On home O2    at night  . Prediabetes 06/04/2015  . Tobacco abuse     Patient Active Problem List   Diagnosis Date Noted  . Leukocytosis 02/18/2020  . Protein-calorie malnutrition, severe 02/18/2020  . On home O2   . GERD (gastroesophageal reflux disease)   . GAD (generalized anxiety disorder)   . CAP (community acquired pneumonia) 02/17/2020  . Bilateral pneumonia 02/17/2020  . Weight loss 01/28/2019  . COPD  GOLD ?  11/04/2018  . Chronic respiratory failure with hypoxia (HCC) 10/04/2018  . Acute on chronic respiratory failure with hypoxia (HCC) 11/07/2016  . Prediabetes 06/04/2015  . Hyperglycemia, drug-induced 06/03/2015  . Essential hypertension 06/03/2015  . COPD exacerbation (HCC) 06/02/2015  . Hyperlipidemia 07/20/2011  . Fasting hyperglycemia   . Tobacco abuse   . Chest pain 04/02/2011    Past Surgical  History:  Procedure Laterality Date  . APPENDECTOMY    . APPENDECTOMY    . CATARACT EXTRACTION W/PHACO Left 06/10/2019   Procedure: CATARACT EXTRACTION PHACO AND INTRAOCULAR LENS PLACEMENT (IOC);  Surgeon: Fabio Pierce, MD;  Location: [facility];  Service: Ophthalmology;  Laterality: Left;  CDE: 3.73  . CATARACT EXTRACTION W/PHACO Right 06/24/2019   Procedure: CATARACT EXTRACTION PHACO AND INTRAOCULAR LENS PLACEMENT (IOC);  Surgeon: Fabio Pierce, MD;  Location: [facility];  Service: Ophthalmology;  Laterality: Right;  CDE: 4.42  . LEFT HEART CATHETERIZATION WITH CORONARY ANGIOGRAM N/A 05/17/2011   Procedure: LEFT HEART CATHETERIZATION WITH CORONARY ANGIOGRAM;  Surgeon: Tonny Bollman, MD;  Location: [facility] CATH LAB;  Service: Cardiovascular;  Laterality: N/A;       Family History  Problem Relation Age of Onset  . Emphysema Father     Social History   Tobacco Use  . Smoking status: Former Smoker    Packs/day: 1.00    Years: 35.00    Pack years: 35.00    Types: Cigarettes    Quit date: 05/09/2013    Years since quitting: 7.1  . Smokeless tobacco: Never Used  Vaping Use  . Vaping Use: Never used  Substance Use Topics  . Alcohol use: No  . Drug use: No    Home Medications Prior to Admission medications   Medication Sig Start Date End Date Taking? Authorizing Provider  albuterol (PROAIR HFA) 108 (90 Base) MCG/ACT inhaler Inhale 1-2 puffs into the lungs every 4 (four) hours as needed. Patient taking differently: Inhale 1-2 puffs into the lungs every 4 (four) hours as needed for wheezing or shortness of breath. 11/16/18  Yes Nyoka CowdenWert, Khary B, MD  ALPRAZolam Prudy Feeler(XANAX) 0.5 MG tablet Take 0.5 mg by mouth 2 (two) times daily. Takes 1/2 tab in the morning and 1 tablet at bedtime   Yes [provider]  aspirin EC 81 MG tablet Take 81 mg by mouth daily.   Yes [provider]  cefUROXime (CEFTIN) 500 MG tablet Take 500 mg by mouth 2 (two) times daily. 02/03/20  Yes [provider]  chlorthalidone (HYGROTON) 25 MG tablet Take 12.5 mg by mouth daily.   Yes [provider]  famotidine (PEPCID) 40 MG tablet 1 daily after supper Patient taking differently: Take 40 mg by mouth 2 (two) times daily. 10/03/18  Yes Nyoka CowdenWert, Varian B, MD  furosemide (LASIX) 20 MG tablet Take 10 mg by mouth daily. 11/27/19  Yes [provider]  gabapentin (NEURONTIN) 300 MG capsule Take 300 mg by mouth 3 (three) times daily.  03/22/13  Yes [provider]  isosorbide mononitrate (IMDUR) 30 MG 24 hr tablet Take 30 mg by mouth daily. 03/22/14  Yes [provider]  MATZIM LA 240 MG 24 hr tablet Take 240 mg by mouth daily. 04/16/19  Yes [provider]  metFORMIN (GLUCOPHAGE) 500 MG tablet Take 500 mg by mouth daily.   Yes [provider]  mirtazapine (REMERON) 15 MG tablet Take 7.5 mg by mouth at bedtime. 02/12/20  Yes [provider]  Multiple Vitamin (MULTIVITAMIN WITH MINERALS) TABS tablet Take 1 tablet by mouth daily.   Yes [provider]  omeprazole (PRILOSEC) 20 MG capsule Take 20 mg by mouth daily.   Yes [provider]  OXYGEN 2lpm 24/7   Yes [provider]  polyethylene glycol (MIRALAX / GLYCOLAX) 17 g packet Take 17 g by mouth daily.   Yes [provider]  potassium chloride SA (K-DUR,KLOR-CON) 20 MEQ tablet Take 20 mEq by mouth daily.  05/12/14  Yes [provider]  predniSONE (DELTASONE) 50 MG tablet Take 1 tablet (50 mg total) by mouth daily for 5 days. 07/05/20 07/10/20 Yes Luisfelipe Engelstad S, PA-C  senna (SENOKOT) 8.6 MG TABS tablet Take 1 tablet by mouth daily as needed for mild constipation.   Yes [provider]  simvastatin (ZOCOR) 20 MG tablet Take 20 mg by mouth at bedtime.  03/22/14  Yes [provider]  sucralfate (CARAFATE) 1 g tablet Take 1 g by mouth 2 (two) times daily.  07/19/18  Yes [provider]  telmisartan (MICARDIS) 40 MG tablet Take 1 tablet  (40 mg total) by mouth daily. 06/23/20  Yes Nyoka CowdenWert, Deward B, MD  Fluticasone-Umeclidin-Vilant (TRELEGY ELLIPTA) 100-62.5-25 MCG/INH AEPB One click each am Patient not taking: Reported on 07/05/2020 10/18/19   Nyoka CowdenWert, Bennett B, MD    Allergies    Codeine  Review of Systems   Review of Systems  Constitutional: Negative for fever.  HENT: Negative for ear pain and sore throat.   Eyes: Negative for visual disturbance.  Respiratory: Positive for cough, shortness of breath and wheezing.   Cardiovascular: Negative for chest pain and palpitations.  Gastrointestinal: Negative for abdominal pain, constipation, diarrhea, nausea and vomiting.  Genitourinary: Negative for dysuria and hematuria.  Musculoskeletal: Negative for back pain.  Skin: Negative for rash.  Neurological: Negative for headaches.  All other systems reviewed and are negative.   Physical Exam Updated Vital Signs BP (!) 147/86   Pulse 82   Temp 98.7 F (37.1 C) (Oral)   Resp (!) 22   Ht 5\' 6"  (1.676 m)   Wt 52.2 kg   SpO2 98%   BMI 18.56 kg/m   Physical Exam Vitals and nursing note reviewed.  Constitutional:      Appearance: He is well-developed and well-nourished.  HENT:     Head: Normocephalic and atraumatic.  Eyes:     Conjunctiva/sclera: Conjunctivae normal.  Cardiovascular:     Rate and Rhythm: Normal rate and regular rhythm.     Heart sounds: No murmur heard.   Pulmonary:     Effort: Pulmonary effort is normal. No respiratory distress.     Breath sounds: Decreased breath sounds present.  Abdominal:     Palpations: Abdomen is soft.     Tenderness: There is no abdominal tenderness.  Musculoskeletal:        General: No edema.     Cervical back: Neck supple.  Skin:    General: Skin is warm and dry.  Neurological:     Mental Status: He is alert.  Psychiatric:        Mood and Affect: Mood and affect normal.     ED Results / Procedures / Treatments   Labs (all labs ordered are listed, but only  abnormal results are displayed) Labs Reviewed  CBC WITH DIFFERENTIAL/PLATELET - Abnormal; Notable for the following components:      Result Value   Hemoglobin 12.5 (*)    RDW 16.3 (*)    Neutro Abs 8.2 (*)    Lymphs Abs 0.6 (*)    All other components within normal limits  BASIC METABOLIC PANEL - Abnormal; Notable for the following components:   Glucose, Bld 147 (*)    All other components within normal limits  RESP PANEL BY RT-PCR (FLU A&B, COVID) ARPGX2  TROPONIN I (HIGH SENSITIVITY)  TROPONIN I (HIGH SENSITIVITY)    EKG EKG Interpretation  Date/Time:  Sunday July 05 2020 18:55:13 EST Ventricular Rate:  90 PR Interval:    QRS Duration: 94 QT Interval:  332 QTC Calculation: 407 R Axis:   72 Text Interpretation: Sinus rhythm Borderline T wave abnormalities Minimal ST elevation, anterior leads Interpretation limited secondary to artifact Confirmed by [id number] [phone]) on 07/05/2020 7:02:52 PM   Radiology DG Chest Portable 1 View  Result Date: 07/05/2020 CLINICAL DATA:  Cough and shortness of breath.  Weakness. EXAM: PORTABLE CHEST 1 VIEW COMPARISON:  06/11/2020 FINDINGS: Emphysema with chronic hyperinflation and bronchial thickening. Normal heart size and mediastinal contours. No acute airspace disease. No pleural fluid, pneumothorax, or pulmonary edema. No acute osseous abnormalities are seen. IMPRESSION: Emphysema with chronic hyperinflation and bronchial thickening, imaging findings consistent with COPD. No acute abnormality. Electronically Signed   By: 08/09/2020 M.D.   On: 07/05/2020 20:12    Procedures Procedures   CRITICAL CARE Performed by: 09/04/2020   Total critical care time: 35 minutes  Critical care time was exclusive of separately billable procedures and treating other patients.  Critical care was necessary to treat or prevent imminent or life-threatening deterioration.  Critical care was time spent personally by me on the following  activities: development of treatment plan with patient and/or surrogate as well as nursing, discussions with consultants, evaluation of patient's response to treatment, examination of patient, obtaining history from patient or  surrogate, ordering and performing treatments and interventions, ordering and review of laboratory studies, ordering and review of radiographic studies, pulse oximetry and re-evaluation of patient's condition.   Medications Ordered in ED Medications  AeroChamber Plus Flo-Vu Medium MISC 1 each (1 each Other Not Given 07/05/20 2034)  Ipratropium-Albuterol (COMBIVENT) respimat 2 puff (2 puffs Inhalation Given 07/05/20 2056)  albuterol (PROVENTIL,VENTOLIN) solution continuous neb (has no administration in time range)  methylPREDNISolone sodium succinate (SOLU-MEDROL) 125 mg/2 mL injection 125 mg (125 mg Intravenous Given 07/05/20 2104)  magnesium sulfate IVPB 2 g 50 mL (2 g Intravenous New Bag/Given 07/05/20 2118)  Ipratropium-Albuterol (COMBIVENT) respimat 2 puff (2 puffs Inhalation Given 07/05/20 2027)    ED Course  I have reviewed the triage vital signs and the nursing notes.  Pertinent labs & imaging results that were available during my care of the patient were reviewed by me and considered in my medical decision making (see chart for details).    MDM Rules/Calculators/A&P                          Patient here complaining of shortness of breath.  History of COPD on chronic oxygen therapy.  Has been on steroids for 3 days without improvement of symptoms.  Reviewed/interpreted labs CBC at baseline BMP unremarkable Troponin unremarkable  EKG - Sinus rhythm Borderline T wave abnormalities Minimal ST elevation, anterior leads Interpretation limited secondary to artifact  Imaging reviewed/interpreted CXR -  Emphysema with chronic hyperinflation and bronchial thickening, imaging findings consistent with COPD. No acute abnormality.  Patient given Combivent, Solu-Medrol,  magnesium.  On reassessment pt feels some improvement but is still not breathing back to baseline.   At shift change, pt pending reassessment and road test after continuous neb. Care transitioned to Dr. Judd Lien to reassess pt and make dispo decision.   Final Clinical Impression(s) / ED Diagnoses Final diagnoses:  COPD exacerbation (HCC)    Rx / DC Orders ED Discharge Orders         Ordered    predniSONE (DELTASONE) 50 MG tablet  Daily        07/05/20 2250           Karrie Meres, PA-C 07/05/20 2250    Geoffery Lyons, MD 07/06/20 [phone]

## 2020-07-05 NOTE — Discharge Instructions (Signed)
Take prednisone as directed.  Follow up with your pulmonologist next week.   Return to the ER for any new or worsening symptoms.

## 2020-07-05 NOTE — ED Triage Notes (Signed)
Pt to er, pt states that he is always on home O2 at 2L via [place], pt states that he is here for a copd exacerbation, pt is tripodding and has some pursed lip breathing. Pt states that this episode of shortness of breath started about 4 days ago, states that his md gave him some prednisone and an inhaler.  Pt talking in short sentences.

## 2020-07-06 DIAGNOSIS — Z7984 Long term (current) use of oral hypoglycemic drugs: Secondary | ICD-10-CM | POA: Diagnosis not present

## 2020-07-06 DIAGNOSIS — Z20822 Contact with and (suspected) exposure to covid-19: Secondary | ICD-10-CM | POA: Diagnosis present

## 2020-07-06 DIAGNOSIS — J441 Chronic obstructive pulmonary disease with (acute) exacerbation: Secondary | ICD-10-CM | POA: Diagnosis not present

## 2020-07-06 DIAGNOSIS — E785 Hyperlipidemia, unspecified: Secondary | ICD-10-CM | POA: Diagnosis present

## 2020-07-06 DIAGNOSIS — J302 Other seasonal allergic rhinitis: Secondary | ICD-10-CM | POA: Diagnosis present

## 2020-07-06 DIAGNOSIS — Z9981 Dependence on supplemental oxygen: Secondary | ICD-10-CM | POA: Diagnosis not present

## 2020-07-06 DIAGNOSIS — F411 Generalized anxiety disorder: Secondary | ICD-10-CM | POA: Diagnosis present

## 2020-07-06 DIAGNOSIS — Z79899 Other long term (current) drug therapy: Secondary | ICD-10-CM | POA: Diagnosis not present

## 2020-07-06 DIAGNOSIS — I10 Essential (primary) hypertension: Secondary | ICD-10-CM | POA: Diagnosis present

## 2020-07-06 DIAGNOSIS — I2510 Atherosclerotic heart disease of native coronary artery without angina pectoris: Secondary | ICD-10-CM | POA: Diagnosis present

## 2020-07-06 DIAGNOSIS — Z7982 Long term (current) use of aspirin: Secondary | ICD-10-CM | POA: Diagnosis not present

## 2020-07-06 DIAGNOSIS — Z825 Family history of asthma and other chronic lower respiratory diseases: Secondary | ICD-10-CM | POA: Diagnosis not present

## 2020-07-06 DIAGNOSIS — Z7951 Long term (current) use of inhaled steroids: Secondary | ICD-10-CM | POA: Diagnosis not present

## 2020-07-06 DIAGNOSIS — E099 Drug or chemical induced diabetes mellitus without complications: Secondary | ICD-10-CM | POA: Diagnosis present

## 2020-07-06 DIAGNOSIS — Z7952 Long term (current) use of systemic steroids: Secondary | ICD-10-CM | POA: Diagnosis not present

## 2020-07-06 DIAGNOSIS — K219 Gastro-esophageal reflux disease without esophagitis: Secondary | ICD-10-CM | POA: Diagnosis present

## 2020-07-06 DIAGNOSIS — J471 Bronchiectasis with (acute) exacerbation: Secondary | ICD-10-CM | POA: Diagnosis present

## 2020-07-06 DIAGNOSIS — J9621 Acute and chronic respiratory failure with hypoxia: Secondary | ICD-10-CM | POA: Diagnosis not present

## 2020-07-06 DIAGNOSIS — Z87891 Personal history of nicotine dependence: Secondary | ICD-10-CM | POA: Diagnosis not present

## 2020-07-06 LAB — MAGNESIUM: Magnesium: 2.4 mg/dL (ref 1.7–2.4)

## 2020-07-06 LAB — COMPREHENSIVE METABOLIC PANEL
ALT: 17 U/L (ref 0–44)
AST: 24 U/L (ref 15–41)
Albumin: 4.3 g/dL (ref 3.5–5.0)
Alkaline Phosphatase: 63 U/L (ref 38–126)
Anion gap: 13 (ref 5–15)
BUN: 20 mg/dL (ref 8–23)
CO2: 26 mmol/L (ref 22–32)
Calcium: 9 mg/dL (ref 8.9–10.3)
Chloride: 103 mmol/L (ref 98–111)
Creatinine, Ser: 0.73 mg/dL (ref 0.61–1.24)
GFR, Estimated: >60 mL/min (ref >60)
Glucose, Bld: 189 mg/dL — ABNORMAL HIGH (ref 70–99)
Potassium: 3.1 mmol/L — ABNORMAL LOW (ref 3.5–5.1)
Sodium: 142 mmol/L (ref 135–145)
Total Bilirubin: 0.5 mg/dL (ref 0.3–1.2)
Total Protein: 6.9 g/dL (ref 6.5–8.1)

## 2020-07-06 LAB — BLOOD GAS, VENOUS
Acid-Base Excess: 4 mmol/L — ABNORMAL HIGH (ref 0.0–2.0)
Bicarbonate: 27.3 mmol/L (ref 20.0–28.0)
FIO2: 28
O2 Saturation: 91.6 %
Patient temperature: 37
pCO2, Ven: 50.8 mmHg (ref 44.0–60.0)
pH, Ven: 7.373 (ref 7.250–7.430)
pO2, Ven: 67.6 mmHg — ABNORMAL HIGH (ref 32.0–45.0)

## 2020-07-06 LAB — CBC
HCT: 36 % — ABNORMAL LOW (ref 39.0–52.0)
Hemoglobin: 11.3 g/dL — ABNORMAL LOW (ref 13.0–17.0)
MCH: 28.3 pg (ref 26.0–34.0)
MCHC: 31.4 g/dL (ref 30.0–36.0)
MCV: 90.2 fL (ref 80.0–100.0)
Platelets: 227 10*3/uL (ref 150–400)
RBC: 3.99 MIL/uL — ABNORMAL LOW (ref 4.22–5.81)
RDW: 16.1 % — ABNORMAL HIGH (ref 11.5–15.5)
WBC: 12.6 10*3/uL — ABNORMAL HIGH (ref 4.0–10.5)
nRBC: 0 % (ref 0.0–0.2)

## 2020-07-06 LAB — GLUCOSE, CAPILLARY
Glucose-Capillary: 163 mg/dL — ABNORMAL HIGH (ref 70–99)
Glucose-Capillary: 103 mg/dL — ABNORMAL HIGH (ref 70–99)
Glucose-Capillary: 132 mg/dL — ABNORMAL HIGH (ref 70–99)
Glucose-Capillary: 114 mg/dL — ABNORMAL HIGH (ref 70–99)

## 2020-07-06 LAB — HEMOGLOBIN A1C
Hgb A1c MFr Bld: 5.9 % — ABNORMAL HIGH (ref 4.8–5.6)
Mean Plasma Glucose: 122.63 mg/dL

## 2020-07-06 MED ORDER — FLUTICASONE FUROATE-VILANTEROL 100-25 MCG/INH IN AEPB
1.0000 | INHALATION_SPRAY | Freq: Every day | RESPIRATORY_TRACT | Status: DC
  Administered 2020-07-06 – 2020-07-07 (×2): 1 via RESPIRATORY_TRACT
  Filled 2020-07-06: qty 28

## 2020-07-06 MED ORDER — HEPARIN SODIUM (PORCINE) 5000 UNIT/ML IJ SOLN
5000.0000 [IU] | Freq: Three times a day (TID) | INTRAMUSCULAR | Status: DC
  Administered 2020-07-06 – 2020-07-07 (×4): 5000 [IU] via SUBCUTANEOUS
  Filled 2020-07-06 (×4): qty 1

## 2020-07-06 MED ORDER — IPRATROPIUM-ALBUTEROL 20-100 MCG/ACT IN AERS
1.0000 | INHALATION_SPRAY | Freq: Three times a day (TID) | RESPIRATORY_TRACT | Status: DC
  Administered 2020-07-07 (×2): 1 via RESPIRATORY_TRACT

## 2020-07-06 MED ORDER — ALPRAZOLAM 0.25 MG PO TABS
0.2500 mg | ORAL_TABLET | Freq: Two times a day (BID) | ORAL | Status: DC | PRN
  Administered 2020-07-06 (×2): 0.25 mg via ORAL
  Filled 2020-07-06 (×2): qty 1

## 2020-07-06 MED ORDER — INSULIN ASPART 100 UNIT/ML ~~LOC~~ SOLN: 0.0000 [IU] | Freq: Every day | SUBCUTANEOUS | Status: DC

## 2020-07-06 MED ORDER — ATORVASTATIN CALCIUM 10 MG PO TABS
10.0000 mg | ORAL_TABLET | Freq: Every day | ORAL | Status: DC
  Administered 2020-07-06: 10 mg via ORAL
  Filled 2020-07-06: qty 1

## 2020-07-06 MED ORDER — CHLORTHALIDONE 25 MG PO TABS
12.5000 mg | ORAL_TABLET | Freq: Every day | ORAL | Status: DC
  Administered 2020-07-06 – 2020-07-07 (×2): 12.5 mg via ORAL
  Filled 2020-07-06 (×2): qty 1

## 2020-07-06 MED ORDER — ONDANSETRON HCL 4 MG/2ML IJ SOLN: 4.0000 mg | Freq: Four times a day (QID) | INTRAMUSCULAR | Status: DC | PRN

## 2020-07-06 MED ORDER — PANTOPRAZOLE SODIUM 40 MG PO TBEC
40.0000 mg | DELAYED_RELEASE_TABLET | Freq: Every day | ORAL | Status: DC
  Administered 2020-07-06 – 2020-07-07 (×2): 40 mg via ORAL
  Filled 2020-07-06 (×2): qty 1

## 2020-07-06 MED ORDER — SUCRALFATE 1 G PO TABS
1.0000 g | ORAL_TABLET | Freq: Two times a day (BID) | ORAL | Status: DC
  Administered 2020-07-06 – 2020-07-07 (×3): 1 g via ORAL
  Filled 2020-07-06 (×3): qty 1

## 2020-07-06 MED ORDER — METHYLPREDNISOLONE SODIUM SUCC 125 MG IJ SOLR
60.0000 mg | Freq: Four times a day (QID) | INTRAMUSCULAR | Status: AC
  Administered 2020-07-06 (×4): 60 mg via INTRAVENOUS
  Filled 2020-07-06 (×4): qty 2

## 2020-07-06 MED ORDER — SIMVASTATIN 20 MG PO TABS: 20.0000 mg | ORAL_TABLET | Freq: Every day | ORAL | Status: DC

## 2020-07-06 MED ORDER — MIRTAZAPINE 15 MG PO TABS
7.5000 mg | ORAL_TABLET | Freq: Every day | ORAL | Status: DC
  Administered 2020-07-06: 7.5 mg via ORAL
  Filled 2020-07-06: qty 1

## 2020-07-06 MED ORDER — ACETAMINOPHEN 650 MG RE SUPP: 650.0000 mg | Freq: Four times a day (QID) | RECTAL | Status: DC | PRN

## 2020-07-06 MED ORDER — POLYETHYLENE GLYCOL 3350 17 G PO PACK
17.0000 g | PACK | Freq: Every day | ORAL | Status: DC
  Administered 2020-07-06: 17 g via ORAL
  Filled 2020-07-06 (×2): qty 1

## 2020-07-06 MED ORDER — OXYCODONE HCL 5 MG PO TABS: 5.0000 mg | ORAL_TABLET | ORAL | Status: DC | PRN

## 2020-07-06 MED ORDER — INSULIN ASPART 100 UNIT/ML ~~LOC~~ SOLN
0.0000 [IU] | Freq: Three times a day (TID) | SUBCUTANEOUS | Status: DC
  Administered 2020-07-06: 3 [IU] via SUBCUTANEOUS
  Administered 2020-07-06 – 2020-07-07 (×2): 2 [IU] via SUBCUTANEOUS

## 2020-07-06 MED ORDER — ALBUTEROL SULFATE (2.5 MG/3ML) 0.083% IN NEBU: 2.5000 mg | INHALATION_SOLUTION | RESPIRATORY_TRACT | Status: DC | PRN

## 2020-07-06 MED ORDER — GABAPENTIN 300 MG PO CAPS
300.0000 mg | ORAL_CAPSULE | Freq: Three times a day (TID) | ORAL | Status: DC
  Administered 2020-07-06 – 2020-07-07 (×4): 300 mg via ORAL
  Filled 2020-07-06 (×4): qty 1

## 2020-07-06 MED ORDER — ACETAMINOPHEN 325 MG PO TABS: 650.0000 mg | ORAL_TABLET | Freq: Four times a day (QID) | ORAL | Status: DC | PRN

## 2020-07-06 MED ORDER — IRBESARTAN 150 MG PO TABS
150.0000 mg | ORAL_TABLET | Freq: Every day | ORAL | Status: DC
  Administered 2020-07-06 – 2020-07-07 (×2): 150 mg via ORAL
  Filled 2020-07-06 (×2): qty 1

## 2020-07-06 MED ORDER — PREDNISONE 20 MG PO TABS
40.0000 mg | ORAL_TABLET | Freq: Every day | ORAL | Status: DC
  Administered 2020-07-07: 40 mg via ORAL
  Filled 2020-07-06 (×2): qty 2

## 2020-07-06 MED ORDER — FAMOTIDINE 20 MG PO TABS
40.0000 mg | ORAL_TABLET | Freq: Two times a day (BID) | ORAL | Status: DC
  Administered 2020-07-06 – 2020-07-07 (×3): 40 mg via ORAL
  Filled 2020-07-06 (×3): qty 2

## 2020-07-06 MED ORDER — ASPIRIN EC 81 MG PO TBEC
81.0000 mg | DELAYED_RELEASE_TABLET | Freq: Every day | ORAL | Status: DC
  Administered 2020-07-06 – 2020-07-07 (×2): 81 mg via ORAL
  Filled 2020-07-06 (×2): qty 1

## 2020-07-06 MED ORDER — ISOSORBIDE MONONITRATE ER 30 MG PO TB24
30.0000 mg | ORAL_TABLET | Freq: Every day | ORAL | Status: DC
  Administered 2020-07-07: 30 mg via ORAL
  Filled 2020-07-06 (×2): qty 1

## 2020-07-06 MED ORDER — ONDANSETRON HCL 4 MG PO TABS: 4.0000 mg | ORAL_TABLET | Freq: Four times a day (QID) | ORAL | Status: DC | PRN

## 2020-07-06 MED ORDER — FUROSEMIDE 20 MG PO TABS
10.0000 mg | ORAL_TABLET | Freq: Every day | ORAL | Status: DC
  Administered 2020-07-06 – 2020-07-07 (×2): 10 mg via ORAL
  Filled 2020-07-06 (×2): qty 1

## 2020-07-06 MED ORDER — DILTIAZEM HCL ER COATED BEADS 240 MG PO CP24
240.0000 mg | ORAL_CAPSULE | Freq: Every day | ORAL | Status: DC
  Administered 2020-07-06 – 2020-07-07 (×2): 240 mg via ORAL
  Filled 2020-07-06 (×2): qty 1

## 2020-07-06 NOTE — H&P (Signed)
TRH H&P    Patient Demographics:    Jeffery [name], is a 70 y.o. male  MRN: [id number]  DOB - 1950-06-23  Admit Date - 07/05/2020  Referring MD/NP/PA: Judd Lien  Outpatient Primary MD for the patient is Toma Deiters, MD  Patient coming from: Home  Chief complaint- dyspnea   HPI:    Jeffery [name]  is a 70 y.o. male, with history of tobacco abuse-reporting that he has quit, chronic respiratory failure, hypertension, hyperlipidemia, GERD, generalized anxiety disorder, COPD, MR presents the ED with a chief complaint of dyspnea.  Patient reports that the episode started 3 days ago.  He reports that his doctor had told him to stay out of the hospital he needs to first go to his rescue inhaler, if he is not feeling better then to take his nebulizer, and if he is not feeling better then to increase his dose of prednisone to 20 mg daily for 5 days.  Patient reports that he did use his rescue inhaler today and only gave temporary relief, but did not get relief for 4 hours as it is supposed to.  He did not use his nebulizer because he was at church in the morning.  He did increase his prednisone to 20 mg.  Patient reports that the amount of time that he was entered she had uses inhaler 2 or 3 times.  He reports that he did not turn up his oxygen but was feeling short of breath.  Even minimal exertion worsened his shortness of breath, rest improved but did not relieve it.  He has had a dry cough, which is abnormal for him he does not normally have a cough.  He admits to rhinorrhea and postnasal drip consistent with seasonal allergies.  Patient has not had chest pain.  He does report a odd pain that starts in his left foot and goes all the way up through the left side to his left shoulder, but seems to be chronic.  Patient reports that he has had no associated audible rattling and wheezing when he breathes.  Patient has no other  complaints at this time.  Patient reports that he is a former smoker, does not drink, does not use illicit drugs.  He is vaccinated for COVID-and had his booster within the last few days.  He is full code.    Review of systems:    In addition to the HPI above,  No Fever-chills, No Headache, No changes with Vision or hearing, No problems swallowing food or Liquids, No Chest pain, endorses cough and shortness of breath No Abdominal pain, No Nausea or Vomiting, bowel movements are regular, No Blood in stool or Urine, No dysuria, No new skin rashes or bruises, No new joints pains-aches,  No new weakness, tingling, numbness in any extremity, No recent weight gain or loss, No polyuria, polydypsia or polyphagia, No significant Mental Stressors.  All other systems reviewed and are negative.    Past History of the following :    Past Medical History:  Diagnosis Date  . Chest  pain 04/2011   Normal echocardiogram  . COPD (chronic obstructive pulmonary disease) (HCC)   . Fasting hyperglycemia    Normal hemoglobin A1c of 6.0  . GAD (generalized anxiety disorder)   . GERD (gastroesophageal reflux disease)   . Hyperlipemia   . Hypertension   . On home O2    2L N/C  . On home O2    at night  . Prediabetes 06/04/2015  . Tobacco abuse       Past Surgical History:  Procedure Laterality Date  . APPENDECTOMY    . APPENDECTOMY    . CATARACT EXTRACTION W/PHACO Left 06/10/2019   Procedure: CATARACT EXTRACTION PHACO AND INTRAOCULAR LENS PLACEMENT (IOC);  Surgeon: Fabio PierceWrzosek, James, MD;  Location: [facility];  Service: Ophthalmology;  Laterality: Left;  CDE: 3.73  . CATARACT EXTRACTION W/PHACO Right 06/24/2019   Procedure: CATARACT EXTRACTION PHACO AND INTRAOCULAR LENS PLACEMENT (IOC);  Surgeon: Fabio PierceWrzosek, James, MD;  Location: [facility];  Service: Ophthalmology;  Laterality: Right;  CDE: 4.42  . LEFT HEART CATHETERIZATION WITH CORONARY ANGIOGRAM N/A 05/17/2011   Procedure: LEFT HEART CATHETERIZATION  WITH CORONARY ANGIOGRAM;  Surgeon: Tonny BollmanMichael Cooper, MD;  [facility];  Service: Cardiovascular;  Laterality: N/A;      Social History:      Social History   Tobacco Use  . Smoking status: Former Smoker    Packs/day: 1.00    Years: 35.00    Pack years: 35.00    Types: Cigarettes    Quit date: 05/09/2013    Years since quitting: 7.1  . Smokeless tobacco: Never Used  Substance Use Topics  . Alcohol use: No       Family History :     Family History  Problem Relation Age of Onset  . Emphysema Father       Home Medications:   Prior to Admission medications   Medication Sig Start Date End Date Taking? Authorizing Provider  albuterol (PROAIR HFA) 108 (90 Base) MCG/ACT inhaler Inhale 1-2 puffs into the lungs every 4 (four) hours as needed. Patient taking differently: Inhale 1-2 puffs into the lungs every 4 (four) hours as needed for wheezing or shortness of breath. 11/16/18  Yes Nyoka CowdenWert, Theadore B, MD  ALPRAZolam Prudy Feeler(XANAX) 0.5 MG tablet Take 0.5 mg by mouth 2 (two) times daily. Takes 1/2 tab in the morning and 1 tablet at bedtime   Yes [provider]  aspirin EC 81 MG tablet Take 81 mg by mouth daily.   Yes [provider]  cefUROXime (CEFTIN) 500 MG tablet Take 500 mg by mouth 2 (two) times daily. 02/03/20  Yes [provider]  chlorthalidone (HYGROTON) 25 MG tablet Take 12.5 mg by mouth daily.   Yes [provider]  famotidine (PEPCID) 40 MG tablet 1 daily after supper Patient taking differently: Take 40 mg by mouth 2 (two) times daily. 10/03/18  Yes Nyoka CowdenWert, Cane B, MD  furosemide (LASIX) 20 MG tablet Take 10 mg by mouth daily. 11/27/19  Yes [provider]  gabapentin (NEURONTIN) 300 MG capsule Take 300 mg by mouth 3 (three) times daily.  03/22/13  Yes [provider]  isosorbide mononitrate (IMDUR) 30 MG 24 hr tablet Take 30 mg by mouth daily. 03/22/14  Yes [provider]  MATZIM LA 240 MG 24 hr tablet Take 240  mg by mouth daily. 04/16/19  Yes [provider]  metFORMIN (GLUCOPHAGE) 500 MG tablet Take 500 mg by mouth daily.   Yes [provider]  mirtazapine (  REMERON) 15 MG tablet Take 7.5 mg by mouth at bedtime. 02/12/20  Yes [provider]  Multiple Vitamin (MULTIVITAMIN WITH MINERALS) TABS tablet Take 1 tablet by mouth daily.   Yes [provider]  omeprazole (PRILOSEC) 20 MG capsule Take 20 mg by mouth daily.   Yes [provider]  OXYGEN 2lpm 24/7   Yes [provider]  polyethylene glycol (MIRALAX / GLYCOLAX) 17 g packet Take 17 g by mouth daily.   Yes [provider]  potassium chloride SA (K-DUR,KLOR-CON) 20 MEQ tablet Take 20 mEq by mouth daily.  05/12/14  Yes [provider]  predniSONE (DELTASONE) 50 MG tablet Take 1 tablet (50 mg total) by mouth daily for 5 days. 07/05/20 07/10/20 Yes Couture, Cortni S, PA-C  senna (SENOKOT) 8.6 MG TABS tablet Take 1 tablet by mouth daily as needed for mild constipation.   Yes [provider]  simvastatin (ZOCOR) 20 MG tablet Take 20 mg by mouth at bedtime.  03/22/14  Yes [provider]  sucralfate (CARAFATE) 1 g tablet Take 1 g by mouth 2 (two) times daily.  07/19/18  Yes [provider]  telmisartan (MICARDIS) 40 MG tablet Take 1 tablet (40 mg total) by mouth daily. 06/23/20  Yes Nyoka Cowden, MD  Fluticasone-Umeclidin-Vilant (TRELEGY ELLIPTA) 100-62.5-25 MCG/INH AEPB One click each am Patient not taking: Reported on 07/05/2020 10/18/19   Nyoka Cowden, MD     Allergies:     Allergies  Allergen Reactions  . Codeine Nausea Only     Physical Exam:   Vitals  Blood pressure (!) 163/88, pulse (!) 106, temperature 98.3 F (36.8 C), temperature source Oral, resp. rate 16, height 5\' 6"  (1.676 m), weight 52.2 kg, SpO2 97 %.  1.  General: Patient lying supine in bed, very talkative, no acute distress  2. Psychiatric: Mood and behavior normal for  situation, alert and oriented x3, cooperative with exam  3. Neurologic: Face is symmetric, speech and language are normal, moves all 4 extremities voluntarily, alert and oriented x3, no focal deficit on limited exam  4. HEENMT:  Head is atraumatic, normocephalic, pupils reactive to light, neck is supple, trachea is midline, mucous membranes are moist, dentition is poor  5. Respiratory : Lungs with expiratory wheezing bilaterally, no rhonchi, no crackles, no cyanosis, no tachypnea, maintaining oxygen saturations on 3 L nasal cannula  6. Cardiovascular : Heart rate is normal, rhythm is regular, no murmurs rubs or gallops, no peripheral edema, peripheral pulses palpated  7. Gastrointestinal:  Abdomen is soft, nondistended, nontender to palpation, no masses palpated, bowel sounds active  8. Skin:  Skin is warm dry and intact without acute lesion on limited exam  9.Musculoskeletal:  No acute deformity, no calf tenderness, no peripheral edema    Data Review:    CBC Recent Labs  Lab 07/05/20 1915 07/06/20 0403  WBC 9.5 12.6*  HGB 12.5* 11.3*  HCT 40.2 36.0*  PLT 276 227  MCV 91.4 90.2  MCH 28.4 28.3  MCHC 31.1 31.4  RDW 16.3* 16.1*  LYMPHSABS 0.6*  --   MONOABS 0.6  --   EOSABS 0.0  --   BASOSABS 0.0  --    ------------------------------------------------------------------------------------------------------------------  Results for orders placed or performed during the hospital encounter of 07/05/20 (from the past 48 hour(s))  CBC with Differential     Status: Abnormal   Collection Time: 07/05/20  7:15 PM  Result Value Ref Range   WBC 9.5 4.0 - 10.5 K/uL  RBC 4.40 4.22 - 5.81 MIL/uL   Hemoglobin 12.5 (L) 13.0 - 17.0 g/dL   HCT 07.3 71.0 - 62.6 %   MCV 91.4 80.0 - 100.0 fL   MCH 28.4 26.0 - 34.0 pg   MCHC 31.1 30.0 - 36.0 g/dL   RDW 94.8 (H) 54.6 - 27.0 %   Platelets 276 150 - 400 K/uL   nRBC 0.0 0.0 - 0.2 %   Neutrophils Relative % 87 %   Neutro Abs 8.2 (H) 1.7  - 7.7 K/uL   Lymphocytes Relative 6 %   Lymphs Abs 0.6 (L) 0.7 - 4.0 K/uL   Monocytes Relative 7 %   Monocytes Absolute 0.6 0.1 - 1.0 K/uL   Eosinophils Relative 0 %   Eosinophils Absolute 0.0 0.0 - 0.5 K/uL   Basophils Relative 0 %   Basophils Absolute 0.0 0.0 - 0.1 K/uL   Immature Granulocytes 0 %   Abs Immature Granulocytes 0.02 0.00 - 0.07 K/uL    Comment: Performed at [facility], [address], [place], [place] [address]  Basic metabolic panel     Status: Abnormal   Collection Time: 07/05/20  7:15 PM  Result Value Ref Range   Sodium 142 135 - 145 mmol/L   Potassium 3.8 3.5 - 5.1 mmol/L   Chloride 102 98 - 111 mmol/L   CO2 26 22 - 32 mmol/L   Glucose, Bld 147 (H) 70 - 99 mg/dL    Comment: Glucose reference range applies only to samples taken after fasting for at least 8 hours.   BUN 19 8 - 23 mg/dL   Creatinine, Ser 3.81 0.61 - 1.24 mg/dL   Calcium 9.9 8.9 - 82.9 mg/dL   GFR, Estimated >93 >71 mL/min    Comment: (NOTE) Calculated using the CKD-EPI Creatinine Equation (2021)    Anion gap 14 5 - 15    Comment: Performed at [facility], [address], [place], [place] [address]  Troponin I (High Sensitivity)     Status: None   Collection Time: 07/05/20  7:15 PM  Result Value Ref Range   Troponin I (High Sensitivity) 3 <18 ng/L    Comment: (NOTE) Elevated high sensitivity troponin I (hsTnI) values and significant  changes across serial measurements may suggest ACS but many other  chronic and acute conditions are known to elevate hsTnI results.  Refer to the "Links" section for chest pain algorithms and additional  guidance. Performed at [facility], [address], [place], [place] [address]   Troponin I (High Sensitivity)     Status: None   Collection Time: 07/05/20  9:38 PM  Result Value Ref Range   Troponin I (High Sensitivity) 5 <18 ng/L    Comment: (NOTE) Elevated high sensitivity troponin I (hsTnI) values and significant  changes across serial measurements may  suggest ACS but many other  chronic and acute conditions are known to elevate hsTnI results.  Refer to the "Links" section for chest pain algorithms and additional  guidance. Performed at [facility], [address] [place], [place] [address]   Resp Panel by RT-PCR (Flu A&B, Covid) Nasopharyngeal Swab     Status: None   Collection Time: 07/05/20  9:40 PM   Specimen: Nasopharyngeal Swab; Nasopharyngeal(NP) swabs in vial transport medium  Result Value Ref Range   SARS Coronavirus 2 by RT PCR NEGATIVE NEGATIVE    Comment: (NOTE) SARS-CoV-2 target nucleic acids are NOT DETECTED.  The SARS-CoV-2 RNA is generally detectable in upper respiratory specimens during the  acute phase of infection. The lowest concentration of SARS-CoV-2 viral copies this assay can detect is 138 copies/mL. A negative result does not preclude SARS-Cov-2 infection and should not be used as the sole basis for treatment or other patient management decisions. A negative result may occur with  improper specimen collection/handling, submission of specimen other than nasopharyngeal swab, presence of viral mutation(s) within the areas targeted by this assay, and inadequate number of viral copies(<138 copies/mL). A negative result must be combined with clinical observations, patient history, and epidemiological information. The expected result is Negative.  Fact Sheet for Patients:  BloggerCourse.com  Fact Sheet for Healthcare Providers:  SeriousBroker.it  This test is no t yet approved or cleared by the [place] FDA and  has been authorized for detection and/or diagnosis of SARS-CoV-2 by FDA under an Emergency Use Authorization (EUA). This EUA will remain  in effect (meaning this test can be used) for the duration of the COVID-19 declaration under Section 564(b)(1) of the Act, 21 U.S.C.section 360bbb-3(b)(1), unless the authorization is terminated  or revoked  sooner.       Influenza A by PCR NEGATIVE NEGATIVE   Influenza B by PCR NEGATIVE NEGATIVE    Comment: (NOTE) The Xpert Xpress SARS-CoV-2/FLU/RSV plus assay is intended as an aid in the diagnosis of influenza from Nasopharyngeal swab specimens and should not be used as a sole basis for treatment. Nasal washings and aspirates are unacceptable for Xpert Xpress SARS-CoV-2/FLU/RSV testing.  Fact Sheet for Patients: BloggerCourse.com  Fact Sheet for Healthcare Providers: SeriousBroker.it  This test is not yet approved or cleared by the [place] FDA and has been authorized for detection and/or diagnosis of SARS-CoV-2 by FDA under an Emergency Use Authorization (EUA). This EUA will remain in effect (meaning this test can be used) for the duration of the COVID-19 declaration under Section 564(b)(1) of the Act, 21 U.S.C. section 360bbb-3(b)(1), unless the authorization is terminated or revoked.  Performed at [facility], [address] [place], [place] [address]   CBC     Status: Abnormal   Collection Time: 07/06/20  4:03 AM  Result Value Ref Range   WBC 12.6 (H) 4.0 - 10.5 K/uL   RBC 3.99 (L) 4.22 - 5.81 MIL/uL   Hemoglobin 11.3 (L) 13.0 - 17.0 g/dL   HCT 27.7 (L) 41.2 - 87.8 %   MCV 90.2 80.0 - 100.0 fL   MCH 28.3 26.0 - 34.0 pg   MCHC 31.4 30.0 - 36.0 g/dL   RDW 67.6 (H) 72.0 - 94.7 %   Platelets 227 150 - 400 K/uL   nRBC 0.0 0.0 - 0.2 %    Comment: Performed at [facility], [address], [place], [place] [address]  Blood gas, venous     Status: Abnormal   Collection Time: 07/06/20  4:21 AM  Result Value Ref Range   FIO2 28.00    pH, Ven 7.373 7.250 - 7.430   pCO2, Ven 50.8 44.0 - 60.0 mmHg   pO2, Ven 67.6 (H) 32.0 - 45.0 mmHg   Bicarbonate 27.3 20.0 - 28.0 mmol/L   Acid-Base Excess 4.0 (H) 0.0 - 2.0 mmol/L   O2 Saturation 91.6 %   Patient temperature 37.0     Comment: Performed at [facility], [address] [place], [place] [address]    Chemistries  Recent Labs  Lab 07/05/20 1915  NA 142  K 3.8  CL 102  CO2 26  GLUCOSE 147*  BUN 19  CREATININE 0.83  CALCIUM 9.9   ------------------------------------------------------------------------------------------------------------------  ------------------------------------------------------------------------------------------------------------------ GFR: Estimated Creatinine Clearance: 63.8 mL/min (by C-G formula based on SCr of 0.83 mg/dL). Liver Function Tests: No results for input(s): AST, ALT, ALKPHOS, BILITOT, PROT, ALBUMIN in the last 168 hours. No results for input(s): LIPASE, AMYLASE in the last 168 hours. No results for input(s): AMMONIA in the last 168 hours. Coagulation Profile: No results for input(s): INR, PROTIME in the last 168 hours. Cardiac Enzymes: No results for input(s): CKTOTAL, CKMB, CKMBINDEX, TROPONINI in the last 168 hours. BNP (last 3 results) No results for input(s): PROBNP in the last 8760 hours. HbA1C: No results for input(s): HGBA1C in the last 72 hours. CBG: No results for input(s): GLUCAP in the last 168 hours. Lipid Profile: No results for input(s): CHOL, HDL, LDLCALC, TRIG, CHOLHDL, LDLDIRECT in the last 72 hours. Thyroid Function Tests: No results for input(s): TSH, T4TOTAL, FREET4, T3FREE, THYROIDAB in the last 72 hours. Anemia Panel: No results for input(s): VITAMINB12, FOLATE, FERRITIN, TIBC, IRON, RETICCTPCT in the last 72 hours.  --------------------------------------------------------------------------------------------------------------- Urine analysis:    Component Value Date/Time   COLORURINE YELLOW 06/20/2018 1202   APPEARANCEUR CLEAR 06/20/2018 1202   LABSPEC 1.008 06/20/2018 1202   PHURINE 8.0 06/20/2018 1202   GLUCOSEU NEGATIVE 06/20/2018 1202   HGBUR NEGATIVE 06/20/2018 1202   BILIRUBINUR NEGATIVE 06/20/2018 1202   KETONESUR NEGATIVE 06/20/2018 1202   PROTEINUR NEGATIVE  06/20/2018 1202   UROBILINOGEN 0.2 04/12/2011 1548   NITRITE NEGATIVE 06/20/2018 1202   LEUKOCYTESUR NEGATIVE 06/20/2018 1202      Imaging Results:    DG Chest Portable 1 View  Result Date: 07/05/2020 CLINICAL DATA:  Cough and shortness of breath.  Weakness. EXAM: PORTABLE CHEST 1 VIEW COMPARISON:  06/11/2020 FINDINGS: Emphysema with chronic hyperinflation and bronchial thickening. Normal heart size and mediastinal contours. No acute airspace disease. No pleural fluid, pneumothorax, or pulmonary edema. No acute osseous abnormalities are seen. IMPRESSION: Emphysema with chronic hyperinflation and bronchial thickening, imaging findings consistent with COPD. No acute abnormality. Electronically Signed   By: Narda Rutherford M.D.   On: 07/05/2020 20:12    My personal review of EKG: Rhythm NSR, Rate 90 /min, QTc 407 ,no Acute ST changes   Assessment & Plan:    Active Problems:   COPD exacerbation (HCC)   1. Acute on chronic respiratory failure 1. Patient wears 2 L nasal cannula 24/7 at home 2. Today requiring 3 L nasal cannula 3. Secondary to COPD exacerbation 4. Wean off O2 as tolerated 5. Continue treatment below 6. Continue to monitor 2. COPD exacerbation 1. Continue albuterol neb every 2 hours as needed 2. Continue Combivent 2 puffs every 6 hours scheduled 3. Tinea Solu-Medrol 4. Continue to monitor 5. Likely to discharge in 24 to 48 hours 3. Hypertension 1. Continue chlorthalidone, diltiazem, Lasix, ARB 4. Coronary artery disease 1. Continue Imdur and simvastatin 5. Prediabetes 1. Hold Metformin 2. Sliding scale coverage 3. Carb modified diet 6.    DVT Prophylaxis-   Heparin- SCDs   AM Labs Ordered, also please review Full Orders  Family Communication: No family at bedside Code Status: Full  Admission status: Observation  Time spent in minutes : 65   Asia B Zierle-Ghosh DO

## 2020-07-06 NOTE — Progress Notes (Signed)
Patient seen and examined.  Admitted after midnight secondary to worsening shortness of breath, wheezing and DOE. Patient reproted home bronchodilators were non effective controlling his symptoms and came to the hospital for further evaluation and management. Patient is not longer smoking and is neg for COVID. Please refer to H&P written by Dr. Carren Rang for further info/details on admission.  Plan: -continue IV steroids -continue bronchodilators,  -continue oxygen supplementation and start flutter valve.  Vassie Loll MD [phone]

## 2020-07-06 NOTE — ED Notes (Signed)
Called report to [employer] . Pt to go to med surg floor

## 2020-07-06 NOTE — ED Notes (Signed)
Pt ambulated around nsg station sat 94% HR 122.

## 2020-07-07 DIAGNOSIS — J9621 Acute and chronic respiratory failure with hypoxia: Secondary | ICD-10-CM

## 2020-07-07 DIAGNOSIS — I10 Essential (primary) hypertension: Secondary | ICD-10-CM

## 2020-07-07 LAB — GLUCOSE, CAPILLARY
Glucose-Capillary: 142 mg/dL — ABNORMAL HIGH (ref 70–99)
Glucose-Capillary: 101 mg/dL — ABNORMAL HIGH (ref 70–99)

## 2020-07-07 MED ORDER — PREDNISONE 20 MG PO TABS: ORAL_TABLET | ORAL | 0 refills | Status: DC

## 2020-07-07 MED ORDER — CEFDINIR 300 MG PO CAPS: 300.0000 mg | ORAL_CAPSULE | Freq: Two times a day (BID) | ORAL | 0 refills | Status: AC

## 2020-07-07 MED ORDER — ATORVASTATIN CALCIUM 10 MG PO TABS
10.0000 mg | ORAL_TABLET | Freq: Every day | ORAL | 1 refills | Status: AC
Start: 2020-07-07 — End: ?

## 2020-07-07 MED ORDER — PREDNISONE 5 MG PO TABS: 5.0000 mg | ORAL_TABLET | Freq: Every day | ORAL | Status: AC

## 2020-07-07 NOTE — Progress Notes (Signed)
IV removed and DC intructions reviewed with wife.  Scripts sent to pharmacy.  Transported by [employer] entrance.  Wife to drive home

## 2020-07-07 NOTE — Discharge Summary (Signed)
Physician Discharge Summary  Jeffery [name] ZOX:[id number] DOB: [birth year] DOA: 07/05/2020  PCP: Jeffery DeitersHasanaj, Jeffery A, MD  Admit date: 07/05/2020 Discharge date: 07/07/2020  Time spent: 35 minutes  Recommendations for Outpatient Follow-up:  1. Repeat basic metabolic panel to evaluate lites and renal function 2. Reassess complete resolution of patient's exacerbation symptoms.   Discharge Diagnoses:  Principal Problem:   COPD exacerbation (HCC) Active Problems:   Essential hypertension   Prediabetes   Acute on chronic respiratory failure with hypoxia (HCC)   COPD with acute exacerbation (HCC)   Discharge Condition: Stable and improved.  Discharged home with instruction to follow-up with PCP and pulmonologist as an outpatient.  CODE STATUS: Full code.  Diet recommendation: Heart healthy modified carbohydrate diet.  Filed Weights   07/05/20 1854 07/06/20 0619  Weight: 52.2 kg 49.7 kg    History of present illness:  As per H&P written by Dr. Carren RangZierle-Ghosh on 07/06/20 Jeffery RamaMichael [name]  is a 70 y.o. male, with history of tobacco abuse-reporting that he has quit, chronic respiratory failure, hypertension, hyperlipidemia, GERD, generalized anxiety disorder, COPD, MR presents the ED with a chief complaint of dyspnea.  Patient reports that the episode started 3 days ago.  He reports that his doctor had told him to stay out of the hospital he needs to first go to his rescue inhaler, if he is not feeling better then to take his nebulizer, and if he is not feeling better then to increase his dose of prednisone to 20 mg daily for 5 days.  Patient reports that he did use his rescue inhaler today and only gave temporary relief, but did not get relief for 4 hours as it is supposed to.  He did not use his nebulizer because he was at church in the morning.  He did increase his prednisone to 20 mg.  Patient reports that the amount of time that he was entered she had uses inhaler 2 or 3 times.  He reports that he  did not turn up his oxygen but was feeling short of breath.  Even minimal exertion worsened his shortness of breath, rest improved but did not relieve it.  He has had a dry cough, which is abnormal for him he does not normally have a cough.  He admits to rhinorrhea and postnasal drip consistent with seasonal allergies.  Patient has not had chest pain.  He does report a odd pain that starts in his left foot and goes all the way up through the left side to his left shoulder, but seems to be chronic.  Patient reports that he has had no associated audible rattling and wheezing when he breathes.  Patient has no other complaints at this time.  Patient reports that he is a former smoker, does not drink, does not use illicit drugs.  He is vaccinated for COVID-and had his booster within the last few days.  He is full code.  Hospital Course:  1-acute on chronic respiratory failure with hypoxia due to COPD exacerbation and bronchiectasis -Patient chronically uses 2 L of oxygen; presented requiring 3 L to maintain saturations -Significant improvement to treatment with nebulizer, IV steroids and antibiotics -Discharge on the steroids tapering and instruction to resume home maintenance/rescue bronchodilator regimen. -Outpatient follow-up with pulmonologist -Continue oxygen supplementation (at time of discharge back to 2 L nasal cannula supplementation).  2-hypertension -Stable and well-controlled -Continue the use of chlorthalidone, diltiazem, Lasix and Cozaar.  3-history of coronary artery disease -Continue Imdur and simvastatin  4-prediabetes/type II with  steroid-induced diabetes -Resume home oral hypoglycemic agents -Instructed to follow modified carbohydrate diet and to maintain adequate hydration -Anticipating elevation in his CBGs with higher dose of his steroids.  5-gastroesophageal reflux disease -Continue PPI and lifestyle modifications.  6-hyperlipidemia -Continue statin.  *The rest of his  medical problems has remained stable and at time of discharge plan is to continue with prior to admission medical management.  Outpatient follow-up with PCP in about 10 days has been instructed to further adjust therapy as required.  Procedures:  See below for x-ray reports.  Consultations:  None  Discharge Exam: Vitals:   07/07/20 0905 07/07/20 1416  BP: (!) 183/96   Pulse: 90   Resp:    Temp:    SpO2:  91%    General: No chest pain, no nausea, no vomiting.  Speaking in full sentences.  Back to his baseline chronic oxygen supplementation with good saturation.  Reporting some intermittent mild productive coughing spells. Cardiovascular: S1-S2, no rubs, no gallops, no JVD. Respiratory: Mild expiratory wheezing; positive rhonchi.  No using accessory muscle.  Improved air movement bilaterally. Abdomen: Soft, nontender, nondistended, positive bowel sounds Extremities: No cyanosis, no clubbing, no edema.  Discharge Instructions   Discharge Instructions    Diet - low sodium heart healthy   Complete by: As directed    Discharge instructions   Complete by: As directed    Take medications as prescribed Maintain adequate hydration Arrange follow-up with PCP in 10 days Follow-up with pulmonologist as previously instructed After completing steroids tapering; resume daily prednisone 5 mg as previously prescribed. Follow heart healthy modified carbohydrate diet.     Allergies as of 07/07/2020      Reactions   Codeine Nausea Only      Medication List    STOP taking these medications   cefUROXime 500 MG tablet Commonly known as: CEFTIN     TAKE these medications   albuterol 108 (90 Base) MCG/ACT inhaler Commonly known as: ProAir HFA Inhale 1-2 puffs into the lungs every 4 (four) hours as needed. What changed: reasons to take this   ALPRAZolam 0.5 MG tablet Commonly known as: XANAX Take 0.5 mg by mouth 2 (two) times daily. Takes 1/2 tab in the morning and 1 tablet at  bedtime   aspirin EC 81 MG tablet Take 81 mg by mouth daily.   atorvastatin 10 MG tablet Commonly known as: LIPITOR Take 1 tablet (10 mg total) by mouth at bedtime.   cefdinir 300 MG capsule Commonly known as: OMNICEF Take 1 capsule (300 mg total) by mouth 2 (two) times daily for 5 days.   chlorthalidone 25 MG tablet Commonly known as: HYGROTON Take 12.5 mg by mouth daily.   famotidine 40 MG tablet Commonly known as: PEPCID 1 daily after supper What changed:   how much to take  how to take this  when to take this  additional instructions   furosemide 20 MG tablet Commonly known as: LASIX Take 10 mg by mouth daily.   gabapentin 300 MG capsule Commonly known as: NEURONTIN Take 300 mg by mouth 3 (three) times daily.   isosorbide mononitrate 30 MG 24 hr tablet Commonly known as: IMDUR Take 30 mg by mouth daily.   Matzim LA 240 MG 24 hr tablet Generic drug: diltiazem Take 240 mg by mouth daily.   metFORMIN 500 MG tablet Commonly known as: GLUCOPHAGE Take 500 mg by mouth daily.   mirtazapine 15 MG tablet Commonly known as: REMERON Take 7.5 mg by mouth  at bedtime.   multivitamin with minerals Tabs tablet Take 1 tablet by mouth daily.   omeprazole 20 MG capsule Commonly known as: PRILOSEC Take 20 mg by mouth daily.   OXYGEN 2lpm 24/7   polyethylene glycol 17 g packet Commonly known as: MIRALAX / GLYCOLAX Take 17 g by mouth daily.   potassium chloride SA 20 MEQ tablet Commonly known as: KLOR-CON Take 20 mEq by mouth daily.   predniSONE 20 MG tablet Commonly known as: DELTASONE Take 3 tablets by mouth daily x1 day; then 2 tablet by mouth daily x2 days; then 1 tablet by mouth daily x3 days; then half tablet by mouth daily x3 days and then resume daily 5 mg tablet after that. What changed:   medication strength  additional instructions   predniSONE 5 MG tablet Commonly known as: DELTASONE Take 1 tablet (5 mg total) by mouth daily with breakfast.  Start taking it again after completion of acute tapering prescription. What changed: You were already taking a medication with the same name, and this prescription was added. Make sure you understand how and when to take each.   senna 8.6 MG Tabs tablet Commonly known as: SENOKOT Take 1 tablet by mouth daily as needed for mild constipation.   simvastatin 20 MG tablet Commonly known as: ZOCOR Take 20 mg by mouth at bedtime.   sucralfate 1 g tablet Commonly known as: CARAFATE Take 1 g by mouth 2 (two) times daily.   telmisartan 40 MG tablet Commonly known as: MICARDIS Take 1 tablet (40 mg total) by mouth daily.   Trelegy Ellipta 100-62.5-25 MCG/INH Aepb Generic drug: Fluticasone-Umeclidin-Vilant One click each am      Allergies  Allergen Reactions  . Codeine Nausea Only    Follow-up Information    [facility] EMERGENCY DEPARTMENT.   Specialty: Emergency Medicine Why: Return to the ER with any new or worsening symptoms Contact information: [address] [id number] [place] [address] [phone]       Jeffery Deiters, MD. Schedule an appointment as soon as possible for a visit in 10 day(s).   Specialty: Internal Medicine Contact information: [address] [place] [address] [phone]               The results of significant diagnostics from this hospitalization (including imaging, microbiology, ancillary and laboratory) are listed below for reference.    Significant Diagnostic Studies: DG Chest Portable 1 View  Result Date: 07/05/2020 CLINICAL DATA:  Cough and shortness of breath.  Weakness. EXAM: PORTABLE CHEST 1 VIEW COMPARISON:  06/11/2020 FINDINGS: Emphysema with chronic hyperinflation and bronchial thickening. Normal heart size and mediastinal contours. No acute airspace disease. No pleural fluid, pneumothorax, or pulmonary edema. No acute osseous abnormalities are seen. IMPRESSION: Emphysema with chronic hyperinflation and  bronchial thickening, imaging findings consistent with COPD. No acute abnormality. Electronically Signed   By: Narda Rutherford M.D.   On: 07/05/2020 20:12   DG Chest Portable 1 View  Result Date: 06/11/2020 CLINICAL DATA:  70 year old male with shortness of breath. Bilateral pneumonia November. EXAM: PORTABLE CHEST 1 VIEW COMPARISON:  Chest radiographs 05/20/2020 and earlier. FINDINGS: Portable AP upright view at 1051 hours. Chronic pulmonary hyperinflation. Attenuation of bronchial vascular markings also with evidence of emphysema on CT Abdomen and Pelvis last year. Mediastinal contours remain within normal limits. Visualized tracheal air column is within normal limits. EKG button artifact in both lungs. No pneumothorax or acute pulmonary opacity. Negative visible bowel gas. No acute osseous  abnormality identified. IMPRESSION: Chronic lung disease with suspected Emphysema. No acute cardiopulmonary abnormality. Electronically Signed   By: Odessa Fleming M.D.   On: 06/11/2020 11:04    Microbiology: Recent Results (from the past 240 hour(s))  Resp Panel by RT-PCR (Flu A&B, Covid) Nasopharyngeal Swab     Status: None   Collection Time: 07/05/20  9:40 PM   Specimen: Nasopharyngeal Swab; Nasopharyngeal(NP) swabs in vial transport medium  Result Value Ref Range Status   SARS Coronavirus 2 by RT PCR NEGATIVE NEGATIVE Final    Comment: (NOTE) SARS-CoV-2 target nucleic acids are NOT DETECTED.  The SARS-CoV-2 RNA is generally detectable in upper respiratory specimens during the acute phase of infection. The lowest concentration of SARS-CoV-2 viral copies this assay can detect is 138 copies/mL. A negative result does not preclude SARS-Cov-2 infection and should not be used as the sole basis for treatment or other patient management decisions. A negative result may occur with  improper specimen collection/handling, submission of specimen other than nasopharyngeal swab, presence of viral mutation(s) within  the areas targeted by this assay, and inadequate number of viral copies(<138 copies/mL). A negative result must be combined with clinical observations, patient history, and epidemiological information. The expected result is Negative.  Fact Sheet for Patients:  BloggerCourse.com  Fact Sheet for Healthcare Providers:  SeriousBroker.it  This test is no t yet approved or cleared by the [place] FDA and  has been authorized for detection and/or diagnosis of SARS-CoV-2 by FDA under an Emergency Use Authorization (EUA). This EUA will remain  in effect (meaning this test can be used) for the duration of the COVID-19 declaration under Section 564(b)(1) of the Act, 21 U.S.C.section 360bbb-3(b)(1), unless the authorization is terminated  or revoked sooner.       Influenza A by PCR NEGATIVE NEGATIVE Final   Influenza B by PCR NEGATIVE NEGATIVE Final    Comment: (NOTE) The Xpert Xpress SARS-CoV-2/FLU/RSV plus assay is intended as an aid in the diagnosis of influenza from Nasopharyngeal swab specimens and should not be used as a sole basis for treatment. Nasal washings and aspirates are unacceptable for Xpert Xpress SARS-CoV-2/FLU/RSV testing.  Fact Sheet for Patients: BloggerCourse.com  Fact Sheet for Healthcare Providers: SeriousBroker.it  This test is not yet approved or cleared by the [place] FDA and has been authorized for detection and/or diagnosis of SARS-CoV-2 by FDA under an Emergency Use Authorization (EUA). This EUA will remain in effect (meaning this test can be used) for the duration of the COVID-19 declaration under Section 564(b)(1) of the Act, 21 U.S.C. section 360bbb-3(b)(1), unless the authorization is terminated or revoked.  Performed at [facility], [address], [place], [place] [address]      Labs: Basic Metabolic Panel: Recent Labs  Lab  07/05/20 1915 07/06/20 0403  NA 142 142  K 3.8 3.1*  CL 102 103  CO2 26 26  GLUCOSE 147* 189*  BUN 19 20  CREATININE 0.83 0.73  CALCIUM 9.9 9.0  MG  --  2.4   Liver Function Tests: Recent Labs  Lab 07/06/20 0403  AST 24  ALT 17  ALKPHOS 63  BILITOT 0.5  PROT 6.9  ALBUMIN 4.3   CBC: Recent Labs  Lab 07/05/20 1915 07/06/20 0403  WBC 9.5 12.6*  NEUTROABS 8.2*  --   HGB 12.5* 11.3*  HCT 40.2 36.0*  MCV 91.4 90.2  PLT 276 227   BNP (last 3 results) Recent Labs    02/16/20 1952  BNP 78.0  CBG: Recent Labs  Lab 07/06/20 1112 07/06/20 1616 07/06/20 2016 07/07/20 0717 07/07/20 1104  GLUCAP 103* 132* 114* 142* 101*    Signed:  Vassie Loll MD.  [facility] 07/07/2020, 3:15 PM

## 2020-07-08 ENCOUNTER — Other Ambulatory Visit: Payer: Self-pay

## 2020-07-08 DIAGNOSIS — Z681 Body mass index (BMI) 19 or less, adult: Secondary | ICD-10-CM | POA: Diagnosis not present

## 2020-07-08 DIAGNOSIS — J440 Chronic obstructive pulmonary disease with acute lower respiratory infection: Secondary | ICD-10-CM | POA: Diagnosis not present

## 2020-07-08 DIAGNOSIS — J441 Chronic obstructive pulmonary disease with (acute) exacerbation: Secondary | ICD-10-CM

## 2020-07-09 ENCOUNTER — Encounter: Payer: Self-pay | Admitting: *Deleted

## 2020-07-09 ENCOUNTER — Other Ambulatory Visit: Payer: Self-pay | Admitting: *Deleted

## 2020-07-09 NOTE — Patient Outreach (Signed)
[facility]) Care Management  [facility] Care Manager  07/09/2020   Jeffery [name] 07-10-1950 [id number]  Subjective:  Initial telephone outreach for transition of care. [name] was discharged on 07/07/20 after 3 day hospitalization for COPD exacerbation. He has had 3 ED visits in the last 3 months with one resulting in this admission. Past medical hx includes COPD, HTN, Hyperlipidemia, DM, Weight Loss and Malnutrition, GERD, Anxiety  Pt resides with his wife who is able to assist in anyway he needs it. He is independent but his COPD is starting to limit his activities. He does wear O2 2L continuously.  Patient was recently discharged from hospital and all medications have been reviewed. Encounter Medications:  Outpatient Encounter Medications as of 07/09/2020  Medication Sig Note  . albuterol (PROAIR HFA) 108 (90 Base) MCG/ACT inhaler Inhale 1-2 puffs into the lungs every 4 (four) hours as needed. (Patient taking differently: Inhale 1-2 puffs into the lungs every 4 (four) hours as needed for wheezing or shortness of breath.)   . ALPRAZolam (XANAX) 0.5 MG tablet Take 0.5 mg by mouth 2 (two) times daily. Takes 1/2 tab in the morning and 1 tablet at bedtime   . aspirin EC 81 MG tablet Take 81 mg by mouth daily.   Marland Kitchen atorvastatin (LIPITOR) 10 MG tablet Take 1 tablet (10 mg total) by mouth at bedtime.   . Budeson-Glycopyrrol-Formoterol (BREZTRI AEROSPHERE) 160-9-4.8 MCG/ACT AERO Inhale into the lungs 2 (two) times daily.   . cefdinir (OMNICEF) 300 MG capsule Take 1 capsule (300 mg total) by mouth 2 (two) times daily for 5 days.   . chlorthalidone (HYGROTON) 25 MG tablet Take 12.5 mg by mouth daily.   . famotidine (PEPCID) 40 MG tablet 1 daily after supper (Patient taking differently: Take 40 mg by mouth 2 (two) times daily.)   . Fluticasone-Umeclidin-Vilant (TRELEGY ELLIPTA) 100-62.5-25 MCG/INH AEPB One click each am   . furosemide (LASIX) 20 MG tablet Take 10 mg by mouth daily.    Marland Kitchen gabapentin (NEURONTIN) 300 MG capsule Take 300 mg by mouth 3 (three) times daily.    Marland Kitchen MATZIM LA 240 MG 24 hr tablet Take 240 mg by mouth daily.   . metFORMIN (GLUCOPHAGE) 500 MG tablet Take 500 mg by mouth daily.   . mirtazapine (REMERON) 15 MG tablet Take 7.5 mg by mouth at bedtime.   . Multiple Vitamin (MULTIVITAMIN WITH MINERALS) TABS tablet Take 1 tablet by mouth daily.   Marland Kitchen omeprazole (PRILOSEC) 20 MG capsule Take 20 mg by mouth daily.   . OXYGEN 2lpm 24/7   . polyethylene glycol (MIRALAX / GLYCOLAX) 17 g packet Take 17 g by mouth daily.   . potassium chloride SA (K-DUR,KLOR-CON) 20 MEQ tablet Take 20 mEq by mouth daily.    . predniSONE (DELTASONE) 20 MG tablet Take 3 tablets by mouth daily x1 day; then 2 tablet by mouth daily x2 days; then 1 tablet by mouth daily x3 days; then half tablet by mouth daily x3 days and then resume daily 5 mg tablet after that.   . predniSONE (DELTASONE) 5 MG tablet Take 1 tablet (5 mg total) by mouth daily with breakfast. Start taking it again after completion of acute tapering prescription.   . senna (SENOKOT) 8.6 MG TABS tablet Take 1 tablet by mouth daily as needed for mild constipation.   . simvastatin (ZOCOR) 20 MG tablet Take 20 mg by mouth at bedtime.    . sucralfate (CARAFATE) 1 g tablet Take 1 g  by mouth 2 (two) times daily.  07/09/2020: Takes this qid.  Marland Kitchen telmisartan (MICARDIS) 40 MG tablet Take 1 tablet (40 mg total) by mouth daily.   . isosorbide mononitrate (IMDUR) 30 MG 24 hr tablet Take 30 mg by mouth daily. (Patient not taking: Reported on 07/09/2020) 07/09/2020: Stopped in hospital.   No facility-administered encounter medications on file as of 07/09/2020.   Fall Risk  07/09/2020 07/09/2018  Falls in the past year? 0 0  Number falls in past yr: 0 0  Injury with Fall? 0 0  Risk for fall due to : Medication side effect -  Follow up Falls evaluation completed -   Depression screen [facility] 2/9 07/09/2020  Decreased Interest 1  Down, Depressed,  Hopeless 1  PHQ - 2 Score 2  Altered sleeping 0  Tired, decreased energy 0  Change in appetite 0  Feeling bad or failure about yourself  1  Trouble concentrating 0  Moving slowly or fidgety/restless 0  Suicidal thoughts 0  PHQ-9 Score 3  Difficult doing work/chores Not difficult at all    Goals Addressed              This Visit's Progress     Patient Stated   .  Will begin drinking at least one nutritional supplement daily over the next month per pt & wife report. (pt-stated)        Start date: 07/09/20 Short term goal Priority High Expected end date 08/14/20  Notes: 07/09/20 Pt is not drinking a supplement at present. Has lost 5# in last 3 months. Current wt is 105#, Ht 5'6" BMI 17. NP to send coupons. Discussed pt calorie needs with his severe COPD and need to be takinig in more nutrition. Advised to eat 3 meals and drink the shake in addition not in place of.    .  Will start organizing meds in med box within the next 30 days per pt/wife report.. (pt-stated)        Goal start: 07/09/20 Short term Medium priority Expected end date 08/14/20  Notes: 07/09/20 Completed medication reconcilliation. Pt takes 18 PO meds and still is taking them from all his bottles every day. Educated that it would be simplier if he would adopt this method to ensure he is getting all his meds. Pt agrees and wife will assist in the transition.      Other   .  Pt and wife will complete HCPOA and Living Will within the next 30 days per report.        Start date: 07/09/20 Short term goal Medium priority Expected end date: 08/14/20  Notes: 07/09/20 Discussed need to complete these documents. Pt and wife are willing to do so.    .  Track and Manage My Symptoms-COPD as evidenced by pt report on each encounter.        Timeframe:  Long-Range Goal Priority:  High Start Date:    07/09/20                         Expected End Date:    10/29/20                   Follow Up Date  08/14/20   - develop a rescue  plan - follow rescue plan if symptoms flare-up    Why is this important?    Tracking your symptoms and other information about your health helps your doctor plan your care.  Write down the symptoms, the time of day, what you were doing and what medicine you are taking.   You will soon learn how to manage your symptoms.     Notes 07/09/20 Patient is knowledgable about his COPD Action Plan from previous involvement with [facility] Health Coach. Needs reinforcement and encouragement to call for assist early.        Plan: Pt agreed to participate in ongoing care management and a call next week. Follow-up:  Patient agrees to Care Plan and Follow-up.   Zara Council. Burgess Estelle, MSN, [facility] Gerontological Nurse Practitioner [place] Care Management [phone]

## 2020-07-13 ENCOUNTER — Emergency Department (HOSPITAL_COMMUNITY)
Admission: EM | Admit: 2020-07-13 | Discharge: 2020-07-13 | Disposition: A | Discharge status: Home / Self Care | Payer: Medicare Other | Attending: Emergency Medicine | Admitting: Emergency Medicine

## 2020-07-13 ENCOUNTER — Encounter (HOSPITAL_COMMUNITY): Payer: Self-pay | Admitting: *Deleted

## 2020-07-13 ENCOUNTER — Telehealth: Payer: Self-pay | Admitting: Internal Medicine

## 2020-07-13 ENCOUNTER — Emergency Department (HOSPITAL_COMMUNITY): Payer: Medicare Other

## 2020-07-13 ENCOUNTER — Other Ambulatory Visit: Payer: Self-pay

## 2020-07-13 DIAGNOSIS — R410 Disorientation, unspecified: Secondary | ICD-10-CM | POA: Insufficient documentation

## 2020-07-13 DIAGNOSIS — Z79899 Other long term (current) drug therapy: Secondary | ICD-10-CM | POA: Insufficient documentation

## 2020-07-13 DIAGNOSIS — Z87891 Personal history of nicotine dependence: Secondary | ICD-10-CM | POA: Insufficient documentation

## 2020-07-13 DIAGNOSIS — R0602 Shortness of breath: Secondary | ICD-10-CM | POA: Diagnosis not present

## 2020-07-13 DIAGNOSIS — J432 Centrilobular emphysema: Secondary | ICD-10-CM | POA: Diagnosis not present

## 2020-07-13 DIAGNOSIS — Z7982 Long term (current) use of aspirin: Secondary | ICD-10-CM | POA: Diagnosis not present

## 2020-07-13 DIAGNOSIS — R062 Wheezing: Secondary | ICD-10-CM | POA: Diagnosis not present

## 2020-07-13 DIAGNOSIS — I10 Essential (primary) hypertension: Secondary | ICD-10-CM | POA: Insufficient documentation

## 2020-07-13 DIAGNOSIS — J439 Emphysema, unspecified: Secondary | ICD-10-CM | POA: Diagnosis not present

## 2020-07-13 DIAGNOSIS — Z7952 Long term (current) use of systemic steroids: Secondary | ICD-10-CM | POA: Insufficient documentation

## 2020-07-13 DIAGNOSIS — Z7984 Long term (current) use of oral hypoglycemic drugs: Secondary | ICD-10-CM | POA: Insufficient documentation

## 2020-07-13 DIAGNOSIS — J441 Chronic obstructive pulmonary disease with (acute) exacerbation: Secondary | ICD-10-CM | POA: Diagnosis not present

## 2020-07-13 DIAGNOSIS — I700 Atherosclerosis of aorta: Secondary | ICD-10-CM | POA: Diagnosis not present

## 2020-07-13 DIAGNOSIS — J984 Other disorders of lung: Secondary | ICD-10-CM | POA: Diagnosis not present

## 2020-07-13 DIAGNOSIS — J449 Chronic obstructive pulmonary disease, unspecified: Secondary | ICD-10-CM | POA: Diagnosis not present

## 2020-07-13 DIAGNOSIS — R7303 Prediabetes: Secondary | ICD-10-CM | POA: Insufficient documentation

## 2020-07-13 DIAGNOSIS — I2510 Atherosclerotic heart disease of native coronary artery without angina pectoris: Secondary | ICD-10-CM | POA: Diagnosis not present

## 2020-07-13 MED ORDER — ALBUTEROL SULFATE HFA 108 (90 BASE) MCG/ACT IN AERS
2.0000 | INHALATION_SPRAY | Freq: Once | RESPIRATORY_TRACT | Status: AC
  Administered 2020-07-13: 2 via RESPIRATORY_TRACT
  Filled 2020-07-13: qty 6.7

## 2020-07-13 NOTE — ED Provider Notes (Addendum)
[facility] Provider Note   CSN: [id number] Arrival date & time: 07/13/20  1141     History Chief Complaint  Patient presents with  . Shortness of Breath    Jeffery FiscalMichael L [name] is a 70 y.o. male.  Patient in for shortness of breath and wheezing.  Patient recently admitted for COPD exacerbation March 6 through March 8.  Was discharged with albuterol inhaler and then using his nebulizers at home and a steroid taper.  It sounds as if patient got confused about steroid taper.  Saw his primary care doctor recently who is got him reorganized with that.  Patient normally on oxygen.  Patient normally on 2 L of oxygen.  And on that here he is satting in the upper 90s.  He is mentating fine.  Patient apparently has a home pulse ox that has been getting lower numbers and that somewhat resulted in him coming in.  Occasionally can hear a wheeze.  The patient able to talk in full sentences without any difficulty        Past Medical History:  Diagnosis Date  . Chest pain 04/2011   Normal echocardiogram  . COPD (chronic obstructive pulmonary disease) (HCC)   . Fasting hyperglycemia    Normal hemoglobin A1c of 6.0  . GAD (generalized anxiety disorder)   . GERD (gastroesophageal reflux disease)   . Hyperlipemia   . Hypertension   . On home O2    2L N/C  . On home O2    at night  . Prediabetes 06/04/2015  . Tobacco abuse     Patient Active Problem List   Diagnosis Date Noted  . COPD with acute exacerbation (HCC) 07/06/2020  . Leukocytosis 02/18/2020  . Protein-calorie malnutrition, severe 02/18/2020  . On home O2   . GERD (gastroesophageal reflux disease)   . GAD (generalized anxiety disorder)   . CAP (community acquired pneumonia) 02/17/2020  . Bilateral pneumonia 02/17/2020  . Weight loss 01/28/2019  . COPD  GOLD ?  11/04/2018  . Chronic respiratory failure with hypoxia (HCC) 10/04/2018  . Acute on chronic respiratory failure with hypoxia (HCC) 11/07/2016  .  Prediabetes 06/04/2015  . Hyperglycemia, drug-induced 06/03/2015  . Essential hypertension 06/03/2015  . COPD exacerbation (HCC) 06/02/2015  . Hyperlipidemia 07/20/2011  . Fasting hyperglycemia   . Tobacco abuse   . Chest pain 04/02/2011    Past Surgical History:  Procedure Laterality Date  . APPENDECTOMY    . APPENDECTOMY    . CATARACT EXTRACTION W/PHACO Left 06/10/2019   Procedure: CATARACT EXTRACTION PHACO AND INTRAOCULAR LENS PLACEMENT (IOC);  Surgeon: Fabio PierceWrzosek, James, MD;  Location: [facility];  Service: Ophthalmology;  Laterality: Left;  CDE: 3.73  . CATARACT EXTRACTION W/PHACO Right 06/24/2019   Procedure: CATARACT EXTRACTION PHACO AND INTRAOCULAR LENS PLACEMENT (IOC);  Surgeon: Fabio PierceWrzosek, James, MD;  Location: [facility];  Service: Ophthalmology;  Laterality: Right;  CDE: 4.42  . LEFT HEART CATHETERIZATION WITH CORONARY ANGIOGRAM N/A 05/17/2011   Procedure: LEFT HEART CATHETERIZATION WITH CORONARY ANGIOGRAM;  Surgeon: Tonny BollmanMichael Cooper, MD;  [facility];  Service: Cardiovascular;  Laterality: N/A;       Family History  Problem Relation Age of Onset  . Emphysema Father     Social History   Tobacco Use  . Smoking status: Former Smoker    Packs/day: 1.00    Years: 35.00    Pack years: 35.00    Types: Cigarettes    Quit date: 05/09/2013    Years since quitting:  7.1  . Smokeless tobacco: Never Used  Vaping Use  . Vaping Use: Never used  Substance Use Topics  . Alcohol use: No  . Drug use: No    Home Medications Prior to Admission medications   Medication Sig Start Date End Date Taking? Authorizing Provider  albuterol (PROAIR HFA) 108 (90 Base) MCG/ACT inhaler Inhale 1-2 puffs into the lungs every 4 (four) hours as needed. Patient taking differently: Inhale 1-2 puffs into the lungs every 4 (four) hours as needed for wheezing or shortness of breath. 11/16/18   Nyoka Cowden, MD  ALPRAZolam Prudy Feeler) 0.5 MG tablet Take 0.5 mg by mouth 2 (two) times daily. Takes 1/2 tab in the  morning and 1 tablet at bedtime    [provider]  aspirin EC 81 MG tablet Take 81 mg by mouth daily.    [provider]  atorvastatin (LIPITOR) 10 MG tablet Take 1 tablet (10 mg total) by mouth at bedtime. 07/07/20   Vassie Loll, MD  Budeson-Glycopyrrol-Formoterol (BREZTRI AEROSPHERE) 160-9-4.8 MCG/ACT AERO Inhale into the lungs 2 (two) times daily. 07/08/20   [provider]  chlorthalidone (HYGROTON) 25 MG tablet Take 12.5 mg by mouth daily.    [provider]  famotidine (PEPCID) 40 MG tablet 1 daily after supper Patient taking differently: Take 40 mg by mouth 2 (two) times daily. 10/03/18   Nyoka Cowden, MD  Fluticasone-Umeclidin-Vilant (TRELEGY ELLIPTA) 100-62.5-25 MCG/INH AEPB One click each am 10/18/19   Nyoka Cowden, MD  furosemide (LASIX) 20 MG tablet Take 10 mg by mouth daily. 11/27/19   [provider]  gabapentin (NEURONTIN) 300 MG capsule Take 300 mg by mouth 3 (three) times daily.  03/22/13   [provider]  isosorbide mononitrate (IMDUR) 30 MG 24 hr tablet Take 30 mg by mouth daily. Patient not taking: Reported on 07/09/2020 03/22/14   [provider]  MATZIM LA 240 MG 24 hr tablet Take 240 mg by mouth daily. 04/16/19   [provider]  metFORMIN (GLUCOPHAGE) 500 MG tablet Take 500 mg by mouth daily.    [provider]  mirtazapine (REMERON) 15 MG tablet Take 7.5 mg by mouth at bedtime. 02/12/20   [provider]  Multiple Vitamin (MULTIVITAMIN WITH MINERALS) TABS tablet Take 1 tablet by mouth daily.    [provider]  omeprazole (PRILOSEC) 20 MG capsule Take 20 mg by mouth daily.    [provider]  OXYGEN 2lpm 24/7    [provider]  polyethylene glycol (MIRALAX / GLYCOLAX) 17 g packet Take 17 g by mouth daily.    [provider]  potassium chloride SA (K-DUR,KLOR-CON) 20 MEQ tablet Take 20 mEq by mouth daily.  05/12/14   [provider]   predniSONE (DELTASONE) 20 MG tablet Take 3 tablets by mouth daily x1 day; then 2 tablet by mouth daily x2 days; then 1 tablet by mouth daily x3 days; then half tablet by mouth daily x3 days and then resume daily 5 mg tablet after that. 07/07/20   Vassie Loll, MD  predniSONE (DELTASONE) 5 MG tablet Take 1 tablet (5 mg total) by mouth daily with breakfast. Start taking it again after completion of acute tapering prescription. 07/07/20   Vassie Loll, MD  senna (SENOKOT) 8.6 MG TABS tablet Take 1 tablet by mouth daily as needed for mild constipation.    [provider]  simvastatin (ZOCOR) 20 MG tablet Take 20 mg by mouth at bedtime.  03/22/14  [provider]  sucralfate (CARAFATE) 1 g tablet Take 1 g by mouth 2 (two) times daily.  07/19/18   [provider]  telmisartan (MICARDIS) 40 MG tablet Take 1 tablet (40 mg total) by mouth daily. 06/23/20   Nyoka Cowden, MD    Allergies    Codeine  Review of Systems   Review of Systems  Constitutional: Negative for chills and fever.  HENT: Negative for congestion, rhinorrhea and sore throat.   Eyes: Negative for visual disturbance.  Respiratory: Positive for shortness of breath and wheezing. Negative for cough.   Cardiovascular: Negative for chest pain and leg swelling.  Gastrointestinal: Negative for abdominal pain, diarrhea, nausea and vomiting.  Genitourinary: Negative for dysuria.  Musculoskeletal: Negative for back pain and neck pain.  Skin: Negative for rash.  Neurological: Negative for dizziness, light-headedness and headaches.  Hematological: Does not bruise/bleed easily.  Psychiatric/Behavioral: Negative for confusion.    Physical Exam Updated Vital Signs BP (!) 167/90   Pulse 79   Temp 98.1 F (36.7 C) (Oral)   Resp 18   Ht 1.676 m (5\' 6" )   Wt 47.6 kg   SpO2 97%   BMI 16.95 kg/m   Physical Exam Vitals and nursing note reviewed.  Constitutional:      Appearance: Normal appearance. He is  well-developed.  HENT:     Head: Normocephalic and atraumatic.  Eyes:     Extraocular Movements: Extraocular movements intact.     Conjunctiva/sclera: Conjunctivae normal.     Pupils: Pupils are equal, round, and reactive to light.  Cardiovascular:     Rate and Rhythm: Normal rate and regular rhythm.     Heart sounds: No murmur heard.   Pulmonary:     Effort: Pulmonary effort is normal. No respiratory distress.     Breath sounds: Wheezing present.     Comments: Breath sounds are decreased bilaterally. Abdominal:     Palpations: Abdomen is soft.     Tenderness: There is no abdominal tenderness.  Musculoskeletal:        General: Normal range of motion.     Cervical back: Normal range of motion and neck supple.  Skin:    General: Skin is warm and dry.  Neurological:     General: No focal deficit present.     Mental Status: He is alert and oriented to person, place, and time.     Cranial Nerves: No cranial nerve deficit.     Sensory: No sensory deficit.     Motor: No weakness.     ED Results / Procedures / Treatments   Labs (all labs ordered are listed, but only abnormal results are displayed) Labs Reviewed - No data to display  EKG EKG Interpretation  Date/Time:  Monday July 13 2020 12:01:36 EDT Ventricular Rate:  83 PR Interval:    QRS Duration: 110 QT Interval:  353 QTC Calculation: 413 R Axis:   89 Text Interpretation: Sinus rhythm LAE, consider biatrial enlargement Borderline right axis deviation Repol abnrm suggests ischemia, diffuse leads ST elevation, consider inferior injury Confirmed by [id number] [phone]) on 07/13/2020 12:16:41 PM   Radiology DG Chest Port 1 View  Result Date: 07/13/2020 CLINICAL DATA:  Shortness of breath, COPD. EXAM: PORTABLE CHEST 1 VIEW COMPARISON:  None. FINDINGS: Trachea is midline. Heart size stable. Lungs are emphysematous. A small nodular density is seen in the right midlung zone. No airspace consolidation or pleural fluid.  IMPRESSION: 1. No acute findings. 2. Small nodular density in the  right midlung zone. Consider non emergent CT chest without contrast in further evaluation, as clinically indicated. 3.  Emphysema (ICD10-J43.9). Electronically Signed   By: Leanna Battles M.D.   On: 07/13/2020 13:40    Procedures Procedures   Medications Ordered in ED Medications  albuterol (VENTOLIN HFA) 108 (90 Base) MCG/ACT inhaler 2 puff (2 puffs Inhalation Given 07/13/20 1351)    ED Course  I have reviewed the triage vital signs and the nursing notes.  Pertinent labs & imaging results that were available during my care of the patient were reviewed by me and considered in my medical decision making (see chart for details).    MDM Rules/Calculators/A&P                          Patient here treated with albuterol inhaler.  Patient already on steroids.  Chest x-ray done to make sure no evidence of pneumonia.  That raise some concerns about a small nodule density in the right midlung zone.  They recommended CT scan so since patient was here certainly seems to have some confusion with follow-up CT without has been ordered.  We will also ambulate patient while on oxygen to make sure he does not get extremely short of breath.  If he does he may require admission.  Patient ambulated fine.  Oxygen sats never went below 94%.  Following CT scan chest patient stable for discharge home.  CT scan shows probably what is scarring.  But raises some concerns about T7 which could represent may be a compression fracture or could be malignancy.  This will need to be followed up by his primary care doctor.  Patient made aware of this.  Patient will be discharged home using albuterol inhaler every 6 hours and his other medications and getting back onto his steroid taper.  And following back up with his primary care doctor.   Final Clinical Impression(s) / ED Diagnoses Final diagnoses:  COPD exacerbation [facility])    Rx / DC Orders ED  Discharge Orders    None       Vanetta Mulders, MD 07/13/20 1452    Vanetta Mulders, MD 07/13/20 [phone]

## 2020-07-13 NOTE — Telephone Encounter (Signed)
Call returned to patient wife Bonite, confirmed patient DOB. Patient completed the antibiotics yesterday morning and completed the prednisone this morning. They report he is still having increased SOB. Denies fevers. He denies being around with covid or sick. Patient seen in hospital 07/05/20 for COPD exacerbation. He is using 2L of oxygen. His oxygen is currently 84% no 2L while at rest. Patient reports increased mucous production and cough.   MW please advise. Thanks :)

## 2020-07-13 NOTE — ED Triage Notes (Signed)
Pt here for sob for past 3-4 days, recent admitted for COPD.  Pt on home O@ at 2 L/M

## 2020-07-13 NOTE — ED Notes (Signed)
Ambulated pt 94% 2LNC, pt anxious saying he cant breathe pt educated on sats at 94%.

## 2020-07-13 NOTE — ED Notes (Signed)
Upon discharge pt very anxious not wanting to leave bc he said he cannot breathe.  Pt sats 97% 2LNC.  Educated on following up with pcp for additional treatments.  Wife at bedside during education.  Pt assisted via wheelchair to front lobby and into jeep.

## 2020-07-13 NOTE — Telephone Encounter (Signed)
Not clear why this was sent to me? It is a Dr. Sherene Sires patient and Arlys John is in [place] today.

## 2020-07-13 NOTE — Telephone Encounter (Signed)
pt wife is calling because pt is having trouble breathing, pt wife states that pt cannot walk to the bathroom without having SOB.. On 07/05/20 pt was leaving from church, pt was having trouble breathing, pt was admitted for two days at hospital on that same day, pt was given an antibiotics & prednisone, took last ones of both medications yesterday,(07/12/20) Pt wife doesn't believe pt has gotten better please advise [phone]

## 2020-07-13 NOTE — Telephone Encounter (Signed)
If sats that low on 02 will need to ER as can't fix this in office

## 2020-07-13 NOTE — Telephone Encounter (Signed)
Called and spoke with pts wife and she is aware of MW recs.

## 2020-07-13 NOTE — Discharge Instructions (Addendum)
Work-up with the CT scan.  Just showed some scarring.  Your primary care doctor can follow this up.  Also it did raise some concern about one of your thoracic vertebrae may be had compression fracture or could represent sclerotic lesion from a neoplastic process.  This would be the most important thing to have followed up.  In the meantime use your albuterol inhaler and take your steroids as discussed on the taper.  Your oxygen levels were fine here with walking.  Continue use your oxygen as directed.

## 2020-07-13 NOTE — ED Notes (Signed)
Pt watching O2 monitor states at home his O2 monitor is showing 84%.  Says he doesn't feel like its right.  Pt is anxious regarding the pulse ox meter.  Pt educated on pulse Ox meter and use.  Pt alert oriented 100% 2LNC

## 2020-07-16 ENCOUNTER — Other Ambulatory Visit: Payer: Self-pay | Admitting: *Deleted

## 2020-07-16 NOTE — Patient Outreach (Signed)
[facility]) Care Management  [facility] Care Manager  07/16/2020  Addended 07/17/20   Jeffery [name] 04-27-1951 [id number]  Subjective: Telephone outreach to follow up on COPD and recent ED visit for same. Unsuccessful.  Called again today 3/18 and was able to talk to Mr. And Mrs. Jeffery [name]. [name] had another ED visit for hypoxia. Has a very complicated regimen. O2 cannula was plugged.  Received HCPOA documents.  Encounter Medications:  Outpatient Encounter Medications as of 07/16/2020  Medication Sig Note  . albuterol (PROAIR HFA) 108 (90 Base) MCG/ACT inhaler Inhale 1-2 puffs into the lungs every 4 (four) hours as needed. (Patient taking differently: Inhale 1-2 puffs into the lungs every 4 (four) hours as needed for wheezing or shortness of breath.)   . ALPRAZolam (XANAX) 0.5 MG tablet Take 0.5 mg by mouth 2 (two) times daily. Takes 1/2 tab in the morning and 1 tablet at bedtime   . aspirin EC 81 MG tablet Take 81 mg by mouth daily.   Marland Kitchen atorvastatin (LIPITOR) 10 MG tablet Take 1 tablet (10 mg total) by mouth at bedtime.   . Budeson-Glycopyrrol-Formoterol (BREZTRI AEROSPHERE) 160-9-4.8 MCG/ACT AERO Inhale into the lungs 2 (two) times daily.   . chlorthalidone (HYGROTON) 25 MG tablet Take 12.5 mg by mouth daily.   . famotidine (PEPCID) 40 MG tablet 1 daily after supper (Patient taking differently: Take 40 mg by mouth 2 (two) times daily.)   . Fluticasone-Umeclidin-Vilant (TRELEGY ELLIPTA) 100-62.5-25 MCG/INH AEPB One click each am (Patient not taking: No sig reported)   . furosemide (LASIX) 20 MG tablet Take 10 mg by mouth daily.   Marland Kitchen gabapentin (NEURONTIN) 300 MG capsule Take 300 mg by mouth 3 (three) times daily.    . isosorbide mononitrate (IMDUR) 30 MG 24 hr tablet Take 30 mg by mouth daily. (Patient not taking: No sig reported) 07/09/2020: Stopped in hospital.  . MATZIM LA 240 MG 24 hr tablet Take 240 mg by mouth daily.   . metFORMIN (GLUCOPHAGE) 500 MG tablet Take  500 mg by mouth daily.   . mirtazapine (REMERON) 15 MG tablet Take 7.5 mg by mouth at bedtime.   . Multiple Vitamin (MULTIVITAMIN WITH MINERALS) TABS tablet Take 1 tablet by mouth daily.   Marland Kitchen omeprazole (PRILOSEC) 20 MG capsule Take 20 mg by mouth daily.   . OXYGEN 2lpm 24/7   . polyethylene glycol (MIRALAX / GLYCOLAX) 17 g packet Take 17 g by mouth daily.   . potassium chloride SA (K-DUR,KLOR-CON) 20 MEQ tablet Take 20 mEq by mouth daily.    . predniSONE (DELTASONE) 20 MG tablet Take 3 tablets by mouth daily x1 day; then 2 tablet by mouth daily x2 days; then 1 tablet by mouth daily x3 days; then half tablet by mouth daily x3 days and then resume daily 5 mg tablet after that.   . predniSONE (DELTASONE) 5 MG tablet Take 1 tablet (5 mg total) by mouth daily with breakfast. Start taking it again after completion of acute tapering prescription. (Patient not taking: No sig reported)   . senna (SENOKOT) 8.6 MG TABS tablet Take 1 tablet by mouth daily as needed for mild constipation.   . simvastatin (ZOCOR) 20 MG tablet Take 20 mg by mouth at bedtime.    . sucralfate (CARAFATE) 1 g tablet Take 1 g by mouth 2 (two) times daily.  07/09/2020: Takes this qid.  Marland Kitchen telmisartan (MICARDIS) 40 MG tablet Take 1 tablet (40 mg total) by mouth daily.  No facility-administered encounter medications on file as of 07/16/2020.    Functional Status:  In your present state of health, do you have any difficulty performing the following activities: 07/09/2020 07/06/2020  Hearing? N N  Vision? N Y  Difficulty concentrating or making decisions? N N  Walking or climbing stairs? Y Y  Dressing or bathing? Y Y  Comment - becomes extremely SOB  Doing errands, shopping? Y Y  Comment - secondary to SOB  Preparing Food and eating ? N -  Using the Toilet? N -  In the past six months, have you accidently leaked urine? N -  Do you have problems with loss of bowel control? N -  Managing your Medications? N -  Managing your  Finances? N -  Housekeeping or managing your Housekeeping? Y -  Some recent data might be hidden    Fall/Depression Screening: Fall Risk  07/09/2020 07/09/2018  Falls in the past year? 0 0  Number falls in past yr: 0 0  Injury with Fall? 0 0  Risk for fall due to : Medication side effect -  Follow up Falls evaluation completed -   PHQ 2/9 Scores 07/09/2020 07/09/2018  PHQ - 2 Score 2 -  PHQ- 9 Score 3 -  Exception Documentation - Other- indicate reason in comment box  Not completed - call completed with spouse    Assessment: Stage IV COPD  Goals Addressed            This Visit's Progress   . Pt and wife will complete HCPOA and Living Will within the next 30 days per report.   On track    Start date: 07/09/20 Short term goal Medium priority Expected end date: 08/14/20  Notes: 07/09/20 Discussed need to complete these documents. Pt and wife are willing to do so. 07/17/20 Pt and wife received the documents. Discussed completion at home except for signature page and take to notary to witness signature and make copies. Give to MD, keep one and give a copy to #2 person named as secondary HCPOA.    . Track and Manage My Symptoms-COPD as evidenced by pt report on each encounter.   Not on track    Timeframe:  Long-Range Goal Priority:  High Start Date:    07/09/20                         Expected End Date:    10/29/20                   Follow Up Date  08/14/20   - develop a rescue plan - follow rescue plan if symptoms flare-up    Why is this important?    Tracking your symptoms and other information about your health helps your doctor plan your care.   Write down the symptoms, the time of day, what you were doing and what medicine you are taking.   You will soon learn how to manage your symptoms.     Notes 07/09/20 Patient is knowledgable about his COPD Action Plan from previous involvement with [facility]. Needs reinforcement and encouragement to call for assist early. 07/17/20  Pt had another ED visit. Home pulse Ox reading 84% and pt anxious and SOB. Respiratory medication regimen reinforced. Advised to take nebulizer prn if having wheezing and rattling in his chest so he can cough out the mucous and feel more comfortable. Also he can take an extra alprazolam when he  is starting the cycle of some SOB, mounting anxiety, more SOB, etc. Will visit pt at home and go over sx relieving methods, pursed lipped breathing.      Plan: Pt and wife agreed to a home visit on Wednesday, March 23rd.  Follow-up:  Patient agrees to Care Plan and Follow-up.   Zara Council. Burgess Estelle, MSN, [facility] Gerontological Nurse Practitioner [facility] Care Management [phone]

## 2020-07-22 ENCOUNTER — Encounter: Payer: Self-pay | Admitting: *Deleted

## 2020-07-22 ENCOUNTER — Other Ambulatory Visit: Payer: Self-pay | Admitting: *Deleted

## 2020-07-22 ENCOUNTER — Other Ambulatory Visit: Payer: Self-pay

## 2020-07-22 ENCOUNTER — Ambulatory Visit: Payer: Medicare Other | Admitting: *Deleted

## 2020-07-23 NOTE — Patient Outreach (Signed)
[facility]) Care Management  [place]  07/23/2020   BASEM YANNUZZI 05/28/1950 [id number]  Subjective: Initial home visit for [place] IV COPD Patient. Met with pt and wife in their home today.  Objective:  BP (!) 122/50   Pulse 90   Resp 20   Ht 1.676 m ('5\' 6"' )   Wt 106 lb (48.1 kg)   SpO2 96%   BMI 17.11 kg/m  Heart RRR Lungs clear with minimal movement of air bilaterally  Encounter Medications:  Outpatient Encounter Medications as of 07/22/2020  Medication Sig Note  . albuterol (PROAIR HFA) 108 (90 Base) MCG/ACT inhaler Inhale 1-2 puffs into the lungs every 4 (four) hours as needed. (Patient taking differently: Inhale 1-2 puffs into the lungs every 4 (four) hours as needed for wheezing or shortness of breath.)   . ALPRAZolam (XANAX) 0.5 MG tablet Take 0.5 mg by mouth 2 (two) times daily. Takes 1/2 tab in the morning and 1 tablet at bedtime   . aspirin EC 81 MG tablet Take 81 mg by mouth daily.   Marland Kitchen atorvastatin (LIPITOR) 10 MG tablet Take 1 tablet (10 mg total) by mouth at bedtime.   . Budeson-Glycopyrrol-Formoterol (BREZTRI AEROSPHERE) 160-9-4.8 MCG/ACT AERO Inhale into the lungs 2 (two) times daily.   . famotidine (PEPCID) 40 MG tablet 1 daily after supper (Patient taking differently: Take 40 mg by mouth 2 (two) times daily.)   . furosemide (LASIX) 20 MG tablet Take 10 mg by mouth daily.   Marland Kitchen gabapentin (NEURONTIN) 300 MG capsule Take 300 mg by mouth 3 (three) times daily.    Marland Kitchen MATZIM LA 240 MG 24 hr tablet Take 240 mg by mouth daily.   . metFORMIN (GLUCOPHAGE) 500 MG tablet Take 500 mg by mouth daily.   . mirtazapine (REMERON) 15 MG tablet Take 7.5 mg by mouth at bedtime.   . Multiple Vitamin (MULTIVITAMIN WITH MINERALS) TABS tablet Take 1 tablet by mouth daily.   Marland Kitchen omeprazole (PRILOSEC) 20 MG capsule Take 20 mg by mouth daily.   . OXYGEN 2lpm 24/7   . polyethylene glycol (MIRALAX / GLYCOLAX) 17 g packet Take 17 g by mouth daily.   .  potassium chloride SA (K-DUR,KLOR-CON) 20 MEQ tablet Take 20 mEq by mouth daily.    . predniSONE (DELTASONE) 20 MG tablet Take 3 tablets by mouth daily x1 day; then 2 tablet by mouth daily x2 days; then 1 tablet by mouth daily x3 days; then half tablet by mouth daily x3 days and then resume daily 5 mg tablet after that.   . predniSONE (DELTASONE) 5 MG tablet Take 1 tablet (5 mg total) by mouth daily with breakfast. Start taking it again after completion of acute tapering prescription.   . senna (SENOKOT) 8.6 MG TABS tablet Take 1 tablet by mouth daily as needed for mild constipation.   . sucralfate (CARAFATE) 1 g tablet Take 1 g by mouth 2 (two) times daily.  07/09/2020: Takes this qid.  Marland Kitchen telmisartan (MICARDIS) 40 MG tablet Take 1 tablet (40 mg total) by mouth daily.   . Fluticasone-Umeclidin-Vilant (TRELEGY ELLIPTA) 100-62.5-25 MCG/INH AEPB One click each am (Patient not taking: No sig reported)   . isosorbide mononitrate (IMDUR) 30 MG 24 hr tablet Take 30 mg by mouth daily. (Patient not taking: No sig reported) 07/09/2020: Stopped in hospital.  . [DISCONTINUED] chlorthalidone (HYGROTON) 25 MG tablet Take 12.5 mg by mouth daily. (Patient not taking: Reported on 07/22/2020)   . [DISCONTINUED]  simvastatin (ZOCOR) 20 MG tablet Take 20 mg by mouth at bedtime.     No facility-administered encounter medications on file as of 07/22/2020.    Functional Status:  In your present state of health, do you have any difficulty performing the following activities: 07/22/2020 07/09/2020  Hearing? - N  Vision? - N  Difficulty concentrating or making decisions? - N  Walking or climbing stairs? - Y  Dressing or bathing? - Y  Comment - -  Doing errands, shopping? - Y  [place] and eating ? N N  Using the Toilet? N N  In the past six months, have you accidently leaked urine? N N  Do you have problems with loss of bowel control? N N  Managing your Medications? N N  Managing your Finances? N N   Housekeeping or managing your Housekeeping? Y Y  Some recent data might be hidden    Fall/Depression Screening: Fall Risk  07/22/2020 07/09/2020 07/09/2018  Falls in the past year? 0 0 0  Number falls in past yr: 0 0 0  Injury with Fall? 0 0 0  Risk for fall due to : Medication side effect Medication side effect -  Follow up Falls evaluation completed Falls evaluation completed -   PHQ 2/9 Scores 07/22/2020 07/09/2020 07/09/2018  PHQ - 2 Score 2 2 -  PHQ- 9 Score 3 3 -  Exception Documentation - - Other- indicate reason in comment box  Not completed - - call completed with spouse    Assessment: END STAGE COPD  Goals Addressed              This Visit's Progress     Patient Stated   .  Will begin drinking at least one nutritional supplement daily over the next month per pt & wife report. (pt-stated)   On track     Start date: 07/09/20 Short term goal Priority High Expected end date 08/14/20  Notes: 07/09/20 Pt is not drinking a supplement at present. Has lost 5# in last 3 months. Current wt is 105#, Ht 5'6" BMI 17. NP to send coupons. Discussed pt calorie needs with his severe COPD and need to be takinig in more nutrition. Advised to eat 3 meals and drink the shake in addition not in place of. 07/22/20 Pt wt is 106# today, BMI of 17. Will send nutritional supplement coupons, drink one or 2 per day in addition to regular meals.He does report he has a good appetite when his breathing is good.    .  COMPLETED: Will start organizing meds in med box within the next 30 days per pt/wife report.. (pt-stated)        Goal start: 07/09/20 Short term Medium priority Expected end date 08/14/20, Completed on 07/22/20.  Notes: 07/09/20 Completed medication reconcilliation. Pt takes 18 PO meds and still is taking them from all his bottles every day. Educated that it would be simplier if he would adopt this method to ensure he is getting all his meds. Pt agrees and wife will assist in the transition.  07/22/20 Assessed in home medication organization. Mr. Stetzer is very particular and precise about his medication regimen. He has his am meds in the bottles on one shelf and his pms on another. Every morning he places his meds in a daily med box. He has been resistant to use a weekly box. He does have meds that have been discontinued and does not want to get rid of them, but  they are not intermingled with current meds. All meds are within an arms length from his place on the couch where he sits. This system works well for him and he is adherent with his regimen.      Other   .  Pt and wife will complete HCPOA and Living Will within the next 30 days per report.   On track     Start date: 07/09/20 Short term goal Medium priority Expected end date: 08/14/20  Notes: 07/09/20 Discussed need to complete these documents. Pt and wife are willing to do so. 07/17/20 Pt and wife received the documents. Discussed completion at home except for signature page and take to notary to witness signature and make copies. Give to MD, keep one and give a copy to #2 person named as secondary HCPOA. 07/22/20 Discussed detail of HCPOA and Living Will also discussed MOST form. Educated on CPR success rate and poor outcomes in those with chronic illness. Requested that they complete these documents as soon as possible.    .  Track and Manage My Symptoms-COPD as evidenced by pt report on each encounter.   On track     Timeframe:  Long-Range Goal Priority:  High Start Date:    07/09/20                         Expected End Date:    10/29/20                   Follow Up Date  08/14/20   - develop a rescue plan - follow rescue plan if symptoms flare-up    Why is this important?    Tracking your symptoms and other information about your health helps your doctor plan your care.   Write down the symptoms, the time of day, what you were doing and what medicine you are taking.   You will soon learn how to manage your symptoms.      Notes 07/09/20 Patient is knowledgable about his COPD Action Plan from previous involvement with [place]. Needs reinforcement and encouragement to call for assist early. 07/17/20 Pt had another ED visit. Home pulse Ox reading 84% and pt anxious and SOB. Respiratory medication regimen reinforced. Advised to take nebulizer prn if having wheezing and rattling in his chest so he can cough out the mucous and feel more comfortable. Also he can take an extra alprazolam when he is starting the cycle of some SOB, mounting anxiety, more SOB, etc. Will visit pt at home and go over sx relieving methods, pursed lipped breathing. 07/22/20 Reviewed specific COPD Action Plan in detail per pulmonolgist, Dr. Melvyn Novas. Put plan in pt [facility] calendar for quick access. Pt and wife are familiar and know what to do. Encouraged pt to take additional alprazolam 0.5 if he begins to feel anxious with increasing sxs to relieve the cycle.      Spent 30 minutes discussing patient's Stage IV COPD and what that meant. Showed COPD disease trajectory with approaching end of life especially with his increase in ED visits and hospitalizations. Encouraged to accept this information although very difficult so that he can inform his family and enjoy them and everyone can be more prepared to anticipate the inevitable. Gave information on palliative care and encouraged to allow me to make that referral. Will ask next week if I may. Also discussed transition to Hospice services at the appropriate time. This was very difficult for Mr. Cheron Schaumann  to hear. He did shed some tears. (Mrs. Sokolov has expected this and says her husband is NOT HEARING what his providers have been telling him)   Plan: Will ask Dr. Sherrie Sport to share gaps in care information that is not up to date in Epic: (Hep C screening, last Tdap, pneumonia vaccine, COVID booster date, colonoscopy. Pt appears to need primary care appt. Last in Epic chart was in October and pt has had 5 ED  visits and 2 resulting in admissions since that last OV.  Pt, wife and I will talk again by phone next week. Have encouraged them to NOT hesitate to call me or Dr. Sherrie Sport or Dr. Melvyn Novas at the first sign of breathing difficulty for support and instructions. Encouraged following ACTION PLAN given by Dr. Melvyn Novas.   Follow-up:  Patient agrees to Care Plan and Follow-up.  Eulah Pont. Myrtie Neither, MSN, [facility] Gerontological Nurse Practitioner [facility] Care Management [phone]

## 2020-07-29 ENCOUNTER — Other Ambulatory Visit: Payer: Self-pay

## 2020-07-29 ENCOUNTER — Other Ambulatory Visit: Payer: Self-pay | Admitting: *Deleted

## 2020-07-29 NOTE — Patient Outreach (Signed)
[facility]) Care Management  [facility] Care Manager  07/29/2020   Jeffery [name] Sep 02, 1950 [id number]  Subjective: Telephone outreach for COPD follow up.  Pt was able to go visit with his sister for the weekend without any respiratory problems. He really enjoyed that. [name] is sharing with the family about the trajectory of his disease and they need to spend time when they can with him. His older sister is in the hospital now with Stage IV Ca and she is not going to tell him right now. Will wait until she is discharged from the hospital.  Objective: N/A  Encounter Medications:  Outpatient Encounter Medications as of 07/29/2020  Medication Sig Note  . albuterol (PROAIR HFA) 108 (90 Base) MCG/ACT inhaler Inhale 1-2 puffs into the lungs every 4 (four) hours as needed. (Patient taking differently: Inhale 1-2 puffs into the lungs every 4 (four) hours as needed for wheezing or shortness of breath.)   . ALPRAZolam (XANAX) 0.5 MG tablet Take 0.5 mg by mouth 2 (two) times daily. Takes 1/2 tab in the morning and 1 tablet at bedtime   . aspirin EC 81 MG tablet Take 81 mg by mouth daily.   Marland Kitchen atorvastatin (LIPITOR) 10 MG tablet Take 1 tablet (10 mg total) by mouth at bedtime.   . Budeson-Glycopyrrol-Formoterol (BREZTRI AEROSPHERE) 160-9-4.8 MCG/ACT AERO Inhale into the lungs 2 (two) times daily.   . famotidine (PEPCID) 40 MG tablet 1 daily after supper (Patient taking differently: Take 40 mg by mouth 2 (two) times daily.)   . Fluticasone-Umeclidin-Vilant (TRELEGY ELLIPTA) 100-62.5-25 MCG/INH AEPB One click each am (Patient not taking: No sig reported)   . furosemide (LASIX) 20 MG tablet Take 10 mg by mouth daily.   Marland Kitchen gabapentin (NEURONTIN) 300 MG capsule Take 300 mg by mouth 3 (three) times daily.    . isosorbide mononitrate (IMDUR) 30 MG 24 hr tablet Take 30 mg by mouth daily. (Patient not taking: No sig reported) 07/09/2020: Stopped in hospital.  . MATZIM LA 240 MG 24 hr tablet  Take 240 mg by mouth daily.   . metFORMIN (GLUCOPHAGE) 500 MG tablet Take 500 mg by mouth daily.   . mirtazapine (REMERON) 15 MG tablet Take 7.5 mg by mouth at bedtime.   . Multiple Vitamin (MULTIVITAMIN WITH MINERALS) TABS tablet Take 1 tablet by mouth daily.   Marland Kitchen omeprazole (PRILOSEC) 20 MG capsule Take 20 mg by mouth daily.   . OXYGEN 2lpm 24/7   . polyethylene glycol (MIRALAX / GLYCOLAX) 17 g packet Take 17 g by mouth daily.   . potassium chloride SA (K-DUR,KLOR-CON) 20 MEQ tablet Take 20 mEq by mouth daily.    . predniSONE (DELTASONE) 20 MG tablet Take 3 tablets by mouth daily x1 day; then 2 tablet by mouth daily x2 days; then 1 tablet by mouth daily x3 days; then half tablet by mouth daily x3 days and then resume daily 5 mg tablet after that.   . predniSONE (DELTASONE) 5 MG tablet Take 1 tablet (5 mg total) by mouth daily with breakfast. Start taking it again after completion of acute tapering prescription.   . senna (SENOKOT) 8.6 MG TABS tablet Take 1 tablet by mouth daily as needed for mild constipation.   . sucralfate (CARAFATE) 1 g tablet Take 1 g by mouth 2 (two) times daily.  07/09/2020: Takes this qid.  Marland Kitchen telmisartan (MICARDIS) 40 MG tablet Take 1 tablet (40 mg total) by mouth daily.    No facility-administered encounter medications  on file as of 07/29/2020.     PHQ 2/9 Scores 07/22/2020 07/09/2020 07/09/2018  PHQ - 2 Score 2 2 -  PHQ- 9 Score 3 3 -  Exception Documentation - - Other- indicate reason in comment box  Not completed - - call completed with spouse    Assessment: End Stage COPD                        Nutritional malnutrition and low wt                        Grief related to recognition he has a disease that is life limiting.  Goals Addressed              This Visit's Progress     Patient Stated   .  Will begin drinking at least one nutritional supplement daily over the next month per pt & wife report. (pt-stated)        Start date: 07/09/20 Short term  goal Priority High Expected end date 08/14/20  Notes: 07/09/20 Pt is not drinking a supplement at present. Has lost 5# in last 3 months. Current wt is 105#, Ht 5'6" BMI 17. NP to send coupons. Discussed pt calorie needs with his severe COPD and need to be takinig in more nutrition. Advised to eat 3 meals and drink the shake in addition not in place of. 07/22/20 Pt wt is 106# today, BMI of 17. Will send nutritional supplement coupons, drink one or 2 per day in addition to regular meals.He does report he has a good appetite when his breathing is good. 07/29/20 Has not received coupons to date. Should arrive this week. Again encouraged 1-2 in addition to meals.Would like to see pt reach a wt of 110# over next 2 months. He has always been thin and his top weight was 120 when he was younger.      Other   .  Pt and wife will complete HCPOA and Living Will within the next 30 days per report.        Start date: 07/09/20 Short term goal Medium priority Expected end date: 08/14/20  Notes: 07/09/20 Discussed need to complete these documents. Pt and wife are willing to do so. 07/17/20 Pt and wife received the documents. Discussed completion at home except for signature page and take to notary to witness signature and make copies. Give to MD, keep one and give a copy to #2 person named as secondary HCPOA. 07/22/20 Discussed detail of HCPOA and Living Will also discussed MOST form. Educated on CPR success rate and poor outcomes in those with chronic illness. Requested that they complete these documents as soon as possible. 07/29/20 Spoke to Mrs. Ohern who says they have not completed the documents yet. Pt is still working on accepting his likely decline towards the end of life.    .  Track and Manage My Symptoms-COPD as evidenced by pt report on each encounter.        Timeframe:  Long-Range Goal Priority:  High Start Date:    07/09/20                         Expected End Date:    10/29/20                   Follow Up  Date  08/14/20   - develop a rescue plan - follow  rescue plan if symptoms flare-up    Why is this important?    Tracking your symptoms and other information about your health helps your doctor plan your care.   Write down the symptoms, the time of day, what you were doing and what medicine you are taking.   You will soon learn how to manage your symptoms.     Notes 07/09/20 Patient is knowledgable about his COPD Action Plan from previous involvement with [facility]. Needs reinforcement and encouragement to call for assist early. 07/17/20 Pt had another ED visit. Home pulse Ox reading 84% and pt anxious and SOB. Respiratory medication regimen reinforced. Advised to take nebulizer prn if having wheezing and rattling in his chest so he can cough out the mucous and feel more comfortable. Also he can take an extra alprazolam when he is starting the cycle of some SOB, mounting anxiety, more SOB, etc. Will visit pt at home and go over sx relieving methods, pursed lipped breathing. 07/22/20 Reviewed specific COPD Action Plan in detail per pulmonolgist, Dr. Sherene Sires. Put plan in pt [facility] calendar for quick access. Pt and wife are familiar and know what to do. Encouraged pt to take additional alprazolam 0.5 if he begins to feel anxious with increasing sxs to relieve the cycle. 07/29/20 Pt sounds very good today. He can say a whole sentence without taking a breath, voice is very clear. Has been very keen on doing his pulmonary routine.        Plan: Will call again in 2 weeks. Advised to call me before if they need assistance or to ask questions or if they feel they need a home visit. Will ask about willingness to have palliative care consult.  Follow-up:  Patient agrees to Care Plan and Follow-up.   Zara Council. Burgess Estelle, MSN, [facility] Gerontological Nurse Practitioner [facility] Care Management [phone]

## 2020-08-01 DIAGNOSIS — J449 Chronic obstructive pulmonary disease, unspecified: Secondary | ICD-10-CM | POA: Diagnosis not present

## 2020-08-01 DIAGNOSIS — J9611 Chronic respiratory failure with hypoxia: Secondary | ICD-10-CM | POA: Diagnosis not present

## 2020-08-12 ENCOUNTER — Other Ambulatory Visit: Payer: Self-pay | Admitting: *Deleted

## 2020-08-12 ENCOUNTER — Other Ambulatory Visit: Payer: Self-pay

## 2020-08-12 NOTE — Patient Outreach (Signed)
[facility]) Care Management  [facility] Care Manager  08/12/2020   Jeffery PETT 1950-11-05 [id number]  Subjective: Telephone outreach for f/u COPD  Encounter Medications:  Outpatient Encounter Medications as of 08/12/2020  Medication Sig Note  . albuterol (PROAIR HFA) 108 (90 Base) MCG/ACT inhaler Inhale 1-2 puffs into the lungs every 4 (four) hours as needed. (Patient taking differently: Inhale 1-2 puffs into the lungs every 4 (four) hours as needed for wheezing or shortness of breath.)   . ALPRAZolam (XANAX) 0.5 MG tablet Take 0.5 mg by mouth 2 (two) times daily. Takes 1/2 tab in the morning and 1 tablet at bedtime   . aspirin EC 81 MG tablet Take 81 mg by mouth daily.   Marland Kitchen atorvastatin (LIPITOR) 10 MG tablet Take 1 tablet (10 mg total) by mouth at bedtime.   . Budeson-Glycopyrrol-Formoterol (BREZTRI AEROSPHERE) 160-9-4.8 MCG/ACT AERO Inhale into the lungs 2 (two) times daily.   . famotidine (PEPCID) 40 MG tablet 1 daily after supper (Patient taking differently: Take 40 mg by mouth 2 (two) times daily.)   . Fluticasone-Umeclidin-Vilant (TRELEGY ELLIPTA) 100-62.5-25 MCG/INH AEPB One click each am (Patient not taking: No sig reported)   . furosemide (LASIX) 20 MG tablet Take 10 mg by mouth daily.   Marland Kitchen gabapentin (NEURONTIN) 300 MG capsule Take 300 mg by mouth 3 (three) times daily.    . isosorbide mononitrate (IMDUR) 30 MG 24 hr tablet Take 30 mg by mouth daily. (Patient not taking: No sig reported) 07/09/2020: Stopped in hospital.  . MATZIM LA 240 MG 24 hr tablet Take 240 mg by mouth daily.   . metFORMIN (GLUCOPHAGE) 500 MG tablet Take 500 mg by mouth daily.   . mirtazapine (REMERON) 15 MG tablet Take 7.5 mg by mouth at bedtime.   . Multiple Vitamin (MULTIVITAMIN WITH MINERALS) TABS tablet Take 1 tablet by mouth daily.   Marland Kitchen omeprazole (PRILOSEC) 20 MG capsule Take 20 mg by mouth daily.   . OXYGEN 2lpm 24/7   . polyethylene glycol (MIRALAX / GLYCOLAX) 17 g packet Take 17 g by  mouth daily.   . potassium chloride SA (K-DUR,KLOR-CON) 20 MEQ tablet Take 20 mEq by mouth daily.    . predniSONE (DELTASONE) 20 MG tablet Take 3 tablets by mouth daily x1 day; then 2 tablet by mouth daily x2 days; then 1 tablet by mouth daily x3 days; then half tablet by mouth daily x3 days and then resume daily 5 mg tablet after that.   . predniSONE (DELTASONE) 5 MG tablet Take 1 tablet (5 mg total) by mouth daily with breakfast. Start taking it again after completion of acute tapering prescription.   . senna (SENOKOT) 8.6 MG TABS tablet Take 1 tablet by mouth daily as needed for mild constipation.   . sucralfate (CARAFATE) 1 g tablet Take 1 g by mouth 2 (two) times daily.  07/09/2020: Takes this qid.  Marland Kitchen telmisartan (MICARDIS) 40 MG tablet Take 1 tablet (40 mg total) by mouth daily.    No facility-administered encounter medications on file as of 08/12/2020.    Functional Status:  In your present state of health, do you have any difficulty performing the following activities: 07/22/2020 07/09/2020  Hearing? - N  Vision? - N  Difficulty concentrating or making decisions? - N  Walking or climbing stairs? - Y  Dressing or bathing? - Y  Comment - -  Doing errands, shopping? - Y  Comment - -  Preparing Food and eating ? N N  Using the Toilet? N N  In the past six months, have you accidently leaked urine? N N  Do you have problems with loss of bowel control? N N  Managing your Medications? N N  Managing your Finances? N N  Housekeeping or managing your Housekeeping? Y Y  Some recent data might be hidden    Fall/Depression Screening: Fall Risk  07/22/2020 07/09/2020 07/09/2018  Falls in the past year? 0 0 0  Number falls in past yr: 0 0 0  Injury with Fall? 0 0 0  Risk for fall due to : Medication side effect Medication side effect -  Follow up Falls evaluation completed Falls evaluation completed -   PHQ 2/9 Scores 07/29/2020 07/22/2020 07/09/2020 07/09/2018  PHQ - 2 Score 3 2 2  -  PHQ- 9  Score 5 3 3  -  Exception Documentation - - - Other- indicate reason in comment box  Not completed - - - call completed with spouse    Assessment: End stage COPD - improved control                        Low weight - gained 2#  Goals Addressed              This Visit's Progress     Patient Stated   .  COMPLETED: Will begin drinking at least one nutritional supplement daily over the next month per pt & wife report. (pt-stated)   On track     Start date: 07/09/20 Short term goal Priority High Expected end date 08/14/20  Notes: 07/09/20 Pt is not drinking a supplement at present. Has lost 5# in last 3 months. Current wt is 105#, Ht 5'6" BMI 17. NP to send coupons. Discussed pt calorie needs with his severe COPD and need to be takinig in more nutrition. Advised to eat 3 meals and drink the shake in addition not in place of. 07/22/20 Pt wt is 106# today, BMI of 17. Will send nutritional supplement coupons, drink one or 2 per day in addition to regular meals.He does report he has a good appetite when his breathing is good. 07/29/20 Has not received coupons to date. Should arrive this week. Again encouraged 1-2 in addition to meals.Would like to see pt reach a wt of 110# over next 2 months. He has always been thin and his top weight was 120 when he was younger. 08/12/20 Pt received his coupons and is drinking one nutritional supplement daily. He has gained 3# since we set the goal for him to put on some wt! He is also eating well.      Other   .  Patient Stated        Goal: Pt will  continue to supplement his diet with a nutritional drink daily with wt gain goal up to 110#. Current wt is 108# Today.  Start date 08/12/20 Short term goal Expected end date 09/20/20      .  Pt and wife will complete HCPOA and Living Will within the next 30 days per report.   Not on track     Start date: 07/09/20 Short term goal Medium priority Expected end date: 09/20/20  Notes: 07/09/20 Discussed need to  complete these documents. Pt and wife are willing to do so. 07/17/20 Pt and wife received the documents. Discussed completion at home except for signature page and take to notary to witness signature and make copies. Give to MD, keep one and give  a copy to #2 person named as secondary HCPOA. 07/22/20 Discussed detail of HCPOA and Living Will also discussed MOST form. Educated on CPR success rate and poor outcomes in those with chronic illness. Requested that they complete these documents as soon as possible. 07/29/20 Spoke to Mrs. Lieske who says they have not completed the documents yet. Pt is still working on accepting his likely decline towards the end of life. 08/12/20 Have docs but not completed. They acknowledged that they know they need to do these and will do them before our home visit in May.    .  Track and Manage My Symptoms-COPD as evidenced by pt report on each encounter.   On track     Timeframe:  Long-Range Goal Priority:  High Start Date:    07/09/20                         Expected End Date:    10/29/20                   Follow Up Date  09/20/20    Notes 07/09/20 Patient is knowledgable about his COPD Action Plan from previous involvement with [facility] Health Coach. Needs reinforcement and encouragement to call for assist early. 07/17/20 Pt had another ED visit. Home pulse Ox reading 84% and pt anxious and SOB. Respiratory medication regimen reinforced. Advised to take nebulizer prn if having wheezing and rattling in his chest so he can cough out the mucous and feel more comfortable. Also he can take an extra alprazolam when he is starting the cycle of some SOB, mounting anxiety, more SOB, etc. Will visit pt at home and go over sx relieving methods, pursed lipped breathing. 07/22/20 Reviewed specific COPD Action Plan in detail per pulmonolgist, Dr. Sherene Sires. Put plan in pt [facility] calendar for quick access. Pt and wife are familiar and know what to do. Encouraged pt to take additional alprazolam 0.5 if he  begins to feel anxious with increasing sxs to relieve the cycle. 07/29/20 Pt sounds very good today. He can say a whole sentence without taking a breath, voice is very clear. Has been very keen on doing his pulmonary routine. 08/12/20 Pt self management is very good, no exacerbations since our last contact. Able to recite his respiratory medication regimen and what to do if he has problems. He has been able to walk outdoors some and is enjoying that. We discussed keeping cool, avoid opening windows, wearing a mask, watching the weather report for AIR QUALITY and avoiding going out in ORANGE or RED conditions.       Plan:  Reviewed discussion regarding referral to Palliative Care and Mrs. Blaze would like to have a consult. She feels it is better to get established so if Mr. Breach condition does decline the service will be familiar with him and be able to make a smooth transition to Hospice when necessary.  Made PC referral  We agreed to have a home visit next month.  Follow-up:  Patient agrees to Care Plan and Follow-up.   Zara Council. Burgess Estelle, MSN, [facility] Gerontological Nurse Practitioner [facility] Care Management [phone]

## 2020-08-20 DIAGNOSIS — K5901 Slow transit constipation: Secondary | ICD-10-CM | POA: Diagnosis not present

## 2020-08-20 DIAGNOSIS — J449 Chronic obstructive pulmonary disease, unspecified: Secondary | ICD-10-CM | POA: Diagnosis not present

## 2020-08-20 DIAGNOSIS — Z515 Encounter for palliative care: Secondary | ICD-10-CM | POA: Diagnosis not present

## 2020-08-24 ENCOUNTER — Telehealth: Payer: Self-pay | Admitting: Internal Medicine

## 2020-08-25 NOTE — Telephone Encounter (Signed)
Nothing noted in message. Will close encounter.  

## 2020-08-26 ENCOUNTER — Telehealth: Payer: Self-pay | Admitting: Internal Medicine

## 2020-08-26 DIAGNOSIS — Z515 Encounter for palliative care: Secondary | ICD-10-CM | POA: Diagnosis not present

## 2020-08-26 DIAGNOSIS — J449 Chronic obstructive pulmonary disease, unspecified: Secondary | ICD-10-CM | POA: Diagnosis not present

## 2020-08-26 DIAGNOSIS — K5901 Slow transit constipation: Secondary | ICD-10-CM | POA: Diagnosis not present

## 2020-08-26 NOTE — Telephone Encounter (Signed)
Jeffery Loser NP from [facility]  returned phone call and was updated on Dr Thurston Hole response:   Jeffery Cowden, MD 34 minutes ago (3:58 PM)       pfts not needed if shifting to comfort care      Called and left message with respiratory Therapy to cancel PFT appointment on 09/01/20. Patient will be shifting over to hospice care and per [facility] NP they will be communicating with PCP. Will route to Dr Sherene Sires as Lorain Childes. Called and spoke with patient to make him aware. Patient asked writer also call wife, Jeffery [name] to update. Attempted to call and left message on voicemail to return phone call. Contact number provided.

## 2020-08-26 NOTE — Telephone Encounter (Signed)
Spoke with Bjorn Loser NP from [facility] who states patient has agreed to transition to Hospice and Bjorn Loser would like to know if the PFT that Dr Sherene Sires ordered is necessary at this point, if so then hospice will wait to admit until after PFT is complete. Patient has PFT scheduled to be done on 09/01/2020.  Sarah please advise .

## 2020-08-26 NOTE — Telephone Encounter (Signed)
pfts not needed if shifting to comfort care

## 2020-08-26 NOTE — Telephone Encounter (Signed)
Attempted to call Bjorn Loser NP with Dr Thurston Hole response and left voicemail to please return phone call. Contact number provided.

## 2020-08-26 NOTE — Telephone Encounter (Signed)
Bevelyn Ngo, NP  Melonie Florida, RN 45 minutes ago (1:36 PM)     I would say PFT is not needed, but Dr. Sherene Sires needs to answer this since he ordered it. Please route to Dr. Sherene Sires. Thanks   Message text

## 2020-08-27 NOTE — Telephone Encounter (Signed)
Spoke with Bjorn Loser NP from [facility] and confirmed that due to patient will be shifting to comfort care that patient's wife are aware that PFT appointment will be canceled. Bjorn Loser stated she has spoken with Octavio Graves and she is aware. Nothing further needed at this time

## 2020-08-31 ENCOUNTER — Other Ambulatory Visit (HOSPITAL_COMMUNITY): Payer: Medicare Other

## 2020-08-31 DIAGNOSIS — J9611 Chronic respiratory failure with hypoxia: Secondary | ICD-10-CM | POA: Diagnosis not present

## 2020-08-31 DIAGNOSIS — J449 Chronic obstructive pulmonary disease, unspecified: Secondary | ICD-10-CM | POA: Diagnosis not present

## 2020-09-01 ENCOUNTER — Ambulatory Visit (HOSPITAL_COMMUNITY): Payer: Medicare Other

## 2020-09-09 DIAGNOSIS — Z0000 Encounter for general adult medical examination without abnormal findings: Secondary | ICD-10-CM | POA: Diagnosis not present

## 2020-09-09 DIAGNOSIS — K5900 Constipation, unspecified: Secondary | ICD-10-CM | POA: Diagnosis not present

## 2020-09-09 DIAGNOSIS — J440 Chronic obstructive pulmonary disease with acute lower respiratory infection: Secondary | ICD-10-CM | POA: Diagnosis not present

## 2020-09-09 DIAGNOSIS — E1143 Type 2 diabetes mellitus with diabetic autonomic (poly)neuropathy: Secondary | ICD-10-CM | POA: Diagnosis not present

## 2020-09-09 DIAGNOSIS — Z681 Body mass index (BMI) 19 or less, adult: Secondary | ICD-10-CM | POA: Diagnosis not present

## 2020-09-14 ENCOUNTER — Other Ambulatory Visit: Payer: Self-pay | Admitting: *Deleted

## 2020-09-14 NOTE — Patient Outreach (Signed)
[facility]) Care Management  09/14/2020  Jeffery [name] [birth year] [id number]  Message sent to [name] to notify her that I am not able to see them today due to illness. Advised that I am aware [name] is now on Hospice service and that I will close his case.  Zara Council. Burgess Estelle, MSN, [facility] Gerontological Nurse Practitioner [facility] Care Management [phone]

## 2020-09-15 ENCOUNTER — Other Ambulatory Visit: Payer: Self-pay

## 2020-09-15 ENCOUNTER — Ambulatory Visit: Payer: Medicare Other | Admitting: Internal Medicine

## 2020-09-15 ENCOUNTER — Encounter: Payer: Self-pay | Admitting: Internal Medicine

## 2020-09-15 DIAGNOSIS — J449 Chronic obstructive pulmonary disease, unspecified: Secondary | ICD-10-CM | POA: Diagnosis not present

## 2020-09-15 DIAGNOSIS — J9611 Chronic respiratory failure with hypoxia: Secondary | ICD-10-CM | POA: Diagnosis not present

## 2020-09-15 MED ORDER — ALBUTEROL SULFATE (2.5 MG/3ML) 0.083% IN NEBU: 2.5000 mg | INHALATION_SOLUTION | RESPIRATORY_TRACT | 12 refills | Status: AC | PRN

## 2020-09-15 NOTE — Progress Notes (Signed)
Jeffery [name], male    DOB: 09-02-1950,    MRN: [id number]   Brief patient profile:  32  yobm quit smoking 05/2013 with onset of doe around 2013 and at the point he quit smoking already on neb qid and then placed on variable inhalers and 02 but does not recall ever having  pfts  Seen in ER x 6 x in 2020 so referred to pulmonary clinic 10/03/2018 by Dr   Jeffery [name] steroid dep x 2019.    History of Present Illness  10/03/2018  Pulmonary/ 1st office eval/Jeffery [name]  On ACEi/dpi's including spiriva  Chief Complaint  Patient presents with  . Consult    Consult WP:[id number]. States his breathing has its good and bad days. States he has been on oxygen for about 2 years and also uses at night.   Dyspnea:  Much better p ER rx x 6  But only temporarily while  On pred 40 mg then back to 5 mg and flares w/in a week  Cough: dry/choking sensation/ worse when use inhalers esp dpi's Sleep: prone / bed is flat SABA use: multiple and not helpful  02 2lpm hs  rec Take omeprazole 20 mg x 2  About 30-60 before your first meal Take famotidine 40 mg after supper or before bed  Stop all inhalers and just use the nebulizer = duoneb ipratropium /albuterol breafast lunch supper and bedtime automatically. Only use your albuterol nebulizer as a rescue medication  Stop benazapril and start  micardis (telmisartan) 40 mg one daily in its place and call me if there is a problem  Prednisone 20 mg this evening,  Then 20 mg x 2 days, 10 mg x 2 days then stay on 5 mg daily but if get worse go back to 20 mg each am and start over   10/31/2018  f/u ov/Jeffery [name] re:  Copd/ ? acei contributing to some of his symptoms?  Chief Complaint  Patient presents with  . Follow-up    Breathing is doing well today. He states his throat feels dry.    Dyspnea:  Walked to stop sign and back s stopping x two = 250 ft? Cough: better / no longer choking but throat still doesn't feel quite right  Sleeping: ok p xanax  SABA use: avg 2-3 daily  02:  2lpm NP  24/7  rec Reduce your telmisartan to 40 mg one half a pill daily  Duoneb=  ipratropium /albuterol breafast lunch supper and bedtime automatically. As a backup >>> Only use your albuterol nebulizer as a rescue medication to be used if you can't catch your breath by resting or doing a relaxed purse lip breathing pattern.  - The less you use it, the better it will work when you need it. - Ok to use up to  every 3  hours if you must but call for immediate appointment if use goes up over your usual need or resume prednisone at 20 mg   Prednisone is 20 mg per day  Until  (5mg  x 4 pills) until better then taper to 5 mg  - if worse go back up  Please schedule a follow up office visit in 4 weeks, sooner if needed  with all medications /inhalers/ solutions in hand so we can verify exactly what you are taking. This includes all medications from all doctors and over the counters Add: consider adding budesonide to neb next ov if can't reduce      11/28/2018  f/u ov/Jeffery [name] re: COPD /  f/u off acei improved overall - using duoneb qid as   maint rx plus saba hfa "when over does it" then makes him shaky   Chief Complaint  Patient presents with  . Follow-up    Breathing is "so so". He is c/o shakiness and feels like his legs give out too easily. He is using his albuterol inhaler 2 x daily and Duoneb 4 x daily.   Dyspnea:  Walks to stop sign and back on 2lpm Cough: no Sleeping: ok prone flat bed  SABA use: as above 02: 2l  Lpm24/7  rec No change rx     04/29/2019  f/u ov/Jeffery [name] re: Severe copd/ 02 dep / easily confused re details of care  Chief Complaint  Patient presents with  . Follow-up    Breathing is unchanged.   Dyspnea:  No longer walking outside home, doe x room to room  Cough: minimally congested, white mucus esp in am Sleeping: ok flat one pillow SABA use: much less  02: 2lpm 24/7  rec Plan A = Automatic = Always=    budsonide / ipatropium-albuterol first thing in am in nebulizer and again  12 hours  Plan B = Backup (to supplement plan A, not to replace it) Only use your albuterol inhaler(PROAIR /red)  as a rescue medication   Plan C = Crisis (instead of Plan B but only if Plan B stops working) - only use your ipatropium/albuterol nebulizer if you first try Plan B and it fails to help > ok to use the nebulizer up to every 4 hours but if start needing it regularly call for immediate appointment Plan D = Deltasone = Prednisone  Prednisone is 20 mg per day  Until  (5mg  x 4 pills) until better then taper to 5 mg  - if worse go back up      10/18/2019  f/u ov/Jeffery [name] re: severe copd /taking salt supplements with newly reported leg swelling/ had both shots  Chief Complaint  Patient presents with  . Follow-up    Breathing is unchanged since the last visit. He is using his albuterol inhaler 2-3 x per day and neb with albuterol 2-3 x per day.   Dyspnea: room to room  Cough: none  Sleeping: one pillow bed is flat  SABA use: as above using neb only and very easily confused with details of care.  02: 2lpm 24/ 7  rec Plan A = Automatic = Always=    Trelegy 100 one click each am when feet hit the floor - take two good drags then brush teeth and tongue with arm and hammer toothpaste Stop the budesonide  Plan B = Backup (to supplement plan A, not to replace it) Only use your albuterol (proair)  inhaler as a rescue medication Plan C = Crisis (instead of Plan B but only if Plan B stops working) - only use your albuterol nebulizer if you first try Plan B and it fails to help > ok to use the nebulizer up to every 4 hours but if start needing it regularly call for immediate appointment Plan D = Deltasone 5 mg  4 daily until better then wean to one  Please schedule a follow up office visit in 6 weeks, sooner if needed      11/25/2019  f/u ov/Jeffery [name] office/Jeffery [name] re:  GOLD ? / 02 dep trelegy / pred dep/02 dep  Chief Complaint  Patient presents with  . Follow-up    Breathing is overall doing  well. He has  not used his rescue inhaler or neb.   Dyspnea:  Walking neighborhood now up to 15 min Cough: rarely Sleeping: bed is flat one pillow SABA use: none  02: 2lpm 24/7  rec  no change rx/ check 02 sats  03/16/2020  f/u ov/Prestyn Stanco re: GOLD ?  maint on trelegy  Chief Complaint  Patient presents with  . Follow-up    shortness of breath with activity  Dyspnea:  Walking neighborhood 10-15 min on 3lpm POC  Cough: none  Sleeping: bed is flat/ one pillow  SABA use: rarely but has proair and neb 02: 2lpm 2/4  And 3lpm walking  but not cheicking sats rec Plan A = Automatic = Always=    Trelegy one click each am Plan B = Backup (to supplement plan A, not to replace it) Only use your albuterol inhaler as a rescue medication Plan C = Crisis (instead of Plan B but only if Plan B stops working) - only use your albuterol nebulizer if you first try Plan B and it fails to help  Plan D = Deltasone = prednisone  4 daily until better then wean to one half if feel great  Make sure you check your oxygen saturations at highest level of activity to be sure it stays over 90% and adjust  02 flow upward to maintain this level if needed but remember to turn it back to previous settings when you stop (to conserve your supply).    06/23/2020  f/u ov/[place] office/Bronwen Pendergraft re: prednisone 5 mg daily  Chief Complaint  Patient presents with  . Follow-up    Breathing has been worse over the past wk. He is getting winded walking from room to room at home. He is not using his albuterol inhaler.  Dyspnea:  Variable/ not clear understood the ABCD plan as 2 recent ER trips due to sob. Cough: none now Sleeping: able to lie flat bed/ one pillow  SABA use: none 02: 2lpm except 3lpm walking  Covid status: vax x 3  rec Plan A = Automatic = Always=    Trelegy one click each am Plan B = Backup (to supplement plan A, not to replace it) Only use your albuterol inhaler as a rescue medication  Plan C = Crisis (instead  of Plan B but only if Plan B stops working) - only use your albuterol nebulizer if you first try Plan B and it fails to help Plan D = Deltasone = prednisone  4 daily until ALL better then wean to one half if feel great   Make sure you check your oxygen saturations at highest level of activity  Please schedule a follow up visit in 3 months but call sooner if needed  - PFTs on return and bring all inhalers/ solutions   09/15/2020  f/u ov/[place] office/Areeba Sulser re: endstage copd/ now on hospice  Chief Complaint  Patient presents with  . Follow-up    Breathing is unchanged. He is concerned about elevated heart rate with minimal exertion. He states on hospice care now, using morphine sulfate for dyspnea about 2-3 x per day and this has helped. He is in on pred 5 mg daily.    Dyspnea:  50 ft  Cough: none  Sleeping: flat bed one pillow SABA use: neb qid not prn  02: 2lpm 24/7 not titrating Covid status: vax x 2 Lung cancer screening: n/a  hospice   No obvious day to day or daytime variability or assoc excess/ purulent sputum or mucus plugs or  hemoptysis or cp or chest tightness, subjective wheeze or overt sinus or hb symptoms.   Sleeping  without nocturnal  or early am exacerbation  of respiratory  c/o's or need for noct saba. Also denies any obvious fluctuation of symptoms with weather or environmental changes or other aggravating or alleviating factors except as outlined above   No unusual exposure hx or h/o childhood pna/ asthma or knowledge of premature birth.  Current Allergies, Complete Past Medical History, Past Surgical History, Family History, and Social History were reviewed in [facility] electronic medical record.  ROS  The following are not active complaints unless bolded Hoarseness, sore throat, dysphagia, dental problems, itching, sneezing,  nasal congestion or discharge of excess mucus or purulent secretions, ear ache,   fever, chills, sweats, unintended wt loss or wt gain,  classically pleuritic or exertional cp,  orthopnea pnd or arm/hand swelling  or leg swelling, presyncope, palpitations, abdominal pain, anorexia, nausea, vomiting, diarrhea  or change in bowel habits or change in bladder habits, change in stools or change in urine, dysuria, hematuria,  rash, arthralgias, visual complaints, headache, numbness, weakness or ataxia or problems with walking or coordination,  change in mood or  memory.        Current Meds  Medication Sig  . albuterol (PROAIR HFA) 108 (90 Base) MCG/ACT inhaler Inhale 1-2 puffs into the lungs every 4 (four) hours as needed. (Patient taking differently: Inhale 1-2 puffs into the lungs every 4 (four) hours as needed for wheezing or shortness of breath.)  . ALPRAZolam (XANAX) 0.5 MG tablet Take 0.5 mg by mouth 2 (two) times daily. Takes 1/2 tab in the morning and 1 tablet at bedtime  . aspirin EC 81 MG tablet Take 81 mg by mouth daily.  Marland Kitchen. atorvastatin (LIPITOR) 10 MG tablet Take 1 tablet (10 mg total) by mouth at bedtime.  . Budeson-Glycopyrrol-Formoterol (BREZTRI AEROSPHERE) 160-9-4.8 MCG/ACT AERO Inhale into the lungs 2 (two) times daily.  . famotidine (PEPCID) 40 MG tablet 1 daily after supper (Patient taking differently: Take 40 mg by mouth 2 (two) times daily.)  . Fluticasone-Umeclidin-Vilant (TRELEGY ELLIPTA) 100-62.5-25 MCG/INH AEPB One click each am  . furosemide (LASIX) 20 MG tablet Take 10 mg by mouth daily.  Marland Kitchen. gabapentin (NEURONTIN) 300 MG capsule Take 300 mg by mouth 3 (three) times daily.   . isosorbide mononitrate (IMDUR) 30 MG 24 hr tablet Take 30 mg by mouth daily.  Marland Kitchen. linaclotide (LINZESS) 145 MCG CAPS capsule Take 145 mcg by mouth daily before breakfast.  . MATZIM LA 240 MG 24 hr tablet Take 240 mg by mouth daily.  . metFORMIN (GLUCOPHAGE) 500 MG tablet Take 500 mg by mouth daily.  . mirtazapine (REMERON) 15 MG tablet Take 7.5 mg by mouth at bedtime.  Marland Kitchen. morphine 20 MG/5ML solution Take 0.1-0.25 mg by mouth every 2 (two)  hours as needed for pain.  . Multiple Vitamin (MULTIVITAMIN WITH MINERALS) TABS tablet Take 1 tablet by mouth daily.  Marland Kitchen. omeprazole (PRILOSEC) 20 MG capsule Take 20 mg by mouth daily.  . ondansetron (ZOFRAN) 4 MG tablet Take 4 mg by mouth every 8 (eight) hours as needed for nausea or vomiting.  . OXYGEN 2lpm 24/7  . polyethylene glycol (MIRALAX / GLYCOLAX) 17 g packet Take 17 g by mouth daily.  . potassium chloride SA (K-DUR,KLOR-CON) 20 MEQ tablet Take 20 mEq by mouth daily.   . predniSONE (DELTASONE) 5 MG tablet Take 1 tablet (5 mg total) by mouth daily with breakfast. Start taking  it again after completion of acute tapering prescription.  . senna (SENOKOT) 8.6 MG TABS tablet Take 1 tablet by mouth daily as needed for mild constipation.  . sucralfate (CARAFATE) 1 g tablet Take 1 g by mouth 2 (two) times daily.   Marland Kitchen telmisartan (MICARDIS) 40 MG tablet Take 1 tablet (40 mg total) by mouth daily.                                    Past Medical History:  Diagnosis Date  . Chest pain 04/2011   Normal echocardiogram  . COPD (chronic obstructive pulmonary disease) (HCC)   . Fasting hyperglycemia    Normal hemoglobin A1c of 6.0  . GERD (gastroesophageal reflux disease)   . Hyperlipemia   . Hypertension   . On home O2    2L N/C  . On home O2    at night  . Prediabetes 06/04/2015  . Tobacco abuse          Objective:     09/15/2020       111 06/23/2020        121 03/16/2020      115  11/25/2019        113 10/18/2019        109 04/29/2019      110  11/28/2018        116   10/31/18 112 lb (50.8 kg)  10/03/18 114 lb 6.4 oz (51.9 kg)  09/30/18 116 lb (52.6 kg)     Vital signs reviewed  09/15/2020  - Note at rest 02 sats  100% on 2lpm   General appearance:    Thin amb bm / pseudowheeze better with plm    HEENT : pt wearing mask not removed for exam due to covid -19 concerns.    NECK :  without JVD/Nodes/TM/ nl carotid upstrokes bilaterally   LUNGS: no acc muscle use,   Mod barrel  contour chest wall with bilateral  Distant bs s audible wheeze and  without cough on insp or exp maneuvers and mod  Hyperresonant  to  percussion bilaterally     CV:  RRR  no s3 or murmur or increase in P2, and no edema   ABD:  soft and nontender with pos mid insp Hoover's  in the supine position. No bruits or organomegaly appreciated, bowel sounds nl  MS:     ext warm without deformities, calf tenderness, cyanosis or clubbing No obvious joint restrictions   SKIN: warm and dry without lesions    NEURO:  alert, approp, nl sensorium with  no motor or cerebellar deficits apparent.         I personally reviewed images and agree with radiology impression as follows:   Chest CT s contrast 07/13/20 1. Mildly motion degraded exam. 2. Right middle lobe scarring, without pulmonary parenchymal correlate for the plain film abnormality. No dominant pulmonary nodule or mass. 3. Aortic atherosclerosis (ICD10-I70.0), coronary artery atherosclerosis and emphysema (ICD10-J43.9). 4. Osteopenia with thoracolumbar compression deformities, the most significant at T7. Underlying T7 sclerosis is likely related to healing of compression fracture. If any history of primary malignancy, metastatic disease would be a consideration and bone scan suggested.         Assessment

## 2020-09-15 NOTE — Assessment & Plan Note (Signed)
Quit smoking  05/2013 and steroid dep since 2019  - 10/03/2018  After extensive coaching inhaler device,  effectiveness =    0% (can't use them) - d/c all inhalers x for neb 10/03/2018 and d/c ACEi as well  - 10/18/2019  After extensive coaching inhaler device,  effectiveness =    75% with elipta > try trelegy one click each am > improved 11/25/2019 - 03/16/2020  After extensive coaching inhaler device,  effectiveness =    75% with spacer  - 06/2020 placed on hospice/ pfts canceled   Clinically  Group D in terms of symptom/risk and laba/lama/ICS  therefore appropriate rx at this point >>>  Continue trelegy/ pred min dosing and ms for refractory sob but change neb to q 4 h prn sob at rest or prior to exertion

## 2020-09-15 NOTE — Patient Instructions (Signed)
Plan A = Automatic = Always=    Trelegy one click each am  Plan B = Backup (to supplement plan A, not to replace it) Only use your albuterol inhaler as a rescue medication to be used if you can't catch your breath by resting or doing a relaxed purse lip breathing pattern.  - The less you use it, the better it will work when you need it. - Ok to use the inhaler up to 2 puffs  every 4 hours if you must but call for appointment if use goes up over your usual need - Don't leave home without it !!  (think of it like the spare tire for your car)   Plan C = Crisis (instead of Plan B but only if Plan B stops working) - only use your albuterol nebulizer if you first try Plan B and it fails to help > ok to use the nebulizer up to every 4 hours but if start needing it regularly call for immediate appointment   Plan D = Deltasone = prednisone  4 daily until ALL better then wean to one half if feel great    Try albuterol 15 min before an activity (on alternating days)  that you know would make you short of breath and see if it makes any difference and if makes none then don't take albuterol after activity unless you can't catch your breath as this means it's the resting that helps, not the albuterol.  Pulmonary follow up is as needed

## 2020-09-15 NOTE — Assessment & Plan Note (Signed)
02 2lpm 24/7 as of 10/03/2018  -  10/18/2019   Walked RA  approx   300 ft  @ moderate to slow   pace  stopped due to  Sob  With sats still 92%            Each maintenance medication was reviewed in detail including emphasizing most importantly the difference between maintenance and prns and under what circumstances the prns are to be triggered using an action plan format where appropriate.  Total time for H and P, chart review, counseling, reviewing trelegy  device(s) and generating customized AVS unique to this office visit / same day charting =   24 min

## 2020-09-30 ENCOUNTER — Telehealth: Payer: Self-pay | Admitting: Internal Medicine

## 2020-09-30 NOTE — Telephone Encounter (Signed)
Received message from [facility] requesting Micardis 40mg  refill, but Patient is taking 1/2 tabs and needs 90 day supply to . Per LOV 09/15/20, with Dr. 09/17/20, Patient is taking Micardis 40mg  daily.  Last refill Micardis 40mg  daily, 06/23/20 for #30, with 3 refills. ATC Patient to clarify Micardis dosage and request.  LM for Patient to call back to clarify Micardis dosage.

## 2020-09-30 NOTE — Telephone Encounter (Signed)
[facility] calling on behalf of pt . pt is looking to get updated RX for micardis 40 mg. instead of 1 tablet a day , pt has been taken half a tablet. pt would like for new rx to reflect that. Pt also would like 90 day supply. [facility]  please advise pt  [phone] If any questions for [name] from [facility] [phone]  Ext [phone]

## 2020-10-04 IMAGING — DX DG CHEST 2V
3 series · 3 of 3 positions shown · non-contrast
Comparison: Multiple prior, most recent 06/07/2018

CLINICAL DATA: 67-year-old male with a history of shortness of
breath

EXAM:
CHEST - 2 VIEW

[chest pa (1 of 2)]
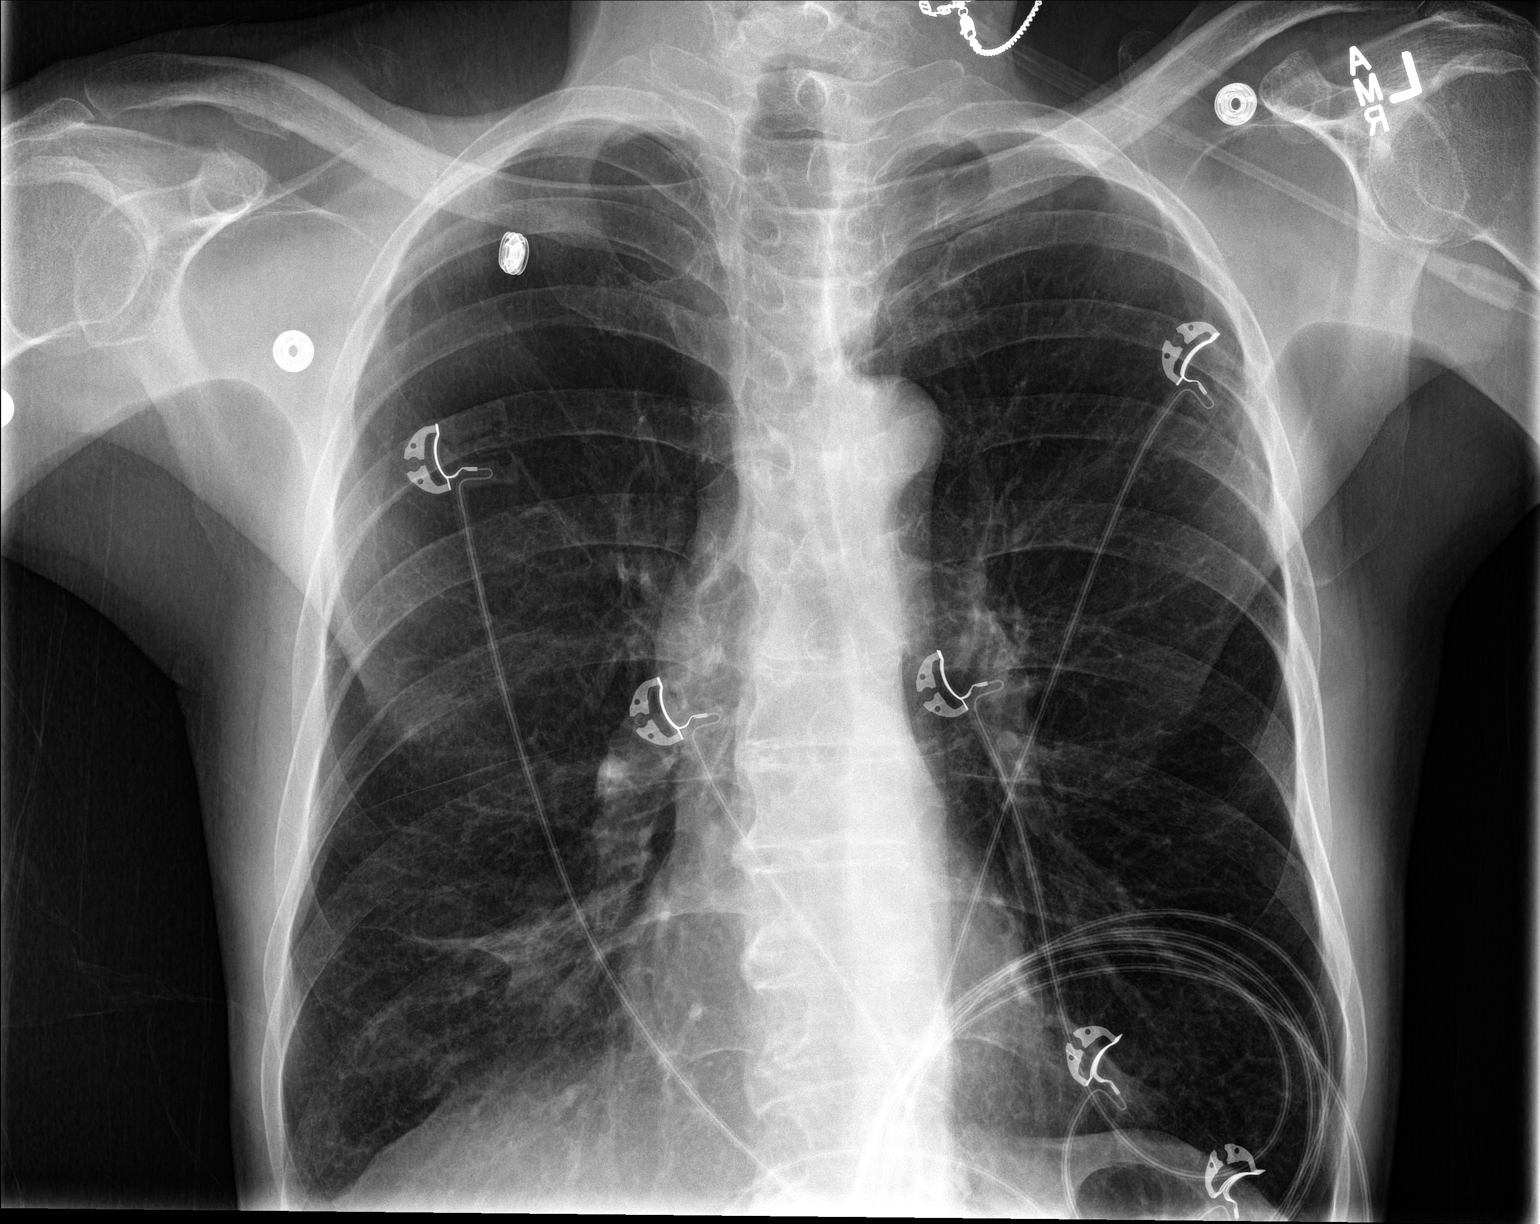

[chest lat]
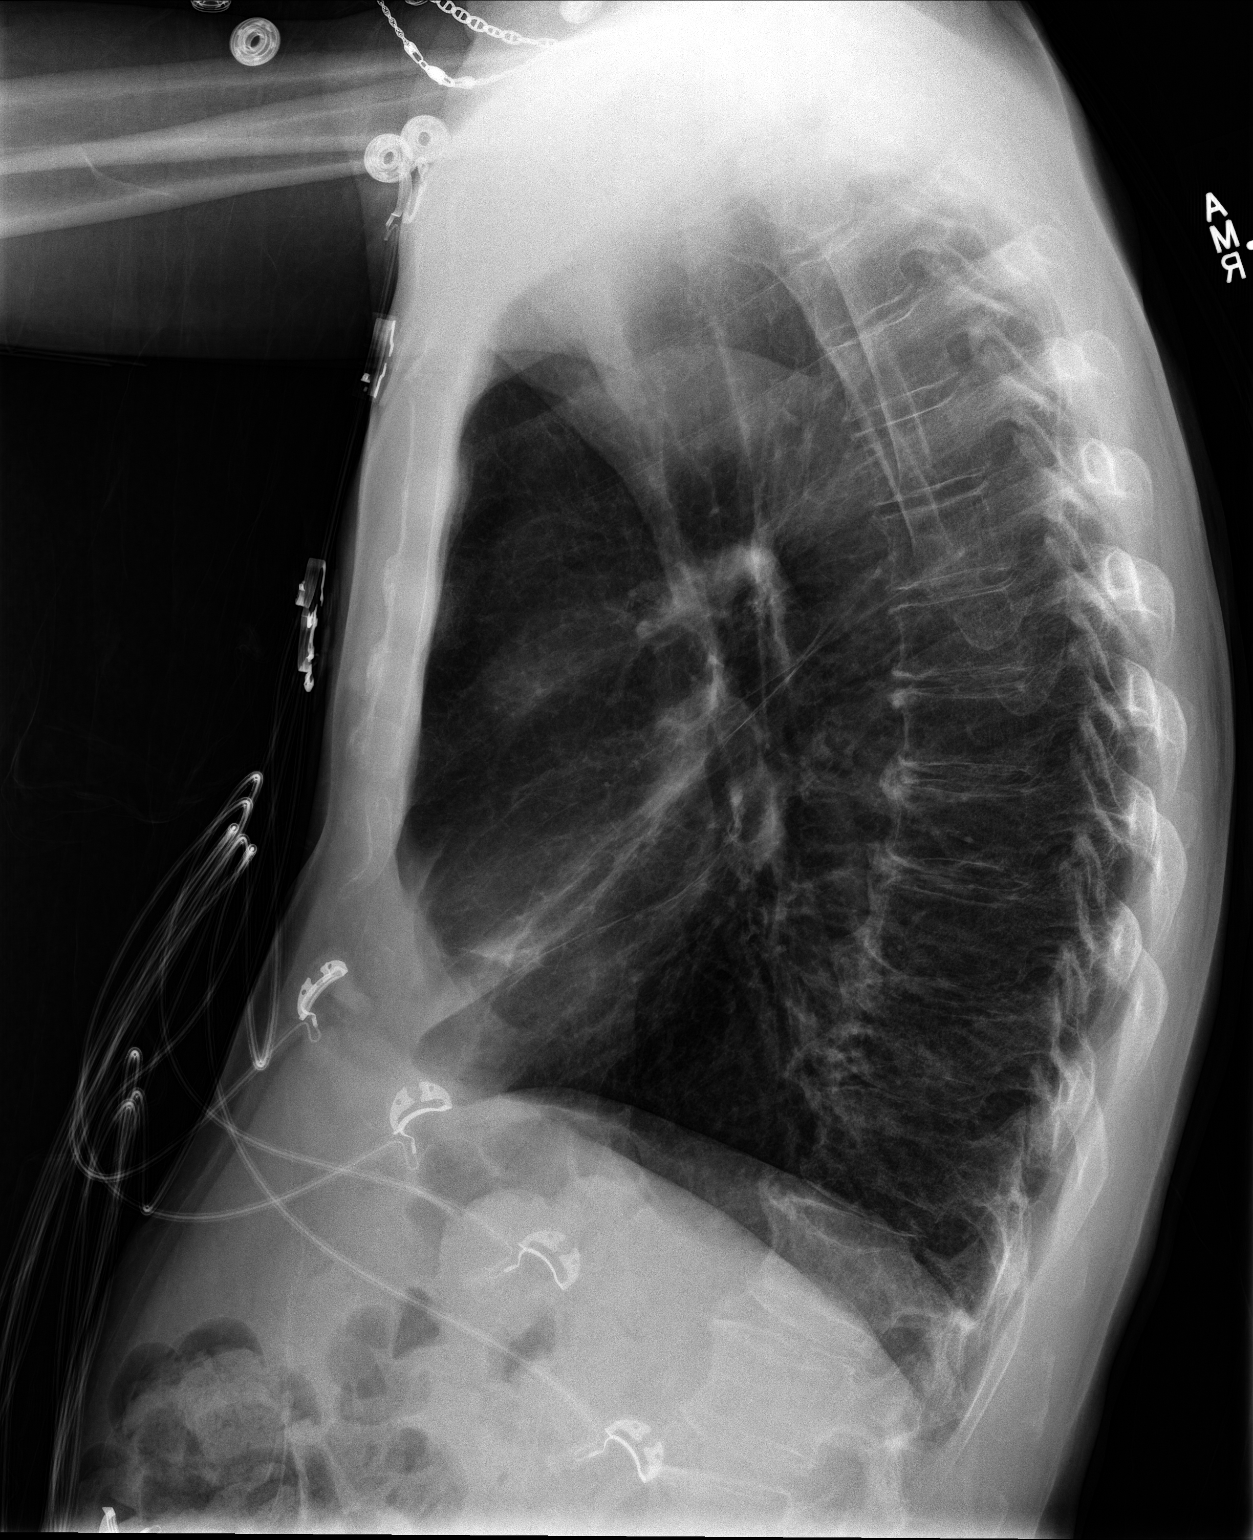

[chest pa (2 of 2)]
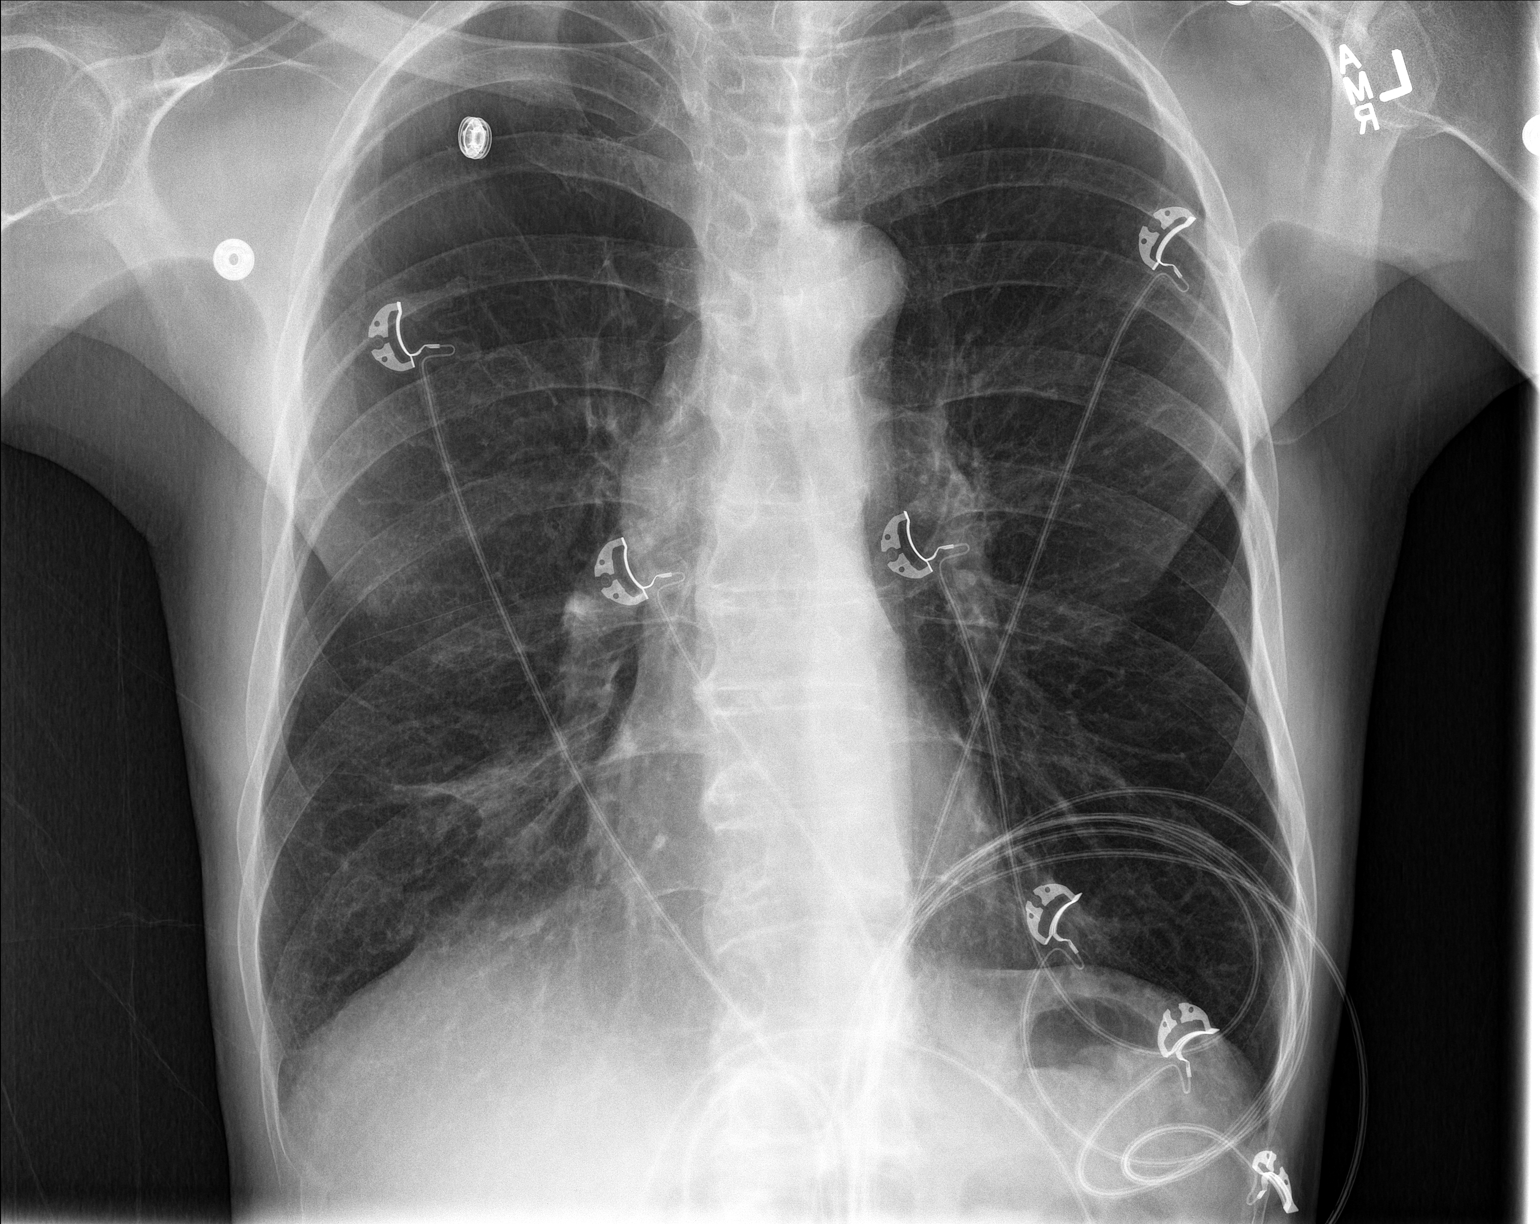

[3 of 3 positions shown; findings below may reference images not displayed]

FINDINGS: Cardiomediastinal silhouette unchanged in size and contour. No
evidence of central vascular congestion. No interlobular septal
thickening.

Stigmata of emphysema, with increased retrosternal airspace,
flattened hemidiaphragms, increased AP diameter, and hyperinflation
on the AP view.

Chronic opacity in the right middle lobe, unchanged over time.

No new confluent airspace disease. Degenerative changes of the
spine. No acute displaced fracture.
IMPRESSION: Advanced emphysema and chronic lung changes without evidence of
acute cardiopulmonary disease.

Note that low-dose CT lung cancer screening is recommended for
patients who are 55-80 years of age with a 30+ pack-year history of
smoking, and who are currently smoking or quit <=15 years ago.
Referral for pulmonary evaluation may be considered, to evaluate for
possible screening candidacy if not already performed.

## 2020-10-08 NOTE — Telephone Encounter (Signed)
Called and spoke to pt. Pt states she has been taking 1/2 tab of the Micardis 40mg . Pt states his pressure yesterday was 131/66. Pt also states he isnt feeling well but was only c/o of feeling "hot". Pt denies any other s/s. It was very difficult to understand pt. Pt states he didn't want taking his information to send to Dr. [place] and wanted someone else but couldn't remember who. Pt states he wants his wife to talk to Sherene Sires instead of him. Pt states she comes home around 12-1230p and she will call us back. Will await her call.

## 2020-10-14 NOTE — Telephone Encounter (Signed)
Called and spoke with patient's wife Jeffery [name] to check on patient. She stated that the patient has been doing well since he last called the office last week. His BPs have been running normal, he is not in any distress. She appreciates [place] calling to check on him.   Nothing further needed at time of call.

## 2020-11-25 DIAGNOSIS — J440 Chronic obstructive pulmonary disease with acute lower respiratory infection: Secondary | ICD-10-CM | POA: Diagnosis not present

## 2020-11-25 DIAGNOSIS — E1143 Type 2 diabetes mellitus with diabetic autonomic (poly)neuropathy: Secondary | ICD-10-CM | POA: Diagnosis not present

## 2020-11-25 DIAGNOSIS — K5900 Constipation, unspecified: Secondary | ICD-10-CM | POA: Diagnosis not present

## 2020-11-25 DIAGNOSIS — I700 Atherosclerosis of aorta: Secondary | ICD-10-CM | POA: Diagnosis not present

## 2020-12-15 ENCOUNTER — Other Ambulatory Visit: Payer: Self-pay

## 2020-12-15 ENCOUNTER — Encounter (HOSPITAL_COMMUNITY): Payer: Self-pay

## 2020-12-15 ENCOUNTER — Emergency Department (HOSPITAL_COMMUNITY)
Admission: EM | Admit: 2020-12-15 | Discharge: 2020-12-15 | Disposition: A | Discharge status: Home / Self Care | Payer: Medicare Other | Attending: Emergency Medicine | Admitting: Emergency Medicine

## 2020-12-15 ENCOUNTER — Emergency Department (HOSPITAL_COMMUNITY): Payer: Medicare Other

## 2020-12-15 DIAGNOSIS — Z87891 Personal history of nicotine dependence: Secondary | ICD-10-CM | POA: Insufficient documentation

## 2020-12-15 DIAGNOSIS — Z7951 Long term (current) use of inhaled steroids: Secondary | ICD-10-CM | POA: Insufficient documentation

## 2020-12-15 DIAGNOSIS — J449 Chronic obstructive pulmonary disease, unspecified: Secondary | ICD-10-CM | POA: Insufficient documentation

## 2020-12-15 DIAGNOSIS — R0789 Other chest pain: Secondary | ICD-10-CM | POA: Diagnosis not present

## 2020-12-15 DIAGNOSIS — Z955 Presence of coronary angioplasty implant and graft: Secondary | ICD-10-CM | POA: Insufficient documentation

## 2020-12-15 DIAGNOSIS — Z7982 Long term (current) use of aspirin: Secondary | ICD-10-CM | POA: Insufficient documentation

## 2020-12-15 DIAGNOSIS — Z79899 Other long term (current) drug therapy: Secondary | ICD-10-CM | POA: Diagnosis not present

## 2020-12-15 DIAGNOSIS — I10 Essential (primary) hypertension: Secondary | ICD-10-CM | POA: Insufficient documentation

## 2020-12-15 DIAGNOSIS — R072 Precordial pain: Secondary | ICD-10-CM

## 2020-12-15 DIAGNOSIS — R079 Chest pain, unspecified: Secondary | ICD-10-CM | POA: Diagnosis not present

## 2020-12-15 LAB — CBC
HCT: 33.8 % — ABNORMAL LOW (ref 39.0–52.0)
Hemoglobin: 10.4 g/dL — ABNORMAL LOW (ref 13.0–17.0)
MCH: 28.3 pg (ref 26.0–34.0)
MCHC: 30.8 g/dL (ref 30.0–36.0)
MCV: 91.8 fL (ref 80.0–100.0)
Platelets: 276 10*3/uL (ref 150–400)
RBC: 3.68 MIL/uL — ABNORMAL LOW (ref 4.22–5.81)
RDW: 15.5 % (ref 11.5–15.5)
WBC: 8.1 10*3/uL (ref 4.0–10.5)
nRBC: 0 % (ref 0.0–0.2)

## 2020-12-15 LAB — BASIC METABOLIC PANEL
Anion gap: 8 (ref 5–15)
BUN: 13 mg/dL (ref 8–23)
CO2: 34 mmol/L — ABNORMAL HIGH (ref 22–32)
Calcium: 9.1 mg/dL (ref 8.9–10.3)
Chloride: 97 mmol/L — ABNORMAL LOW (ref 98–111)
Creatinine, Ser: 0.63 mg/dL (ref 0.61–1.24)
GFR, Estimated: >60 mL/min (ref >60)
Glucose, Bld: 110 mg/dL — ABNORMAL HIGH (ref 70–99)
Potassium: 3.5 mmol/L (ref 3.5–5.1)
Sodium: 139 mmol/L (ref 135–145)

## 2020-12-15 LAB — TROPONIN I (HIGH SENSITIVITY)
Troponin I (High Sensitivity): 3 ng/L (ref <18)
Troponin I (High Sensitivity): 3 ng/L (ref <18)

## 2020-12-15 MED ORDER — ALBUTEROL SULFATE (2.5 MG/3ML) 0.083% IN NEBU: 2.5000 mg | INHALATION_SOLUTION | Freq: Once | RESPIRATORY_TRACT | Status: DC

## 2020-12-15 NOTE — ED Provider Notes (Signed)
[facility] EMERGENCY DEPARTMENT Provider Note   CSN: [id number] Arrival date & time: 12/15/20  1338     History Chief Complaint  Patient presents with   Chest Pain    R side chest pain since this am. Feels tight.    Jeffery [name] is a 70 y.o. male.  Patient with a complaint of right anterior chest pain starting this morning.  Felt fine yesterday.  Has not had anything like this before.  Patient known to have a history of COPD.  So on 3 L of oxygen at all times.  Patient denies any significant shortness of breath or nausea or vomiting.  Past medical history also significant hyperlipidemia hypertension.      Past Medical History:  Diagnosis Date   Chest pain 04/2011   Normal echocardiogram   COPD (chronic obstructive pulmonary disease) (HCC)    Fasting hyperglycemia    Normal hemoglobin A1c of 6.0   GAD (generalized anxiety disorder)    GERD (gastroesophageal reflux disease)    Hyperlipemia    Hypertension    On home O2    2L N/C   On home O2    at night   Prediabetes 06/04/2015   Tobacco abuse     Patient Active Problem List   Diagnosis Date Noted   COPD with acute exacerbation (HCC) 07/06/2020   Leukocytosis 02/18/2020   Protein-calorie malnutrition, severe 02/18/2020   On home O2    GERD (gastroesophageal reflux disease)    GAD (generalized anxiety disorder)    CAP (community acquired pneumonia) 02/17/2020   Bilateral pneumonia 02/17/2020   Weight loss 01/28/2019   COPD  GOLD ?  11/04/2018   Chronic respiratory failure with hypoxia (HCC) 10/04/2018   Acute on chronic respiratory failure with hypoxia (HCC) 11/07/2016   Prediabetes 06/04/2015   Hyperglycemia, drug-induced 06/03/2015   Essential hypertension 06/03/2015   COPD exacerbation (HCC) 06/02/2015   Hyperlipidemia 07/20/2011   Fasting hyperglycemia    Tobacco abuse    Chest pain 04/02/2011    Past Surgical History:  Procedure Laterality Date   APPENDECTOMY     APPENDECTOMY     CATARACT  EXTRACTION W/PHACO Left 06/10/2019   Procedure: CATARACT EXTRACTION PHACO AND INTRAOCULAR LENS PLACEMENT (IOC);  Surgeon: Fabio Pierce, MD;  Location: [facility];  Service: Ophthalmology;  Laterality: Left;  CDE: 3.73   CATARACT EXTRACTION W/PHACO Right 06/24/2019   Procedure: CATARACT EXTRACTION PHACO AND INTRAOCULAR LENS PLACEMENT (IOC);  Surgeon: Fabio Pierce, MD;  Location: [facility];  Service: Ophthalmology;  Laterality: Right;  CDE: 4.42   LEFT HEART CATHETERIZATION WITH CORONARY ANGIOGRAM N/A 05/17/2011   Procedure: LEFT HEART CATHETERIZATION WITH CORONARY ANGIOGRAM;  Surgeon: Tonny Bollman, MD;  Location: [facility] CATH LAB;  Service: Cardiovascular;  Laterality: N/A;       Family History  Problem Relation Age of Onset   Emphysema Father     Social History   Tobacco Use   Smoking status: Former    Packs/day: 1.00    Years: 35.00    Pack years: 35.00    Types: Cigarettes    Quit date: 05/09/2013    Years since quitting: 7.6   Smokeless tobacco: Never  Vaping Use   Vaping Use: Never used  Substance Use Topics   Alcohol use: No   Drug use: No    Home Medications Prior to Admission medications   Medication Sig Start Date End Date Taking? Authorizing Provider  ALPRAZolam Prudy Feeler) 0.5 MG tablet Take by mouth. 08/28/20  Yes  [provider]  aspirin (ADULT ASPIRIN REGIMEN) 81 MG EC tablet Take by mouth. 08/28/20  Yes [provider]  famotidine (PEPCID) 40 MG tablet Take by mouth. 08/28/20  Yes [provider]  gabapentin (NEURONTIN) 300 MG capsule Take by mouth. 08/28/20  Yes [provider]  metFORMIN (GLUCOPHAGE) 500 MG tablet Take by mouth. 08/28/20  Yes [provider]  mirtazapine (REMERON) 15 MG tablet Take by mouth. 08/28/20  Yes [provider]  Multiple Vitamin (MULTI-VITAMINS) TABS Take by mouth. 08/28/20  Yes [provider]  omeprazole (PRILOSEC) 20 MG capsule Take by mouth. 08/28/20  Yes [provider]   ondansetron (ZOFRAN) 4 MG tablet Take by mouth. 09/05/20  Yes [provider]  Potassium Chloride ER 20 MEQ TBCR Take by mouth. 08/28/20  Yes [provider]  albuterol (PROAIR HFA) 108 (90 Base) MCG/ACT inhaler Inhale 1-2 puffs into the lungs every 4 (four) hours as needed. Patient taking differently: Inhale 1-2 puffs into the lungs every 4 (four) hours as needed for wheezing or shortness of breath. 11/16/18   Nyoka Cowden, MD  albuterol (PROVENTIL) (2.5 MG/3ML) 0.083% nebulizer solution Take 3 mLs (2.5 mg total) by nebulization every 4 (four) hours as needed for wheezing or shortness of breath. 09/15/20   Nyoka Cowden, MD  ALPRAZolam Prudy Feeler) 0.5 MG tablet Take 0.5 mg by mouth 2 (two) times daily. Takes 1/2 tab in the morning and 1 tablet at bedtime    [provider]  aspirin EC 81 MG tablet Take 81 mg by mouth daily.    [provider]  atorvastatin (LIPITOR) 10 MG tablet Take 1 tablet (10 mg total) by mouth at bedtime. 07/07/20   Vassie Loll, MD  Budeson-Glycopyrrol-Formoterol (BREZTRI AEROSPHERE) 160-9-4.8 MCG/ACT AERO Inhale into the lungs 2 (two) times daily. 07/08/20   [provider]  famotidine (PEPCID) 40 MG tablet 1 daily after supper Patient taking differently: Take 40 mg by mouth 2 (two) times daily. 10/03/18   Nyoka Cowden, MD  Fluticasone-Umeclidin-Vilant (TRELEGY ELLIPTA) 100-62.5-25 MCG/INH AEPB One click each am 10/18/19   Nyoka Cowden, MD  furosemide (LASIX) 20 MG tablet Take 10 mg by mouth daily. 11/27/19   [provider]  gabapentin (NEURONTIN) 300 MG capsule Take 300 mg by mouth 3 (three) times daily.  03/22/13   [provider]  ipratropium-albuterol (DUONEB) 0.5-2.5 (3) MG/3ML SOLN Take 3 mLs by nebulization every 6 (six) hours as needed. 12/14/20   [provider]  isosorbide mononitrate (IMDUR) 30 MG 24 hr tablet Take 30 mg by mouth daily. 03/22/14   [provider]  linaclotide (LINZESS)  145 MCG CAPS capsule Take 145 mcg by mouth daily before breakfast.    [provider]  LORazepam (ATIVAN) 1 MG tablet Take 1 mg by mouth 3 (three) times daily. 12/10/20   [provider]  MATZIM LA 240 MG 24 hr tablet Take 240 mg by mouth daily. 04/16/19   [provider]  metFORMIN (GLUCOPHAGE) 500 MG tablet Take 500 mg by mouth daily.    [provider]  mirtazapine (REMERON) 15 MG tablet Take 7.5 mg by mouth at bedtime. 02/12/20   [provider]  morphine 20 MG/5ML solution Take 0.1-0.25 mg by mouth every 2 (two) hours as needed for pain.    [provider]  Multiple Vitamin (MULTIVITAMIN WITH MINERALS) TABS tablet Take 1 tablet by mouth daily.    [provider]  omeprazole (PRILOSEC) 20 MG  capsule Take 20 mg by mouth daily.    [provider]  ondansetron (ZOFRAN) 4 MG tablet Take 4 mg by mouth every 8 (eight) hours as needed for nausea or vomiting.    [provider]  OXYGEN 2lpm 24/7    [provider]  polyethylene glycol (MIRALAX / GLYCOLAX) 17 g packet Take 17 g by mouth daily.    [provider]  potassium chloride SA (K-DUR,KLOR-CON) 20 MEQ tablet Take 20 mEq by mouth daily.  05/12/14   [provider]  predniSONE (DELTASONE) 5 MG tablet Take 1 tablet (5 mg total) by mouth daily with breakfast. Start taking it again after completion of acute tapering prescription. 07/07/20   Vassie LollMadera, Carlos, MD  rosuvastatin (CRESTOR) 5 MG tablet Take 5 mg by mouth daily. 11/25/20   [provider]  senna (SENOKOT) 8.6 MG TABS tablet Take 1 tablet by mouth daily as needed for mild constipation.    [provider]  simvastatin (ZOCOR) 20 MG tablet Take 20 mg by mouth at bedtime. 09/16/20   [provider]  sucralfate (CARAFATE) 1 g tablet Take 1 g by mouth 2 (two) times daily.  07/19/18   [provider]  telmisartan (MICARDIS) 40 MG tablet Take 1 tablet (40 mg total) by  mouth daily. 06/23/20   Nyoka CowdenWert, Rj B, MD    Allergies    Codeine  Review of Systems   Review of Systems  Constitutional:  Negative for chills and fever.  HENT:  Negative for rhinorrhea and sore throat.   Eyes:  Negative for visual disturbance.  Respiratory:  Negative for cough and shortness of breath.   Cardiovascular:  Positive for chest pain. Negative for leg swelling.  Gastrointestinal:  Negative for abdominal pain, diarrhea, nausea and vomiting.  Genitourinary:  Negative for dysuria.  Musculoskeletal:  Negative for back pain and neck pain.  Skin:  Negative for rash.  Neurological:  Negative for dizziness, light-headedness and headaches.  Hematological:  Does not bruise/bleed easily.  Psychiatric/Behavioral:  Negative for confusion.    Physical Exam Updated Vital Signs BP (!) 159/81   Pulse 79   Temp 98.2 F (36.8 C) (Oral)   Resp (!) 27   Ht 1.676 m (5\' 6" )   Wt 47.6 kg   SpO2 100%   BMI 16.95 kg/m   Physical Exam Vitals and nursing note reviewed.  Constitutional:      General: He is not in acute distress.    Appearance: Normal appearance. He is well-developed.  HENT:     Head: Normocephalic and atraumatic.  Eyes:     Extraocular Movements: Extraocular movements intact.     Conjunctiva/sclera: Conjunctivae normal.     Pupils: Pupils are equal, round, and reactive to light.  Cardiovascular:     Rate and Rhythm: Normal rate and regular rhythm.     Heart sounds: No murmur heard. Pulmonary:     Effort: Pulmonary effort is normal. No respiratory distress.     Breath sounds: Normal breath sounds. No wheezing, rhonchi or rales.  Chest:     Chest wall: No tenderness.  Abdominal:     Palpations: Abdomen is soft.     Tenderness: There is no abdominal tenderness.  Musculoskeletal:        General: No swelling. Normal range of motion.     Cervical back: Normal range of motion and neck supple.  Skin:    General: Skin is warm and dry.  Neurological:     General:  No focal deficit present.     Mental Status: He is alert and oriented to person, place, and time.    ED Results / Procedures / Treatments   Labs (all labs ordered are listed, but only abnormal results are displayed) Labs Reviewed  BASIC METABOLIC PANEL - Abnormal; Notable for the following components:      Result Value   Chloride 97 (*)    CO2 34 (*)    Glucose, Bld 110 (*)    All other components within normal limits  CBC - Abnormal; Notable for the following components:   RBC 3.68 (*)    Hemoglobin 10.4 (*)    HCT 33.8 (*)    All other components within normal limits  TROPONIN I (HIGH SENSITIVITY)  TROPONIN I (HIGH SENSITIVITY)    EKG EKG Interpretation  Date/Time:  Tuesday December 15 2020 13:48:08 EDT Ventricular Rate:  79 PR Interval:  169 QRS Duration: 82 QT Interval:  458 QTC Calculation: 526 R Axis:   76 Text Interpretation: Sinus rhythm Anteroseptal infarct, age indeterminate Nonspecific T abnormalities, lateral leads Prolonged QT interval Confirmed by Vanetta Mulders [phone]) on 12/15/2020 4:45:52 PM  Radiology DG Chest 2 View  Result Date: 12/15/2020 CLINICAL DATA:  Right-sided chest pain EXAM: CHEST - 2 VIEW COMPARISON:  07/13/2020 FINDINGS: Hyperinflation with emphysema. No focal opacity or pleural effusion. Stable cardiomediastinal silhouette with aortic atherosclerosis. No pneumothorax. Right infrahilar scarring. Chronic midthoracic compression fracture. IMPRESSION: Emphysema without acute airspace disease. Right middle lobe scarring. Electronically Signed   By: Jasmine Pang M.D.   On: 12/15/2020 16:30    Procedures Procedures   Medications Ordered in ED Medications  albuterol (PROVENTIL) (2.5 MG/3ML) 0.083% nebulizer solution 2.5 mg (has no administration in time range)    ED Course  I have reviewed the triage vital signs and the nursing notes.  Pertinent labs & imaging results that were available during my care of the patient were reviewed by me and  considered in my medical decision making (see chart for details).    MDM Rules/Calculators/A&P                           Patient's initial work-up for the chest pain reassuring.  Initial troponin is normal.  Will need delta troponin.  Chest x-ray negative.  EKG without any significant changes.  Patient normally on 3 L of oxygen at all times.  He is comfortable with that.  Labs significant for hemoglobin of 10.4 a little bit lower than baseline.  If delta troponin negative patient should be stable for discharge home and follow-up with his doctors.  Delta troponins without any significant change.  Patient nontoxic no acute distress.  Patient stable for discharge home and follow-up with his doctors.  Patient wanted a nebulizer treatment here like he does at home.  But respiratory therapy refused to give it without a negative COVID test although patient really does not have any COVID symptoms.  Final Clinical Impression(s) / ED Diagnoses Final diagnoses:  Precordial pain    Rx / DC Orders ED Discharge Orders     None        Vanetta Mulders, MD 12/15/20 1931

## 2020-12-15 NOTE — ED Notes (Signed)
Wheeled to lobby with spouse

## 2020-12-15 NOTE — ED Notes (Signed)
Took pt to the restroom via wheelchair. Put pt back on monitor.

## 2020-12-15 NOTE — ED Triage Notes (Signed)
Pt c/o R sided chest pain that began this am. Has not felt this before.

## 2020-12-15 NOTE — Discharge Instructions (Addendum)
Return for any new or worse symptoms.  Make an appointment to follow-up with your primary care doctor in the next few days.  Work-up here today for the chest pain without any acute or significant findings.

## 2020-12-28 ENCOUNTER — Other Ambulatory Visit: Payer: Self-pay | Admitting: Internal Medicine

## 2021-01-07 ENCOUNTER — Other Ambulatory Visit: Payer: Self-pay | Admitting: Internal Medicine

## 2021-03-04 ENCOUNTER — Inpatient Hospital Stay (HOSPITAL_COMMUNITY)
Admission: EM | Admit: 2021-03-04 | Discharge: 2021-04-01 | DRG: 064 | Disposition: E | Payer: Medicare Other | Attending: Neurosurgery | Admitting: Neurosurgery

## 2021-03-04 ENCOUNTER — Emergency Department (HOSPITAL_COMMUNITY): Payer: Medicare Other

## 2021-03-04 ENCOUNTER — Other Ambulatory Visit: Payer: Self-pay

## 2021-03-04 ENCOUNTER — Encounter (HOSPITAL_COMMUNITY): Payer: Self-pay | Admitting: Emergency Medicine

## 2021-03-04 DIAGNOSIS — Z825 Family history of asthma and other chronic lower respiratory diseases: Secondary | ICD-10-CM

## 2021-03-04 DIAGNOSIS — R220 Localized swelling, mass and lump, head: Secondary | ICD-10-CM | POA: Diagnosis not present

## 2021-03-04 DIAGNOSIS — I6522 Occlusion and stenosis of left carotid artery: Secondary | ICD-10-CM | POA: Diagnosis present

## 2021-03-04 DIAGNOSIS — Z885 Allergy status to narcotic agent status: Secondary | ICD-10-CM | POA: Diagnosis not present

## 2021-03-04 DIAGNOSIS — I610 Nontraumatic intracerebral hemorrhage in hemisphere, subcortical: Principal | ICD-10-CM | POA: Diagnosis present

## 2021-03-04 DIAGNOSIS — R531 Weakness: Secondary | ICD-10-CM | POA: Diagnosis not present

## 2021-03-04 DIAGNOSIS — R7303 Prediabetes: Secondary | ICD-10-CM | POA: Diagnosis present

## 2021-03-04 DIAGNOSIS — I619 Nontraumatic intracerebral hemorrhage, unspecified: Secondary | ICD-10-CM | POA: Diagnosis not present

## 2021-03-04 DIAGNOSIS — I616 Nontraumatic intracerebral hemorrhage, multiple localized: Secondary | ICD-10-CM | POA: Diagnosis not present

## 2021-03-04 DIAGNOSIS — Z7951 Long term (current) use of inhaled steroids: Secondary | ICD-10-CM

## 2021-03-04 DIAGNOSIS — Z7952 Long term (current) use of systemic steroids: Secondary | ICD-10-CM

## 2021-03-04 DIAGNOSIS — Z9981 Dependence on supplemental oxygen: Secondary | ICD-10-CM | POA: Diagnosis not present

## 2021-03-04 DIAGNOSIS — E785 Hyperlipidemia, unspecified: Secondary | ICD-10-CM | POA: Diagnosis present

## 2021-03-04 DIAGNOSIS — I675 Moyamoya disease: Secondary | ICD-10-CM | POA: Diagnosis present

## 2021-03-04 DIAGNOSIS — J9611 Chronic respiratory failure with hypoxia: Secondary | ICD-10-CM | POA: Diagnosis present

## 2021-03-04 DIAGNOSIS — Z66 Do not resuscitate: Secondary | ICD-10-CM | POA: Diagnosis present

## 2021-03-04 DIAGNOSIS — R4701 Aphasia: Secondary | ICD-10-CM | POA: Diagnosis present

## 2021-03-04 DIAGNOSIS — Z79899 Other long term (current) drug therapy: Secondary | ICD-10-CM | POA: Diagnosis not present

## 2021-03-04 DIAGNOSIS — I671 Cerebral aneurysm, nonruptured: Secondary | ICD-10-CM | POA: Diagnosis not present

## 2021-03-04 DIAGNOSIS — Z20822 Contact with and (suspected) exposure to covid-19: Secondary | ICD-10-CM | POA: Diagnosis present

## 2021-03-04 DIAGNOSIS — F411 Generalized anxiety disorder: Secondary | ICD-10-CM | POA: Diagnosis present

## 2021-03-04 DIAGNOSIS — I10 Essential (primary) hypertension: Secondary | ICD-10-CM | POA: Diagnosis not present

## 2021-03-04 DIAGNOSIS — G8191 Hemiplegia, unspecified affecting right dominant side: Secondary | ICD-10-CM | POA: Diagnosis present

## 2021-03-04 DIAGNOSIS — K219 Gastro-esophageal reflux disease without esophagitis: Secondary | ICD-10-CM | POA: Diagnosis present

## 2021-03-04 DIAGNOSIS — G935 Compression of brain: Secondary | ICD-10-CM | POA: Diagnosis not present

## 2021-03-04 DIAGNOSIS — G936 Cerebral edema: Secondary | ICD-10-CM | POA: Diagnosis not present

## 2021-03-04 DIAGNOSIS — Z743 Need for continuous supervision: Secondary | ICD-10-CM | POA: Diagnosis not present

## 2021-03-04 DIAGNOSIS — R6889 Other general symptoms and signs: Secondary | ICD-10-CM | POA: Diagnosis not present

## 2021-03-04 DIAGNOSIS — Z515 Encounter for palliative care: Secondary | ICD-10-CM

## 2021-03-04 DIAGNOSIS — Z7189 Other specified counseling: Secondary | ICD-10-CM | POA: Diagnosis not present

## 2021-03-04 DIAGNOSIS — Z7982 Long term (current) use of aspirin: Secondary | ICD-10-CM | POA: Diagnosis not present

## 2021-03-04 DIAGNOSIS — R0602 Shortness of breath: Secondary | ICD-10-CM | POA: Diagnosis not present

## 2021-03-04 DIAGNOSIS — R4781 Slurred speech: Secondary | ICD-10-CM | POA: Diagnosis not present

## 2021-03-04 DIAGNOSIS — Z87891 Personal history of nicotine dependence: Secondary | ICD-10-CM

## 2021-03-04 DIAGNOSIS — J449 Chronic obstructive pulmonary disease, unspecified: Secondary | ICD-10-CM | POA: Diagnosis present

## 2021-03-04 DIAGNOSIS — R52 Pain, unspecified: Secondary | ICD-10-CM | POA: Diagnosis not present

## 2021-03-04 DIAGNOSIS — G459 Transient cerebral ischemic attack, unspecified: Secondary | ICD-10-CM | POA: Diagnosis not present

## 2021-03-04 LAB — CBC
HCT: 35.2 % — ABNORMAL LOW (ref 39.0–52.0)
HCT: 36.4 % — ABNORMAL LOW (ref 39.0–52.0)
Hemoglobin: 10.6 g/dL — ABNORMAL LOW (ref 13.0–17.0)
Hemoglobin: 11.4 g/dL — ABNORMAL LOW (ref 13.0–17.0)
MCH: 26.9 pg (ref 26.0–34.0)
MCH: 27 pg (ref 26.0–34.0)
MCHC: 30.1 g/dL (ref 30.0–36.0)
MCHC: 31.3 g/dL (ref 30.0–36.0)
MCV: 89.3 fL (ref 80.0–100.0)
MCV: 86.1 fL (ref 80.0–100.0)
Platelets: 234 10*3/uL (ref 150–400)
Platelets: 258 10*3/uL (ref 150–400)
RBC: 3.94 MIL/uL — ABNORMAL LOW (ref 4.22–5.81)
RBC: 4.23 MIL/uL (ref 4.22–5.81)
RDW: 15.9 % — ABNORMAL HIGH (ref 11.5–15.5)
RDW: 15.9 % — ABNORMAL HIGH (ref 11.5–15.5)
WBC: 8 10*3/uL (ref 4.0–10.5)
WBC: 13.3 10*3/uL — ABNORMAL HIGH (ref 4.0–10.5)
nRBC: 0 % (ref 0.0–0.2)
nRBC: 0 % (ref 0.0–0.2)

## 2021-03-04 LAB — I-STAT CHEM 8, ED
BUN: 14 mg/dL (ref 8–23)
Calcium, Ion: 1.15 mmol/L (ref 1.15–1.40)
Chloride: 91 mmol/L — ABNORMAL LOW (ref 98–111)
Creatinine, Ser: 0.7 mg/dL (ref 0.61–1.24)
Glucose, Bld: 101 mg/dL — ABNORMAL HIGH (ref 70–99)
HCT: 32 % — ABNORMAL LOW (ref 39.0–52.0)
Hemoglobin: 10.9 g/dL — ABNORMAL LOW (ref 13.0–17.0)
Potassium: 3.6 mmol/L (ref 3.5–5.1)
Sodium: 137 mmol/L (ref 135–145)
TCO2: 37 mmol/L — ABNORMAL HIGH (ref 22–32)

## 2021-03-04 LAB — COMPREHENSIVE METABOLIC PANEL
ALT: 13 U/L (ref 0–44)
AST: 15 U/L (ref 15–41)
Albumin: 3.7 g/dL (ref 3.5–5.0)
Alkaline Phosphatase: 54 U/L (ref 38–126)
Anion gap: 7 (ref 5–15)
BUN: 15 mg/dL (ref 8–23)
CO2: 36 mmol/L — ABNORMAL HIGH (ref 22–32)
Calcium: 8.9 mg/dL (ref 8.9–10.3)
Chloride: 95 mmol/L — ABNORMAL LOW (ref 98–111)
Creatinine, Ser: 0.62 mg/dL (ref 0.61–1.24)
GFR, Estimated: >60 mL/min (ref >60)
Glucose, Bld: 107 mg/dL — ABNORMAL HIGH (ref 70–99)
Potassium: 3.7 mmol/L (ref 3.5–5.1)
Sodium: 138 mmol/L (ref 135–145)
Total Bilirubin: 0.5 mg/dL (ref 0.3–1.2)
Total Protein: 6.2 g/dL — ABNORMAL LOW (ref 6.5–8.1)

## 2021-03-04 LAB — PROTIME-INR
INR: 1.1 (ref 0.8–1.2)
INR: 1 (ref 0.8–1.2)
Prothrombin Time: 13.9 seconds (ref 11.4–15.2)
Prothrombin Time: 13.3 seconds (ref 11.4–15.2)

## 2021-03-04 LAB — APTT
aPTT: 32 seconds (ref 24–36)
aPTT: 30 seconds (ref 24–36)

## 2021-03-04 LAB — RAPID URINE DRUG SCREEN, HOSP PERFORMED
Amphetamines: NOT DETECTED
Barbiturates: NOT DETECTED
Benzodiazepines: NOT DETECTED
Cocaine: NOT DETECTED
Opiates: NOT DETECTED
Tetrahydrocannabinol: NOT DETECTED

## 2021-03-04 LAB — DIFFERENTIAL
Abs Immature Granulocytes: 0.03 10*3/uL (ref 0.00–0.07)
Basophils Absolute: 0 10*3/uL (ref 0.0–0.1)
Basophils Relative: 0 %
Eosinophils Absolute: 0.2 10*3/uL (ref 0.0–0.5)
Eosinophils Relative: 3 %
Immature Granulocytes: 0 %
Lymphocytes Relative: 9 %
Lymphs Abs: 0.7 10*3/uL (ref 0.7–4.0)
Monocytes Absolute: 0.5 10*3/uL (ref 0.1–1.0)
Monocytes Relative: 6 %
Neutro Abs: 6.6 10*3/uL (ref 1.7–7.7)
Neutrophils Relative %: 82 %

## 2021-03-04 LAB — URINALYSIS, ROUTINE W REFLEX MICROSCOPIC
Bacteria, UA: NONE SEEN
Bilirubin Urine: NEGATIVE
Glucose, UA: NEGATIVE mg/dL
Ketones, ur: NEGATIVE mg/dL
Leukocytes,Ua: NEGATIVE
Nitrite: NEGATIVE
Protein, ur: NEGATIVE mg/dL
Specific Gravity, Urine: 1.012 (ref 1.005–1.030)
pH: 7 (ref 5.0–8.0)

## 2021-03-04 LAB — RESP PANEL BY RT-PCR (FLU A&B, COVID) ARPGX2
Influenza A by PCR: NEGATIVE
Influenza B by PCR: NEGATIVE
SARS Coronavirus 2 by RT PCR: NEGATIVE

## 2021-03-04 LAB — ETHANOL: Alcohol, Ethyl (B): <10 mg/dL (ref <10)

## 2021-03-04 LAB — CBG MONITORING, ED: Glucose-Capillary: 104 mg/dL — ABNORMAL HIGH (ref 70–99)

## 2021-03-04 LAB — MRSA NEXT GEN BY PCR, NASAL: MRSA by PCR Next Gen: NOT DETECTED

## 2021-03-04 LAB — HIV ANTIBODY (ROUTINE TESTING W REFLEX): HIV Screen 4th Generation wRfx: NONREACTIVE

## 2021-03-04 MED ORDER — ACETAMINOPHEN 325 MG PO TABS: 650.0000 mg | ORAL_TABLET | ORAL | Status: DC | PRN

## 2021-03-04 MED ORDER — PANTOPRAZOLE SODIUM 40 MG PO TBEC
40.0000 mg | DELAYED_RELEASE_TABLET | Freq: Every day | ORAL | Status: DC
  Administered 2021-03-05: 40 mg via ORAL
  Filled 2021-03-04: qty 1

## 2021-03-04 MED ORDER — ACETAMINOPHEN 160 MG/5ML PO SOLN: 650.0000 mg | ORAL | Status: DC | PRN

## 2021-03-04 MED ORDER — ONDANSETRON 4 MG PO TBDP: 4.0000 mg | ORAL_TABLET | Freq: Four times a day (QID) | ORAL | Status: DC | PRN

## 2021-03-04 MED ORDER — STROKE: EARLY STAGES OF RECOVERY BOOK
Freq: Once | Status: DC
  Filled 2021-03-04: qty 1

## 2021-03-04 MED ORDER — ACETAMINOPHEN 650 MG RE SUPP: 650.0000 mg | RECTAL | Status: DC | PRN

## 2021-03-04 MED ORDER — MORPHINE SULFATE (PF) 2 MG/ML IV SOLN
1.0000 mg | INTRAVENOUS | Status: DC | PRN
  Administered 2021-03-04 – 2021-03-05 (×2): 2 mg via INTRAVENOUS
  Filled 2021-03-04 (×3): qty 1

## 2021-03-04 MED ORDER — ONDANSETRON HCL 4 MG/2ML IJ SOLN: 4.0000 mg | Freq: Four times a day (QID) | INTRAMUSCULAR | Status: DC | PRN

## 2021-03-04 MED ORDER — NIMODIPINE 30 MG PO CAPS: 60.0000 mg | ORAL_CAPSULE | ORAL | Status: DC

## 2021-03-04 MED ORDER — NIMODIPINE 6 MG/ML PO SOLN
60.0000 mg | ORAL | Status: DC
  Administered 2021-03-05: 60 mg
  Filled 2021-03-04 (×2): qty 10

## 2021-03-04 MED ORDER — SODIUM CHLORIDE 0.9 % IV SOLN: INTRAVENOUS | Status: DC

## 2021-03-04 MED ORDER — PANTOPRAZOLE 2 MG/ML SUSPENSION: 40.0000 mg | Freq: Every day | ORAL | Status: DC

## 2021-03-04 MED ORDER — DOCUSATE SODIUM 100 MG PO CAPS
100.0000 mg | ORAL_CAPSULE | Freq: Two times a day (BID) | ORAL | Status: DC
  Administered 2021-03-05: 100 mg via ORAL
  Filled 2021-03-04: qty 1

## 2021-03-04 MED ORDER — TENECTEPLASE FOR STROKE
PACK | INTRAVENOUS | Status: AC
  Filled 2021-03-04: qty 10

## 2021-03-04 MED ORDER — CLEVIDIPINE BUTYRATE 0.5 MG/ML IV EMUL
0.0000 mg/h | INTRAVENOUS | Status: DC
  Administered 2021-03-04: 8 mg/h via INTRAVENOUS
  Administered 2021-03-04: 1 mg/h via INTRAVENOUS
  Administered 2021-03-04 – 2021-03-05 (×2): 8 mg/h via INTRAVENOUS
  Administered 2021-03-05: 5 mg/h via INTRAVENOUS
  Administered 2021-03-05: 8 mg/h via INTRAVENOUS
  Administered 2021-03-05 (×2): 6 mg/h via INTRAVENOUS
  Administered 2021-03-06: 3 mg/h via INTRAVENOUS
  Filled 2021-03-04 (×9): qty 50

## 2021-03-04 MED ORDER — IOHEXOL 350 MG/ML SOLN
75.0000 mL | Freq: Once | INTRAVENOUS | Status: AC | PRN
  Administered 2021-03-04: 75 mL via INTRAVENOUS

## 2021-03-04 MED ORDER — SODIUM CHLORIDE 3 % IV SOLN
INTRAVENOUS | Status: DC
  Filled 2021-03-04 (×3): qty 500

## 2021-03-04 NOTE — ED Triage Notes (Signed)
Pt arrives via [facility] from home with new onset of right sided weakness at 1315 today. LKW 1100 am today. EMS activated code stroke prior to arrival. Hx of COPD

## 2021-03-04 NOTE — ED Provider Notes (Signed)
Emergency Department Provider Note   I have reviewed the triage vital signs and the nursing notes.   HISTORY  Chief Complaint Code Stroke   HPI Jeffery [name] is a 70 y.o. male past medical history reviewed below arrives to the emergency department by EMS as a code stroke.  Patient last normal at 11:00 AM.  He is arriving from home.  He is not anticoagulated.  Patient with the right side weakness, neglect, and speech disturbance.  He is awake and alert.  Level 5 caveat due to acute stroke symptoms.   Interview of the patient's chart he does not appear to be anticoagulated.  Has been seen in the emergency department this year for COPD as well as chest pain.   The patient's wife arrives approx 20 min after patient's arrival. She tells me that she last spoke to him around 11 AM on the phone and he seemed normal.  She came home around 12:45 PM to find him leaning to the right and struggling to use his inhaler properly.  She ultimately called EMS after discussing first with his hospice team.  She tells me that the patient is currently under hospice care.  He is DNR with a MOST form related to his end-stage COPD.   Past Medical History:  Diagnosis Date   Chest pain 04/2011   Normal echocardiogram   COPD (chronic obstructive pulmonary disease) (HCC)    Fasting hyperglycemia    Normal hemoglobin A1c of 6.0   GAD (generalized anxiety disorder)    GERD (gastroesophageal reflux disease)    Hyperlipemia    Hypertension    On home O2    2L N/C   On home O2    at night   Prediabetes 06/04/2015   Tobacco abuse     Patient Active Problem List   Diagnosis Date Noted   ICH (intracerebral hemorrhage) (HCC) 03/15/2021   COPD with acute exacerbation (HCC) 07/06/2020   Leukocytosis 02/18/2020   Protein-calorie malnutrition, severe 02/18/2020   On home O2    GERD (gastroesophageal reflux disease)    GAD (generalized anxiety disorder)    CAP (community acquired pneumonia) 02/17/2020    Bilateral pneumonia 02/17/2020   Weight loss 01/28/2019   COPD  GOLD ?  11/04/2018   Chronic respiratory failure with hypoxia (HCC) 10/04/2018   Acute on chronic respiratory failure with hypoxia (HCC) 11/07/2016   Prediabetes 06/04/2015   Hyperglycemia, drug-induced 06/03/2015   Essential hypertension 06/03/2015   COPD exacerbation (HCC) 06/02/2015   Hyperlipidemia 07/20/2011   Fasting hyperglycemia    Tobacco abuse    Chest pain 04/02/2011    Past Surgical History:  Procedure Laterality Date   APPENDECTOMY     APPENDECTOMY     CATARACT EXTRACTION W/PHACO Left 06/10/2019   Procedure: CATARACT EXTRACTION PHACO AND INTRAOCULAR LENS PLACEMENT (IOC);  Surgeon: Fabio Pierce, MD;  Location: [facility];  Service: Ophthalmology;  Laterality: Left;  CDE: 3.73   CATARACT EXTRACTION W/PHACO Right 06/24/2019   Procedure: CATARACT EXTRACTION PHACO AND INTRAOCULAR LENS PLACEMENT (IOC);  Surgeon: Fabio Pierce, MD;  Location: [facility];  Service: Ophthalmology;  Laterality: Right;  CDE: 4.42   LEFT HEART CATHETERIZATION WITH CORONARY ANGIOGRAM N/A 05/17/2011   Procedure: LEFT HEART CATHETERIZATION WITH CORONARY ANGIOGRAM;  Surgeon: Tonny Bollman, MD;  Location: [facility] C[facility];  Service: Cardiovascular;  Laterality: N/A;    Allergies Codeine  Family History  Problem Relation Age of Onset   Emphysema Father     Social History Social  History   Tobacco Use   Smoking status: Former    Packs/day: 1.00    Years: 35.00    Pack years: 35.00    Types: Cigarettes    Quit date: 05/09/2013    Years since quitting: 7.8   Smokeless tobacco: Never  Vaping Use   Vaping Use: Never used  Substance Use Topics   Alcohol use: No   Drug use: No    Review of Systems  Level 5 caveat: Acute CVA symptoms.   ____________________________________________   PHYSICAL EXAM:  VITAL SIGNS: Vitals:   03/26/2021 1500 03/10/2021 1502  BP: (!) 169/95   Pulse: 69 68  Resp: (!) 22 15  Temp:    SpO2: 100% 100%      Constitutional: Alert with right sided neglect. Following commands on the left.  Eyes: Conjunctivae are normal. Left gaze. Will not cross midline.  Head: Atraumatic. Nose: No congestion/rhinnorhea. Mouth/Throat: Mucous membranes are moist.   Neck: No stridor.  Cardiovascular: Normal rate, regular rhythm. Good peripheral circulation. Grossly normal heart sounds.   Respiratory: Normal respiratory effort.  No retractions. Lungs CTAB. Gastrointestinal: Soft and nontender. No distention.  Musculoskeletal: No lower extremity tenderness nor edema. No gross deformities of extremities. Neurologic: Positive dysarthria. Right hemiparesis with right sided neglect.  Skin:  Skin is warm, dry and intact. No rash noted.   ____________________________________________   LABS (all labs ordered are listed, but only abnormal results are displayed)  Labs Reviewed  CBC - Abnormal; Notable for the following components:      Result Value   RBC 3.94 (*)    Hemoglobin 10.6 (*)    HCT 35.2 (*)    RDW 15.9 (*)    All other components within normal limits  I-STAT CHEM 8, ED - Abnormal; Notable for the following components:   Chloride 91 (*)    Glucose, Bld 101 (*)    TCO2 37 (*)    Hemoglobin 10.9 (*)    HCT 32.0 (*)    All other components within normal limits  CBG MONITORING, ED - Abnormal; Notable for the following components:   Glucose-Capillary 104 (*)    All other components within normal limits  RESP PANEL BY RT-PCR (FLU A&B, COVID) ARPGX2  PROTIME-INR  APTT  DIFFERENTIAL  ETHANOL  COMPREHENSIVE METABOLIC PANEL  RAPID URINE DRUG SCREEN, HOSP PERFORMED  URINALYSIS, ROUTINE W REFLEX MICROSCOPIC  SODIUM  SODIUM   ____________________________________________  EKG   EKG Interpretation  Date/Time:  Thursday March 04 2021 14:24:15 EDT Ventricular Rate:  72 PR Interval:  169 QRS Duration: 90 QT Interval:  486 QTC Calculation: 532 R Axis:   72 Text Interpretation: Sinus rhythm  Probable left atrial enlargement Borderline low voltage, extremity leads Nonspecific T abnormalities, lateral leads Prolonged QT interval Baseline wander in lead(s) I V4 Confirmed by Nanda Quinton [phone]) on 03/11/2021 2:49:03 PM        ____________________________________________  RADIOLOGY  CT Angio Head W or Wo Contrast  Result Date: 03/19/2021 CLINICAL DATA:  Intracranial hemorrhage EXAM: CT ANGIOGRAPHY HEAD AND NECK TECHNIQUE: Multidetector CT imaging of the head and neck was performed using the standard protocol during bolus administration of intravenous contrast. Multiplanar CT image reconstructions and MIPs were obtained to evaluate the vascular anatomy. Carotid stenosis measurements (when applicable) are obtained utilizing NASCET criteria, using the distal internal carotid diameter as the denominator. CONTRAST:  21mL OMNIPAQUE IOHEXOL 350 MG/ML SOLN COMPARISON:  None. FINDINGS: CTA NECK Aortic arch: Great vessel origins are partially excluded. Visualized  portions are patent. Right carotid system: Patent. Mild calcified plaque along the proximal internal carotid with minimal stenosis. Left carotid system: Minimal calcified plaque along the proximal ICA without stenosis. The left ICA is smaller in caliber. Vertebral arteries: Patent.  Left vertebral artery is dominant. Skeleton: Cervical spine degenerative changes. Other neck: Unremarkable. Upper chest: Emphysema. Review of the MIP images confirms the above findings CTA HEAD Anterior circulation: Right intracranial internal carotid artery is patent with minor calcified plaque. Majority of the intracranial left internal carotid artery is patent but smaller in caliber. There is likely occlusion at the terminus. Right middle and anterior cerebral arteries are patent. Duplicated anterior communicating arteries. There are numerous collateral vessels present on the left. A 2 mm bulbous area of enhancement is present within the inferior area of hemorrhage  (series 6, image 103). This likely reflects culprit aneurysm. Posterior circulation: Intracranial vertebral arteries are patent. Basilar artery is patent. Major cerebellar artery origins are patent. Posterior cerebral arteries are patent. A left posterior communicating artery is present. Venous sinuses: Patent as allowed by contrast bolus timing. Review of the MIP images confirms the above findings IMPRESSION: Probable occlusion of the left carotid terminus with resulting moyamoya pattern of collaterals. A 2 mm presumed culprit aneurysm is present within the inferior area of hemorrhage. No hemodynamically significant stenosis in the neck. Emphysema. Electronically Signed   By: Macy Mis M.D.   On: 03/26/2021 14:21   CT Angio Neck W and/or Wo Contrast  Result Date: 03/20/2021 CLINICAL DATA:  Intracranial hemorrhage EXAM: CT ANGIOGRAPHY HEAD AND NECK TECHNIQUE: Multidetector CT imaging of the head and neck was performed using the standard protocol during bolus administration of intravenous contrast. Multiplanar CT image reconstructions and MIPs were obtained to evaluate the vascular anatomy. Carotid stenosis measurements (when applicable) are obtained utilizing NASCET criteria, using the distal internal carotid diameter as the denominator. CONTRAST:  58mL OMNIPAQUE IOHEXOL 350 MG/ML SOLN COMPARISON:  None. FINDINGS: CTA NECK Aortic arch: Great vessel origins are partially excluded. Visualized portions are patent. Right carotid system: Patent. Mild calcified plaque along the proximal internal carotid with minimal stenosis. Left carotid system: Minimal calcified plaque along the proximal ICA without stenosis. The left ICA is smaller in caliber. Vertebral arteries: Patent.  Left vertebral artery is dominant. Skeleton: Cervical spine degenerative changes. Other neck: Unremarkable. Upper chest: Emphysema. Review of the MIP images confirms the above findings CTA HEAD Anterior circulation: Right intracranial  internal carotid artery is patent with minor calcified plaque. Majority of the intracranial left internal carotid artery is patent but smaller in caliber. There is likely occlusion at the terminus. Right middle and anterior cerebral arteries are patent. Duplicated anterior communicating arteries. There are numerous collateral vessels present on the left. A 2 mm bulbous area of enhancement is present within the inferior area of hemorrhage (series 6, image 103). This likely reflects culprit aneurysm. Posterior circulation: Intracranial vertebral arteries are patent. Basilar artery is patent. Major cerebellar artery origins are patent. Posterior cerebral arteries are patent. A left posterior communicating artery is present. Venous sinuses: Patent as allowed by contrast bolus timing. Review of the MIP images confirms the above findings IMPRESSION: Probable occlusion of the left carotid terminus with resulting moyamoya pattern of collaterals. A 2 mm presumed culprit aneurysm is present within the inferior area of hemorrhage. No hemodynamically significant stenosis in the neck. Emphysema. Electronically Signed   By: Macy Mis M.D.   On: 03/10/2021 14:21   CT HEAD CODE STROKE WO CONTRAST  Result  Date: March 28, 2021 CLINICAL DATA:  Code stroke. EXAM: CT HEAD WITHOUT CONTRAST TECHNIQUE: Contiguous axial images were obtained from the base of the skull through the vertex without intravenous contrast. COMPARISON:  None. FINDINGS: Brain: Acute parenchymal hemorrhage in the left frontotemporal lobes measuring approximately 5.8 x 2.9 x 5.6 cm. Mild surrounding edema at this time. There is regional mass effect including effacement of the adjacent left lateral ventricle. There is mild rightward midline shift measuring approximately 5 mm. No evidence of ventricle trapping. Small volume intraventricular extension is present. Gray-white differentiation is preserved. Vascular: No hyperdense vessel. Mild intracranial  atherosclerotic calcification is present at the skull base. Skull: Unremarkable. Sinuses/Orbits: Aerated.  Orbits are unremarkable. Other: Mastoid air cells are clear. IMPRESSION: Acute parenchymal hemorrhage in the left frontotemporal lobes with small volume intraventricular extension. Regional mass effect including mild rightward midline shift. No hydrocephalus. These results were called by telephone at the time of interpretation on 03-28-21 at 1:59 pm to provider Rosabella Edgin , who verbally acknowledged these results. Electronically Signed   By: Guadlupe Spanish M.D.   On: Mar 28, 2021 14:02    ____________________________________________   PROCEDURES  Procedure(s) performed:   Procedures  CRITICAL CARE Performed by: Maia Plan Total critical care time: 75 minutes Critical care time was exclusive of separately billable procedures and treating other patients. Critical care was necessary to treat or prevent imminent or life-threatening deterioration. Critical care was time spent personally by me on the following activities: development of treatment plan with patient and/or surrogate as well as nursing, discussions with consultants, evaluation of patient's response to treatment, examination of patient, obtaining history from patient or surrogate, ordering and performing treatments and interventions, ordering and review of laboratory studies, ordering and review of radiographic studies, pulse oximetry and re-evaluation of patient's condition.  Alona Bene, MD Emergency Medicine  ____________________________________________   INITIAL IMPRESSION / ASSESSMENT AND PLAN / ED COURSE  Pertinent labs & imaging results that were available during my care of the patient were reviewed by me and considered in my medical decision making (see chart for details).   Patient arrives to the emergency department with stroke concern by EMS.  He arrives with right hemiparesis and neglect.  EMS transported the  patient immediately to CT with IV in place.  Activated code stroke. Differential includes CVA, ICH, SAH, LVO.   01:55 PM  Accompanied patient to CT. Patient with acute hemorrhage noted on CT.  We will continue with CTA of the head and neck and discuss with neurology.   Spoke with Dr. Otelia Limes by phone. Advises admit to Neurology. Placed temporary bed order to [facility] ICU at his request. Made Dr. Wilford Corner aware at [facility].   02:50 PM  CTA showing suspected culprit aneurysm at the base of the bleed. Will discuss with NSG.   03:05 PM  Spoke with Dr. Franky Macho. Can accept the patient to [facility] under his service. Have changed the bed request but the location can stay the same.   Gathered the patient's wife along with stepdaughter and other family at the bedside.  We discussed patient's goals of care and potential neurosurgical intervention offered at [facility].  The family feel that the patient would want intervention in this case despite his prior DNR status. Will continue with admit to NSG at [facility]. Turning off hypertonic saline per NSG request.  ____________________________________________  FINAL CLINICAL IMPRESSION(S) / ED DIAGNOSES  Final diagnoses:  Left-sided nontraumatic intracerebral hemorrhage, unspecified cerebral location [facility])    MEDICATIONS GIVEN DURING THIS  VISIT:  Medications  clevidipine (CLEVIPREX) infusion 0.5 mg/mL (3 mg/hr Intravenous Infusion Verify 03/19/2021 1447)  sodium chloride (hypertonic) 3 % solution ( Intravenous New Bag/Given 03/15/2021 1444)  iohexol (OMNIPAQUE) 350 MG/ML injection 75 mL (75 mLs Intravenous Contrast Given 03/28/2021 1359)    Note:  This document was prepared using Dragon voice recognition software and may include unintentional dictation errors.  Nanda Quinton, MD, [facility] Emergency Medicine    Guillermina Shaft, Wonda [facility], MD 03/03/2021 [phone]

## 2021-03-04 NOTE — H&P (Signed)
Jeffery [name] is an 70 y.o. male.   Chief Complaint: intracerebral hemorrhage HPI: was in his usual state of health until approximately 1100. He on exam at [facility] was found to have right sided weakness, neglect, and an expressive aphasia. Noted to be alert. At 1245 hid wife found him leaning to the right side, and unable to use his inhaler. He has end stage COPD, and is enrolled in home hospice care.  Transferred to [facility] due to suspicion of a possible aneurysmal etiology for the ICH.   Past Medical History:  Diagnosis Date   Chest pain 04/2011   Normal echocardiogram   COPD (chronic obstructive pulmonary disease) (HCC)    Fasting hyperglycemia    Normal hemoglobin A1c of 6.0   GAD (generalized anxiety disorder)    GERD (gastroesophageal reflux disease)    Hyperlipemia    Hypertension    On home O2    2L N/C   On home O2    at night   Prediabetes 06/04/2015   Tobacco abuse     Past Surgical History:  Procedure Laterality Date   APPENDECTOMY     APPENDECTOMY     CATARACT EXTRACTION W/PHACO Left 06/10/2019   Procedure: CATARACT EXTRACTION PHACO AND INTRAOCULAR LENS PLACEMENT (IOC);  Surgeon: Fabio Pierce, MD;  Location: [facility];  Service: Ophthalmology;  Laterality: Left;  CDE: 3.73   CATARACT EXTRACTION W/PHACO Right 06/24/2019   Procedure: CATARACT EXTRACTION PHACO AND INTRAOCULAR LENS PLACEMENT (IOC);  Surgeon: Fabio Pierce, MD;  Location: [facility];  Service: Ophthalmology;  Laterality: Right;  CDE: 4.42   LEFT HEART CATHETERIZATION WITH CORONARY ANGIOGRAM N/A 05/17/2011   Procedure: LEFT HEART CATHETERIZATION WITH CORONARY ANGIOGRAM;  Surgeon: Tonny Bollman, MD;  Location: [facility] CATH LAB;  Service: Cardiovascular;  Laterality: N/A;    Family History  Problem Relation Age of Onset   Emphysema Father    Social History:  reports that he quit smoking about 7 years ago. His smoking use included cigarettes. He has a 35.00 pack-year smoking history. He has never used smokeless  tobacco. He reports that he does not drink alcohol and does not use drugs.  Allergies:  Allergies  Allergen Reactions   Codeine Nausea Only    Medications Prior to Admission  Medication Sig Dispense Refill   albuterol (PROAIR HFA) 108 (90 Base) MCG/ACT inhaler Inhale 1-2 puffs into the lungs every 4 (four) hours as needed. (Patient taking differently: Inhale 1-2 puffs into the lungs every 4 (four) hours as needed for wheezing or shortness of breath.) 8 g 5   ALPRAZolam (XANAX) 0.5 MG tablet Take 0.5 mg by mouth 4 (four) times daily. Takes 1/2 tab in the morning and 1 tablet at bedtime     aspirin EC 81 MG tablet Take 81 mg by mouth daily.     atorvastatin (LIPITOR) 10 MG tablet Take 1 tablet (10 mg total) by mouth at bedtime. 30 tablet 1   famotidine (PEPCID) 40 MG tablet Take by mouth.     Fluticasone-Umeclidin-Vilant (TRELEGY ELLIPTA) 100-62.5-25 MCG/INH AEPB INHALE 1 PUFF INTO THE LUNGS EACH MORNING. 60 each 11   furosemide (LASIX) 20 MG tablet Take 10 mg by mouth daily.     gabapentin (NEURONTIN) 300 MG capsule Take 300 mg by mouth 3 (three) times daily.      ipratropium-albuterol (DUONEB) 0.5-2.5 (3) MG/3ML SOLN Take 3 mLs by nebulization every 6 (six) hours as needed.     linaclotide (LINZESS) 145 MCG CAPS capsule Take 145 mcg  by mouth daily before breakfast.     MATZIM LA 240 MG 24 hr tablet Take 240 mg by mouth daily.     metFORMIN (GLUCOPHAGE) 500 MG tablet Take 500 mg by mouth daily.     mirtazapine (REMERON) 15 MG tablet Take 7.5 mg by mouth at bedtime.     morphine 20 MG/5ML solution Take 0.1-0.25 mg by mouth every 2 (two) hours as needed for pain.     omeprazole (PRILOSEC) 20 MG capsule Take 20 mg by mouth daily.     ondansetron (ZOFRAN) 4 MG tablet Take 4 mg by mouth every 8 (eight) hours as needed for nausea or vomiting.     OXYGEN Inhale 3 L into the lungs daily. 2lpm 24/7     polyethylene glycol (MIRALAX / GLYCOLAX) 17 g packet Take 17 g by mouth daily.     predniSONE  (DELTASONE) 5 MG tablet Take 1 tablet (5 mg total) by mouth daily with breakfast. Start taking it again after completion of acute tapering prescription. (Patient taking differently: Take 5 mg by mouth daily with breakfast.)     senna (SENOKOT) 8.6 MG TABS tablet Take 1 tablet by mouth daily as needed for mild constipation.     albuterol (PROVENTIL) (2.5 MG/3ML) 0.083% nebulizer solution Take 3 mLs (2.5 mg total) by nebulization every 4 (four) hours as needed for wheezing or shortness of breath. (Patient not taking: No sig reported) 75 mL 12   ALPRAZolam (XANAX) 0.5 MG tablet Take by mouth. (Patient not taking: No sig reported)     aspirin 81 MG EC tablet Take by mouth. (Patient not taking: No sig reported)     Budeson-Glycopyrrol-Formoterol (BREZTRI AEROSPHERE) 160-9-4.8 MCG/ACT AERO Inhale into the lungs 2 (two) times daily. (Patient not taking: Reported on 03/21/2021)     famotidine (PEPCID) 40 MG tablet 1 daily after supper (Patient not taking: No sig reported)     gabapentin (NEURONTIN) 300 MG capsule Take by mouth.     isosorbide mononitrate (IMDUR) 30 MG 24 hr tablet Take 30 mg by mouth daily. (Patient not taking: Reported on 03/03/2021)  1   LORazepam (ATIVAN) 1 MG tablet Take 1 mg by mouth 3 (three) times daily.     metFORMIN (GLUCOPHAGE) 500 MG tablet Take by mouth. (Patient not taking: No sig reported)     mirtazapine (REMERON) 15 MG tablet Take by mouth. (Patient not taking: No sig reported)     Multiple Vitamin (MULTI-VITAMINS) TABS Take by mouth. (Patient not taking: No sig reported)     Multiple Vitamin (MULTIVITAMIN WITH MINERALS) TABS tablet Take 1 tablet by mouth daily. (Patient not taking: Reported on 03/16/2021)     omeprazole (PRILOSEC) 20 MG capsule Take by mouth. (Patient not taking: No sig reported)     ondansetron (ZOFRAN) 4 MG tablet Take by mouth. (Patient not taking: No sig reported)     Potassium Chloride ER 20 MEQ TBCR Take by mouth. (Patient not taking: No sig reported)      potassium chloride SA (K-DUR,KLOR-CON) 20 MEQ tablet Take 20 mEq by mouth daily.  (Patient not taking: Reported on 03/31/2021)  1   rosuvastatin (CRESTOR) 5 MG tablet Take 5 mg by mouth daily. (Patient not taking: No sig reported)     simvastatin (ZOCOR) 20 MG tablet Take 20 mg by mouth at bedtime. (Patient not taking: No sig reported)     sucralfate (CARAFATE) 1 g tablet Take 1 g by mouth 2 (two) times daily.  (Patient not taking:  Reported on 03/11/2021)     telmisartan (MICARDIS) 40 MG tablet TAKE 1 TABLET BY MOUTH ONCE A DAY. (Patient not taking: No sig reported) 30 tablet 3    Results for orders placed or performed during the hospital encounter of 03/14/2021 (from the past 48 hour(s))  CBG monitoring, ED     Status: Abnormal   Collection Time: 03/15/2021  2:05 PM  Result Value Ref Range   Glucose-Capillary 104 (H) 70 - 99 mg/dL    Comment: Glucose reference range applies only to samples taken after fasting for at least 8 hours.  Ethanol     Status: None   Collection Time: 03/05/2021  2:13 PM  Result Value Ref Range   Alcohol, Ethyl (B) <10 <10 mg/dL    Comment: Performed at [facility], [address], [place], [place] [address]  Protime-INR     Status: None   Collection Time: 03/28/2021  2:13 PM  Result Value Ref Range   Prothrombin Time 13.9 11.4 - 15.2 seconds   INR 1.1 0.8 - 1.2    Comment: (NOTE) INR goal varies based on device and disease states. Performed at [facility], [address], [place], [place] [address]   APTT     Status: None   Collection Time: 03/23/2021  2:13 PM  Result Value Ref Range   aPTT 32 24 - 36 seconds    Comment: Performed at [facility], [address], [place], [place] [address]  CBC     Status: Abnormal   Collection Time: 03/22/2021  2:13 PM  Result Value Ref Range   WBC 8.0 4.0 - 10.5 K/uL   RBC 3.94 (L) 4.22 - 5.81 MIL/uL   Hemoglobin 10.6 (L) 13.0 - 17.0 g/dL   HCT 35.2 (L) 39.0 - 52.0 %   MCV 89.3 80.0 - 100.0 fL   MCH 26.9 26.0 - 34.0 pg   MCHC  30.1 30.0 - 36.0 g/dL   RDW 15.9 (H) 11.5 - 15.5 %   Platelets 234 150 - 400 K/uL   nRBC 0.0 0.0 - 0.2 %    Comment: Performed at [facility], [address], [place], [place] [address]  Differential     Status: None   Collection Time: 03/13/2021  2:13 PM  Result Value Ref Range   Neutrophils Relative % 82 %   Neutro Abs 6.6 1.7 - 7.7 K/uL   Lymphocytes Relative 9 %   Lymphs Abs 0.7 0.7 - 4.0 K/uL   Monocytes Relative 6 %   Monocytes Absolute 0.5 0.1 - 1.0 K/uL   Eosinophils Relative 3 %   Eosinophils Absolute 0.2 0.0 - 0.5 K/uL   Basophils Relative 0 %   Basophils Absolute 0.0 0.0 - 0.1 K/uL   Immature Granulocytes 0 %   Abs Immature Granulocytes 0.03 0.00 - 0.07 K/uL    Comment: Performed at [facility], [address] [place], [place] [address]  Comprehensive metabolic panel     Status: Abnormal   Collection Time: 03/10/2021  2:13 PM  Result Value Ref Range   Sodium 138 135 - 145 mmol/L   Potassium 3.7 3.5 - 5.1 mmol/L   Chloride 95 (L) 98 - 111 mmol/L   CO2 36 (H) 22 - 32 mmol/L   Glucose, Bld 107 (H) 70 - 99 mg/dL    Comment: Glucose reference range applies only to samples taken after fasting for at least 8 hours.   BUN 15 8 - 23 mg/dL   Creatinine, Ser 0.62 0.61 -  1.24 mg/dL   Calcium 8.9 8.9 - 10.3 mg/dL   Total Protein 6.2 (L) 6.5 - 8.1 g/dL   Albumin 3.7 3.5 - 5.0 g/dL   AST 15 15 - 41 U/L   ALT 13 0 - 44 U/L   Alkaline Phosphatase 54 38 - 126 U/L   Total Bilirubin 0.5 0.3 - 1.2 mg/dL   GFR, Estimated >60 >60 mL/min    Comment: (NOTE) Calculated using the CKD-EPI Creatinine Equation (2021)    Anion gap 7 5 - 15    Comment: Performed at [facility], [address] [place], [place] [address]  I-stat chem 8, ED     Status: Abnormal   Collection Time: 03/28/2021  2:35 PM  Result Value Ref Range   Sodium 137 135 - 145 mmol/L   Potassium 3.6 3.5 - 5.1 mmol/L   Chloride 91 (L) 98 - 111 mmol/L   BUN 14 8 - 23 mg/dL   Creatinine, Ser 0.70 0.61 - 1.24 mg/dL   Glucose,  Bld 101 (H) 70 - 99 mg/dL    Comment: Glucose reference range applies only to samples taken after fasting for at least 8 hours.   Calcium, Ion 1.15 1.15 - 1.40 mmol/L   TCO2 37 (H) 22 - 32 mmol/L   Hemoglobin 10.9 (L) 13.0 - 17.0 g/dL   HCT 32.0 (L) 39.0 - 52.0 %  Resp Panel by RT-PCR (Flu A&B, Covid) Nasopharyngeal Swab     Status: None   Collection Time: 03/28/2021  2:44 PM   Specimen: Nasopharyngeal Swab; Nasopharyngeal(NP) swabs in vial transport medium  Result Value Ref Range   SARS Coronavirus 2 by RT PCR NEGATIVE NEGATIVE    Comment: (NOTE) SARS-CoV-2 target nucleic acids are NOT DETECTED.  The SARS-CoV-2 RNA is generally detectable in upper respiratory specimens during the acute phase of infection. The lowest concentration of SARS-CoV-2 viral copies this assay can detect is 138 copies/mL. A negative result does not preclude SARS-Cov-2 infection and should not be used as the sole basis for treatment or other patient management decisions. A negative result may occur with  improper specimen collection/handling, submission of specimen other than nasopharyngeal swab, presence of viral mutation(s) within the areas targeted by this assay, and inadequate number of viral copies(<138 copies/mL). A negative result must be combined with clinical observations, patient history, and epidemiological information. The expected result is Negative.  Fact Sheet for Patients:  EntrepreneurPulse.com.au  Fact Sheet for Healthcare Providers:  IncredibleEmployment.be  This test is no t yet approved or cleared by the [place] FDA and  has been authorized for detection and/or diagnosis of SARS-CoV-2 by FDA under an Emergency Use Authorization (EUA). This EUA will remain  in effect (meaning this test can be used) for the duration of the COVID-19 declaration under Section 564(b)(1) of the Act, 21 U.S.C.section 360bbb-3(b)(1), unless the authorization is  terminated  or revoked sooner.       Influenza A by PCR NEGATIVE NEGATIVE   Influenza B by PCR NEGATIVE NEGATIVE    Comment: (NOTE) The Xpert Xpress SARS-CoV-2/FLU/RSV plus assay is intended as an aid in the diagnosis of influenza from Nasopharyngeal swab specimens and should not be used as a sole basis for treatment. Nasal washings and aspirates are unacceptable for Xpert Xpress SARS-CoV-2/FLU/RSV testing.  Fact Sheet for Patients: EntrepreneurPulse.com.au  Fact Sheet for Healthcare Providers: IncredibleEmployment.be  This test is not yet approved or cleared by the [place] FDA and has been authorized for detection and/or diagnosis of SARS-CoV-2  by FDA under an Emergency Use Authorization (EUA). This EUA will remain in effect (meaning this test can be used) for the duration of the COVID-19 declaration under Section 564(b)(1) of the Act, 21 U.S.C. section 360bbb-3(b)(1), unless the authorization is terminated or revoked.  Performed at [facility], [address] [place], [place] [address]   Urine rapid drug screen (hosp performed)     Status: None   Collection Time: 03/30/2021  2:46 PM  Result Value Ref Range   Opiates NONE DETECTED NONE DETECTED   Cocaine NONE DETECTED NONE DETECTED   Benzodiazepines NONE DETECTED NONE DETECTED   Amphetamines NONE DETECTED NONE DETECTED   Tetrahydrocannabinol NONE DETECTED NONE DETECTED   Barbiturates NONE DETECTED NONE DETECTED    Comment: (NOTE) DRUG SCREEN FOR MEDICAL PURPOSES ONLY.  IF CONFIRMATION IS NEEDED FOR ANY PURPOSE, NOTIFY LAB WITHIN 5 DAYS.  LOWEST DETECTABLE LIMITS FOR URINE DRUG SCREEN Drug Class                     Cutoff (ng/mL) Amphetamine and metabolites    1000 Barbiturate and metabolites    200 Benzodiazepine                 [id number] Tricyclics and metabolites     300 Opiates and metabolites        300 Cocaine and metabolites        300 THC                             50 Performed at [facility], [address] [place], [place] [address]   Urinalysis, Routine w reflex microscopic Urine, Clean Catch     Status: Abnormal   Collection Time: 03/09/2021  2:46 PM  Result Value Ref Range   Color, Urine STRAW (A) YELLOW   APPearance CLEAR CLEAR   Specific Gravity, Urine 1.012 1.005 - 1.030   pH 7.0 5.0 - 8.0   Glucose, UA NEGATIVE NEGATIVE mg/dL   Hgb urine dipstick SMALL (A) NEGATIVE   Bilirubin Urine NEGATIVE NEGATIVE   Ketones, ur NEGATIVE NEGATIVE mg/dL   Protein, ur NEGATIVE NEGATIVE mg/dL   Nitrite NEGATIVE NEGATIVE   Leukocytes,Ua NEGATIVE NEGATIVE   WBC, UA 0-5 0 - 5 WBC/hpf   Bacteria, UA NONE SEEN NONE SEEN    Comment: Performed at [facility], [address], [place], [place] [address]   CT Angio Head W or Wo Contrast  Result Date: 03/02/2021 CLINICAL DATA:  Intracranial hemorrhage EXAM: CT ANGIOGRAPHY HEAD AND NECK TECHNIQUE: Multidetector CT imaging of the head and neck was performed using the standard protocol during bolus administration of intravenous contrast. Multiplanar CT image reconstructions and MIPs were obtained to evaluate the vascular anatomy. Carotid stenosis measurements (when applicable) are obtained utilizing NASCET criteria, using the distal internal carotid diameter as the denominator. CONTRAST:  36mL OMNIPAQUE IOHEXOL 350 MG/ML SOLN COMPARISON:  None. FINDINGS: CTA NECK Aortic arch: Great vessel origins are partially excluded. Visualized portions are patent. Right carotid system: Patent. Mild calcified plaque along the proximal internal carotid with minimal stenosis. Left carotid system: Minimal calcified plaque along the proximal ICA without stenosis. The left ICA is smaller in caliber. Vertebral arteries: Patent.  Left vertebral artery is dominant. Skeleton: Cervical spine degenerative changes. Other neck: Unremarkable. Upper chest: Emphysema. Review of the MIP images confirms the above findings CTA HEAD Anterior circulation: Right  intracranial internal carotid artery is patent with minor calcified plaque.  Majority of the intracranial left internal carotid artery is patent but smaller in caliber. There is likely occlusion at the terminus. Right middle and anterior cerebral arteries are patent. Duplicated anterior communicating arteries. There are numerous collateral vessels present on the left. A 2 mm bulbous area of enhancement is present within the inferior area of hemorrhage (series 6, image 103). This likely reflects culprit aneurysm. Posterior circulation: Intracranial vertebral arteries are patent. Basilar artery is patent. Major cerebellar artery origins are patent. Posterior cerebral arteries are patent. A left posterior communicating artery is present. Venous sinuses: Patent as allowed by contrast bolus timing. Review of the MIP images confirms the above findings IMPRESSION: Probable occlusion of the left carotid terminus with resulting moyamoya pattern of collaterals. A 2 mm presumed culprit aneurysm is present within the inferior area of hemorrhage. No hemodynamically significant stenosis in the neck. Emphysema. Electronically Signed   By: Guadlupe Spanish M.D.   On: March 28, 2021 14:21   CT Angio Neck W and/or Wo Contrast  Result Date: 03-28-21 CLINICAL DATA:  Intracranial hemorrhage EXAM: CT ANGIOGRAPHY HEAD AND NECK TECHNIQUE: Multidetector CT imaging of the head and neck was performed using the standard protocol during bolus administration of intravenous contrast. Multiplanar CT image reconstructions and MIPs were obtained to evaluate the vascular anatomy. Carotid stenosis measurements (when applicable) are obtained utilizing NASCET criteria, using the distal internal carotid diameter as the denominator. CONTRAST:  3mL OMNIPAQUE IOHEXOL 350 MG/ML SOLN COMPARISON:  None. FINDINGS: CTA NECK Aortic arch: Great vessel origins are partially excluded. Visualized portions are patent. Right carotid system: Patent. Mild calcified  plaque along the proximal internal carotid with minimal stenosis. Left carotid system: Minimal calcified plaque along the proximal ICA without stenosis. The left ICA is smaller in caliber. Vertebral arteries: Patent.  Left vertebral artery is dominant. Skeleton: Cervical spine degenerative changes. Other neck: Unremarkable. Upper chest: Emphysema. Review of the MIP images confirms the above findings CTA HEAD Anterior circulation: Right intracranial internal carotid artery is patent with minor calcified plaque. Majority of the intracranial left internal carotid artery is patent but smaller in caliber. There is likely occlusion at the terminus. Right middle and anterior cerebral arteries are patent. Duplicated anterior communicating arteries. There are numerous collateral vessels present on the left. A 2 mm bulbous area of enhancement is present within the inferior area of hemorrhage (series 6, image 103). This likely reflects culprit aneurysm. Posterior circulation: Intracranial vertebral arteries are patent. Basilar artery is patent. Major cerebellar artery origins are patent. Posterior cerebral arteries are patent. A left posterior communicating artery is present. Venous sinuses: Patent as allowed by contrast bolus timing. Review of the MIP images confirms the above findings IMPRESSION: Probable occlusion of the left carotid terminus with resulting moyamoya pattern of collaterals. A 2 mm presumed culprit aneurysm is present within the inferior area of hemorrhage. No hemodynamically significant stenosis in the neck. Emphysema. Electronically Signed   By: Guadlupe Spanish M.D.   On: 2021-03-28 14:21   CT HEAD CODE STROKE WO CONTRAST  Result Date: 2021/03/28 CLINICAL DATA:  Code stroke. EXAM: CT HEAD WITHOUT CONTRAST TECHNIQUE: Contiguous axial images were obtained from the base of the skull through the vertex without intravenous contrast. COMPARISON:  None. FINDINGS: Brain: Acute parenchymal hemorrhage in the left  frontotemporal lobes measuring approximately 5.8 x 2.9 x 5.6 cm. Mild surrounding edema at this time. There is regional mass effect including effacement of the adjacent left lateral ventricle. There is mild rightward midline shift measuring approximately 5 mm. No  evidence of ventricle trapping. Small volume intraventricular extension is present. Gray-white differentiation is preserved. Vascular: No hyperdense vessel. Mild intracranial atherosclerotic calcification is present at the skull base. Skull: Unremarkable. Sinuses/Orbits: Aerated.  Orbits are unremarkable. Other: Mastoid air cells are clear. IMPRESSION: Acute parenchymal hemorrhage in the left frontotemporal lobes with small volume intraventricular extension. Regional mass effect including mild rightward midline shift. No hydrocephalus. These results were called by telephone at the time of interpretation on 03/31/2021 at 1:59 pm to provider JOSHUA LONG , who verbally acknowledged these results. Electronically Signed   By: Macy Mis M.D.   On: 03/27/2021 14:02    Review of Systems  Constitutional: Negative.   HENT:  Positive for dental problem.   Eyes: Negative.   Respiratory:  Positive for shortness of breath and stridor.   Cardiovascular: Negative.   Gastrointestinal: Negative.   Endocrine: Negative.   Genitourinary: Negative.   Musculoskeletal: Negative.   Skin: Negative.   Allergic/Immunologic: Negative.   Neurological:  Positive for speech difficulty and weakness.  Hematological: Negative.   Psychiatric/Behavioral: Negative.     Blood pressure 129/69, pulse 72, temperature 98.3 F (36.8 C), resp. rate 13, height 5\' 6"  (1.676 m), weight 48.6 kg, SpO2 100 %. Physical Exam Constitutional:      Comments: underweight  HENT:     Head: Normocephalic and atraumatic.     Right Ear: External ear normal.     Left Ear: External ear normal.     Nose: Nose normal.     Mouth/Throat:     Mouth: Mucous membranes are moist.     Pharynx:  Oropharynx is clear.  Eyes:     Pupils: Pupils are equal, round, and reactive to light.     Comments: Right neglect  Cardiovascular:     Rate and Rhythm: Normal rate and regular rhythm.  Pulmonary:     Effort: Pulmonary effort is normal.  Abdominal:     General: Abdomen is flat.     Palpations: Abdomen is soft.  Musculoskeletal:     Cervical back: Normal range of motion.  Neurological:     Mental Status: He is alert.     Cranial Nerves: Dysarthria and facial asymmetry present.     Motor: Weakness present.     Coordination: Coordination abnormal.     Deep Tendon Reflexes: Babinski sign absent on the right side. Babinski sign absent on the left side.     Comments: Right sided plegia Gait not assessed Poor coordination on the right side     Assessment/Plan TEL CATTON is a 70 y.o. male Who is DNR and on home hospice due to Stage lV COPD. He has exceedingly poor lung function. I am skeptical of the 35mm aneurysm, and if it indeed is present it would not be capable of being treated via endovascular means. A craniotomy is not indicated per the family. We will treat the blood pressure, and start therapy. But there is no option for surgery, which I agree with quite strongly.   Ashok Pall, MD 03/19/2021, 7:20 PM

## 2021-03-04 NOTE — Progress Notes (Signed)
Code stroke 1351 beeper 1350 exam started 1351 exam finished 1351 images sent to soc 1352 exam completed 1352  radiology

## 2021-03-04 NOTE — Progress Notes (Signed)
Pt NPO and failed swallow screen. Ok to hold scheduled nimotop per Dr. Franky Macho

## 2021-03-05 ENCOUNTER — Inpatient Hospital Stay (HOSPITAL_COMMUNITY): Payer: Medicare Other

## 2021-03-05 DIAGNOSIS — J449 Chronic obstructive pulmonary disease, unspecified: Secondary | ICD-10-CM

## 2021-03-05 DIAGNOSIS — I610 Nontraumatic intracerebral hemorrhage in hemisphere, subcortical: Principal | ICD-10-CM

## 2021-03-05 DIAGNOSIS — I10 Essential (primary) hypertension: Secondary | ICD-10-CM

## 2021-03-05 MED ORDER — ORAL CARE MOUTH RINSE
15.0000 mL | Freq: Two times a day (BID) | OROMUCOSAL | Status: DC
  Administered 2021-03-05: 15 mL via OROMUCOSAL

## 2021-03-05 MED ORDER — CHLORHEXIDINE GLUCONATE CLOTH 2 % EX PADS
6.0000 | MEDICATED_PAD | Freq: Every day | CUTANEOUS | Status: DC
  Administered 2021-03-05: 6 via TOPICAL

## 2021-03-05 MED ORDER — AMLODIPINE BESYLATE 10 MG PO TABS
10.0000 mg | ORAL_TABLET | Freq: Every day | ORAL | Status: DC
  Administered 2021-03-05: 10 mg via ORAL
  Filled 2021-03-05: qty 1

## 2021-03-05 MED ORDER — UMECLIDINIUM BROMIDE 62.5 MCG/ACT IN AEPB
1.0000 | INHALATION_SPRAY | Freq: Every day | RESPIRATORY_TRACT | Status: DC
  Filled 2021-03-05: qty 7

## 2021-03-05 MED ORDER — IPRATROPIUM-ALBUTEROL 0.5-2.5 (3) MG/3ML IN SOLN: 3.0000 mL | Freq: Four times a day (QID) | RESPIRATORY_TRACT | Status: DC | PRN

## 2021-03-05 MED ORDER — EMPTY CONTAINERS FLEXIBLE MISC
25.0000 g | Freq: Once | Status: AC
  Administered 2021-03-05: 25 g via INTRAVENOUS
  Filled 2021-03-05 (×2): qty 100

## 2021-03-05 MED ORDER — FLUTICASONE-UMECLIDIN-VILANT 100-62.5-25 MCG/ACT IN AEPB: 1.0000 | INHALATION_SPRAY | Freq: Every day | RESPIRATORY_TRACT | Status: DC

## 2021-03-05 MED ORDER — LISINOPRIL 20 MG PO TABS
20.0000 mg | ORAL_TABLET | Freq: Every day | ORAL | Status: DC
  Administered 2021-03-05: 20 mg via ORAL
  Filled 2021-03-05: qty 1

## 2021-03-05 MED ORDER — FLUTICASONE FUROATE-VILANTEROL 100-25 MCG/ACT IN AEPB
1.0000 | INHALATION_SPRAY | Freq: Every day | RESPIRATORY_TRACT | Status: DC
  Filled 2021-03-05: qty 28

## 2021-03-05 NOTE — Consult Note (Signed)
Stroke Neurology Consultation Note  Consult Requested by: Dr. Christella Noa  Reason for Consult: [place]  Consult Date: 03/05/21   The history was obtained from the wife.  During history and examination, all items were able to obtain unless otherwise noted.  History of Present Illness:  Jeffery [name] is a 70 y.o. African American male with PMH of end-stage COPD on hospice care, hypertension, hyperlipidemia, former smoker admitted for ICH.  Per wife, patient was at his baseline yesterday 11 AM on the phone.  When she got home around 12:45 PM and patient was found to have right-sided weakness, slurred speech and aphasia.  EMS was called and he was brought to [facility] for evaluation.  BP 152/86, glucose 104.  CT showed left frontal parietal subcortical moderate sized ICH, CT head and neck showed left terminal ICA occlusion with moyamoya pattern on the left, consistent with chronic occlusion.  Patient admitted to ICU by neurosurgery for BP measurement.  Currently on Cleviprex, just passed a swallow, on dysphagia 1 diet.  Per wife, patient had end-stage he has already on hospice care, she is okay with CT, MRI and 2D echo as well as blood draw, however patient DNR, and no feeding tube.  Wife denies any history of stroke in the past.  LSN: 11 AM on 11 06/2020 tPA Given: No: ICH  Past Medical History:  Diagnosis Date   Chest pain 04/2011   Normal echocardiogram   COPD (chronic obstructive pulmonary disease) (HCC)    Fasting hyperglycemia    Normal hemoglobin A1c of 6.0   GAD (generalized anxiety disorder)    GERD (gastroesophageal reflux disease)    Hyperlipemia    Hypertension    On home O2    2L N/C   On home O2    at night   Prediabetes 06/04/2015   Tobacco abuse     Past Surgical History:  Procedure Laterality Date   APPENDECTOMY     APPENDECTOMY     CATARACT EXTRACTION W/PHACO Left 06/10/2019   Procedure: CATARACT EXTRACTION PHACO AND INTRAOCULAR LENS PLACEMENT ([place]);  Surgeon: Baruch Goldmann,  MD;  Location: [facility];  Service: Ophthalmology;  Laterality: Left;  CDE: 3.73   CATARACT EXTRACTION W/PHACO Right 06/24/2019   Procedure: CATARACT EXTRACTION PHACO AND INTRAOCULAR LENS PLACEMENT ([facility]);  Surgeon: Baruch Goldmann, MD;  Location: [facility];  Service: Ophthalmology;  Laterality: Right;  CDE: 4.42   LEFT HEART CATHETERIZATION WITH CORONARY ANGIOGRAM N/A 05/17/2011   Procedure: LEFT HEART CATHETERIZATION WITH CORONARY ANGIOGRAM;  Surgeon: Sherren Mocha, MD;  Location: [facility] CATH LAB;  Service: Cardiovascular;  Laterality: N/A;    Family History  Problem Relation Age of Onset   Emphysema Father     Social History:  reports that he quit smoking about 7 years ago. His smoking use included cigarettes. He has a 35.00 pack-year smoking history. He has never used smokeless tobacco. He reports that he does not drink alcohol and does not use drugs.  Allergies:  Allergies  Allergen Reactions   Codeine Nausea Only    No current facility-administered medications on file prior to encounter.   Current Outpatient Medications on File Prior to Encounter  Medication Sig Dispense Refill   albuterol (PROAIR HFA) 108 (90 Base) MCG/ACT inhaler Inhale 1-2 puffs into the lungs every 4 (four) hours as needed. (Patient taking differently: Inhale 1-2 puffs into the lungs every 4 (four) hours as needed for wheezing or shortness of breath.) 8 g 5   ALPRAZolam (XANAX) 0.5 MG tablet Take  0.5 mg by mouth 4 (four) times daily. Takes 1/2 tab in the morning and 1 tablet at bedtime     aspirin EC 81 MG tablet Take 81 mg by mouth daily.     atorvastatin (LIPITOR) 10 MG tablet Take 1 tablet (10 mg total) by mouth at bedtime. 30 tablet 1   famotidine (PEPCID) 40 MG tablet Take by mouth.     Fluticasone-Umeclidin-Vilant (TRELEGY ELLIPTA) 100-62.5-25 MCG/INH AEPB INHALE 1 PUFF INTO THE LUNGS EACH MORNING. 60 each 11   furosemide (LASIX) 20 MG tablet Take 10 mg by mouth daily.     gabapentin (NEURONTIN) 300 MG capsule Take  300 mg by mouth 3 (three) times daily.      ipratropium-albuterol (DUONEB) 0.5-2.5 (3) MG/3ML SOLN Take 3 mLs by nebulization every 6 (six) hours as needed.     linaclotide (LINZESS) 145 MCG CAPS capsule Take 145 mcg by mouth daily before breakfast.     MATZIM LA 240 MG 24 hr tablet Take 240 mg by mouth daily.     metFORMIN (GLUCOPHAGE) 500 MG tablet Take 500 mg by mouth daily.     mirtazapine (REMERON) 15 MG tablet Take 7.5 mg by mouth at bedtime.     morphine 20 MG/5ML solution Take 0.1-0.25 mg by mouth every 2 (two) hours as needed for pain.     omeprazole (PRILOSEC) 20 MG capsule Take 20 mg by mouth daily.     ondansetron (ZOFRAN) 4 MG tablet Take 4 mg by mouth every 8 (eight) hours as needed for nausea or vomiting.     OXYGEN Inhale 3 L into the lungs daily. 2lpm 24/7     polyethylene glycol (MIRALAX / GLYCOLAX) 17 g packet Take 17 g by mouth daily.     predniSONE (DELTASONE) 5 MG tablet Take 1 tablet (5 mg total) by mouth daily with breakfast. Start taking it again after completion of acute tapering prescription. (Patient taking differently: Take 5 mg by mouth daily with breakfast.)     senna (SENOKOT) 8.6 MG TABS tablet Take 1 tablet by mouth daily as needed for mild constipation.     albuterol (PROVENTIL) (2.5 MG/3ML) 0.083% nebulizer solution Take 3 mLs (2.5 mg total) by nebulization every 4 (four) hours as needed for wheezing or shortness of breath. (Patient not taking: No sig reported) 75 mL 12   ALPRAZolam (XANAX) 0.5 MG tablet Take by mouth. (Patient not taking: No sig reported)     aspirin 81 MG EC tablet Take by mouth. (Patient not taking: No sig reported)     Budeson-Glycopyrrol-Formoterol (BREZTRI AEROSPHERE) 160-9-4.8 MCG/ACT AERO Inhale into the lungs 2 (two) times daily. (Patient not taking: Reported on 03/10/2021)     famotidine (PEPCID) 40 MG tablet 1 daily after supper (Patient not taking: No sig reported)     gabapentin (NEURONTIN) 300 MG capsule Take by mouth.      isosorbide mononitrate (IMDUR) 30 MG 24 hr tablet Take 30 mg by mouth daily. (Patient not taking: Reported on 03/08/2021)  1   LORazepam (ATIVAN) 1 MG tablet Take 1 mg by mouth 3 (three) times daily.     metFORMIN (GLUCOPHAGE) 500 MG tablet Take by mouth. (Patient not taking: No sig reported)     mirtazapine (REMERON) 15 MG tablet Take by mouth. (Patient not taking: No sig reported)     Multiple Vitamin (MULTI-VITAMINS) TABS Take by mouth. (Patient not taking: No sig reported)     Multiple Vitamin (MULTIVITAMIN WITH MINERALS) TABS tablet Take 1 tablet  by mouth daily. (Patient not taking: Reported on 03/19/2021)     omeprazole (PRILOSEC) 20 MG capsule Take by mouth. (Patient not taking: No sig reported)     ondansetron (ZOFRAN) 4 MG tablet Take by mouth. (Patient not taking: No sig reported)     Potassium Chloride ER 20 MEQ TBCR Take by mouth. (Patient not taking: No sig reported)     potassium chloride SA (K-DUR,KLOR-CON) 20 MEQ tablet Take 20 mEq by mouth daily.  (Patient not taking: Reported on 03/02/2021)  1   rosuvastatin (CRESTOR) 5 MG tablet Take 5 mg by mouth daily. (Patient not taking: No sig reported)     simvastatin (ZOCOR) 20 MG tablet Take 20 mg by mouth at bedtime. (Patient not taking: No sig reported)     sucralfate (CARAFATE) 1 g tablet Take 1 g by mouth 2 (two) times daily.  (Patient not taking: Reported on 03/05/2021)     telmisartan (MICARDIS) 40 MG tablet TAKE 1 TABLET BY MOUTH ONCE A DAY. (Patient not taking: No sig reported) 30 tablet 3    Review of Systems: A full ROS was attempted today and was able to be performed.  Systems assessed include - Constitutional, Eyes, HENT, Respiratory, Cardiovascular, Gastrointestinal, Genitourinary, Integument/breast, Hematologic/lymphatic, Musculoskeletal, Neurological, Behavioral/Psych, Endocrine, Allergic/Immunologic - with pertinent responses as per HPI.  Physical Examination: Temp:  [97.6 F (36.4 C)-99.2 F (37.3 C)] 98.7 F (37.1 C)  (11/04 0800) Pulse Rate:  [68-86] 85 (11/04 1000) Resp:  [7-30] 24 (11/04 1000) BP: (118-171)/(65-96) 142/79 (11/04 1000) SpO2:  [95 %-100 %] 100 % (11/04 1000) Weight:  [48.6 kg-59.6 kg] 48.6 kg (11/03 1700)  General - well nourished, well developed, in no apparent distress but lethargic.    Ophthalmologic - fundi not visualized due to noncooperation.    Cardiovascular - regular rhythm and rate  Neuro - awake, alert, lethargic, eyes open, orientated to age and people, but orientated to place or time. Intermittent word salad and intangible words, but with meaningful words, he has moderate to severe dysarthria, not able to name or repeat, however follows some commands.  Psychomotor slowing. No gaze palsy, tracking bilaterally, blinking to visual threat bilaterally, PERRL.  Mild right facial droop. Tongue midline.  Left upper extremity 4/5, no drift, left upper extremity 3/5 with mild drift, left finger grip 3/5 compared with right finger grip 4+/5. Sensation exam not corporative, left FTN grossly intact, gait not tested.    Data Reviewed: CT Angio Head W or Wo Contrast  Result Date: 03/08/2021 CLINICAL DATA:  Intracranial hemorrhage EXAM: CT ANGIOGRAPHY HEAD AND NECK TECHNIQUE: Multidetector CT imaging of the head and neck was performed using the standard protocol during bolus administration of intravenous contrast. Multiplanar CT image reconstructions and MIPs were obtained to evaluate the vascular anatomy. Carotid stenosis measurements (when applicable) are obtained utilizing NASCET criteria, using the distal internal carotid diameter as the denominator. CONTRAST:  59mL OMNIPAQUE IOHEXOL 350 MG/ML SOLN COMPARISON:  None. FINDINGS: CTA NECK Aortic arch: Great vessel origins are partially excluded. Visualized portions are patent. Right carotid system: Patent. Mild calcified plaque along the proximal internal carotid with minimal stenosis. Left carotid system: Minimal calcified plaque along the  proximal ICA without stenosis. The left ICA is smaller in caliber. Vertebral arteries: Patent.  Left vertebral artery is dominant. Skeleton: Cervical spine degenerative changes. Other neck: Unremarkable. Upper chest: Emphysema. Review of the MIP images confirms the above findings CTA HEAD Anterior circulation: Right intracranial internal carotid artery is patent with minor calcified plaque.  Majority of the intracranial left internal carotid artery is patent but smaller in caliber. There is likely occlusion at the terminus. Right middle and anterior cerebral arteries are patent. Duplicated anterior communicating arteries. There are numerous collateral vessels present on the left. A 2 mm bulbous area of enhancement is present within the inferior area of hemorrhage (series 6, image 103). This likely reflects culprit aneurysm. Posterior circulation: Intracranial vertebral arteries are patent. Basilar artery is patent. Major cerebellar artery origins are patent. Posterior cerebral arteries are patent. A left posterior communicating artery is present. Venous sinuses: Patent as allowed by contrast bolus timing. Review of the MIP images confirms the above findings IMPRESSION: Probable occlusion of the left carotid terminus with resulting moyamoya pattern of collaterals. A 2 mm presumed culprit aneurysm is present within the inferior area of hemorrhage. No hemodynamically significant stenosis in the neck. Emphysema. Electronically Signed   By: Macy Mis M.D.   On: 03/29/2021 14:21   CT Angio Neck W and/or Wo Contrast  Result Date: 03/03/2021 CLINICAL DATA:  Intracranial hemorrhage EXAM: CT ANGIOGRAPHY HEAD AND NECK TECHNIQUE: Multidetector CT imaging of the head and neck was performed using the standard protocol during bolus administration of intravenous contrast. Multiplanar CT image reconstructions and MIPs were obtained to evaluate the vascular anatomy. Carotid stenosis measurements (when applicable) are obtained  utilizing NASCET criteria, using the distal internal carotid diameter as the denominator. CONTRAST:  16mL OMNIPAQUE IOHEXOL 350 MG/ML SOLN COMPARISON:  None. FINDINGS: CTA NECK Aortic arch: Great vessel origins are partially excluded. Visualized portions are patent. Right carotid system: Patent. Mild calcified plaque along the proximal internal carotid with minimal stenosis. Left carotid system: Minimal calcified plaque along the proximal ICA without stenosis. The left ICA is smaller in caliber. Vertebral arteries: Patent.  Left vertebral artery is dominant. Skeleton: Cervical spine degenerative changes. Other neck: Unremarkable. Upper chest: Emphysema. Review of the MIP images confirms the above findings CTA HEAD Anterior circulation: Right intracranial internal carotid artery is patent with minor calcified plaque. Majority of the intracranial left internal carotid artery is patent but smaller in caliber. There is likely occlusion at the terminus. Right middle and anterior cerebral arteries are patent. Duplicated anterior communicating arteries. There are numerous collateral vessels present on the left. A 2 mm bulbous area of enhancement is present within the inferior area of hemorrhage (series 6, image 103). This likely reflects culprit aneurysm. Posterior circulation: Intracranial vertebral arteries are patent. Basilar artery is patent. Major cerebellar artery origins are patent. Posterior cerebral arteries are patent. A left posterior communicating artery is present. Venous sinuses: Patent as allowed by contrast bolus timing. Review of the MIP images confirms the above findings IMPRESSION: Probable occlusion of the left carotid terminus with resulting moyamoya pattern of collaterals. A 2 mm presumed culprit aneurysm is present within the inferior area of hemorrhage. No hemodynamically significant stenosis in the neck. Emphysema. Electronically Signed   By: Macy Mis M.D.   On: 03/23/2021 14:21   CT HEAD  CODE STROKE WO CONTRAST  Result Date: 03/20/2021 CLINICAL DATA:  Code stroke. EXAM: CT HEAD WITHOUT CONTRAST TECHNIQUE: Contiguous axial images were obtained from the base of the skull through the vertex without intravenous contrast. COMPARISON:  None. FINDINGS: Brain: Acute parenchymal hemorrhage in the left frontotemporal lobes measuring approximately 5.8 x 2.9 x 5.6 cm. Mild surrounding edema at this time. There is regional mass effect including effacement of the adjacent left lateral ventricle. There is mild rightward midline shift measuring approximately 5 mm. No  evidence of ventricle trapping. Small volume intraventricular extension is present. Gray-white differentiation is preserved. Vascular: No hyperdense vessel. Mild intracranial atherosclerotic calcification is present at the skull base. Skull: Unremarkable. Sinuses/Orbits: Aerated.  Orbits are unremarkable. Other: Mastoid air cells are clear. IMPRESSION: Acute parenchymal hemorrhage in the left frontotemporal lobes with small volume intraventricular extension. Regional mass effect including mild rightward midline shift. No hydrocephalus. These results were called by telephone at the time of interpretation on 03/26/2021 at 1:59 pm to provider JOSHUA LONG , who verbally acknowledged these results. Electronically Signed   By: Macy Mis M.D.   On: 03/26/2021 14:02    Assessment: 70 y.o. male with PMH of end-stage COPD on hospice care, hypertension, hyperlipidemia, former smoker admitted for right-sided weakness, slurred speech and aphasia. CT showed left frontal parietal subcortical moderate sized ICH, CT head and neck showed left terminal ICA occlusion with moyamoya pattern on the left, consistent with chronic occlusion. Currently on Cleviprex, just passed a swallow, on dysphagia 1 diet. Per wife, patient had end-stage he has already on hospice care, she is okay with CT, MRI and 2D echo as well as blood draw, however patient DNR, and no feeding  tube.    Pt mental status stable at this time and speech and right sided weakness showed improvement since yesterday per wife, will continue routine care for ICH, no escalation of care per wife's wishes.  We will do MRI brain, 2D echo, LDL and A1c.  We will start BP p.o. meds to taper off Cleviprex.  Continue PT/OT.  Plan: Continue regular [place] work up  Frequent neuro checks Telemetry monitoring MRI brain  Echocardiogram  CBC, BMP, fasting lipid panel and HgbA1C PT/OT pending.  Has a swallow, speech on board BP goal < 140 for now until MRI done showed stable ICH, then BP goal < 160.  Taper off Cleviprex as able, started p.o. BP meds with amlodipine and lisinopril. GI and DVT prophylaxis  No need nimodipine at this time Discussed with Dr. Christella Noa primary team We will follow   This patient is critically ill due to left frontotemporal subcortical ICH, cerebral edema, end-stage COPD and at significant risk of neurological worsening, death form cerebral edema, brain herniation, respiratory failure. This patient's care requires constant monitoring of vital signs, hemodynamics, respiratory and cardiac monitoring, review of multiple databases, neurological assessment, discussion with family, other specialists and medical decision making of high complexity. I spent 45 minutes of neurocritical care time in the care of this patient. I had long discussion with wife at bedside, updated pt current condition, treatment plan and potential prognosis, and answered all the questions.  She expressed understanding and appreciation.  I also discussed with Dr. Christella Noa attending physician.  Rosalin Hawking, MD PhD Stroke Neurology 03/05/2021 12:46 PM

## 2021-03-05 NOTE — Evaluation (Signed)
Physical Therapy Evaluation Patient Details Name: Jeffery [name] MRN: [id number] DOB: 25-Mar-1951 Today's Date: 03/05/2021  History of Present Illness  70 y/o male presented to ED on 11/3 for R sided weakness, neglect, and speech disturbance. CT head showed acute parenchymal hemorrhage in L frontotermporal lobes with small volume intraventricular extension. CTA showed 31mm presumed culprit aneurysm within inferior area of hemorrhage. No neurosurgical intervention due to current DNR and poor lung function. PMH: end stage COPD on home hospice, GERD, HLD, HTN, prediabetes  Clinical Impression  PTA, patient lives at home with wife but unsure of PLOF or home setup due to patient not answering questions. Patient following commands intermittently. Patient presents with R weakness, impaired balance, decreased activity tolerance, and impaired cognition. Patient with cachectic in appearance. Patient requires modA+2 to stand and maxA+2 to perform side steps but keeps trunk flexed throughout. Patient will benefit from skilled PT services during acute stay to address listed deficits. Recommend SNF at discharge to maximize functional mobility and decrease burden of care.      Recommendations for follow up therapy are one component of a multi-disciplinary discharge planning process, led by the attending physician.  Recommendations may be updated based on patient status, additional functional criteria and insurance authorization.  Follow Up Recommendations Skilled nursing-short term rehab (<3 hours/day)    Assistance Recommended at Discharge Frequent or constant Supervision/Assistance  Functional Status Assessment Patient has had a recent decline in their functional status and/or demonstrates limited ability to make significant improvements in function in a reasonable and predictable amount of time  Equipment Recommendations  Other (comment) (TBD)    Recommendations for Other Services       Precautions /  Restrictions Precautions Precautions: Fall Restrictions Weight Bearing Restrictions: No      Mobility  Bed Mobility Overal bed mobility: Needs Assistance Bed Mobility: Supine to Sit;Sit to Supine     Supine to sit: Mod assist Sit to supine: Mod assist   General bed mobility comments: modA for guiding LEs off bed and trunk elevation. Patient able to initiate return to supine but overall required modA to complete    Transfers Overall transfer level: Needs assistance Equipment used: 2 person hand held assist Transfers: Sit to/from Stand Sit to Stand: Mod assist;+2 physical assistance;+2 safety/equipment           General transfer comment: modA+2 to rise and steady but kept trunk flexed    Ambulation/Gait Ambulation/Gait assistance: Max assist;+2 physical assistance;+2 safety/equipment Gait Distance (Feet): 1 Feet Assistive device: 2 person hand held assist Gait Pattern/deviations: Step-to pattern     General Gait Details: 1 sidestep with trunk flexed and increasing with time in standing. MaxA+2 to complete and maintain balance  Stairs            Wheelchair Mobility    Modified Rankin (Stroke Patients Only) Modified Rankin (Stroke Patients Only) Pre-Morbid Rankin Score: Moderately severe disability Modified Rankin: Severe disability     Balance Overall balance assessment: Needs assistance Sitting-balance support: Bilateral upper extremity supported;Feet supported Sitting balance-Leahy Scale: Poor     Standing balance support: Bilateral upper extremity supported Standing balance-Leahy Scale: Poor                               Pertinent Vitals/Pain Pain Assessment: Faces Faces Pain Scale: No hurt Facial Expression: Relaxed, neutral Body Movements: Absence of movements Muscle Tension: Relaxed Compliance with ventilator (intubated pts.): N/A Vocalization (extubated pts.): Talking in  normal tone or no sound CPOT Total: 0 Pain  Intervention(s): Monitored during session    Home Living Family/patient expects to be discharged to:: Private residence Living Arrangements: Spouse/significant other                 Additional Comments: no family member present to provide PLOF or home setup. Patient not providing information    Prior Function Prior Level of Function : Other (comment)             Mobility Comments: unsure of mobility at baseline. On home O2 at baseline       Hand Dominance        Extremity/Trunk Assessment   Upper Extremity Assessment Upper Extremity Assessment: Defer to OT evaluation    Lower Extremity Assessment Lower Extremity Assessment: Generalized weakness    Cervical / Trunk Assessment Cervical / Trunk Assessment: Kyphotic  Communication   Communication: Expressive difficulties  Cognition Arousal/Alertness: Lethargic Behavior During Therapy: Flat affect Overall Cognitive Status: No family/caregiver present to determine baseline cognitive functioning                                 General Comments: following commands intermittently but not answering questions throughout. Kept eyes closed majority of session but will open on command        General Comments General comments (skin integrity, edema, etc.): On 3L O2 [place] with spO2 >90% throughout    Exercises     Assessment/Plan    PT Assessment Patient needs continued PT services  PT Problem List Decreased strength;Decreased activity tolerance;Decreased range of motion;Decreased balance;Decreased mobility;Decreased coordination;Decreased cognition;Decreased knowledge of use of DME;Decreased safety awareness;Decreased knowledge of precautions;Cardiopulmonary status limiting activity       PT Treatment Interventions DME instruction;Gait training;Functional mobility training;Therapeutic activities;Therapeutic exercise;Balance training;Neuromuscular re-education;Patient/family education    PT Goals  (Current goals can be found in the Care Plan section)  Acute Rehab PT Goals Patient Stated Goal: did not state PT Goal Formulation: Patient unable to participate in goal setting Time For Goal Achievement: 03/19/21 Potential to Achieve Goals: Poor    Frequency Min 3X/week   Barriers to discharge        Co-evaluation PT/OT/SLP Co-Evaluation/Treatment: Yes Reason for Co-Treatment: For patient/therapist safety;To address functional/ADL transfers PT goals addressed during session: Mobility/safety with mobility         AM-PAC PT "6 Clicks" Mobility  Outcome Measure Help needed turning from your back to your side while in a flat bed without using bedrails?: A Lot Help needed moving from lying on your back to sitting on the side of a flat bed without using bedrails?: A Lot Help needed moving to and from a bed to a chair (including a wheelchair)?: Total Help needed standing up from a chair using your arms (e.g., wheelchair or bedside chair)?: Total Help needed to walk in hospital room?: Total Help needed climbing 3-5 steps with a railing? : Total 6 Click Score: 8    End of Session Equipment Utilized During Treatment: Gait belt Activity Tolerance: Patient limited by fatigue Patient left: in bed;with call bell/phone within reach;with bed alarm set Nurse Communication: Mobility status PT Visit Diagnosis: Unsteadiness on feet (R26.81);Muscle weakness (generalized) (M62.81);Difficulty in walking, not elsewhere classified (R26.2)    Time: [id number] PT Time Calculation (min) (ACUTE ONLY): 13 min   Charges:   PT Evaluation $PT Eval Moderate Complexity: 1 Mod  Malcolm Hetz A. Gilford Rile PT, DPT Acute Rehabilitation Services Pager [phone] Office [phone]   Linna Hoff 03/05/2021, 1:47 PM

## 2021-03-05 NOTE — Progress Notes (Signed)
Occupational Therapy Evaluation  PTA pt lives at home with his wife and is on Hospice care for COPD per chart review. Pt states he was walking at home however unsure of PLOF. 3/4 DOE with minimal activity on 3L with SpO2 above 90. Pt attempting to stand once EOB and took 2 sdie stpes to L with +2 Max A. Intermittently following commands. Appears to have had a functional decline therefore recommend rehab at SNF. Recommend palliative care consult. Unsure if pt will continue with Hospice services at SNF. Will follow acutely.    03/05/21 1400  OT Visit Information  Last OT Received On 03/05/21  Assistance Needed +2  PT/OT/SLP Co-Evaluation/Treatment Yes  Reason for Co-Treatment Complexity of the patient's impairments (multi-system involvement);For patient/therapist safety;To address functional/ADL transfers  OT goals addressed during session ADL's and self-care  History of Present Illness 70 y/o male presented to ED on 11/3 for R sided weakness, neglect, and speech disturbance. CT head showed acute parenchymal hemorrhage in L frontotermporal lobes with small volume intraventricular extension. CTA showed 51mm presumed culprit aneurysm within inferior area of hemorrhage. No neurosurgical intervention due to current DNR and poor lung function. PMH: end stage COPD on home hospice, GERD, HLD, HTN, prediabetes  Precautions  Precautions Fall  Precaution Comments SOB with minimal activity  Home Living  Family/patient expects to be discharged to: Private residence  Living Arrangements Spouse/significant other  Available Help at Discharge Family  Type of [place] Access Level entry  [place] One level  Bathroom Shower/Tub Tub/shower unit  [place] seat  Additional Comments information from chart review; pt unable to give information  Prior Function  Prior Level of Function  Other (comment)  Mobility Comments unsure of mobility at baseline. On home O2 at baseline  ADLs Comments  Pt states he was walking; on Hospice Services for COPD PTA  Communication  Communication Expressive difficulties  Pain Assessment  Pain Assessment Faces  Faces Pain Scale 2  Pain Location general discomfort with breathing  Pain Descriptors / Indicators Grimacing  Pain Intervention(s) Limited activity within patient's tolerance  Cognition  Arousal/Alertness Lethargic  Behavior During Therapy Flat affect  Overall Cognitive Status No family/caregiver present to determine baseline cognitive functioning  General Comments following commands intermittently but not answering questions throughout. Kept eyes closed majority of session but will open on command  Upper Extremity Assessment  Upper Extremity Assessment RUE deficits/detail;LUE deficits/detail  RUE Deficits / Details moving spontaneously but overall weaker than L; nonfunctional grasp during assessment; quesiton if he has shoulder limitations at baseline  LUE Deficits / Details generalized weakness, but using funcitonally; ? decrease shoulder ORM at baseline  Lower Extremity Assessment  Lower Extremity Assessment Defer to PT evaluation  Cervical / Trunk Assessment  Cervical / Trunk Assessment Kyphotic (r biase)  Vision- Assessment  Additional Comments will further assess; eyes closed majority of session; states " I can't see"  Perception  Comments will assess; poor midline awareness; overshooting when reaching  ADL  Overall ADL's  Needs assistance/impaired  Eating/Feeding Total assistance  Grooming Maximal assistance  Upper Body Bathing Total assistance  Lower Body Bathing Total assistance  Upper Body Dressing  Total assistance  Lower Body Dressing Total assistance  Functional mobility during ADLs Maximal assistance;+2 for physical assistance  General ADL Comments limited by weakness and level of arousal; 3/4 DOE with minimal activity  Bed Mobility  Overal bed mobility Needs Assistance  Bed Mobility Supine to Sit;Sit to Supine   Supine to  sit Mod assist  Sit to supine Mod assist  General bed mobility comments modA for guiding LEs off bed and trunk elevation. Patient able to initiate return to supine but overall required modA to complete  Transfers  Overall transfer level Needs assistance  Equipment used 2 person hand held assist  Transfers Sit to/from Stand  Sit to Stand Mod assist;+2 physical assistance;+2 safety/equipment  General transfer comment modA+2 to rise and steady but kept trunk flexed  Balance  Overall balance assessment Needs assistance  Sitting-balance support Bilateral upper extremity supported;Feet supported  Sitting balance-Leahy Scale Poor  Standing balance support Bilateral upper extremity supported  Standing balance-Leahy Scale Poor  General Comments  General comments (skin integrity, edema, etc.) 3L; VSS  OT - End of Session  Equipment Utilized During Treatment Gait belt  Activity Tolerance Patient limited by lethargy;Patient limited by fatigue  Patient left in bed;Other (comment);with bed alarm set;with call bell/phone within reach (chair position)  Nurse Communication Mobility status  OT Assessment  OT Recommendation/Assessment Patient needs continued OT Services  OT Visit Diagnosis Unsteadiness on feet (R26.81);Other abnormalities of gait and mobility (R26.89);Muscle weakness (generalized) (M62.81);Other symptoms and signs involving the nervous system (R29.898);Other symptoms and signs involving cognitive function;Pain;Hemiplegia and hemiparesis  Hemiplegia - Right/Left Right  Hemiplegia - dominant/non-dominant Dominant  Hemiplegia - caused by Other Nontraumatic intracranial hemorrhage  Pain - part of body  (generalized)  OT Problem List Decreased strength;Decreased range of motion;Decreased activity tolerance;Impaired balance (sitting and/or standing);Impaired vision/perception;Decreased coordination;Decreased cognition;Decreased safety awareness;Decreased knowledge of use of DME or  AE;Cardiopulmonary status limiting activity;Impaired UE functional use;Pain  OT Plan  OT Frequency (ACUTE ONLY) Min 2X/week  OT Treatment/Interventions (ACUTE ONLY) Self-care/ADL training;Therapeutic exercise;Neuromuscular education;Energy conservation;DME and/or AE instruction;Therapeutic activities;Cognitive remediation/compensation;Visual/perceptual remediation/compensation;Patient/family education;Balance training  AM-PAC OT "6 Clicks" Daily Activity Outcome Measure (Version 2)  Help from another person eating meals? 2  Help from another person taking care of personal grooming? 2  Help from another person toileting, which includes using toliet, bedpan, or urinal? 1  Help from another person bathing (including washing, rinsing, drying)? 1  Help from another person to put on and taking off regular upper body clothing? 1  Help from another person to put on and taking off regular lower body clothing? 1  6 Click Score 8  Progressive Mobility  What is the highest level of mobility based on the progressive mobility assessment? Level 2 ([place]) - Balance while sitting on edge of bed and cannot stand  Mobility Sit up in bed/chair position for meals  OT Recommendation  Recommendations for Other Services Other (comment) (Palliative Care)  Follow Up Recommendations Skilled nursing-short term rehab (<3 hours/day)  Assistance recommended at discharge Frequent or constant Supervision/Assistance  Functional Status Assessent Patient has had a recent decline in their functional status and demonstrates the ability to make significant improvements in function in a reasonable and predictable amount of time.  OT Equipment Other (comment) (will assess)  Individuals Consulted  Consulted and Agree with Results and Recommendations Patient unable/family or caregiver not available  Acute Rehab OT Goals  Patient Stated Goal none stated  OT Goal Formulation Patient unable to participate in goal setting  Time  For Goal Achievement 03/19/21  Potential to Achieve Goals Fair  OT Time Calculation  OT Start Time (ACUTE ONLY) 1049  OT Stop Time (ACUTE ONLY) 1111  OT Time Calculation (min) 22 min  OT General Charges  $OT Visit 1 Visit  OT Evaluation  $OT Eval Moderate Complexity 1 Mod  Written Expression  Dominant Hand Right  Luisa Dago, OT/L   Acute OT Clinical Specialist Acute Rehabilitation Services Pager [phone] Office [phone]

## 2021-03-05 NOTE — Evaluation (Signed)
Clinical/Bedside Swallow Evaluation Patient Details  Name: Jeffery [name] MRN: [id number] Date of Birth: 1950-08-13  Today's Date: 03/05/2021 Time: SLP Start Time (ACUTE ONLY): 0906 SLP Stop Time (ACUTE ONLY): 0926 SLP Time Calculation (min) (ACUTE ONLY): 20 min  Past Medical History:  Past Medical History:  Diagnosis Date   Chest pain 04/2011   Normal echocardiogram   COPD (chronic obstructive pulmonary disease) (HCC)    Fasting hyperglycemia    Normal hemoglobin A1c of 6.0   GAD (generalized anxiety disorder)    GERD (gastroesophageal reflux disease)    Hyperlipemia    Hypertension    On home O2    2L N/C   On home O2    at night   Prediabetes 06/04/2015   Tobacco abuse    Past Surgical History:  Past Surgical History:  Procedure Laterality Date   APPENDECTOMY     APPENDECTOMY     CATARACT EXTRACTION W/PHACO Left 06/10/2019   Procedure: CATARACT EXTRACTION PHACO AND INTRAOCULAR LENS PLACEMENT (IOC);  Surgeon: Fabio Pierce, MD;  Location: [facility];  Service: Ophthalmology;  Laterality: Left;  CDE: 3.73   CATARACT EXTRACTION W/PHACO Right 06/24/2019   Procedure: CATARACT EXTRACTION PHACO AND INTRAOCULAR LENS PLACEMENT (IOC);  Surgeon: Fabio Pierce, MD;  Location: [facility];  Service: Ophthalmology;  Laterality: Right;  CDE: 4.42   LEFT HEART CATHETERIZATION WITH CORONARY ANGIOGRAM N/A 05/17/2011   Procedure: LEFT HEART CATHETERIZATION WITH CORONARY ANGIOGRAM;  Surgeon: Tonny Bollman, MD;  Location: [facility] CATH LAB;  Service: Cardiovascular;  Laterality: N/A;   HPI:  Jeffery [name] is a 70 y.o. admitted with the right side weakness, neglect, and speech disturbance. PMH:  end stage, COPD, currently under hospice care. CT Acute parenchymal hemorrhage in the left frontotemporal lobes with small volume intraventricular extension.    Assessment / Plan / Recommendation  Clinical Impression  Pt is currently under hospice care for end stage COPD. Wife reports pt has history of reflux  and intermittently complained of globus sensation but denied oropharyngeal dysphagia. He has several teeth but eats regular textures at baseline without difficulty. Today solid was not assessed given his lethargy, decreased reserved and endurance to masticate. He tolerated purees well and thin liquids via cup (does not like straws) with audible swallow. He was orally able to control thin boluses however suspect larger volumes may result in decrease control and airway compromise. Recommend small sips this, crush pills, puree texture Dys 1, allow extra time for swallow, upright posture (educated wife) and ST will follow up short term. SLP Visit Diagnosis: Dysphagia, unspecified (R13.10)    Aspiration Risk  Moderate aspiration risk    Diet Recommendation Dysphagia 1 (Puree);Thin liquid   Liquid Administration via: Cup (pt prefers cup) Medication Administration: Crushed with puree Supervision: Staff to assist with self feeding Compensations: Minimize environmental distractions;Slow rate Postural Changes: Seated upright at 90 degrees    Other  Recommendations Oral Care Recommendations: Oral care BID    Recommendations for follow up therapy are one component of a multi-disciplinary discharge planning process, led by the attending physician.  Recommendations may be updated based on patient status, additional functional criteria and insurance authorization.  Follow up Recommendations None      Frequency and Duration min 1 x/week  2 weeks       Prognosis Prognosis for Safe Diet Advancement: Guarded Barriers to Reach Goals: Severity of deficits      Swallow Study   General Date of Onset: 16-Mar-2021 HPI: Jeffery [name] is a  70 y.o. admitted with the right side weakness, neglect, and speech disturbance. PMH:  end stage, COPD, currently under hospice care. CT Acute parenchymal hemorrhage in the left frontotemporal lobes with small volume intraventricular extension. Type of Study: Bedside  Swallow Evaluation Previous Swallow Assessment: no Diet Prior to this Study: NPO Temperature Spikes Noted: No Respiratory Status: Nasal cannula History of Recent Intubation: No Behavior/Cognition: Lethargic/Drowsy;Cooperative;Requires cueing Oral Cavity Assessment: Within Functional Limits Oral Care Completed by SLP: No Oral Cavity - Dentition: Missing dentition Self-Feeding Abilities: Needs set up;Needs assist Baseline Vocal Quality: Low vocal intensity Volitional Cough:  (unable d/t decr strength) Volitional Swallow: Unable to elicit    Oral/Motor/Sensory Function Overall Oral Motor/Sensory Function: Generalized oral weakness   Ice Chips Ice chips: Within functional limits   Thin Liquid Thin Liquid: Impaired Presentation: Cup Oral Phase Impairments: Reduced labial seal Pharyngeal  Phase Impairments: Other (comments) (audible, decr coordination possibly)    Nectar Thick Nectar Thick Liquid: Not tested   Honey Thick Honey Thick Liquid: Not tested   Puree Puree: Within functional limits   Solid     Solid: Not tested      Royce Macadamia 03/05/2021,9:46 AM

## 2021-03-05 NOTE — Progress Notes (Signed)
Patient ID: Jeffery [name], male   DOB: 04-Mar-1951, 70 y.o.   MRN: [id number] MRI shows clot is slightly larger, with more mass effect. Mr. Knippenberg remains a do not operate. Will give dose of mannitol

## 2021-03-05 NOTE — Progress Notes (Signed)
Patient ID: Jeffery [name], male   DOB: [birth year], 70 y.o.   MRN: [id number] BP (!) 143/92 Comment: in MRI  Pulse 78   Temp 98 F (36.7 C) (Axillary)   Resp (!) 31   Ht 5\' 6"  (1.676 m)   Wt 48.6 kg   SpO2 100%   BMI 17.29 kg/m  Alert, following some commands No aneurysm identified. This was a non aneurysmal hemorrhage appreciate neurology. No surgical issues

## 2021-03-06 ENCOUNTER — Other Ambulatory Visit (HOSPITAL_COMMUNITY): Payer: Medicare Other

## 2021-03-06 DIAGNOSIS — R0602 Shortness of breath: Secondary | ICD-10-CM

## 2021-03-06 DIAGNOSIS — R52 Pain, unspecified: Secondary | ICD-10-CM

## 2021-03-06 DIAGNOSIS — I619 Nontraumatic intracerebral hemorrhage, unspecified: Secondary | ICD-10-CM

## 2021-03-06 DIAGNOSIS — Z515 Encounter for palliative care: Secondary | ICD-10-CM

## 2021-03-06 DIAGNOSIS — R6889 Other general symptoms and signs: Secondary | ICD-10-CM

## 2021-03-06 DIAGNOSIS — I610 Nontraumatic intracerebral hemorrhage in hemisphere, subcortical: Principal | ICD-10-CM

## 2021-03-06 DIAGNOSIS — Z7189 Other specified counseling: Secondary | ICD-10-CM

## 2021-03-06 LAB — LIPID PANEL
Cholesterol: 193 mg/dL (ref 0–200)
HDL: 78 mg/dL (ref >40)
LDL Cholesterol: 98 mg/dL (ref 0–99)
Total CHOL/HDL Ratio: 2.5 RATIO
Triglycerides: 83 mg/dL (ref <150)
VLDL: 17 mg/dL (ref 0–40)

## 2021-03-06 LAB — CBC
HCT: 34.6 % — ABNORMAL LOW (ref 39.0–52.0)
Hemoglobin: 11.2 g/dL — ABNORMAL LOW (ref 13.0–17.0)
MCH: 26.7 pg (ref 26.0–34.0)
MCHC: 32.4 g/dL (ref 30.0–36.0)
MCV: 82.4 fL (ref 80.0–100.0)
Platelets: 238 10*3/uL (ref 150–400)
RBC: 4.2 MIL/uL — ABNORMAL LOW (ref 4.22–5.81)
RDW: 15.9 % — ABNORMAL HIGH (ref 11.5–15.5)
WBC: 18.4 10*3/uL — ABNORMAL HIGH (ref 4.0–10.5)
nRBC: 0 % (ref 0.0–0.2)

## 2021-03-06 LAB — BASIC METABOLIC PANEL
Anion gap: 11 (ref 5–15)
BUN: 13 mg/dL (ref 8–23)
CO2: 31 mmol/L (ref 22–32)
Calcium: 9.3 mg/dL (ref 8.9–10.3)
Chloride: 89 mmol/L — ABNORMAL LOW (ref 98–111)
Creatinine, Ser: 0.76 mg/dL (ref 0.61–1.24)
GFR, Estimated: >60 mL/min (ref >60)
Glucose, Bld: 113 mg/dL — ABNORMAL HIGH (ref 70–99)
Potassium: 2.8 mmol/L — ABNORMAL LOW (ref 3.5–5.1)
Sodium: 131 mmol/L — ABNORMAL LOW (ref 135–145)

## 2021-03-06 LAB — HEMOGLOBIN A1C
Hgb A1c MFr Bld: 5.4 % (ref 4.8–5.6)
Mean Plasma Glucose: 108.28 mg/dL

## 2021-03-06 MED ORDER — HALOPERIDOL LACTATE 2 MG/ML PO CONC
0.5000 mg | ORAL | Status: DC | PRN
  Filled 2021-03-06: qty 0.3

## 2021-03-06 MED ORDER — GLYCOPYRROLATE 1 MG PO TABS
1.0000 mg | ORAL_TABLET | ORAL | Status: DC | PRN
  Filled 2021-03-06: qty 1

## 2021-03-06 MED ORDER — BIOTENE DRY MOUTH MT LIQD: 15.0000 mL | OROMUCOSAL | Status: DC | PRN

## 2021-03-06 MED ORDER — POLYVINYL ALCOHOL 1.4 % OP SOLN
1.0000 [drp] | Freq: Four times a day (QID) | OPHTHALMIC | Status: DC | PRN
  Filled 2021-03-06: qty 15

## 2021-03-06 MED ORDER — GLYCOPYRROLATE 0.2 MG/ML IJ SOLN
0.2000 mg | INTRAMUSCULAR | Status: DC | PRN
  Administered 2021-03-06: 0.2 mg via INTRAVENOUS
  Filled 2021-03-06: qty 1

## 2021-03-06 MED ORDER — HALOPERIDOL 0.5 MG PO TABS
0.5000 mg | ORAL_TABLET | ORAL | Status: DC | PRN
  Filled 2021-03-06: qty 1

## 2021-03-06 MED ORDER — ONDANSETRON HCL 4 MG/2ML IJ SOLN: 4.0000 mg | Freq: Four times a day (QID) | INTRAMUSCULAR | Status: DC | PRN

## 2021-03-06 MED ORDER — ACETAMINOPHEN 650 MG RE SUPP: 650.0000 mg | Freq: Four times a day (QID) | RECTAL | Status: DC | PRN

## 2021-03-06 MED ORDER — POTASSIUM CHLORIDE 10 MEQ/100ML IV SOLN: 10.0000 meq | INTRAVENOUS | Status: DC

## 2021-03-06 MED ORDER — MORPHINE BOLUS VIA INFUSION
2.0000 mg | INTRAVENOUS | Status: DC | PRN
  Filled 2021-03-06: qty 2

## 2021-03-06 MED ORDER — ACETAMINOPHEN 325 MG PO TABS: 650.0000 mg | ORAL_TABLET | Freq: Four times a day (QID) | ORAL | Status: DC | PRN

## 2021-03-06 MED ORDER — GLYCOPYRROLATE 0.2 MG/ML IJ SOLN: 0.2000 mg | INTRAMUSCULAR | Status: DC | PRN

## 2021-03-06 MED ORDER — ONDANSETRON 4 MG PO TBDP: 4.0000 mg | ORAL_TABLET | Freq: Four times a day (QID) | ORAL | Status: DC | PRN

## 2021-03-06 MED ORDER — HALOPERIDOL LACTATE 5 MG/ML IJ SOLN: 0.5000 mg | INTRAMUSCULAR | Status: DC | PRN

## 2021-03-06 MED ORDER — MORPHINE 100MG IN NS 100ML (1MG/ML) PREMIX INFUSION
5.0000 mg/h | INTRAVENOUS | Status: DC
  Administered 2021-03-06: 5 mg/h via INTRAVENOUS
  Filled 2021-03-06: qty 100

## 2021-04-01 NOTE — Progress Notes (Signed)
STROKE TEAM PROGRESS NOTE   INTERVAL HISTORY Patient is seen in his room with various family members at the bedside.  He was admitted on 11/3 with a frontotemporal ICH.  His condition has worsened with expansion of the ICH, and his family elected to place him on comfort care.  He appears to be resting comfortably at this time.  Vitals:   03/29/2021 1000 03/16/2021 1100 03/12/2021 1200 03/08/2021 1236  BP:      Pulse: (!) 112 (!) 112 (!) 121   Resp: 17 15 12    Temp:      TempSrc:      SpO2: (!) 73% (!) 66% (!) 66%   Weight:    48.6 kg  Height:    5\' 6"  (1.676 m)   CBC:  Recent Labs  Lab 2021-03-21 1413 Mar 21, 2021 1435 21-Mar-2021 1910 03/05/2021 0320  WBC 8.0  --  13.3* 18.4*  NEUTROABS 6.6  --   --   --   HGB 10.6*   < > 11.4* 11.2*  HCT 35.2*   < > 36.4* 34.6*  MCV 89.3  --  86.1 82.4  PLT 234  --  258 238   < > = values in this interval not displayed.   Basic Metabolic Panel:  Recent Labs  Lab 21-Mar-2021 1413 03/21/2021 1435 03/21/2021 0320  NA 138 137 131*  K 3.7 3.6 2.8*  CL 95* 91* 89*  CO2 36*  --  31  GLUCOSE 107* 101* 113*  BUN 15 14 13   CREATININE 0.62 0.70 0.76  CALCIUM 8.9  --  9.3   Lipid Panel:  Recent Labs  Lab 03/09/2021 0320  CHOL 193  TRIG 83  HDL 78  CHOLHDL 2.5  VLDL 17  LDLCALC 98   HgbA1c:  Recent Labs  Lab 03/26/2021 0320  HGBA1C 5.4   Urine Drug Screen:  Recent Labs  Lab 03-21-2021 1446  LABOPIA NONE DETECTED  COCAINSCRNUR NONE DETECTED  LABBENZ NONE DETECTED  AMPHETMU NONE DETECTED  THCU NONE DETECTED  LABBARB NONE DETECTED    Alcohol Level  Recent Labs  Lab 21-Mar-2021 1413  ETH <10    IMAGING past 24 hours MR BRAIN WO CONTRAST  Result Date: 03/05/2021 CLINICAL DATA:  Cerebral hemorrhage EXAM: MRI HEAD WITHOUT CONTRAST TECHNIQUE: Multiplanar, multiecho pulse sequences of the brain and surrounding structures were obtained without intravenous contrast. COMPARISON:  None. FINDINGS: Evaluation is somewhat limited by motion artifact. Brain:  Redemonstrated intraparenchymal hemorrhage in the left frontal and temporal lobes and left basal ganglia, measuring up to 7.7 x 3.4 x 5.6 cm (series 5, image 13 and 15 and series 10, image 18), previously up to 5.8 x 3.3 x 5.3 cm, with increased extension posteriorly towards the atrium of the left lateral ventricle. The hemorrhage is T1 isointense and T2 hyper and hypointense, likely mixture of hyperacute and acute blood products. There is significant surrounding edema and approximately 10 mm of right-to-left midline shift with subfalcine herniation (series 10, image 18), previously 6 mm on 03/21/21. Effacement of the left ambient cistern, with mass effect on the left midbrain. Moderate amount of hemorrhage layering in the occipital horn of the left lateral ventricle, with trace hemorrhage in the occipital horn of the right lateral ventricle, as well as within the third and fourth ventricles. There is mass effect on the body of the left greater than right lateral ventricle, as well as third ventricle. No remote area of restricted diffusion to suggest additional acute infarct. Vascular: Normal right flow  voids. Left MCA flow voids are not visualized. Skull and upper cervical spine: Normal marrow signal. Sinuses/Orbits: Negative.  Status post bilateral lens replacements. Other: Trace fluid in the mastoid air cells. IMPRESSION: 1. Redemonstrated intraparenchymal hemorrhage, likely acute and hyperacute) in the left frontal and temporal lobes and left basal ganglia, which appears to have increased in size, with increased midline shift, now approximately 10 mm, with subfalcine herniation. There is now effacement of the left ambient cistern with mass effect on the left midbrain. 2. Redemonstrated mass effect on the lateral ventricles and third ventricle, with hemorrhage seen throughout the ventricles. These results were called by telephone at the time of interpretation on 03/05/2021 at 6:46 pm to provider DR. CABELL , who  verbally acknowledged these results. Electronically Signed   By: Merilyn Baba M.D.   On: 03/05/2021 18:47    PHYSICAL EXAM General:  Patient is a thin appearing male in no acute distress. Non verbal. Pin point pupils with no reactivity.  Did not follow commands.   ASSESSMENT/PLAN [name] is a 70 y.o. male with history of end-stage COPD, HTN, HLD and smoking presenting with left frontal and temporal ICH. He was being followed by hospice at home and has recently been transitioned to comfort care.  He is in no apparent distress at this time.  We will sign off but will remain available for any questions or concerns.  ICH:  left frontal and temporal infarct secondary to unknown etiology Code Stroke CT head Acute IPH in frontotemporal lobes with mass effect and mild midline shift CTA head & neck Probable occlusion of left carotid terminus with moyamoya pattern and 20mm aneurysm within area of hemorrhage MRI  left frontotemporal IPH with 19mm midline shift and subfalcine herniation, mass effect on lateral and third ventricles with hemorrhage seen in ventricles LDL 98 HgbA1c 5.4 VTE prophylaxis - N/A, patient on comfort care    Diet   DIET - DYS 1 Room service appropriate? Yes; Fluid consistency: Thin   aspirin 81 mg daily prior to admission, now on No antithrombotic secondary to IPH Therapy recommendations:  N/A Disposition:  comfort care  Hypertension Home meds:  none Patient on comfort care, no BP goal necessary  Hyperlipidemia Home meds:  atorvastatin 10 mg daily, held secondary to patient being on comfort care LDL 98, goal < 70    Other Stroke Risk Factors Advanced Age >/= 44  Former cigarette smoker   Hospital day # [place], NP   ATTENDING ATTESTATION:  Dr. Reeves Forth evaluated pt independently, reviewed imaging, chart, labs. Discussed and formulated plan with the APP. Please see APP note above for details.     I spent 35 minutes of time in the  care of this patient.   Jeffery Lagasse,MD   To contact Stroke Continuity provider, please refer to [email or web address] After hours, contact General Neurology

## 2021-04-01 NOTE — Consult Note (Signed)
Palliative Care Consult Note                                  Date: 18-Mar-2021   Patient Name: Jeffery [name]  DOB: 25-Jan-1951  MRN: [id number]  Age / Sex: 70 y.o., male  PCP: Neale Burly, MD Referring Physician: Ashok Pall, MD  Reason for Consultation: Terminal Care  HPI/Patient Profile: 70 y.o. male  with past medical history of end-stage COPD on hospice care, hypertension, hyperlipidemia, former smoker admitted for [place].  Per wife, patient was at his baseline yesterday 11 AM on the phone.  When she got home around 12:45 PM and patient was found to have right-sided weakness, slurred speech and aphasia.  EMS was called and he was brought to [facility] for evaluation.  BP 152/86, glucose 104.  CT showed left frontal parietal subcortical moderate sized ICH, CT head and neck showed left terminal ICA occlusion with moyamoya pattern on the left, consistent with chronic occlusion. He was admitted on 03/17/2021 with intracranial hemorrhage.  Deemed no surgical options.  Started on mannitol.  MRI showed enlarging clot.  The patient and family decided to transition to comfort care.  PMT was consulted for end-of-life care.  Past Medical History:  Diagnosis Date   Chest pain 04/2011   Normal echocardiogram   COPD (chronic obstructive pulmonary disease) (HCC)    Fasting hyperglycemia    Normal hemoglobin A1c of 6.0   GAD (generalized anxiety disorder)    GERD (gastroesophageal reflux disease)    Hyperlipemia    Hypertension    On home O2    2L N/C   On home O2    at night   Prediabetes 06/04/2015   Tobacco abuse     Subjective:   This NP Walden Field reviewed medical records, received report from team, assessed the patient and then meet at the patient's bedside to discuss diagnosis, prognosis, GOC, EOL wishes disposition and options.  I met with the patient who is not able to participate.  At his bedside were 3 relatives including his wife, his  wife's twin sister, and another unknown relative.   Concept of Palliative Care was introduced as specialized medical care for people and their families living with serious illness.  If focuses on providing relief from the symptoms and stress of a serious illness.  The goal is to improve quality of life for both the patient and the family. Values and goals of care important to patient and family were attempted to be elicited.  Created space and opportunity for patient  and family to explore thoughts and feelings regarding current medical situation   Natural trajectory and current clinical status were discussed. Questions and concerns addressed. Patient  encouraged to call with questions or concerns.    Today's Discussion: We discussed that the patient was already on home hospice for end-stage COPD.  They are at peace with knowing that he is end-of-life.  Their primary concern is that he not suffer in his final days.  We discussed comfort care philosophy and logistics.  His wife had previously asked that his oxygen remain on because there are family members who would be distressed at it not being on.  However, she knows that this could cause him to linger and she seems to be a conflict with this decision.  I discussed turning the flow of oxygen off while allowing the nasal cannula to remain on to  help family members be at peace.  She liked this idea and consented to it and the oxygen was subsequently turned to off.  Notified the nurse.  I allowed time to reminisce and share stories, discussed the patient and their close family relationship.  They indicated that several other family members would be coming to visit today.  However, I shared that we would continue to focus on his comfort regardless of their timeframe and his wife agrees.  Unfortunately, her twin sister recently lost her husband in July and I was involved in his end-of-life care as well.  The patient's wife asked questions about the  process of dying, what it would look like, and what they could expect.  I provided a copy of the booklet "gone from my sight: The dying experience" and reviewed with the final hours generally look like.  She did share that at this time she feels he is comfortable and this makes her happy.  We shared their joint faith in God and her conviction that he will be going to heaven.  She discusses "he ran his race and now it is time for the victory party."  I provided emotional and general support through therapeutic listening, sharing of stories, and other measures.  I answered all her questions and addressed all her concerns.  Review of Systems  Unable to perform ROS: Patient unresponsive   Objective:   Primary Diagnoses: Present on Admission:  ICH (intracerebral hemorrhage) ([place])  Intracerebral hematoma ([place])   Physical Exam Vitals and nursing note reviewed.  Constitutional:      General: He is not in acute distress.    Appearance: He is ill-appearing.  HENT:     Head: Normocephalic and atraumatic.  Cardiovascular:     Rate and Rhythm: Normal rate.  Pulmonary:     Effort: Pulmonary effort is normal. No respiratory distress.  Abdominal:     General: Abdomen is flat.     Palpations: Abdomen is soft.  Skin:    General: Skin is warm and dry.  Neurological:     Mental Status: He is unresponsive.    Vital Signs:  BP 108/71   Pulse (!) 151   Temp 98.7 F (37.1 C) (Oral)   Resp (!) 35   Ht '5\' 6"'  (1.676 m)   Wt 48.6 kg   SpO2 (!) 76%   BMI 17.29 kg/m   Palliative Assessment/Data: 10%    Advanced Care Planning:   Primary Decision Maker: NEXT OF KIN  Code Status/Advance Care Planning: DNR  A discussion was had today regarding advanced directives. Concepts specific to code status, artifical feeding and hydration, continued IV antibiotics and rehospitalization was had.  The difference between a aggressive medical intervention path and a palliative comfort care path for this  patient at this time was had.  Decisions/Changes to ACP: Remain DNR Remain on comfort care  Assessment & Plan:   Impression: 70 year old male already on home hospice for end-stage COPD now with a hemorrhagic stroke in his final hours.  He has been made comfort care by the primary team we are asked to follow-up.  He appears comfortable at this time.  I have discontinued multiple medical orders not related to comfort.  I have changed his morphine drip from 5 mg to our to a range of 5 to 10 mg/h to allow titration based on patient comfort. The patient's family seems to be at peace with their decisions.  Educated on the final hours of life and what to  expect.  Anticipate hospital death within hours to days.  He does not look appropriate/stable for transfer to a residential hospice facility at this time.  SUMMARY OF RECOMMENDATIONS   Remain DNR Remain comfort care Increase morphine drip to a range of 5 to 10 mg/h based on comfort Continue other comfort meds already ordered (Robinul) Turn off flow of oxygen but allow the nasal cannula to remain in place for extended family comfort/acceptance Continued holistic family support Notified [facility] hospice and palliative care of patient's admission likely hospital death PMT will continue to follow  Symptom Management:  Morphine 5 to 10 mg/h as needed pain or respiratory distress Robinul 0.2 mg every 4 hours as needed excessive secretions Haldol 0.5 mg every 4 hours as needed agitation Zofran 4 mg every 6 hours as needed nausea or vomiting Further orders are available as needed  Prognosis:  Hours - Days  Discharge Planning:  Anticipated Hospital Death   Discussed with: Medical team, nursing team, patient's family    Thank you for allowing [place] to participate in the care of Jeffery [name] PMT will continue to support holistically.  Time Total: 70 min  Greater than 50%  of this time was spent counseling and coordinating care  related to the above assessment and plan.  Signed by: Walden Field, NP Palliative Medicine Team  Team Phone # [phone] (Nights/Weekends)  2021-03-19, 9:39 AM

## 2021-04-01 NOTE — Discharge Summary (Addendum)
Physician Discharge Summary  Patient ID: Jeffery [name] MRN: [id number] DOB/AGE: [birth year] 70 y.o.  Admit date: 2021-03-28 Discharge date: 03/14/2021  Admission Diagnoses:  Discharge Diagnoses:  Active Problems:   ICH (intracerebral hemorrhage) (HCC)   Intracerebral hematoma [facility])   Discharged Condition: Deceased  Hospital Course: Patient admitted for evaluation of hypertensive basal ganglia hemorrhage.  Patient with multiple medical comorbidities.  Work-up was negative for any significant intracranial vascular disease.  The patient's situation continue to decline.  The patient's family made it known that they wished him to be DNR.  No further aggressive measures were made.  Patient was placed on comfort care and gradually passed.  Consults:   Significant Diagnostic Studies:   Treatments:   Discharge Exam: Blood pressure 108/71, pulse (!) 121, temperature 98.7 F (37.1 C), temperature source Oral, resp. rate 12, height 5\' 6"  (1.676 m), weight 48.6 kg, SpO2 (!) 66 %. Deceased  Disposition:    Allergies as of 03/14/2021       Reactions   Codeine Nausea Only        Medication List     ASK your doctor about these medications    albuterol 108 (90 Base) MCG/ACT inhaler Commonly known as: ProAir HFA Inhale 1-2 puffs into the lungs every 4 (four) hours as needed.   albuterol (2.5 MG/3ML) 0.083% nebulizer solution Commonly known as: PROVENTIL Take 3 mLs (2.5 mg total) by nebulization every 4 (four) hours as needed for wheezing or shortness of breath.   ALPRAZolam 0.5 MG tablet Commonly known as: XANAX Take 0.5 mg by mouth 4 (four) times daily. Takes 1/2 tab in the morning and 1 tablet at bedtime   ALPRAZolam 0.5 MG tablet Commonly known as: XANAX Take by mouth.   aspirin EC 81 MG tablet Take 81 mg by mouth daily.   aspirin 81 MG EC tablet Take by mouth.   atorvastatin 10 MG tablet Commonly known as: LIPITOR Take 1 tablet (10 mg total) by mouth at  bedtime.   Breztri Aerosphere 160-9-4.8 MCG/ACT Aero Generic drug: Budeson-Glycopyrrol-Formoterol Inhale into the lungs 2 (two) times daily.   famotidine 40 MG tablet Commonly known as: PEPCID 1 daily after supper   famotidine 40 MG tablet Commonly known as: PEPCID Take by mouth.   furosemide 20 MG tablet Commonly known as: LASIX Take 10 mg by mouth daily.   gabapentin 300 MG capsule Commonly known as: NEURONTIN Take 300 mg by mouth 3 (three) times daily.   gabapentin 300 MG capsule Commonly known as: NEURONTIN Take by mouth.   ipratropium-albuterol 0.5-2.5 (3) MG/3ML Soln Commonly known as: DUONEB Take 3 mLs by nebulization every 6 (six) hours as needed.   isosorbide mononitrate 30 MG 24 hr tablet Commonly known as: IMDUR Take 30 mg by mouth daily.   linaclotide 145 MCG Caps capsule Commonly known as: LINZESS Take 145 mcg by mouth daily before breakfast.   LORazepam 1 MG tablet Commonly known as: ATIVAN Take 1 mg by mouth 3 (three) times daily.   Matzim LA 240 MG 24 hr tablet Generic drug: diltiazem Take 240 mg by mouth daily.   metFORMIN 500 MG tablet Commonly known as: GLUCOPHAGE Take 500 mg by mouth daily.   metFORMIN 500 MG tablet Commonly known as: GLUCOPHAGE Take by mouth.   mirtazapine 15 MG tablet Commonly known as: REMERON Take 7.5 mg by mouth at bedtime.   mirtazapine 15 MG tablet Commonly known as: REMERON Take by mouth.   morphine 20 MG/5ML solution Take 0.1-0.25 mg  by mouth every 2 (two) hours as needed for pain.   Multi-Vitamins Tabs Take by mouth.   multivitamin with minerals Tabs tablet Take 1 tablet by mouth daily.   omeprazole 20 MG capsule Commonly known as: PRILOSEC Take 20 mg by mouth daily.   omeprazole 20 MG capsule Commonly known as: PRILOSEC Take by mouth.   ondansetron 4 MG tablet Commonly known as: ZOFRAN Take 4 mg by mouth every 8 (eight) hours as needed for nausea or vomiting.   ondansetron 4 MG  tablet Commonly known as: ZOFRAN Take by mouth.   OXYGEN Inhale 3 L into the lungs daily. 2lpm 24/7   polyethylene glycol 17 g packet Commonly known as: MIRALAX / GLYCOLAX Take 17 g by mouth daily.   Potassium Chloride ER 20 MEQ Tbcr Take by mouth.   potassium chloride SA 20 MEQ tablet Commonly known as: KLOR-CON Take 20 mEq by mouth daily.   predniSONE 5 MG tablet Commonly known as: DELTASONE Take 1 tablet (5 mg total) by mouth daily with breakfast. Start taking it again after completion of acute tapering prescription.   rosuvastatin 5 MG tablet Commonly known as: CRESTOR Take 5 mg by mouth daily.   senna 8.6 MG Tabs tablet Commonly known as: SENOKOT Take 1 tablet by mouth daily as needed for mild constipation.   simvastatin 20 MG tablet Commonly known as: ZOCOR Take 20 mg by mouth at bedtime.   sucralfate 1 g tablet Commonly known as: CARAFATE Take 1 g by mouth 2 (two) times daily.   telmisartan 40 MG tablet Commonly known as: MICARDIS TAKE 1 TABLET BY MOUTH ONCE A DAY.   Trelegy Ellipta 100-62.5-25 MCG/ACT Aepb Generic drug: Fluticasone-Umeclidin-Vilant INHALE 1 PUFF INTO THE LUNGS EACH MORNING.         Signed: Kathaleen Maser Nastashia Gallo 03/08/2021, 3:17 PM

## 2021-04-01 NOTE — Progress Notes (Signed)
Patient continues to decline neurologically.  No evidence of awakening.  Breathing more labored.  Patient family has decided to move forward with comfort care.  Patient DNR.  Palliative care involved.

## 2021-04-01 NOTE — Progress Notes (Signed)
Patient's Cardiac time of death 1230pm. Family at bedside. Emotional support given. MD made aware. [facility] called. Reference [phone]. Family has left with patient's belongins including his wedding band and glasses. Family very thankful for our care. Jemeka Wagler, [place] Scrape, RN

## 2021-04-01 NOTE — Progress Notes (Signed)
Patient's wife and another family member at the bedside. Comfort measures were explained and family wants to proceed. Morphine gtt started and NRB removed. Family requested the nasal cannula stay on for now. Potassium runs were denied. All other medications discontinued. Will monitor quietly and offer emotional support. Ladaysha Soutar, [place] Scrape, RN

## 2021-04-01 NOTE — Progress Notes (Signed)
0600: pt with tachypnea desatting into 80s despite being placed on non rebreather at 15L. Heart rate in 140 s and patient not following commands. Patient family at bedside. This RN explained to family that we will not attempt any resuscitating interventions due to patients DNR status. Notified [place] of neurosurgery about pts current status and let family ask the provider questions. Family expressed wishes to make pt comfortable. Awaiting new orders from neurosurgery.

## 2021-04-01 DEATH — deceased
